# Patient Record
Sex: Male | Born: 1960 | Race: White | Hispanic: No | Marital: Married | Smoking: Former smoker
Health system: Southern US, Community
[De-identification: ages and names within clinical notes are randomized; demographics above are authoritative.]

## PROBLEM LIST (undated history)

## (undated) DIAGNOSIS — I639 Cerebral infarction, unspecified: Secondary | ICD-10-CM

## (undated) DIAGNOSIS — E119 Type 2 diabetes mellitus without complications: Secondary | ICD-10-CM

## (undated) DIAGNOSIS — I2510 Atherosclerotic heart disease of native coronary artery without angina pectoris: Secondary | ICD-10-CM

## (undated) DIAGNOSIS — I519 Heart disease, unspecified: Secondary | ICD-10-CM

## (undated) DIAGNOSIS — C8580 Other specified types of non-Hodgkin lymphoma, unspecified site: Secondary | ICD-10-CM

## (undated) DIAGNOSIS — B1920 Unspecified viral hepatitis C without hepatic coma: Secondary | ICD-10-CM

## (undated) DIAGNOSIS — I4891 Unspecified atrial fibrillation: Secondary | ICD-10-CM

## (undated) DIAGNOSIS — R9431 Abnormal electrocardiogram [ECG] [EKG]: Secondary | ICD-10-CM

## (undated) DIAGNOSIS — R9439 Abnormal result of other cardiovascular function study: Secondary | ICD-10-CM

## (undated) DIAGNOSIS — I10 Essential (primary) hypertension: Secondary | ICD-10-CM

## (undated) DIAGNOSIS — G629 Polyneuropathy, unspecified: Secondary | ICD-10-CM

## (undated) DIAGNOSIS — E785 Hyperlipidemia, unspecified: Secondary | ICD-10-CM

## (undated) HISTORY — PX: TONSILLECTOMY AND ADENOIDECTOMY: SUR1326

## (undated) HISTORY — DX: Essential (primary) hypertension: I10

## (undated) HISTORY — PX: APPENDECTOMY: SHX54

## (undated) HISTORY — DX: Abnormal result of other cardiovascular function study: R94.39

## (undated) HISTORY — DX: Unspecified atrial fibrillation: I48.91

## (undated) HISTORY — DX: Cerebral infarction, unspecified: I63.9

---
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
## 2011-05-31 ENCOUNTER — Emergency Department: Payer: Self-pay | Admitting: Emergency Medicine

## 2011-05-31 ENCOUNTER — Ambulatory Visit: Payer: Self-pay | Admitting: General Practice

## 2011-06-01 ENCOUNTER — Ambulatory Visit: Payer: Self-pay | Admitting: General Practice

## 2013-10-25 ENCOUNTER — Emergency Department: Payer: Self-pay | Admitting: Emergency Medicine

## 2013-10-25 LAB — COMPREHENSIVE METABOLIC PANEL
Alkaline Phosphatase: 82 U/L (ref 50–136)
Anion Gap: 5 — ABNORMAL LOW (ref 7–16)
BUN: 20 mg/dL — ABNORMAL HIGH (ref 7–18)
Co2: 27 mmol/L (ref 21–32)
EGFR (Non-African Amer.): 56 — ABNORMAL LOW
Glucose: 182 mg/dL — ABNORMAL HIGH (ref 65–99)
SGOT(AST): 32 U/L (ref 15–37)
SGPT (ALT): 40 U/L (ref 12–78)
Sodium: 138 mmol/L (ref 136–145)
Total Protein: 7.3 g/dL (ref 6.4–8.2)

## 2013-10-25 LAB — CBC
HGB: 15.3 g/dL (ref 13.0–18.0)
MCHC: 34.7 g/dL (ref 32.0–36.0)
RBC: 5.02 10*6/uL (ref 4.40–5.90)
RDW: 12.6 % (ref 11.5–14.5)
WBC: 8.7 10*3/uL (ref 3.8–10.6)

## 2013-10-25 LAB — URINALYSIS, COMPLETE
Bilirubin,UR: NEGATIVE
Blood: NEGATIVE
Glucose,UR: 50 mg/dL (ref 0–75)
Leukocyte Esterase: NEGATIVE
Nitrite: NEGATIVE
Ph: 5 (ref 4.5–8.0)
Protein: NEGATIVE
RBC,UR: NONE SEEN /HPF (ref 0–5)
Specific Gravity: 1.019 (ref 1.003–1.030)
WBC UR: 1 /HPF (ref 0–5)

## 2014-09-27 ENCOUNTER — Ambulatory Visit: Payer: Self-pay | Admitting: General Practice

## 2015-02-05 IMAGING — US ABDOMEN ULTRASOUND
1 series · 14 of 25 positions shown · non-contrast
Comparison: CT scan of October 25, 2013.

CLINICAL DATA: Acute right upper quadrant abdominal pain.
Hepatitis-C.

EXAM:
ULTRASOUND ABDOMEN COMPLETE

[Series 1: abdomen ultrasound · 0.33mm/px · 14 of 91 slices shown]
[im 1/91]
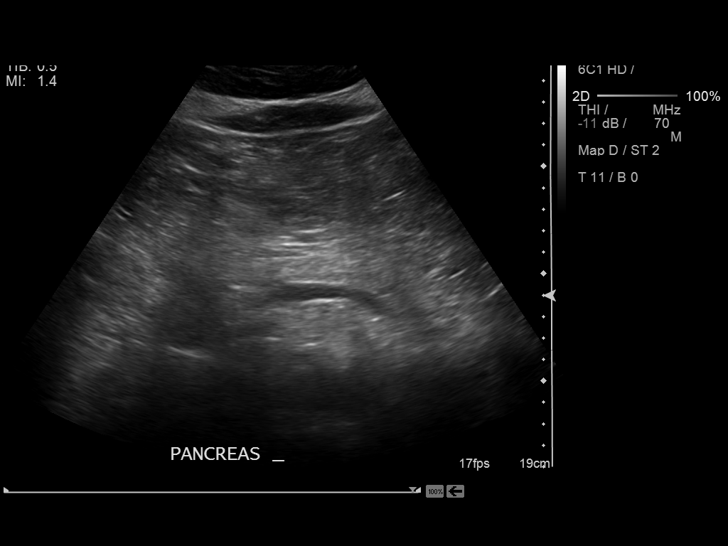
[im 8/91]
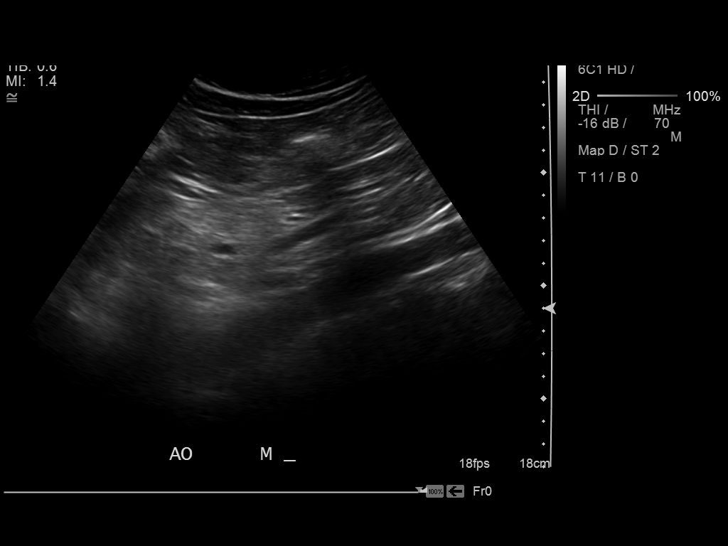
[im 16/91]
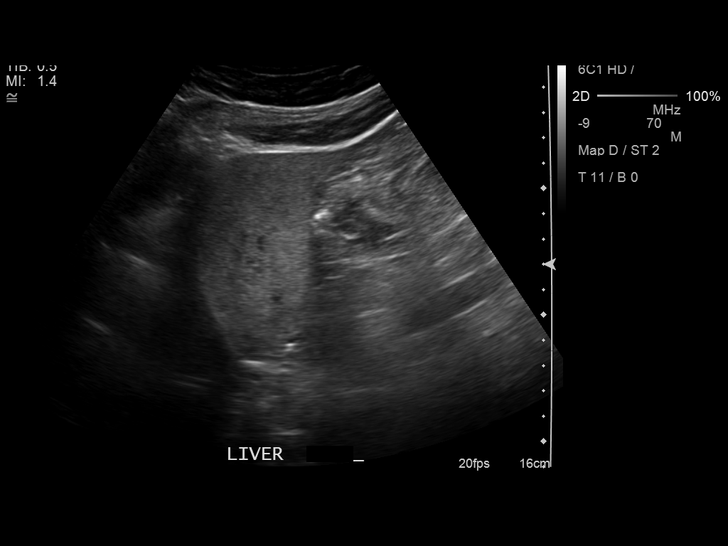
[im 23/91]
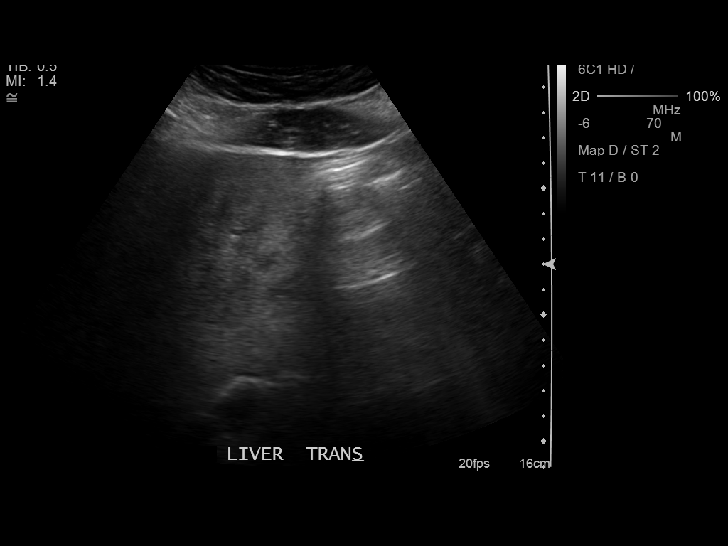
[im 31/91]
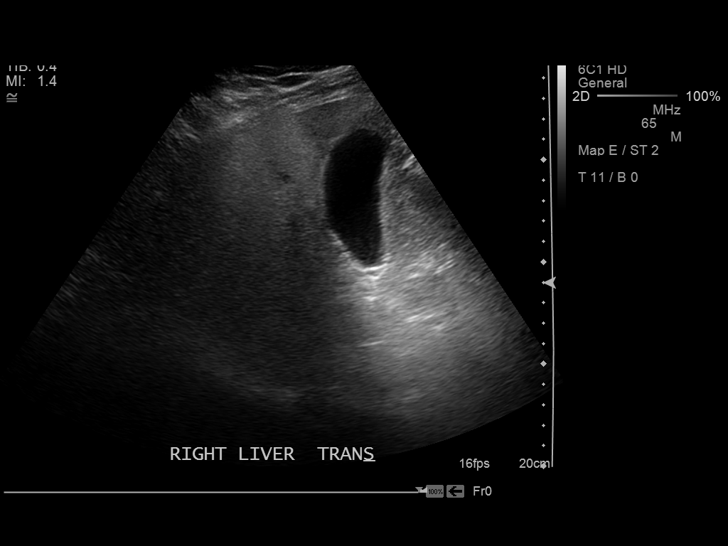
[im 34/91]
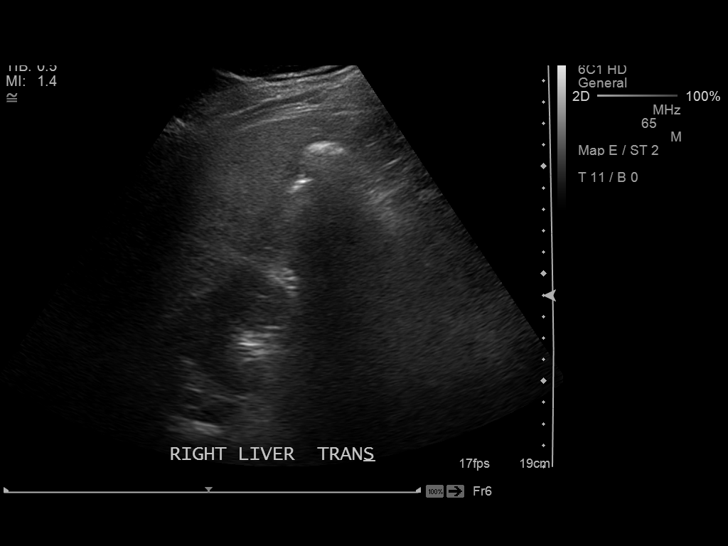
[im 42/91]
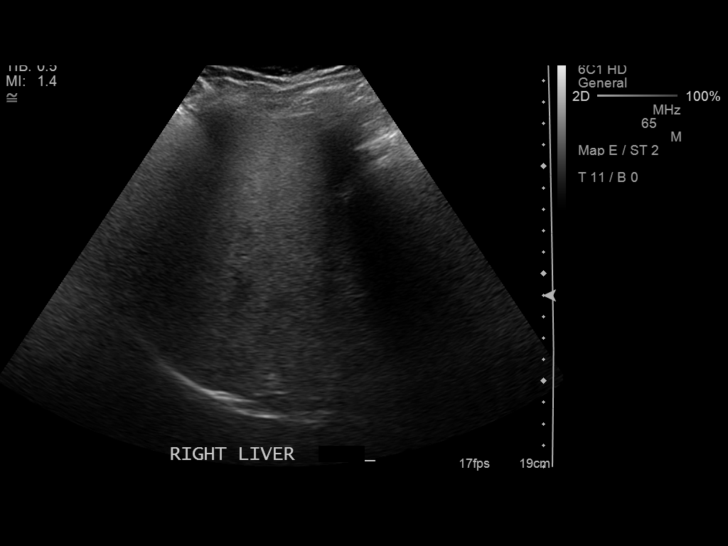
[im 49/91]
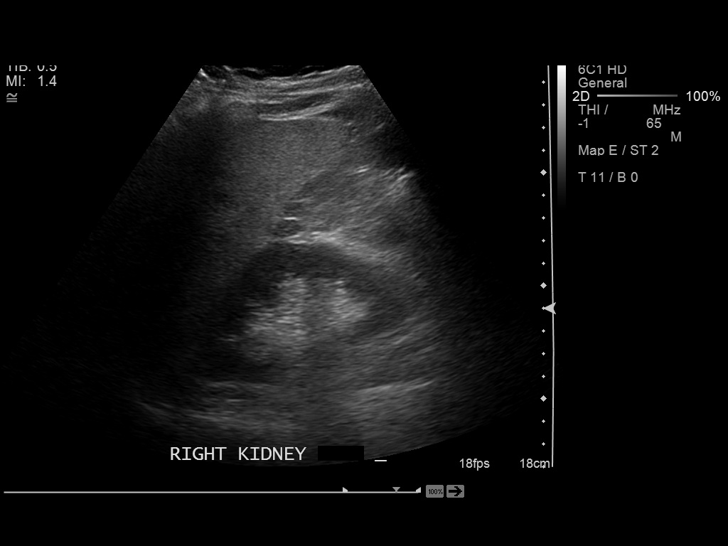
[im 57/91]
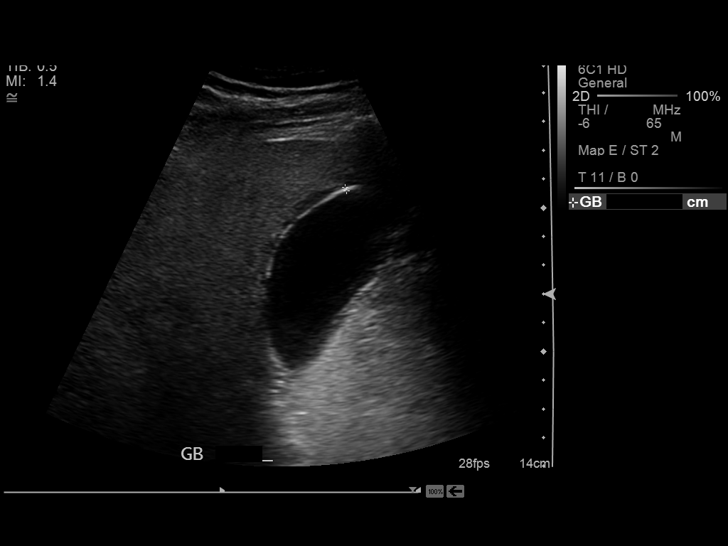
[im 61/91]
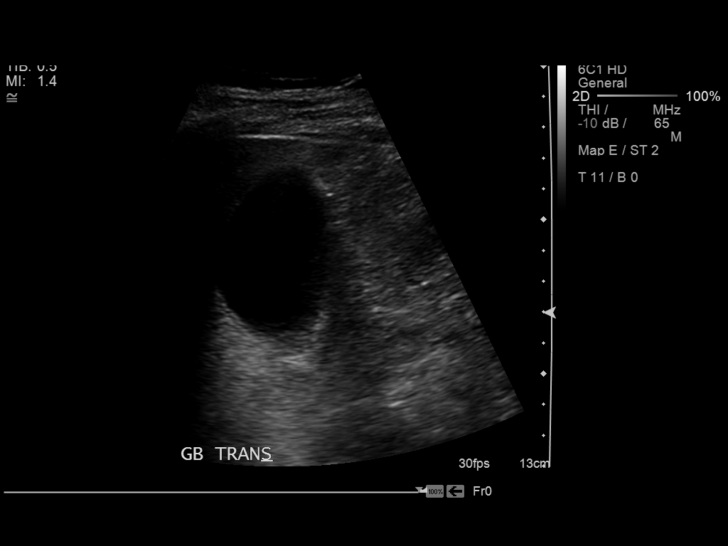
[im 68/91]
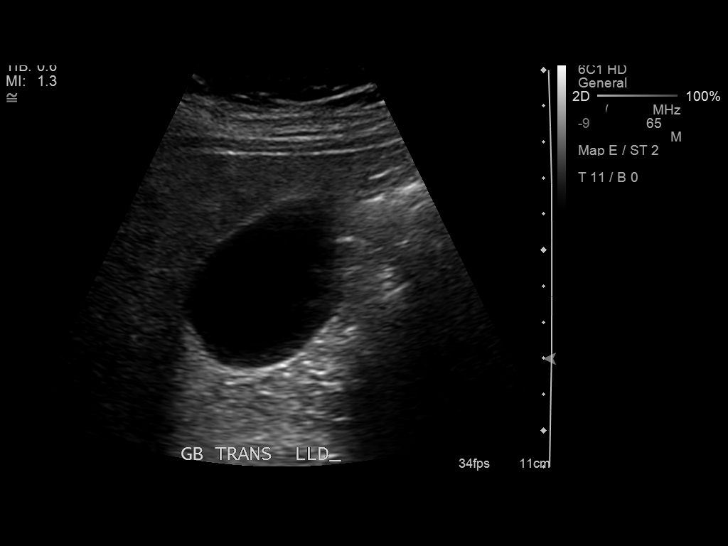
[im 76/91]
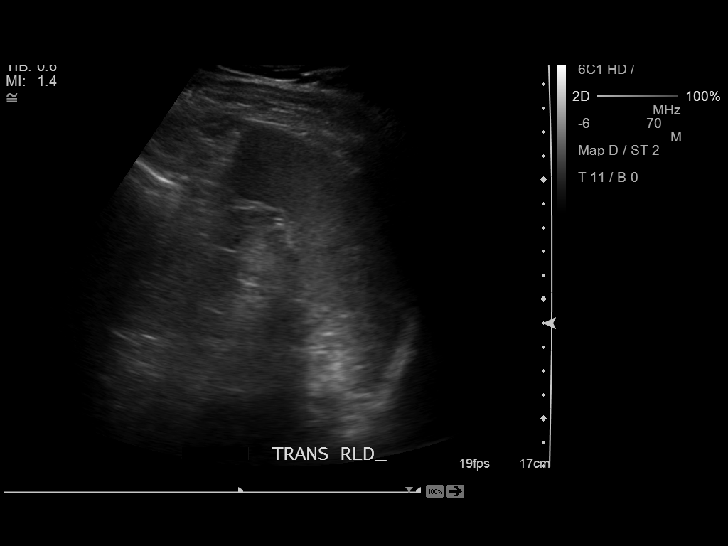
[im 83/91]
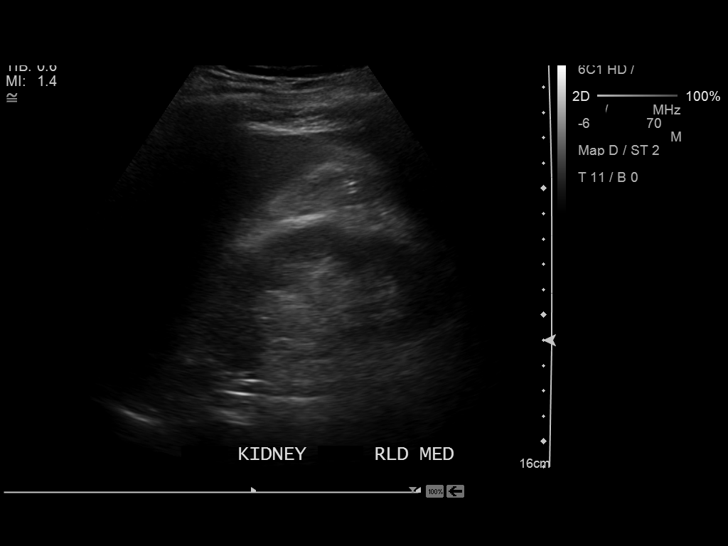
[im 91/91]
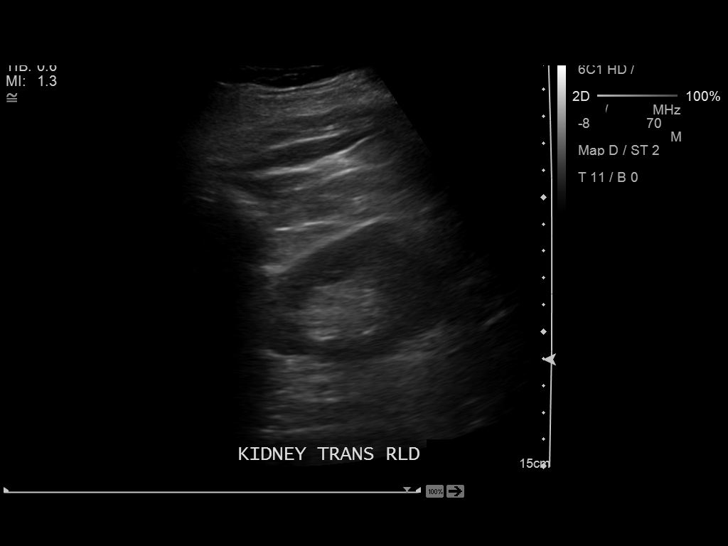

[14 of 25 positions shown; findings below may reference images not displayed]

FINDINGS: Gallbladder: No gallstones or wall thickening visualized. No
sonographic Murphy sign noted.

Common bile duct: Diameter: 2.8 mm which is within normal limits.

Liver: No focal lesion identified. Increased echogenicity is noted
consistent with fatty infiltration.

IVC: No abnormality visualized.

Pancreas: Visualized portion unremarkable.

Spleen: Size and appearance within normal limits.

Right Kidney: Length: 10.9 cm. Echogenicity within normal limits. No
mass or hydronephrosis visualized.

Left Kidney: Length: 10.8 cm. Echogenicity within normal limits. No
mass or hydronephrosis visualized.

Abdominal aorta: No aneurysm visualized.

Other findings: None.
IMPRESSION: Mildly increased echogenicity of hepatic parenchyma is noted
consistent with fatty infiltration or other diffuse hepatocellular
disease. No other abnormality is noted in the abdomen.

## 2015-06-16 DIAGNOSIS — C8330 Diffuse large B-cell lymphoma, unspecified site: Secondary | ICD-10-CM | POA: Diagnosis present

## 2016-01-09 ENCOUNTER — Ambulatory Visit: Payer: Self-pay | Admitting: Physician Assistant

## 2016-01-09 VITALS — BP 120/80 | HR 79 | Temp 97.9°F

## 2016-01-09 DIAGNOSIS — Z299 Encounter for prophylactic measures, unspecified: Secondary | ICD-10-CM

## 2016-01-09 NOTE — Progress Notes (Signed)
Patient ID: Charles Hickman, male   DOB: 11/12/1961, 55 y.o.   MRN: [id number] Patient came in to get his Flu shot and a TB Skin test only.

## 2016-01-12 LAB — TB SKIN TEST
Induration: 0 mm
TB Skin Test: NEGATIVE

## 2017-10-03 ENCOUNTER — Encounter: Payer: Self-pay | Admitting: Family Medicine

## 2017-10-03 ENCOUNTER — Ambulatory Visit (INDEPENDENT_AMBULATORY_CARE_PROVIDER_SITE_OTHER): Payer: Managed Care, Other (non HMO) | Admitting: Family Medicine

## 2017-10-03 VITALS — BP 124/84 | HR 88 | Temp 97.9°F | Ht 71.0 in | Wt 237.6 lb

## 2017-10-03 DIAGNOSIS — E119 Type 2 diabetes mellitus without complications: Secondary | ICD-10-CM | POA: Insufficient documentation

## 2017-10-03 DIAGNOSIS — F439 Reaction to severe stress, unspecified: Secondary | ICD-10-CM | POA: Insufficient documentation

## 2017-10-03 DIAGNOSIS — C8330 Diffuse large B-cell lymphoma, unspecified site: Secondary | ICD-10-CM | POA: Diagnosis not present

## 2017-10-03 DIAGNOSIS — R079 Chest pain, unspecified: Secondary | ICD-10-CM | POA: Diagnosis not present

## 2017-10-03 DIAGNOSIS — E1142 Type 2 diabetes mellitus with diabetic polyneuropathy: Secondary | ICD-10-CM

## 2017-10-03 DIAGNOSIS — Z8572 Personal history of non-Hodgkin lymphomas: Secondary | ICD-10-CM | POA: Insufficient documentation

## 2017-10-03 DIAGNOSIS — I10 Essential (primary) hypertension: Secondary | ICD-10-CM | POA: Insufficient documentation

## 2017-10-03 DIAGNOSIS — Z8579 Personal history of other malignant neoplasms of lymphoid, hematopoietic and related tissues: Secondary | ICD-10-CM | POA: Insufficient documentation

## 2017-10-03 MED ORDER — LISINOPRIL 10 MG PO TABS: 5.0000 mg | ORAL_TABLET | Freq: Two times a day (BID) | ORAL | 1 refills | Status: DC

## 2017-10-03 NOTE — Assessment & Plan Note (Signed)
History of elevated A1c in the past. Not on medications. We'll check an A1c in the determine need for medication. Also discussed need for statin therapy given diabetes history though he deferred this until labs have resulted.

## 2017-10-03 NOTE — Assessment & Plan Note (Signed)
Patient with a single episode of chest pain. No other symptoms with this. Has not recurred. EKG concerning given Q waves noted. We'll refer to cardiology for consideration of stress test. He will continue aspirin daily. Given return precautions.

## 2017-10-03 NOTE — Assessment & Plan Note (Signed)
Patient with stress regarding his father's illness. Some anxiety and depression with this. No SI. Discussed monitoring and discussed potential treatment options should this get worse.

## 2017-10-03 NOTE — Assessment & Plan Note (Signed)
Followed by oncology. He'll continue to follow with them.

## 2017-10-03 NOTE — Patient Instructions (Signed)
Nice to see you. We will check lab work. This should be done fasting. We'll get you to see cardiology. If you have recurrent chest pain or trouble breathing please be evaluated immediately.

## 2017-10-03 NOTE — Progress Notes (Signed)
Tommi Rumps, MD Phone: [phone]  Charles Hickman is a 56 y.o. male who presents today for new patient visit.  Hypertension: Typically in the 120s over 70s to 80s. Takes lisinopril 5 mg twice daily. No shortness of breath or edema.  Patient does note an episode of chest pain back in May. He went to his oncologist for follow-up of his lymphoma and they recommended he have a stress test given the type of chemotherapy he had. He has not had any recurrence of this. Notes it was a dull aching sensation. No shortness of breath with it. No radiation.  Patient is dealing with some stress with a little bit of anxiety and depression as well related to his father being hospitalized and very sick. He feels this is a normal reaction to what has been going on. He notes no SI.  History of elevated A1c. Was on metformin previously. This caused diarrhea. Has not been on any medication. Last A1c was about a year ago.  He's been followed by oncology at [facility] for [place]. He sees them every 6 months. He underwent chemotherapy. He notes no weight loss, night sweats, or itching.  Active Ambulatory Problems    Diagnosis Date Noted  . Hypertension 10/03/2017  . Diabetes ([place]) 10/03/2017  . Chest pain 10/03/2017  . 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 Stress 10/03/2017   Resolved Ambulatory Problems    Diagnosis Date Noted  . No Resolved Ambulatory Problems   Past Medical History:  Diagnosis Date  . Hx of lymphoma   . Hypertension   . Stroke [facility])     Family History  Problem Relation Age of Onset  . Hypertension Mother   . Hyperlipidemia Mother   . Stroke Mother   . Diabetes Mother   . Kidney disease Father   . Hypertension Father   . Diabetes Father   . Dementia Maternal Grandmother     Social History   Social History  . Marital status: Married    Spouse name: N/A  . Number of children: N/A  . Years of education: N/A   Occupational History  . Not on file.   Social  History Main Topics  . Smoking status: Current Every Day Smoker  . Smokeless tobacco: Never Used  . Alcohol use Not on file  . Drug use: Unknown  . Sexual activity: Not on file   Other Topics Concern  . Not on file   Social History Narrative  . No narrative on file    ROS  General:  Negative for nexplained weight loss, fever Skin: Negative for new or changing mole, sore that won't heal HEENT: Negative for trouble hearing, trouble seeing, ringing in ears, mouth sores, hoarseness, change in voice, dysphagia. CV:  Positive for chest pain, negative for dyspnea, edema, palpitations Resp: Negative for cough, dyspnea, hemoptysis GI: Negative for nausea, vomiting, diarrhea, constipation, abdominal pain, melena, hematochezia. GU: Negative for dysuria, incontinence, urinary hesitance, hematuria, vaginal or penile discharge, polyuria, sexual difficulty, lumps in testicle or breasts MSK: Negative for muscle cramps or aches, joint pain or swelling Neuro: Negative for headaches, weakness, numbness, dizziness, passing out/fainting Psych: Positive for depression, anxiety, negative for memory problems  Objective  Physical Exam Vitals:   10/03/17 0914  BP: 124/84  Pulse: 88  Temp: 97.9 F (36.6 C)  SpO2: 98%    BP Readings from Last 3 Encounters:  10/03/17 124/84  01/09/16 120/80   Wt Readings from Last 3 Encounters:  10/03/17 237 lb  9.6 oz (107.8 kg)    Physical Exam  Constitutional: No distress.  HENT:  Head: Normocephalic and atraumatic.  Mouth/Throat: Oropharynx is clear and moist. No oropharyngeal exudate.  Eyes: Pupils are equal, round, and reactive to light. Conjunctivae are normal.  Cardiovascular: Normal rate, regular rhythm and normal heart sounds.   Pulmonary/Chest: Effort normal and breath sounds normal.  Abdominal: Soft. Bowel sounds are normal. He exhibits no distension. There is no tenderness. There is no rebound and no guarding.  Musculoskeletal: He exhibits no  edema.  Neurological: He is alert. Gait normal.  Skin: Skin is warm and dry. He is not diaphoretic.  Psychiatric:  Mood stressed, affect flat   EKG: Normal sinus rhythm, rate 83, inferior and anterior lateral Q waves noted  Assessment/Plan:   Hypertension At goal. Continue current medication. Prescription given for lab work.  Diabetes ([place]) History of elevated A1c in the past. Not on medications. We'll check an A1c in the determine need for medication. Also discussed need for statin therapy given diabetes history though he deferred this until labs have resulted.  Chest pain Patient with a single episode of chest pain. No other symptoms with this. Has not recurred. EKG concerning given Q waves noted. We'll refer to cardiology for consideration of stress test. He will continue aspirin daily. Given return precautions.  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 He'll continue to follow with them.  Stress Patient with stress regarding his father's illness. Some anxiety and depression with this. No SI. Discussed monitoring and discussed potential treatment options should this get worse.   Orders Placed This Encounter  Procedures  . Ambulatory referral to Cardiology    Referral Priority:   Routine    Referral Type:   Consultation    Referral Reason:   Specialty Services Required    Requested Specialty:   Cardiology    Number of Visits Requested:   1  . EKG 12-Lead    Meds ordered this encounter  Medications  . DISCONTD: lisinopril (PRINIVIL,ZESTRIL) 10 MG tablet    Sig: Take 10 mg by mouth.  Marland Kitchen aspirin EC 81 MG tablet    Sig: Take 81 mg by mouth daily.  Marland Kitchen lisinopril (PRINIVIL,ZESTRIL) 10 MG tablet    Sig: Take 0.5 tablets (5 mg total) by mouth 2 (two) times daily.    Dispense:  90 tablet    Refill:  1    Tommi Rumps, MD [place]

## 2017-10-03 NOTE — Assessment & Plan Note (Signed)
At goal. Continue current medication. Prescription given for lab work.

## 2017-10-05 ENCOUNTER — Other Ambulatory Visit: Payer: Self-pay

## 2017-10-05 DIAGNOSIS — Z299 Encounter for prophylactic measures, unspecified: Secondary | ICD-10-CM

## 2017-10-05 NOTE — Progress Notes (Signed)
Patient came in to have blood drawn for testing per Dr. Ellen Henri orders.

## 2017-10-06 LAB — COMPREHENSIVE METABOLIC PANEL
A/G RATIO: 2.2 (ref 1.2–2.2)
ALT: 28 IU/L (ref 0–44)
AST: 20 IU/L (ref 0–40)
Albumin: 4.7 g/dL (ref 3.5–5.5)
Alkaline Phosphatase: 64 IU/L (ref 39–117)
BUN/Creatinine Ratio: 11 (ref 9–20)
BUN: 11 mg/dL (ref 6–24)
Bilirubin Total: 0.4 mg/dL (ref 0.0–1.2)
CALCIUM: 9.6 mg/dL (ref 8.7–10.2)
CO2: 23 mmol/L (ref 20–29)
CREATININE: 1.02 mg/dL (ref 0.76–1.27)
Chloride: 101 mmol/L (ref 96–106)
GFR, EST AFRICAN AMERICAN: 93 mL/min/{1.73_m2} (ref >59)
GFR, EST NON AFRICAN AMERICAN: 81 mL/min/{1.73_m2} (ref >59)
GLOBULIN, TOTAL: 2.1 g/dL (ref 1.5–4.5)
Glucose: 199 mg/dL — ABNORMAL HIGH (ref 65–99)
POTASSIUM: 5 mmol/L (ref 3.5–5.2)
SODIUM: 140 mmol/L (ref 134–144)
TOTAL PROTEIN: 6.8 g/dL (ref 6.0–8.5)

## 2017-10-06 LAB — LIPID PANEL
CHOL/HDL RATIO: 5.8 ratio — AB (ref 0.0–5.0)
Cholesterol, Total: 216 mg/dL — ABNORMAL HIGH (ref 100–199)
HDL: 37 mg/dL — AB (ref >39)
LDL CALC: 116 mg/dL — AB (ref 0–99)
TRIGLYCERIDES: 316 mg/dL — AB (ref 0–149)
VLDL Cholesterol Cal: 63 mg/dL — ABNORMAL HIGH (ref 5–40)

## 2017-10-06 LAB — HGB A1C W/O EAG: Hgb A1c MFr Bld: 8.6 % — ABNORMAL HIGH (ref 4.8–5.6)

## 2017-10-11 ENCOUNTER — Telehealth: Payer: Self-pay | Admitting: Family Medicine

## 2017-10-11 NOTE — Telephone Encounter (Signed)
Pt called looking for an update on medication that was to be called in for him. Informed pt that we we are waiting for Dr. Caryl Bis. Please call pt when completed. Thank you!  Call pt @ [phone]

## 2017-10-11 NOTE — Telephone Encounter (Signed)
Please advise 

## 2017-10-12 ENCOUNTER — Other Ambulatory Visit: Payer: Self-pay | Admitting: Family Medicine

## 2017-10-12 MED ORDER — ROSUVASTATIN CALCIUM 20 MG PO TABS
20.0000 mg | ORAL_TABLET | Freq: Every day | ORAL | 3 refills | Status: DC
Start: 2017-10-12 — End: 2018-08-06

## 2017-10-12 MED ORDER — EMPAGLIFLOZIN 10 MG PO TABS: 10.0000 mg | ORAL_TABLET | Freq: Every day | ORAL | 3 refills | Status: DC

## 2017-10-12 NOTE — Telephone Encounter (Signed)
See result note.  

## 2017-10-13 ENCOUNTER — Other Ambulatory Visit: Payer: Self-pay | Admitting: Family Medicine

## 2017-10-13 MED ORDER — CLONAZEPAM 0.5 MG PO TABS: 0.2500 mg | ORAL_TABLET | Freq: Two times a day (BID) | ORAL | 0 refills | Status: DC | PRN

## 2017-11-14 NOTE — H&P (View-Only) (Signed)
Cardiology Office Note  Date:  11/15/2017   ID:  Charles Hickman, DOB [birth year], MRN [id number]  PCP:  Leone Haven, MD   Chief Complaint  Patient presents with  . Other    New patient. Referred by PCP for abnormal EKG. Meds reviewed verbally with patient.     HPI:  Charles Hickman is a 56 y.o. 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 Caryl Bis for consultation of his abnormal EKG  He reports having episode of chest pain back in May 2017 Near syncope, diaphoresis Did not go to the hospital "chilled the rest of day" Symptoms resolved on their own without intervention  Review of previous history shows he had colonoscopy 2016 leading to diagnosis of lymphoma, followed by chemotherapy He had a PET CT x2 showing moderate aortic atherosclerosis No mention of coronary calcifications noted  He continues to smoke, poorly controlled diabetes, hyperlipidemia Recently started on Crestor, Jardiance 10 pound weight loss after recent loss of his father in the past 6 weeks  No further shortness of breath or chest pain on exertion  Previously was on metformin previously. This caused diarrhea.   Other results reviewed with him in detail today Echo during chemo  07/2015 Normal left ventricular systolic function, ejection fraction > 55%  Aortic sclerosis  Normal right ventricular systolic function  Lab work reviewed with him Total chol 216, LDL 116 On crestor 20  Other symptoms include Neuropathy in legs, made worse by chemotherapy for the patient  EKG personally reviewed by myself on todays visit Shows normal sinus rhythm rate 90 bpm old inferior MI Q waves T wave inversions 3 and aVF lead II also Q waves in V3 through V6 PVCs noted   PMH:   has a past medical history of  lymphoma, Hypertension, and Stroke ([place]).  PSH:    Past Surgical History:  Procedure Laterality Date  . APPENDECTOMY    . TONSILLECTOMY AND ADENOIDECTOMY      Current Outpatient Medications  Medication Sig Dispense Refill  . aspirin EC 81 MG tablet Take 81 mg by mouth daily.    . empagliflozin (JARDIANCE) 10 MG TABS tablet Take 10 mg by mouth daily. 30 tablet 3  . lisinopril (PRINIVIL,ZESTRIL) 10 MG tablet Take 0.5 tablets (5 mg total) by mouth 2 (two) times daily. 90 tablet 1  . rosuvastatin (CRESTOR) 20 MG tablet Take 1 tablet (20 mg total) by mouth daily. 90 tablet 3   No current facility-administered medications for this visit.      Allergies:   Patient has no known allergies.   Social History:  The patient  reports that he has been smoking.  He has been smoking about 0.50 packs per day. he has never used smokeless tobacco.   Family History:   family history includes Dementia in his maternal grandmother; Diabetes in his father and mother; Hyperlipidemia in his mother; Hypertension in his father and mother; Kidney disease in his father; Stroke in his mother.    Review of Systems: Review of Systems  Constitutional: Negative.   Respiratory: Negative.   Cardiovascular: Negative.        Remote episode of chest pain  Gastrointestinal: Negative.   Musculoskeletal: Negative.   Neurological: Negative.   Psychiatric/Behavioral: Negative.   All other systems reviewed  and are negative.    PHYSICAL EXAM: VS:  BP 130/72 (BP Location: Left Arm, Patient Position: Sitting, Cuff Size: Large)   Ht 5\' 10"  (1.778 m)   Wt 227 lb (103 kg)   BMI 32.57 kg/m  , BMI Body mass index is 32.57 kg/m. GEN: Well nourished, well developed, in no acute distress  HEENT: normal  Neck: no JVD, carotid bruits, or masses Cardiac: RRR; no murmurs, rubs, or gallops,no edema  Respiratory:  clear to auscultation bilaterally, normal work of breathing GI: soft, nontender, nondistended, + BS MS: no  deformity or atrophy  Skin: warm and dry, no rash Neuro:  Strength and sensation are intact Psych: euthymic mood, full affect    Recent Labs: 10/05/2017: ALT 28; BUN 11; Creatinine, Ser 1.02; Potassium 5.0; Sodium 140    Lipid Panel Lab Results  Component Value Date   CHOL 216 (H) 10/05/2017   HDL 37 (L) 10/05/2017   LDLCALC 116 (H) 10/05/2017   TRIG 316 (H) 10/05/2017      Wt Readings from Last 3 Encounters:  11/15/17 227 lb (103 kg)  10/03/17 237 lb 9.6 oz (107.8 kg)       ASSESSMENT AND PLAN:  Chest pain, unspecified type - Plan: EKG 12-Lead, NM Myocar Multi W/Spect W/Wall Motion / EF Episode of chest pain May 2017 concerning for MI Now with abnormal EKG Discussed anginal symptoms to watch for Stress test scheduled as below  Type 2 diabetes mellitus with diabetic polyneuropathy, without long-term current use of insulin ([place]) - Plan: EKG 12-Lead Hemoglobin A1c elevated greater than 8, stressed importance of taking his medication, diet modification, regular exercise  Diffuse large B-cell lymphoma, unspecified body region [facility]) - Plan: EKG 12-Lead Received 6 rounds of chemotherapy Starting 2016,  Has periodic follow-up with oncology, scheduled next week  Essential hypertension - Plan: EKG 12-Lead, NM Myocar Multi W/Spect W/Wall Motion / EF Blood pressure is well controlled on today's visit. No changes made to the medications.  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 Caryl Bis and will be referred back to his office for ongoing care of the issues detailed above   Orders Placed  This Encounter  Procedures  . NM Myocar Multi W/Spect W/Wall Motion / EF  . EKG 12-Lead     Signed, Esmond Plants, M.D., Ph.D. 11/15/2017  [place], [place]

## 2017-11-14 NOTE — Progress Notes (Signed)
Cardiology Office Note  Date:  11/15/2017   ID:  Charles Hickman, DOB 12-Jan-1961, MRN [id number]  PCP:  Leone Haven, MD   Chief Complaint  Patient presents with  . Other    New patient. Referred by PCP for abnormal EKG. Meds reviewed verbally with patient.     HPI:  Charles Hickman is a 56 y.o. 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 Caryl Bis for consultation of his abnormal EKG  He reports having episode of chest pain back in May 2017 Near syncope, diaphoresis Did not go to the hospital "chilled the rest of day" Symptoms resolved on their own without intervention  Review of previous history shows he had colonoscopy 2016 leading to diagnosis of lymphoma, followed by chemotherapy He had a PET CT x2 showing moderate aortic atherosclerosis No mention of coronary calcifications noted  He continues to smoke, poorly controlled diabetes, hyperlipidemia Recently started on Crestor, Jardiance 10 pound weight loss after recent loss of his father in the past 6 weeks  No further shortness of breath or chest pain on exertion  Previously was on metformin previously. This caused diarrhea.   Other results reviewed with him in detail today Echo during chemo  07/2015 Normal left ventricular systolic function, ejection fraction > 55%  Aortic sclerosis  Normal right ventricular systolic function  Lab work reviewed with him Total chol 216, LDL 116 On crestor 20  Other symptoms include Neuropathy in legs, made worse by chemotherapy for the patient  EKG personally reviewed by myself on todays visit Shows normal sinus rhythm rate 90 bpm old inferior MI Q waves T wave inversions 3 and aVF lead II also Q waves in V3 through V6 PVCs noted   PMH:   has a past medical history of  lymphoma, Hypertension, and Stroke ([place]).  PSH:    Past Surgical History:  Procedure Laterality Date  . APPENDECTOMY    . TONSILLECTOMY AND ADENOIDECTOMY      Current Outpatient Medications  Medication Sig Dispense Refill  . aspirin EC 81 MG tablet Take 81 mg by mouth daily.    . empagliflozin (JARDIANCE) 10 MG TABS tablet Take 10 mg by mouth daily. 30 tablet 3  . lisinopril (PRINIVIL,ZESTRIL) 10 MG tablet Take 0.5 tablets (5 mg total) by mouth 2 (two) times daily. 90 tablet 1  . rosuvastatin (CRESTOR) 20 MG tablet Take 1 tablet (20 mg total) by mouth daily. 90 tablet 3   No current facility-administered medications for this visit.      Allergies:   Patient has no known allergies.   Social History:  The patient  reports that he has been smoking.  He has been smoking about 0.50 packs per day. he has never used smokeless tobacco.   Family History:   family history includes Dementia in his maternal grandmother; Diabetes in his father and mother; Hyperlipidemia in his mother; Hypertension in his father and mother; Kidney disease in his father; Stroke in his mother.    Review of Systems: Review of Systems  Constitutional: Negative.   Respiratory: Negative.   Cardiovascular: Negative.        Remote episode of chest pain  Gastrointestinal: Negative.   Musculoskeletal: Negative.   Neurological: Negative.   Psychiatric/Behavioral: Negative.   All other systems reviewed  and are negative.    PHYSICAL EXAM: VS:  BP 130/72 (BP Location: Left Arm, Patient Position: Sitting, Cuff Size: Large)   Ht 5\' 10"  (1.778 m)   Wt 227 lb (103 kg)   BMI 32.57 kg/m  , BMI Body mass index is 32.57 kg/m. GEN: Well nourished, well developed, in no acute distress  HEENT: normal  Neck: no JVD, carotid bruits, or masses Cardiac: RRR; no murmurs, rubs, or gallops,no edema  Respiratory:  clear to auscultation bilaterally, normal work of breathing GI: soft, nontender, nondistended, + BS MS: no  deformity or atrophy  Skin: warm and dry, no rash Neuro:  Strength and sensation are intact Psych: euthymic mood, full affect    Recent Labs: 10/05/2017: ALT 28; BUN 11; Creatinine, Ser 1.02; Potassium 5.0; Sodium 140    Lipid Panel Lab Results  Component Value Date   CHOL 216 (H) 10/05/2017   HDL 37 (L) 10/05/2017   LDLCALC 116 (H) 10/05/2017   TRIG 316 (H) 10/05/2017      Wt Readings from Last 3 Encounters:  11/15/17 227 lb (103 kg)  10/03/17 237 lb 9.6 oz (107.8 kg)       ASSESSMENT AND PLAN:  Chest pain, unspecified type - Plan: EKG 12-Lead, NM Myocar Multi W/Spect W/Wall Motion / EF Episode of chest pain May 2017 concerning for MI Now with abnormal EKG Discussed anginal symptoms to watch for Stress test scheduled as below  Type 2 diabetes mellitus with diabetic polyneuropathy, without long-term current use of insulin ([place]) - Plan: EKG 12-Lead Hemoglobin A1c elevated greater than 8, stressed importance of taking his medication, diet modification, regular exercise  Diffuse large B-cell lymphoma, unspecified body region [facility]) - Plan: EKG 12-Lead Received 6 rounds of chemotherapy Starting 2016,  Has periodic follow-up with oncology, scheduled next week  Essential hypertension - Plan: EKG 12-Lead, NM Myocar Multi W/Spect W/Wall Motion / EF Blood pressure is well controlled on today's visit. No changes made to the medications.  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 Caryl Bis and will be referred back to his office for ongoing care of the issues detailed above   Orders Placed  This Encounter  Procedures  . NM Myocar Multi W/Spect W/Wall Motion / EF  . EKG 12-Lead     Signed, Esmond Plants, M.D., Ph.D. 11/15/2017  [place], [place]

## 2017-11-15 ENCOUNTER — Emergency Department: Payer: Managed Care, Other (non HMO)

## 2017-11-15 ENCOUNTER — Encounter: Payer: Self-pay | Admitting: Cardiovascular Disease

## 2017-11-15 ENCOUNTER — Encounter: Payer: Self-pay | Admitting: Emergency Medicine

## 2017-11-15 ENCOUNTER — Emergency Department
Admission: EM | Admit: 2017-11-15 | Discharge: 2017-11-16 | Disposition: A | Discharge status: Home / Self Care | Payer: Managed Care, Other (non HMO) | Attending: Emergency Medicine | Admitting: Emergency Medicine

## 2017-11-15 ENCOUNTER — Ambulatory Visit: Payer: Managed Care, Other (non HMO) | Admitting: Cardiovascular Disease

## 2017-11-15 VITALS — BP 130/72 | Ht 70.0 in | Wt 227.0 lb

## 2017-11-15 DIAGNOSIS — F17210 Nicotine dependence, cigarettes, uncomplicated: Secondary | ICD-10-CM | POA: Diagnosis not present

## 2017-11-15 DIAGNOSIS — Z7982 Long term (current) use of aspirin: Secondary | ICD-10-CM | POA: Diagnosis not present

## 2017-11-15 DIAGNOSIS — R9431 Abnormal electrocardiogram [ECG] [EKG]: Secondary | ICD-10-CM

## 2017-11-15 DIAGNOSIS — Z7984 Long term (current) use of oral hypoglycemic drugs: Secondary | ICD-10-CM | POA: Diagnosis not present

## 2017-11-15 DIAGNOSIS — E1142 Type 2 diabetes mellitus with diabetic polyneuropathy: Secondary | ICD-10-CM

## 2017-11-15 DIAGNOSIS — C8330 Diffuse large B-cell lymphoma, unspecified site: Secondary | ICD-10-CM

## 2017-11-15 DIAGNOSIS — I10 Essential (primary) hypertension: Secondary | ICD-10-CM | POA: Diagnosis not present

## 2017-11-15 DIAGNOSIS — Z79899 Other long term (current) drug therapy: Secondary | ICD-10-CM | POA: Diagnosis not present

## 2017-11-15 DIAGNOSIS — R079 Chest pain, unspecified: Secondary | ICD-10-CM

## 2017-11-15 DIAGNOSIS — R002 Palpitations: Secondary | ICD-10-CM | POA: Diagnosis not present

## 2017-11-15 DIAGNOSIS — E119 Type 2 diabetes mellitus without complications: Secondary | ICD-10-CM | POA: Diagnosis not present

## 2017-11-15 HISTORY — DX: Abnormal electrocardiogram (ECG) (EKG): R94.31

## 2017-11-15 LAB — BASIC METABOLIC PANEL
ANION GAP: 12 (ref 5–15)
BUN: 18 mg/dL (ref 6–20)
CALCIUM: 8.9 mg/dL (ref 8.9–10.3)
CO2: 19 mmol/L — AB (ref 22–32)
Chloride: 104 mmol/L (ref 101–111)
Creatinine, Ser: 1.05 mg/dL (ref 0.61–1.24)
Glucose, Bld: 221 mg/dL — ABNORMAL HIGH (ref 65–99)
POTASSIUM: 4.1 mmol/L (ref 3.5–5.1)
Sodium: 135 mmol/L (ref 135–145)

## 2017-11-15 LAB — CBC
HEMATOCRIT: 44.5 % (ref 40.0–52.0)
HEMOGLOBIN: 15.5 g/dL (ref 13.0–18.0)
MCH: 30.9 pg (ref 26.0–34.0)
MCHC: 35 g/dL (ref 32.0–36.0)
MCV: 88.3 fL (ref 80.0–100.0)
Platelets: 237 10*3/uL (ref 150–440)
RBC: 5.04 MIL/uL (ref 4.40–5.90)
RDW: 13 % (ref 11.5–14.5)
WBC: 9.6 10*3/uL (ref 3.8–10.6)

## 2017-11-15 LAB — TROPONIN I

## 2017-11-15 NOTE — Patient Instructions (Addendum)
Medication Instructions:   No medication changes made  Labwork:  No new labs needed  Testing/Procedures:  We will schedule a treadmill myoview at [facility] No food the morning of the test NO smoking for 24 hours before the test No caffeine 24 hours before the test [place]  Your caregiver has ordered a Stress Test with nuclear imaging. The purpose of this test is to evaluate the blood supply to your heart muscle. This procedure is referred to as a "Non-Invasive Stress Test." This is because other than having an IV started in your vein, nothing is inserted or "invades" your body. Cardiac stress tests are done to find areas of poor blood flow to the heart by determining the extent of coronary artery disease (CAD). Some patients exercise on a treadmill, which naturally increases the blood flow to your heart, while others who are  unable to walk on a treadmill due to physical limitations have a pharmacologic/chemical stress agent called Lexiscan . This medicine will mimic walking on a treadmill by temporarily increasing your coronary blood flow.   Please note: these test may take anywhere between 2-4 hours to complete  PLEASE REPORT TO [facility] AT THE FIRST DESK WILL DIRECT YOU WHERE TO GO  Date of Procedure:______12/7/18_________  Arrival Time for Procedure:______07:45 am _________  Instructions regarding medication:   You may take your usual morning medications with a small sip of water the morning of the procedure.   PLEASE NOTIFY THE OFFICE AT LEAST 11 HOURS IN ADVANCE IF YOU ARE UNABLE TO KEEP YOUR APPOINTMENT.  [phone] AND  PLEASE NOTIFY NUCLEAR MEDICINE AT [facility] AT LEAST 24 HOURS IN ADVANCE IF YOU ARE UNABLE TO KEEP YOUR APPOINTMENT. [phone]  How to prepare for your Myoview test:  1. Do not eat or drink after midnight 2. No caffeine for 24 hours prior to test 3. No smoking 24 hours prior to test. 4. Your medication may be taken  with water.  If your doctor stopped a medication because of this test, do not take that medication. 5. Ladies, please do not wear dresses.  Skirts or pants are appropriate. Please wear a short sleeve shirt. 6. No perfume, cologne or lotion. 7. Wear comfortable walking shoes. No heels!     Follow-Up: It was a pleasure seeing you in the office today. Please call us if you have new issues that need to be addressed before your next appt.  [phone]  Your physician wants you to follow-up in:  As needed    Cardiac Nuclear Scan A cardiac nuclear scan is a test that measures blood flow to the heart when a person is resting and when he or she is exercising. The test looks for problems such as:  Not enough blood reaching a portion of the heart.  The heart muscle not working normally.  You may need this test if:  You have heart disease.  You have had abnormal lab results.  You have had heart surgery or angioplasty.  You have chest pain.  You have shortness of breath.  In this test, a radioactive dye (tracer) is injected into your bloodstream. After the tracer has traveled to your heart, an imaging device is used to measure how much of the tracer is absorbed by or distributed to various areas of your heart. This procedure is usually done at a hospital and takes 2-4 hours. Tell a health care provider about:  Any allergies you have.  All medicines you are taking,  including vitamins, herbs, eye drops, creams, and over-the-counter medicines.  Any problems you or family members have had with the use of anesthetic medicines.  Any blood disorders you have.  Any surgeries you have had.  Any medical conditions you have.  Whether you are pregnant or may be pregnant. What are the risks? Generally, this is a safe procedure. However, problems may occur, including:  Serious chest pain and heart attack. This is only a risk if the stress portion of the test is done.  Rapid  heartbeat.  Sensation of warmth in your chest. This usually passes quickly.  What happens before the procedure?  Ask your health care provider about changing or stopping your regular medicines. This is especially important if you are taking diabetes medicines or blood thinners.  Remove your jewelry on the day of the procedure. What happens during the procedure?  An IV tube will be inserted into one of your veins.  Your health care provider will inject a small amount of radioactive tracer through the tube.  You will wait for 20-40 minutes while the tracer travels through your bloodstream.  Your heart activity will be monitored with an electrocardiogram (ECG).  You will lie down on an exam table.  Images of your heart will be taken for about 15-20 minutes.  You may be asked to exercise on a treadmill or stationary bike. While you exercise, your heart's activity will be monitored with an ECG, and your blood pressure will be checked. If you are unable to exercise, you may be given a medicine to increase blood flow to parts of your heart.  When blood flow to your heart has peaked, a tracer will again be injected through the IV tube.  After 20-40 minutes, you will get back on the exam table and have more images taken of your heart.  When the procedure is over, your IV tube will be removed. The procedure may vary among health care providers and hospitals. Depending on the type of tracer used, scans may need to be repeated 3-4 hours later. What happens after the procedure?  Unless your health care provider tells you otherwise, you may return to your normal schedule, including diet, activities, and medicines.  Unless your health care provider tells you otherwise, you may increase your fluid intake. This will help flush the contrast dye from your body. Drink enough fluid to keep your urine clear or pale yellow.  It is up to you to get your test results. Ask your health care provider, or  the department that is doing the test, when your results will be ready. Summary  A cardiac nuclear scan measures the blood flow to the heart when a person is resting and when he or she is exercising.  You may need this test if you are at risk for heart disease.  Tell your health care provider if you are pregnant.  Unless your health care provider tells you otherwise, increase your fluid intake. This will help flush the contrast dye from your body. Drink enough fluid to keep your urine clear or pale yellow. This information is not intended to replace advice given to you by your health care provider. Make sure you discuss any questions you have with your health care provider. Document Released: 12/31/2004 Document Revised: 12/08/2016 Document Reviewed: 11/14/2013 Elsevier Interactive Patient Education  2017 [employer].

## 2017-11-15 NOTE — ED Triage Notes (Signed)
Pt reports he had sudden onset of palpitations this evening at approximately 1800. Pt reports he checked his pulse and his heart rate was "not normal." Pt denies SOB, N/V with episode. Pt has stress test scheduled for next Friday from previous cardiologist appointment this AM. Pt denies diagnosis of arrhythmia.

## 2017-11-15 NOTE — ED Notes (Signed)
Pt reports erratic heartbeat earlier tonight. Pt states episode lasted for several hours.   No chest pain or sob. Pt saw dr Candis Musa today.  Pt has stress test scheduled for next week.  Pt denies n/v/.  No diaphoresis.  Pt alert.  nsr on monitor.  Skin warm and dry.  Family with pt.

## 2017-11-16 ENCOUNTER — Telehealth: Payer: Self-pay | Admitting: Cardiovascular Disease

## 2017-11-16 LAB — MAGNESIUM: MAGNESIUM: 2.3 mg/dL (ref 1.7–2.4)

## 2017-11-16 LAB — TROPONIN I: Troponin I: <0.03 ng/mL (ref <0.03)

## 2017-11-16 LAB — TSH: TSH: 1.401 u[IU]/mL (ref 0.350–4.500)

## 2017-11-16 NOTE — Telephone Encounter (Signed)
Spoke with patient and he states that yesterday after leaving our office he had some strange erratic beats. He went to ED and all testing was normal and EKG showed 1 PVC and otherwise normal. He denied having any chest pain and denied being short of breath. Patient is scheduled to have stress test coming up and reviewed date, time, instructions, and location of testing. Advised him to continue monitoring and reviewed signs and symptoms that would require immediate evaluation in the ED. He was appreciative for the call back and had no further questions at this time.

## 2017-11-16 NOTE — Telephone Encounter (Signed)
Pt states he had been running errands. States when he arrived at home he had a "strange feeling, and my pulse was very erratic." States his pulse was "all over the place". He states this lasted for several hours. States he went to the ED, but by then his pulse was back to normal. Denies any CP, states he was slightly dizzy. Please call.

## 2017-11-16 NOTE — Discharge Instructions (Signed)
Please follow-up with your cardiologist.

## 2017-11-16 NOTE — ED Provider Notes (Signed)
[facility] Emergency Department Provider Note   ____________________________________________   First MD Initiated Contact with Patient 11/15/17 2326     (approximate)  I have reviewed the triage vital signs and the nursing notes.   HISTORY  Chief Complaint Chest Pain    HPI Charles Hickman is a 56 y.o. male who comes into the hospital today with some palpitations.  The patient reports that around 3-330 this afternoon he noticed that his heart beat became irregular.  He reports that he did not have any pain but his chest felt weird.  The patient states that his wife returned home and they put on his blood pressure cuff and listened to his heart rate.  He reports that they confirmed that it was irregular so they decided to come into the hospital.  Patient states that by the time he arrived to the hospital his heart rate was regular again.  The patient states that this happened about a year ago but it only lasted about 15 minutes.  Today it was 4 hours or so.  The patient states that he saw Dr. Rockey Situ the cardiologist earlier today.  He is recently started some new diabetic medications as well as Crestor.  The patient states that he had a Coke this afternoon and he normally drinks 1 cup of coffee daily.  He denies any shortness of breath or nausea.  He had some mild dizziness.  He is here today for evaluation.   Past Medical History:  Diagnosis Date  . Hx of lymphoma   . Hypertension   . Stroke [facility])     Patient Active Problem List   Diagnosis Date Noted  . Abnormal EKG 11/15/2017  . Hypertension 10/03/2017  . Diabetes ([place]) 10/03/2017  . Chest pain 10/03/2017  . 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 Stress 10/03/2017    Past Surgical History:  Procedure Laterality Date  . APPENDECTOMY    . TONSILLECTOMY AND ADENOIDECTOMY      Prior to Admission medications   Medication Sig Start Date End Date Taking? Authorizing Provider  aspirin EC 81  MG tablet Take 81 mg by mouth daily.    [provider]  empagliflozin (JARDIANCE) 10 MG TABS tablet Take 10 mg by mouth daily. 10/12/17   Leone Haven, MD  lisinopril (PRINIVIL,ZESTRIL) 10 MG tablet Take 0.5 tablets (5 mg total) by mouth 2 (two) times daily. 10/03/17   Leone Haven, MD  rosuvastatin (CRESTOR) 20 MG tablet Take 1 tablet (20 mg total) by mouth daily. 10/12/17   Leone Haven, MD    Allergies Patient has no known allergies.  Family History  Problem Relation Age of Onset  . Hypertension Mother   . Hyperlipidemia Mother   . Stroke Mother   . Diabetes Mother   . Kidney disease Father   . Hypertension Father   . Diabetes Father   . Dementia Maternal Grandmother     Social History Social History   Tobacco Use  . Smoking status: Current Every Day Smoker    Packs/day: 0.50  . Smokeless tobacco: Never Used  Substance Use Topics  . Alcohol use: Not on file  . Drug use: Not on file    Review of Systems  Constitutional: No fever/chills Eyes: No visual changes. ENT: No sore throat. Cardiovascular: Palpitations Respiratory: Denies shortness of breath. Gastrointestinal: No abdominal pain.  No nausea, no vomiting.  No diarrhea.  No constipation. Genitourinary: Negative for dysuria. Musculoskeletal: Negative for  back pain. Skin: Negative for rash. Neurological: Negative for headaches, focal weakness or numbness.   ____________________________________________   PHYSICAL EXAM:  VITAL SIGNS: ED Triage Vitals  Enc Vitals Group     BP 11/15/17 2040 117/80     Pulse Rate 11/15/17 2040 100     Resp 11/15/17 2040 16     Temp 11/15/17 2040 97.7 F (36.5 C)     Temp Source 11/15/17 2040 Oral     SpO2 11/15/17 2040 100 %     Weight 11/15/17 2034 227 lb (103 kg)     Height --      Head Circumference --      Peak Flow --      Pain Score --      Pain Loc --      Pain Edu? --      Excl. in [place]? --     Constitutional: Alert and oriented.  Well appearing and in mild distress. Eyes: Conjunctivae are normal. PERRL. EOMI. Head: Atraumatic. Nose: No congestion/rhinnorhea. Mouth/Throat: Mucous membranes are moist.  Oropharynx non-erythematous. Cardiovascular: Normal rate, regular rhythm. Grossly normal heart sounds.  Good peripheral circulation. Respiratory: Normal respiratory effort.  No retractions. Lungs CTAB. Gastrointestinal: Soft and nontender. No distention.  Positive bowel sounds Musculoskeletal: No lower extremity tenderness nor edema.  Neurologic:  Normal speech and language.  Skin:  Skin is warm, dry and intact.  Psychiatric: Mood and affect are normal.   ____________________________________________   LABS (all labs ordered are listed, but only abnormal results are displayed)  Labs Reviewed  BASIC METABOLIC PANEL - Abnormal; Notable for the following components:      Result Value   CO2 19 (*)    Glucose, Bld 221 (*)    All other components within normal limits  CBC  TROPONIN I  TROPONIN I  MAGNESIUM  TSH   ____________________________________________  EKG  ED ECG REPORT I, Loney Hering, the attending physician, personally viewed and interpreted this ECG.   Date: 11/15/2017  EKG Time: 2033  Rate: 94  Rhythm: normal sinus rhythm with sinus arrythmia  Axis: 2033  Intervals:none  ST&T Change: none  ____________________________________________  RADIOLOGY  Dg Chest 2 View  Result Date: 11/15/2017 CLINICAL DATA:  Sudden onset of palpitations. EXAM: CHEST  2 VIEW COMPARISON:  None. FINDINGS: Both lungs are clear. Heart and mediastinum are within normal limits. No pleural effusions. Trachea is midline. No acute bone abnormality. IMPRESSION: No active cardiopulmonary disease. Electronically Signed   By: Markus Daft M.D.   On: 11/15/2017 21:03    ____________________________________________   PROCEDURES  Procedure(s) performed: None  Procedures  Critical Care performed:  No  ____________________________________________   INITIAL IMPRESSION / ASSESSMENT AND PLAN / ED COURSE  As part of my medical decision making, I reviewed the following data within the electronic MEDICAL RECORD NUMBER Notes from prior ED visits and [place] Controlled Substance Database   This is a 56 year old male who comes into the hospital today with some palpitations.  My differential diagnosis includes premature ventricular contractions, atrial fibrillation, tachycardia.  The patient's EKG did show some sinus arrhythmia with premature ventricular contraction present.  The patient had some blood work drawn including a CBC BMP in 2 troponins which were negative.  The patient's TSH and magnesium is also unremarkable.  The patient had a chest x-ray which was negative.  At this time I still feel that he may have had a premature ventricular contraction.  We discussed increasing his hydration and  decreasing his caffeine intake.  I also encouraged the patient to follow back up with his cardiologist for further evaluation of his symptoms.  He will be discharged home to follow-up.      ____________________________________________   FINAL CLINICAL IMPRESSION(S) / ED DIAGNOSES  Final diagnoses:  Palpitations     ED Discharge Orders    None       Note:  This document was prepared using Dragon voice recognition software and may include unintentional dictation errors.    Loney Hering, MD 11/16/17 [id number]

## 2017-11-25 ENCOUNTER — Encounter
Admission: RE | Admit: 2017-11-25 | Discharge: 2017-11-25 | Disposition: A | Discharge status: Home / Self Care | Payer: Managed Care, Other (non HMO) | Source: Ambulatory Visit | Attending: Cardiovascular Disease | Admitting: Cardiovascular Disease

## 2017-11-25 DIAGNOSIS — I10 Essential (primary) hypertension: Secondary | ICD-10-CM | POA: Diagnosis not present

## 2017-11-25 DIAGNOSIS — R079 Chest pain, unspecified: Secondary | ICD-10-CM | POA: Diagnosis not present

## 2017-11-25 LAB — NM MYOCAR MULTI W/SPECT W/WALL MOTION / EF
CHL CUP MPHR: 162 {beats}/min
CHL CUP NUCLEAR SDS: 5
CHL CUP NUCLEAR SRS: 8
CSEPED: 5 min
Estimated workload: 7 METS
Exercise duration (sec): 46 s
LVDIAVOL: 93 mL (ref 62–150)
LVSYSVOL: 48 mL
NUC STRESS TID: 0.91
Peak HR: 137 {beats}/min
Percent HR: 84 %
Rest HR: 77 {beats}/min
SSS: 13

## 2017-11-25 MED ORDER — TECHNETIUM TC 99M TETROFOSMIN IV KIT
32.4520 | PACK | Freq: Once | INTRAVENOUS | Status: AC | PRN
  Administered 2017-11-25: 32.452 via INTRAVENOUS

## 2017-11-25 MED ORDER — TECHNETIUM TC 99M TETROFOSMIN IV KIT
13.0420 | PACK | Freq: Once | INTRAVENOUS | Status: AC | PRN
  Administered 2017-11-25: 13.042 via INTRAVENOUS

## 2017-12-01 ENCOUNTER — Telehealth: Payer: Self-pay | Admitting: *Deleted

## 2017-12-01 DIAGNOSIS — R9431 Abnormal electrocardiogram [ECG] [EKG]: Secondary | ICD-10-CM

## 2017-12-01 DIAGNOSIS — Z01812 Encounter for preprocedural laboratory examination: Secondary | ICD-10-CM

## 2017-12-01 NOTE — Telephone Encounter (Signed)
-----   Message from Minna Merritts, MD sent at 11/27/2017 10:22 AM EST ----- Stress test shows blockage bottom part of the heart Suspected may have started last year when he had episode of chest pain Uncertain based on the pictures whether this blockage can be opened up Would probably recommend cardiac catheterization to evaluate We can book office appointment to discuss and I can show him pictures or we could go ahead and book cardiac catheterization.

## 2017-12-01 NOTE — Telephone Encounter (Signed)
Spoke with patient and he states that he would just like to move forward with scheduling his procedure. He would like to wait until after Christmas if at all possible. Advised that I would check on scheduling and give him a call back. He was appreciative for the call with no further questions.

## 2017-12-02 ENCOUNTER — Encounter: Payer: Self-pay | Admitting: *Deleted

## 2017-12-02 NOTE — Telephone Encounter (Signed)
Spoke with patient and reviewed procedure instructions along with date, time, and location of his heart cath procedure. He verbalized understanding of all instructions and requested to have his labs done over at the [facility] building. Advised that I would place those lab slips up front and he requested that I send the procedure instructions through mychart. He was appreciative for the call and had no further questions or concerns at this time.

## 2017-12-02 NOTE — Telephone Encounter (Signed)
Spoke with patient and he wanted to inquire about scheduling his heart cath at different location. Reviewed who would be doing his procedure and that I would call scheduling and then be in touch with him about time and location. He was appreciative for the call and will discuss with scheduling.

## 2017-12-02 NOTE — Telephone Encounter (Signed)
[facility] Cardiac Cath Instructions  You are scheduled for a Cardiac Cath on:__Thursday December 27th___ Please arrive at _06:30_am on the day of your procedure Please expect a call from our [place] to pre-register you  Do not eat/drink anything after midnight Someone will need to drive you home It is recommended someone be with you for the first 24 hours after your procedure Wear clothes that are easy to get on/off and wear slip on shoes if possible   Medications bring a current list of all medications with you  _X__ You may take all other medications the morning of your procedure with enough water to swallow safely  _X__ Do not take these medications before your procedure:___Lisinopril or Jardiance the morning of your procedure.   Day of your procedure: Arrive at the [facility] entrance.  Free valet service is available.  After entering the [place] please check-in at the registration desk (1st desk on your right) to receive your armband. After receiving your armband someone will escort you to the cardiac cath/special procedures waiting area.  The usual length of stay after your procedure is about 2 to 3 hours.  This can vary.  If you have any questions, please call our office at [phone], or you may call the cardiac cath lab at [facility] directly at [phone]

## 2017-12-03 ENCOUNTER — Other Ambulatory Visit: Payer: Self-pay | Admitting: Cardiovascular Disease

## 2017-12-03 DIAGNOSIS — I200 Unstable angina: Secondary | ICD-10-CM

## 2017-12-05 ENCOUNTER — Encounter
Admission: RE | Disposition: A | Discharge status: Home / Self Care | Payer: Self-pay | Source: Ambulatory Visit | Attending: Cardiovascular Disease

## 2017-12-05 SURGERY — LEFT HEART CATH
Anesthesia: Moderate Sedation

## 2017-12-07 ENCOUNTER — Other Ambulatory Visit: Payer: Self-pay

## 2017-12-07 DIAGNOSIS — Z299 Encounter for prophylactic measures, unspecified: Secondary | ICD-10-CM

## 2017-12-07 NOTE — Progress Notes (Signed)
Patient came in to have blood drawn for testing per Dr. Caryl Bis and Dr. Donivan Scull orders.

## 2017-12-08 LAB — CBC WITH DIFFERENTIAL/PLATELET
BASOS ABS: 0 10*3/uL (ref 0.0–0.2)
Basos: 0 %
EOS (ABSOLUTE): 0.1 10*3/uL (ref 0.0–0.4)
Eos: 2 %
Hematocrit: 46.1 % (ref 37.5–51.0)
Hemoglobin: 15.8 g/dL (ref 13.0–17.7)
Immature Grans (Abs): 0 10*3/uL (ref 0.0–0.1)
Immature Granulocytes: 0 %
LYMPHS ABS: 1.9 10*3/uL (ref 0.7–3.1)
Lymphs: 31 %
MCH: 31 pg (ref 26.6–33.0)
MCHC: 34.3 g/dL (ref 31.5–35.7)
MCV: 90 fL (ref 79–97)
MONOS ABS: 0.4 10*3/uL (ref 0.1–0.9)
Monocytes: 7 %
NEUTROS ABS: 3.6 10*3/uL (ref 1.4–7.0)
Neutrophils: 60 %
PLATELETS: 175 10*3/uL (ref 150–379)
RBC: 5.1 x10E6/uL (ref 4.14–5.80)
RDW: 13.4 % (ref 12.3–15.4)
WBC: 6 10*3/uL (ref 3.4–10.8)

## 2017-12-08 LAB — COMPREHENSIVE METABOLIC PANEL
A/G RATIO: 2.3 — AB (ref 1.2–2.2)
ALBUMIN: 4.5 g/dL (ref 3.5–5.5)
ALK PHOS: 54 IU/L (ref 39–117)
ALT: 29 IU/L (ref 0–44)
AST: 19 IU/L (ref 0–40)
BILIRUBIN TOTAL: 0.7 mg/dL (ref 0.0–1.2)
BUN / CREAT RATIO: 15 (ref 9–20)
BUN: 13 mg/dL (ref 6–24)
CHLORIDE: 104 mmol/L (ref 96–106)
CO2: 21 mmol/L (ref 20–29)
Calcium: 9.3 mg/dL (ref 8.7–10.2)
Creatinine, Ser: 0.87 mg/dL (ref 0.76–1.27)
GFR calc non Af Amer: 95 mL/min/{1.73_m2} (ref >59)
GFR, EST AFRICAN AMERICAN: 110 mL/min/{1.73_m2} (ref >59)
Globulin, Total: 2 g/dL (ref 1.5–4.5)
Glucose: 161 mg/dL — ABNORMAL HIGH (ref 65–99)
POTASSIUM: 4.5 mmol/L (ref 3.5–5.2)
Sodium: 142 mmol/L (ref 134–144)
TOTAL PROTEIN: 6.5 g/dL (ref 6.0–8.5)

## 2017-12-08 LAB — LDL CHOLESTEROL, DIRECT: LDL DIRECT: 63 mg/dL (ref 0–99)

## 2017-12-08 LAB — PROTIME-INR
INR: 1 (ref 0.8–1.2)
PROTHROMBIN TIME: 10.4 s (ref 9.1–12.0)

## 2017-12-14 ENCOUNTER — Telehealth: Payer: Self-pay | Admitting: Cardiovascular Disease

## 2017-12-14 NOTE — Telephone Encounter (Signed)
Patient is scheduled for CATH tomorrow morning Calling to make sure he is cleared and ready to go for tomorrow morning Please call

## 2017-12-14 NOTE — Telephone Encounter (Signed)
Per Dorian Pod, insurance approved 12/27 cardiac catheterization. Reviewed pre-procedure instructions w/pt who verbalized understanding.

## 2017-12-14 NOTE — Telephone Encounter (Signed)
Pt inquires if insurance pre-authorization approval has been received for tomorrow's cath. Routed to Dorian Pod for an update.

## 2017-12-15 ENCOUNTER — Encounter
Admission: RE | Disposition: A | Discharge status: Home / Self Care | Payer: Self-pay | Source: Ambulatory Visit | Attending: Cardiovascular Disease

## 2017-12-15 ENCOUNTER — Ambulatory Visit
Admission: RE | Admit: 2017-12-15 | Discharge: 2017-12-16 | Disposition: A | Discharge status: Home / Self Care | Payer: Managed Care, Other (non HMO) | Source: Ambulatory Visit | Attending: Cardiovascular Disease | Admitting: Cardiovascular Disease

## 2017-12-15 ENCOUNTER — Encounter: Payer: Self-pay | Admitting: *Deleted

## 2017-12-15 DIAGNOSIS — Z8579 Personal history of other malignant neoplasms of lymphoid, hematopoietic and related tissues: Secondary | ICD-10-CM | POA: Diagnosis present

## 2017-12-15 DIAGNOSIS — Z7982 Long term (current) use of aspirin: Secondary | ICD-10-CM | POA: Insufficient documentation

## 2017-12-15 DIAGNOSIS — Z8249 Family history of ischemic heart disease and other diseases of the circulatory system: Secondary | ICD-10-CM | POA: Insufficient documentation

## 2017-12-15 DIAGNOSIS — Z8673 Personal history of transient ischemic attack (TIA), and cerebral infarction without residual deficits: Secondary | ICD-10-CM | POA: Insufficient documentation

## 2017-12-15 DIAGNOSIS — F17219 Nicotine dependence, cigarettes, with unspecified nicotine-induced disorders: Secondary | ICD-10-CM

## 2017-12-15 DIAGNOSIS — G620 Drug-induced polyneuropathy: Secondary | ICD-10-CM | POA: Diagnosis not present

## 2017-12-15 DIAGNOSIS — I200 Unstable angina: Secondary | ICD-10-CM

## 2017-12-15 DIAGNOSIS — R9431 Abnormal electrocardiogram [ECG] [EKG]: Secondary | ICD-10-CM | POA: Insufficient documentation

## 2017-12-15 DIAGNOSIS — R079 Chest pain, unspecified: Secondary | ICD-10-CM | POA: Diagnosis present

## 2017-12-15 DIAGNOSIS — E785 Hyperlipidemia, unspecified: Secondary | ICD-10-CM | POA: Insufficient documentation

## 2017-12-15 DIAGNOSIS — E1165 Type 2 diabetes mellitus with hyperglycemia: Secondary | ICD-10-CM | POA: Insufficient documentation

## 2017-12-15 DIAGNOSIS — E1142 Type 2 diabetes mellitus with diabetic polyneuropathy: Secondary | ICD-10-CM | POA: Insufficient documentation

## 2017-12-15 DIAGNOSIS — E119 Type 2 diabetes mellitus without complications: Secondary | ICD-10-CM

## 2017-12-15 DIAGNOSIS — I2510 Atherosclerotic heart disease of native coronary artery without angina pectoris: Secondary | ICD-10-CM

## 2017-12-15 DIAGNOSIS — R002 Palpitations: Secondary | ICD-10-CM | POA: Insufficient documentation

## 2017-12-15 DIAGNOSIS — T451X5A Adverse effect of antineoplastic and immunosuppressive drugs, initial encounter: Secondary | ICD-10-CM | POA: Diagnosis not present

## 2017-12-15 DIAGNOSIS — I519 Heart disease, unspecified: Secondary | ICD-10-CM

## 2017-12-15 DIAGNOSIS — Z9221 Personal history of antineoplastic chemotherapy: Secondary | ICD-10-CM | POA: Insufficient documentation

## 2017-12-15 DIAGNOSIS — I119 Hypertensive heart disease without heart failure: Secondary | ICD-10-CM | POA: Diagnosis not present

## 2017-12-15 DIAGNOSIS — I2582 Chronic total occlusion of coronary artery: Secondary | ICD-10-CM | POA: Insufficient documentation

## 2017-12-15 DIAGNOSIS — F17210 Nicotine dependence, cigarettes, uncomplicated: Secondary | ICD-10-CM | POA: Diagnosis not present

## 2017-12-15 DIAGNOSIS — I208 Other forms of angina pectoris: Secondary | ICD-10-CM | POA: Diagnosis present

## 2017-12-15 DIAGNOSIS — Z79899 Other long term (current) drug therapy: Secondary | ICD-10-CM | POA: Diagnosis not present

## 2017-12-15 DIAGNOSIS — Z720 Tobacco use: Secondary | ICD-10-CM

## 2017-12-15 DIAGNOSIS — I25118 Atherosclerotic heart disease of native coronary artery with other forms of angina pectoris: Secondary | ICD-10-CM | POA: Diagnosis not present

## 2017-12-15 DIAGNOSIS — C8330 Diffuse large B-cell lymphoma, unspecified site: Secondary | ICD-10-CM | POA: Insufficient documentation

## 2017-12-15 DIAGNOSIS — R9439 Abnormal result of other cardiovascular function study: Secondary | ICD-10-CM

## 2017-12-15 DIAGNOSIS — I10 Essential (primary) hypertension: Secondary | ICD-10-CM

## 2017-12-15 HISTORY — DX: Heart disease, unspecified: I51.9

## 2017-12-15 HISTORY — DX: Type 2 diabetes mellitus without complications: E11.9

## 2017-12-15 HISTORY — DX: Atherosclerotic heart disease of native coronary artery without angina pectoris: I25.10

## 2017-12-15 HISTORY — DX: Polyneuropathy, unspecified: G62.9

## 2017-12-15 HISTORY — DX: Unspecified viral hepatitis C without hepatic coma: B19.20

## 2017-12-15 HISTORY — PX: LEFT HEART CATH AND CORONARY ANGIOGRAPHY: CATH118249

## 2017-12-15 HISTORY — DX: Other specified types of non-hodgkin lymphoma, unspecified site: C85.80

## 2017-12-15 HISTORY — PX: INTRAVASCULAR PRESSURE WIRE/FFR STUDY: CATH118243

## 2017-12-15 HISTORY — DX: Hyperlipidemia, unspecified: E78.5

## 2017-12-15 LAB — POCT ACTIVATED CLOTTING TIME
ACTIVATED CLOTTING TIME: 252 s
Activated Clotting Time: 257 seconds

## 2017-12-15 SURGERY — LEFT HEART CATH AND CORONARY ANGIOGRAPHY
Anesthesia: Moderate Sedation

## 2017-12-15 MED ORDER — ASPIRIN 81 MG PO CHEW: 81.0000 mg | CHEWABLE_TABLET | ORAL | Status: DC

## 2017-12-15 MED ORDER — SODIUM CHLORIDE 0.9% FLUSH
3.0000 mL | Freq: Two times a day (BID) | INTRAVENOUS | Status: DC
  Administered 2017-12-16: 3 mL via INTRAVENOUS

## 2017-12-15 MED ORDER — SODIUM CHLORIDE 0.9 % WEIGHT BASED INFUSION
3.0000 mL/kg/h | INTRAVENOUS | Status: AC
  Administered 2017-12-15: 3 mL/kg/h via INTRAVENOUS

## 2017-12-15 MED ORDER — LISINOPRIL 10 MG PO TABS
5.0000 mg | ORAL_TABLET | Freq: Two times a day (BID) | ORAL | Status: DC
  Administered 2017-12-16: 5 mg via ORAL
  Filled 2017-12-15 (×2): qty 1

## 2017-12-15 MED ORDER — ROSUVASTATIN CALCIUM 20 MG PO TABS
20.0000 mg | ORAL_TABLET | Freq: Every day | ORAL | Status: DC
  Administered 2017-12-16: 20 mg via ORAL
  Filled 2017-12-15 (×2): qty 1
  Filled 2017-12-15: qty 2

## 2017-12-15 MED ORDER — NICOTINE 14 MG/24HR TD PT24: 14.0000 mg | MEDICATED_PATCH | Freq: Every day | TRANSDERMAL | Status: DC

## 2017-12-15 MED ORDER — SODIUM CHLORIDE 0.9% FLUSH: 3.0000 mL | INTRAVENOUS | Status: DC | PRN

## 2017-12-15 MED ORDER — ACETAMINOPHEN 325 MG PO TABS: 650.0000 mg | ORAL_TABLET | ORAL | Status: DC | PRN

## 2017-12-15 MED ORDER — MIDAZOLAM HCL 2 MG/2ML IJ SOLN
INTRAMUSCULAR | Status: DC | PRN
  Administered 2017-12-15: 1 mg via INTRAVENOUS

## 2017-12-15 MED ORDER — IOPAMIDOL (ISOVUE-300) INJECTION 61%
INTRAVENOUS | Status: DC | PRN
  Administered 2017-12-15: 140 mL via INTRA_ARTERIAL

## 2017-12-15 MED ORDER — ONDANSETRON HCL 4 MG/2ML IJ SOLN: 4.0000 mg | Freq: Four times a day (QID) | INTRAMUSCULAR | Status: DC | PRN

## 2017-12-15 MED ORDER — SODIUM CHLORIDE 0.9 % WEIGHT BASED INFUSION
1.0000 mL/kg/h | INTRAVENOUS | Status: AC
  Administered 2017-12-15: 1 mL/kg/h via INTRAVENOUS

## 2017-12-15 MED ORDER — NICOTINE 14 MG/24HR TD PT24
14.0000 mg | MEDICATED_PATCH | Freq: Every day | TRANSDERMAL | Status: DC
  Administered 2017-12-15 – 2017-12-16 (×2): 14 mg via TRANSDERMAL
  Filled 2017-12-15 (×2): qty 1

## 2017-12-15 MED ORDER — CLOPIDOGREL BISULFATE 75 MG PO TABS
75.0000 mg | ORAL_TABLET | Freq: Every day | ORAL | Status: DC
  Administered 2017-12-16: 75 mg via ORAL
  Filled 2017-12-15: qty 1

## 2017-12-15 MED ORDER — ASPIRIN 81 MG PO CHEW
CHEWABLE_TABLET | ORAL | Status: DC | PRN
  Administered 2017-12-15: 243 mg via ORAL

## 2017-12-15 MED ORDER — FENTANYL CITRATE (PF) 100 MCG/2ML IJ SOLN
INTRAMUSCULAR | Status: DC | PRN
  Administered 2017-12-15: 25 ug via INTRAVENOUS
  Administered 2017-12-15: 50 ug via INTRAVENOUS

## 2017-12-15 MED ORDER — CLOPIDOGREL BISULFATE 75 MG PO TABS
ORAL_TABLET | ORAL | Status: DC | PRN
  Administered 2017-12-15: 600 mg via ORAL

## 2017-12-15 MED ORDER — HEPARIN SODIUM (PORCINE) 1000 UNIT/ML IJ SOLN
INTRAMUSCULAR | Status: DC | PRN
  Administered 2017-12-15: 5000 [IU] via INTRAVENOUS
  Administered 2017-12-15: 2000 [IU] via INTRAVENOUS
  Administered 2017-12-15: 4000 [IU] via INTRAVENOUS

## 2017-12-15 MED ORDER — NITROGLYCERIN 1 MG/10 ML FOR IR/CATH LAB
INTRA_ARTERIAL | Status: DC | PRN
  Administered 2017-12-15: 200 ug via INTRACORONARY

## 2017-12-15 MED ORDER — ASPIRIN EC 81 MG PO TBEC
81.0000 mg | DELAYED_RELEASE_TABLET | Freq: Every day | ORAL | Status: DC
  Administered 2017-12-16: 81 mg via ORAL
  Filled 2017-12-15 (×2): qty 1

## 2017-12-15 MED ORDER — SODIUM CHLORIDE 0.9 % WEIGHT BASED INFUSION: 1.0000 mL/kg/h | INTRAVENOUS | Status: DC

## 2017-12-15 MED ORDER — SODIUM CHLORIDE 0.9 % IV SOLN: 250.0000 mL | INTRAVENOUS | Status: DC | PRN

## 2017-12-15 MED ORDER — VERAPAMIL HCL 2.5 MG/ML IV SOLN
INTRAVENOUS | Status: DC | PRN
  Administered 2017-12-15: 2.5 mg via INTRAVENOUS

## 2017-12-15 SURGICAL SUPPLY — 13 items
BALLN ~~LOC~~ TREK RX 3.5X20 (BALLOONS) ×2
BALLOON ~~LOC~~ TREK RX 3.5X20 (BALLOONS) ×1 IMPLANT
CATH INFINITI 5FR ANG PIGTAIL (CATHETERS) ×2 IMPLANT
CATH OPTITORQUE JACKY 4.0 5F (CATHETERS) ×2 IMPLANT
CATH VISTA GUIDE 6FR XBLAD3.5 (CATHETERS) ×2 IMPLANT
DEVICE INFLAT 30 PLUS (MISCELLANEOUS) ×2 IMPLANT
DEVICE RAD TR BAND REGULAR (VASCULAR PRODUCTS) ×2 IMPLANT
GLIDESHEATH SLEND SS 6F .021 (SHEATH) ×2 IMPLANT
KIT MANI 3VAL PERCEP (MISCELLANEOUS) ×2 IMPLANT
PACK CARDIAC CATH (CUSTOM PROCEDURE TRAY) ×2 IMPLANT
STENT SIERRA 3.50 X 33 MM (Permanent Stent) ×2 IMPLANT
WIRE PRESSURE VERRATA (WIRE) ×2 IMPLANT
WIRE ROSEN-J .035X260CM (WIRE) ×2 IMPLANT

## 2017-12-15 NOTE — Progress Notes (Signed)
Progress Note  Patient Name: Charles Hickman Date of Encounter: 12/16/2017  Primary Cardiologist: Rockey Situ  Subjective   Outpatient diagnostic [place] on 12/15/17 with PCI/DES placed to LAD by Dr. Fletcher Anon, also with occluded distal RCA as detailed below. No further chest pain. No SOB. Has ambulated without issues. Tolerating medications without issues. Would like SL NTG and nicotine patches at discharge. Post-cath labs stable.   Inpatient Medications    Scheduled Meds: . aspirin EC  81 mg Oral Daily  . clopidogrel  75 mg Oral Q breakfast  . lisinopril  5 mg Oral BID  . nicotine  14 mg Transdermal Daily  . rosuvastatin  20 mg Oral Daily  . sodium chloride flush  3 mL Intravenous Q12H   Continuous Infusions: . sodium chloride     PRN Meds: sodium chloride, acetaminophen, ondansetron (ZOFRAN) IV, sodium chloride flush   Vital Signs    Vitals:   12/15/17 1920 12/15/17 1923 12/16/17 0323 12/16/17 0728  BP: 111/64  123/64 131/83  Pulse: 67 69 64 67  Resp: 17  17   Temp: 98.1 F (36.7 C)  97.7 F (36.5 C) 97.8 F (36.6 C)  TempSrc: Oral  Oral Oral  SpO2:  99% 100% 96%  Weight:      Height:        Intake/Output Summary (Last 24 hours) at 12/16/2017 0912 Last data filed at 12/16/2017 0300 Gross per 24 hour  Intake 1321.5 ml  Output 800 ml  Net 521.5 ml   Filed Weights   12/15/17 0652  Weight: 227 lb (103 kg)    Telemetry    NSR - Personally Reviewed  ECG    N/A  Physical Exam   GEN: No acute distress.   Neck: No JVD Cardiac: RRR, no murmurs, rubs, or gallops. Right radial cath site without bleeding, swelling, erythema, warmth, or TTP. Radial pulse 2+.  Respiratory: Clear to auscultation bilaterally. GI: Soft, nontender, non-distended  MS: No edema; No deformity. Neuro:  Nonfocal  Psych: Normal affect   Labs    Chemistry Recent Labs  Lab 12/16/17 0500  NA 137  K 4.1  CL 107  CO2 29  GLUCOSE 159*  BUN 10  CREATININE 0.79  CALCIUM 8.2*  GFRNONAA  >60  GFRAA >60  ANIONGAP 1*     Hematology Recent Labs  Lab 12/16/17 0500  WBC 6.2  RBC 5.06  HGB 15.3  HCT 45.1  MCV 89.0  MCH 30.2  MCHC 33.9  RDW 13.1  PLT 149*    Cardiac EnzymesNo results for input(s): TROPONINI in the last 168 hours. No results for input(s): TROPIPOC in the last 168 hours.   BNPNo results for input(s): BNP, PROBNP in the last 168 hours.   DDimer No results for input(s): DDIMER in the last 168 hours.   Radiology    No results found.  Cardiac Studies   Cath yesterday 11/15/2017  Mid RCA lesion is 90% stenosed.  Dist RCA lesion is 100% stenosed.  Prox LAD lesion is 75% stenosed.  There is mild left ventricular systolic dysfunction.  LV end diastolic pressure is normal.  The left ventricular ejection fraction is 45-50% by visual estimate.  A drug-eluting stent was successfully placed using a STENT SIERRA 3.50 X 33 MM.  Post intervention, there is a 0% residual stenosis.   1.  Significant two-vessel coronary artery disease with chronically occluded distal right coronary artery with left-to-right collaterals.  75% proximal LAD disease which was found to be significant  by fractional flow reserve at 0.74. 2.  Mildly reduced LV systolic function with an EF of 45-50% with inferior wall hypokinesis 3.  Successful drug-eluting stent placement to the proximal LAD.  Patient Profile     Charles Hickman a 56 y.o.malewith history of poorly controlled DM2, large cell lymphoma s/p chemotherapy with Cytoxan and R-CHOP residual peripheral neuropathy, HCV, HTN, HLD, CVA, and cartoid artery disease who presented to [facility] on 12/27 for outpatient diagnostic LHC as detailed above.   Assessment & Plan    1. Unstable angina/CAD in native coronary artery: -Currently chest pain free -Cath 12/15/2017 with 75% proximal LAD stenosis with FFR of 0.74 confirming significant lesion s/p PCI/DES. Also noted to have an occluded distal RCA with left-to-right  collaterals -Continue DAPT with aspirin and Plavix for at least the next months -Add Lopressor 12.5 mg bid -Crestor -Lisinopril -SL NTG prn -Cardiac rehab, declines this, reports he does not have time for cardiac rehab and will rehab on his own following his hospital follow up -Post-cardiac cath care discussed in detail   2. Systolic dysfunction: -He does not appear grossly volume overloaded -Add Lopressor as above -Lisinopril as above -No indication for standing PO Lasix at this time -CHF education -Consider outpatient limited echo in several months  3. Type 2 diabetes mellitus with diabetic polyneuropathy, without long-term current use of insulin: -HGB A1c elevated at 8.6 -We have encouraged continued exercise, careful diet management in an effort to lose weight -Continue Jardiance per PCP -Follow up with PCP  4. Diffuse large B-cell lymphoma: -Received Cytoxan and 6 rounds of R-CHOP  -Follows with oncology  5. Essential hypertension: -Blood pressure is well controlled -Medications as above  6. Hyperlipidemia: -Goal LDL less than 70 -Continue Crestor -Recommend rechecking lipid panel in ~ 6-8 weeks, if not at goal at that time, consider addition of Zetia vs PCSK9 inhibitor   7. Tobacco abuse: -Cessation advised -Prescribing nicotine patches at discharge    For questions or updates, please contact [place] Please consult [email or web address] for contact info under Cardiology/STEMI.      Signed, Christell Faith, PA-C  12/16/2017, 9:12 AM    Attending Note Patient seen and examined, agree with detailed note above,  Patient presentation and plan discussed on rounds.   No chest pain overnight  Reports having episodes of palpitations, tachycardia as outpatient, etiology unclear Has been smoking up until now wife present in the room with him,   On physical exam no JVD, lungs clear to auscultation bilaterally, heart sounds regular no murmurs appreciated, abdomen  soft nontender, no significant lower extremity edema  Lab work reviewed showing potassium 4.1, creatinine 0.79, hematocrit 45 TSH 1.4, LDL 63  A/P:  1. Unstable angina/CAD in native coronary artery: -Cath 12/15/2017 with 75% proximal LAD stenosis with FFR of 0.74 confirming significant lesion s/p PCI/DES. Also noted to have an occluded distal RCA with left-to-right collaterals -Continue DAPT with aspirin and Plavix for at least the next months  Lopressor 12.5 mg bid, Crestor, Lisinopril -SL NTG prn Per the patient, will rehab on his own following his hospital follow up -Post-cardiac cath care discussed in detail  , will take 2 weeks off  from heavy lifting, strongly recommended smoking cessation Cardiac catheterization results discussed with him in detail  2. Systolic dysfunction: -Lisinopril and metoprolol  3. Type 2 diabetes mellitus with diabetic polyneuropathy, without long-term current use of insulin: -HGB A1c elevated at 8.6 -We have encouraged continued exercise, careful diet management in an effort to  lose weight -Continue Jardiance per PCP  4. Diffuse large B-cell lymphoma: -Received Cytoxan and 6 rounds of R-CHOP  Followed by oncology  5. Essential hypertension: -Blood pressure is well controlled  6. Hyperlipidemia: -Goal LDL less than 70 recovery instructions provided -Continue Crestor  7. Tobacco abuse: -Cessation advised -Prescribing nicotine patches at discharge  8 palpitations.  Reports having several episodes of palpitations, etiology unclear Recommended if he has additional episodes on the metoprolol that he call our office for 2-week monitor  All questions answered Long discussion concerning smoking cessation techniques Role of each medication,  recovery instructions provided Greater than 50% was spent in counseling and coordination of care with patient Total encounter time 35 minutes or more   Signed: Esmond Plants  M.D., Ph.D. [facility]  [facility]

## 2017-12-15 NOTE — Interval H&P Note (Signed)
History and Physical Interval Note:  12/15/2017 11:49 AM  Charles Hickman  has presented today for surgery, with the diagnosis of LT Heart Cath  Abnormal Stress Test  The various methods of treatment have been discussed with the patient and family. After consideration of risks, benefits and other options for treatment, the patient has consented to  Procedure(s): LEFT HEART CATH AND CORONARY ANGIOGRAPHY (N/A) as a surgical intervention .  The patient's history has been reviewed, patient examined, no change in status, stable for surgery.  I have reviewed the patient's chart and labs.  Questions were answered to the patient's satisfaction.     Kathlyn [place]

## 2017-12-16 ENCOUNTER — Telehealth: Payer: Self-pay | Admitting: Cardiovascular Disease

## 2017-12-16 ENCOUNTER — Encounter: Payer: Self-pay | Admitting: Cardiovascular Disease

## 2017-12-16 ENCOUNTER — Other Ambulatory Visit: Payer: Self-pay

## 2017-12-16 DIAGNOSIS — Z720 Tobacco use: Secondary | ICD-10-CM | POA: Diagnosis not present

## 2017-12-16 DIAGNOSIS — E785 Hyperlipidemia, unspecified: Secondary | ICD-10-CM

## 2017-12-16 DIAGNOSIS — I25118 Atherosclerotic heart disease of native coronary artery with other forms of angina pectoris: Secondary | ICD-10-CM | POA: Diagnosis not present

## 2017-12-16 DIAGNOSIS — I200 Unstable angina: Secondary | ICD-10-CM | POA: Diagnosis not present

## 2017-12-16 DIAGNOSIS — E1142 Type 2 diabetes mellitus with diabetic polyneuropathy: Secondary | ICD-10-CM | POA: Diagnosis not present

## 2017-12-16 DIAGNOSIS — I2510 Atherosclerotic heart disease of native coronary artery without angina pectoris: Secondary | ICD-10-CM

## 2017-12-16 DIAGNOSIS — I208 Other forms of angina pectoris: Secondary | ICD-10-CM | POA: Diagnosis not present

## 2017-12-16 DIAGNOSIS — I10 Essential (primary) hypertension: Secondary | ICD-10-CM | POA: Diagnosis not present

## 2017-12-16 DIAGNOSIS — R9439 Abnormal result of other cardiovascular function study: Secondary | ICD-10-CM

## 2017-12-16 DIAGNOSIS — C8330 Diffuse large B-cell lymphoma, unspecified site: Secondary | ICD-10-CM

## 2017-12-16 DIAGNOSIS — F17219 Nicotine dependence, cigarettes, with unspecified nicotine-induced disorders: Secondary | ICD-10-CM

## 2017-12-16 DIAGNOSIS — I519 Heart disease, unspecified: Secondary | ICD-10-CM

## 2017-12-16 LAB — BASIC METABOLIC PANEL
ANION GAP: 1 — AB (ref 5–15)
BUN: 10 mg/dL (ref 6–20)
CALCIUM: 8.2 mg/dL — AB (ref 8.9–10.3)
CHLORIDE: 107 mmol/L (ref 101–111)
CO2: 29 mmol/L (ref 22–32)
Creatinine, Ser: 0.79 mg/dL (ref 0.61–1.24)
GFR calc non Af Amer: >60 mL/min (ref >60)
GLUCOSE: 159 mg/dL — AB (ref 65–99)
POTASSIUM: 4.1 mmol/L (ref 3.5–5.1)
Sodium: 137 mmol/L (ref 135–145)

## 2017-12-16 LAB — CBC
HEMATOCRIT: 45.1 % (ref 40.0–52.0)
HEMOGLOBIN: 15.3 g/dL (ref 13.0–18.0)
MCH: 30.2 pg (ref 26.0–34.0)
MCHC: 33.9 g/dL (ref 32.0–36.0)
MCV: 89 fL (ref 80.0–100.0)
Platelets: 149 10*3/uL — ABNORMAL LOW (ref 150–440)
RBC: 5.06 MIL/uL (ref 4.40–5.90)
RDW: 13.1 % (ref 11.5–14.5)
WBC: 6.2 10*3/uL (ref 3.8–10.6)

## 2017-12-16 MED ORDER — NICOTINE 14 MG/24HR TD PT24: 14.0000 mg | MEDICATED_PATCH | Freq: Every day | TRANSDERMAL | 11 refills | Status: DC

## 2017-12-16 MED ORDER — NITROGLYCERIN 0.4 MG SL SUBL: 0.4000 mg | SUBLINGUAL_TABLET | SUBLINGUAL | Status: DC | PRN

## 2017-12-16 MED ORDER — METOPROLOL TARTRATE 25 MG PO TABS: 12.5000 mg | ORAL_TABLET | Freq: Two times a day (BID) | ORAL | Status: DC

## 2017-12-16 MED ORDER — CLOPIDOGREL BISULFATE 75 MG PO TABS: 75.0000 mg | ORAL_TABLET | Freq: Every day | ORAL | 5 refills | Status: DC

## 2017-12-16 MED ORDER — NITROGLYCERIN 0.4 MG SL SUBL: 0.4000 mg | SUBLINGUAL_TABLET | SUBLINGUAL | 11 refills | Status: DC | PRN

## 2017-12-16 MED ORDER — METOPROLOL TARTRATE 25 MG PO TABS: 12.5000 mg | ORAL_TABLET | Freq: Two times a day (BID) | ORAL | 5 refills | Status: DC

## 2017-12-16 NOTE — Telephone Encounter (Signed)
TCM....  Patient is being discharged   They saw Christell Faith and Dr Fletcher Anon in hospital   They are scheduled to see Dr Rockey Situ on 12/30/17  They were seen for   They need to be seen within 2 weeks  Pt is not  wait list   Please call

## 2017-12-16 NOTE — Discharge Instructions (Signed)
Coronary Angiogram With Stent °Coronary angiogram with stent placement is a procedure to widen or open a narrow blood vessel of the heart (coronary artery). Arteries may become blocked by cholesterol buildup (plaques) in the lining of the wall. When a coronary artery becomes partially blocked, blood flow to that area decreases. This may lead to chest pain or a heart attack (myocardial infarction). °A stent is a small piece of metal that looks like mesh or a spring. Stent placement may be done as treatment for a heart attack or right after a coronary angiogram in which a blocked artery is found. °Let your health care provider know about: °· Any allergies you have. °· All medicines you are taking, including vitamins, herbs, eye drops, creams, and over-the-counter medicines. °· Any problems you or family members have had with anesthetic medicines. °· Any blood disorders you have. °· Any surgeries you have had. °· Any medical conditions you have. °· Whether you are pregnant or may be pregnant. °What are the risks? °Generally, this is a safe procedure. However, problems may occur, including: °· Damage to the heart or its blood vessels. °· A return of blockage. °· Bleeding, infection, or bruising at the insertion site. °· A collection of blood under the skin (hematoma) at the insertion site. °· A blood clot in another part of the body. °· Kidney injury. °· Allergic reaction to the dye or contrast that is used. °· Bleeding into the abdomen (retroperitoneal bleeding). ° °What happens before the procedure? °Staying hydrated °Follow instructions from your health care provider about hydration, which may include: °· Up to 2 hours before the procedure - you may continue to drink clear liquids, such as water, clear fruit juice, black coffee, and plain tea. ° °Eating and drinking restrictions °Follow instructions from your health care provider about eating and drinking, which may include: °· 8 hours before the procedure - stop  eating heavy meals or foods such as meat, fried foods, or fatty foods. °· 6 hours before the procedure - stop eating light meals or foods, such as toast or cereal. °· 2 hours before the procedure - stop drinking clear liquids. ° °Ask your health care provider about: °· Changing or stopping your regular medicines. This is especially important if you are taking diabetes medicines or blood thinners. °· Taking medicines such as ibuprofen. These medicines can thin your blood. Do not take these medicines before your procedure if your health care provider instructs you not to. Generally, aspirin is recommended before a procedure of passing a small, thin tube (catheter) through a blood vessel and into the heart (cardiac catheterization). ° °What happens during the procedure? °· An IV tube will be inserted into one of your veins. °· You will be given one or more of the following: °? A medicine to help you relax (sedative). °? A medicine to numb the area where the catheter will be inserted into an artery (local anesthetic). °· To reduce your risk of infection: °? Your health care team will wash or sanitize their hands. °? Your skin will be washed with soap. °? Hair may be removed from the area where the catheter will be inserted. °· Using a guide wire, the catheter will be inserted into an artery. The location may be in your groin, in your wrist, or in the fold of your arm (near your elbow). °· A type of X-ray (fluoroscopy) will be used to help guide the catheter to the opening of the arteries in the heart. °·   A dye will be injected into the catheter, and X-rays will be taken. The dye will help to show where any narrowing or blockages are located in the arteries.  A tiny wire will be guided to the blocked spot, and a balloon will be inflated to make the artery wider.  The stent will be expanded and will crush the plaques into the wall of the vessel. The stent will hold the area open and improve the blood flow. Most stents  have a drug coating to reduce the risk of the stent narrowing over time.  The artery may be made wider using a drill, laser, or other tools to remove plaques.  When the blood flow is better, the catheter will be removed. The lining of the artery will grow over the stent, which stays where it was placed. This procedure may vary among health care providers and hospitals. What happens after the procedure?  If the procedure is done through the leg, you will be kept in bed lying flat for about 6 hours. You will be instructed to not bend and not cross your legs.  The insertion site will be checked frequently.  The pulse in your foot or wrist will be checked frequently.  You may have additional blood tests, X-rays, and a test that records the electrical activity of your heart (electrocardiogram, or ECG). This information is not intended to replace advice given to you by your health care provider. Make sure you discuss any questions you have with your health care provider. Document Released: 06/12/2003 Document Revised: 08/05/2016 Document Reviewed: 07/11/2016 Elsevier Interactive Patient Education  2018 [employer] Refer to this sheet in the next few weeks. These instructions provide you with information about caring for yourself after your procedure. Your health care provider may also give you more specific instructions. Your treatment has been planned according to current medical practices, but problems sometimes occur. Call your health care provider if you have any problems or questions after your procedure. What can I expect after the procedure? After your procedure, it is typical to have the following:  Bruising at the radial site that usually fades within 1-2 weeks.  Blood collecting in the tissue (hematoma) that may be painful to the touch. It should usually decrease in size and tenderness within 1-2 weeks.  Follow these instructions at home:  Take medicines  only as directed by your health care provider.  You may shower 24-48 hours after the procedure or as directed by your health care provider. Remove the bandage (dressing) and gently wash the site with plain soap and water. Pat the area dry with a clean towel. Do not rub the site, because this may cause bleeding.  Do not take baths, swim, or use a hot tub until your health care provider approves.  Check your insertion site every day for redness, swelling, or drainage.  Do not apply powder or lotion to the site.  Do not flex or bend the affected arm for 24 hours or as directed by your health care provider.  Do not push or pull heavy objects with the affected arm for 24 hours or as directed by your health care provider.  Do not lift over 10 lb (4.5 kg) for 5 days after your procedure or as directed by your health care provider.  Ask your health care provider when it is okay to: ? Return to work or school. ? Resume usual physical activities or sports. ? Resume sexual activity.  Do not drive home if you are discharged the same day as the procedure. Have someone else drive you.  You may drive 24 hours after the procedure unless otherwise instructed by your health care provider.  Do not operate machinery or power tools for 24 hours after the procedure.  If your procedure was done as an outpatient procedure, which means that you went home the same day as your procedure, a responsible adult should be with you for the first 24 hours after you arrive home.  Keep all follow-up visits as directed by your health care provider. This is important. Contact a health care provider if:  You have a fever.  You have chills.  You have increased bleeding from the radial site. Hold pressure on the site. Get help right away if:  You have unusual pain at the radial site.  You have redness, warmth, or swelling at the radial site.  You have drainage (other than a small amount of blood on the dressing)  from the radial site.  The radial site is bleeding, and the bleeding does not stop after 30 minutes of holding steady pressure on the site.  Your arm or hand becomes pale, cool, tingly, or numb. This information is not intended to replace advice given to you by your health care provider. Make sure you discuss any questions you have with your health care provider. Document Released: 01/08/2011 Document Revised: 05/13/2016 Document Reviewed: 06/24/2014 Elsevier Interactive Patient Education  2018 [employer].

## 2017-12-16 NOTE — Progress Notes (Signed)
56 year old referred to Cardiac Rehab with dx of s/p coronary stents and stable angina.  Hx:  Patient with poorly controlled Diabetes, hx of large cell lymphoma - s/p chemotherapy, peripheral neuropathy, HLD, CVA, and CAD.  Underwent cardiac stress test prior to cath which was found to be abnormal.  Cath performed on 12/15/2017 and was found to have 2-vessel disease with 75% occlusion of LAD and chronically occluded distal RCA with left to right collaterals, mildly reduced EF of 45-50% with inferior wall hypokinesis.   Patient underwent successful PCI with DES to Prox LAD.  The RCA was left to be treated medically.   Rounded on patient: ?  Booklet, "Angioplasty and Stents," given and reviewed with patient.   ? Reviewed the meaning of CAD and what that means. Risk factors of heart disease reviewed with patient. Explained the importance of controlling BP, cholesterol, and blood sugar; maintaining a healthy weight; smoking cessation, and exercise.?Informed patient the nurse will provide him with the stent card and instructed him to keep with him at all times. ?  Smoking cessation?- patient states he has just quit smoking.  Currently wearing 14 mg Nicotine patch and chewing on straw.  Thinking about Quitting Tobacco - Yes You Can informational sheet given to patient.     ?? ?Discussed the importance of following a low sodium, low fat, low cholesterol heart healthy diet. Patient eats a lot of fast food while on the job.  Patient refused offer of dietitian consultation.  Patient stated, "I know what I need to do"   Reviewed cardiac medications, rationale for taking, and side effects.?  Exercise discussed. Patient informed this RN that he is very active in his job.  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  Informed patient he had been referred to [place] by his cardiologist.  Patient quickly spoke up and informed this RN that  it would be impossible for him to participate in any organized program.  Patient refusing Cardiac Rehab.  Patient did inform this RN that he has a treadmill at home and he could walk on it.  I encouraged him to gradually build up to 150 minutes per week of walking on the treadmill.   ??? Patient thanked this RN for stopping by.  Roanna Epley, RN, BSN, [facility] Cardiovascular and Pulmonary Nurse Navigator

## 2017-12-16 NOTE — Discharge Planning (Signed)
Discharge instructions reviewed with pt. Pt verbalizes understanding. Pt ready for discharge home. 

## 2017-12-16 NOTE — Telephone Encounter (Signed)
Patient contacted regarding discharge from [facility] on 12/16/17.  Patient understands to follow up with provider Dr. Rockey Situ on 12/30/17 at 08:40AM at [facility] Patient understands discharge instructions? Yes Patient understands medications and regiment? Yes Patient understands to bring all medications to this visit? Yes  Patient verbalized understanding and confirmed upcoming appointment with no further questions at this time.

## 2017-12-16 NOTE — Discharge Summary (Signed)
Discharge Summary    Patient ID: Charles Hickman  MRN: [id number], DOB/AGE: Aug 12, 1961 56 y.o.  Admit Date: 12/15/2017 Discharge Date: 12/16/2017  Primary Care Provider: Leone Haven, MD Primary Cardiologist: Dr. Rockey Situ, MD  Discharge Diagnoses    Principal Problem:   Effort angina [facility]) Active Problems:   Abnormal stress test   CAD in native artery   Essential hypertension   Diabetes ([place])   Systolic dysfunction   Diffuse large B-cell lymphoma (HCC)   Tobacco abuse   HLD (hyperlipidemia)   Allergies No Known Allergies   History of Present Illness     56 year old male with history of poorly controlled DM2, large cell lymphoma s/p chemotherapy with Cytoxan and R-CHOP residual peripheral neuropathy, HCV, HTN, HLD, CVA, and cartoid artery disease who presented to [facility] on 12/27 for outpatient diagnostic LHC as detailed below.   He was seen in consultation by Dr. Rockey Situ on 11/27 for abnormal EKG that showed NSR, 90 bpm, occasional PVCs, old inferior MI with Q waves and TWI in leads II, III, aVF and Q waves in V3-V6. Prior report of chest pain back in 04/2016 with associated near-syncope and diaphoresis. He did not seek medical attention at that time. Symptoms resolved on their own. Prior echo in 07/2015 EF > 55%, aortic sclerosis, normal RV systolic function. He underwent nuclear stress testing on 11/25/2017 that showed a moderate sized region of mild ischemia in the mid to apical inferior wall consistent with peri-infarct ischemia, large region of hypokinesis of the inferior wall, EF 34%, EKG showing NSR with old inferior MI. No EKG changes concerning for ischemia at peak stress or in recovery. Moderate to high risk scan.   Hospital Course     Consultants: cardiac rehab   Because of the abnormal stress test, he underwent diagnostic LHC on 12/15/17 that showed significant two-vessel CAD with 75% proximal LAD stenosis which was noted to be significant with FFR at 0.74, 90% mid RCA  stenosis, chronically occluded distal RCA with left-to-right collaterals, mildly reduced LVSF with an EF of 45-50% with inferior wall hypokinesis. He underwent successful PCI/DES with a Xience Sierra drug-eluting stent (3.5 x 33 mm). Dual antiplatelet therapy was advised for at least 6 months with aggressive risk factor modification. The RCA was left to be treated medically. He has ambulated without issues. Post cath labs remained stable. He will start nicotine patches at discharge. He declines cardiac rehab.   The patient's right radial cath site has been examined is healing well without issues at this time. The patient has been seen by Dr. Rockey Situ, MD and felt to be stable for discharge today. All follow up appointments have been made. Discharge medications are listed below. Prescriptions have been reviewed with the patient and sent in to their pharmacy.  _____________  Discharge Vitals Blood pressure 131/83, pulse 67, temperature 97.8 F (36.6 C), temperature source Oral, resp. rate 17, height _0  (1.778 m), weight 227 lb (103 kg), SpO2 96 %.  Filed Weights   12/15/17 0652  Weight: 227 lb (103 kg)    Labs & Radiologic Studies    CBC Recent Labs    12/16/17 0500  WBC 6.2  HGB 15.3  HCT 45.1  MCV 89.0  PLT 160*   Basic Metabolic Panel Recent Labs    12/16/17 0500  NA 137  K 4.1  CL 107  CO2 29  GLUCOSE 159*  BUN 10  CREATININE 0.79  CALCIUM 8.2*   Liver Function Tests  No results for input(s): AST, ALT, ALKPHOS, BILITOT, PROT, ALBUMIN in the last 72 hours. No results for input(s): LIPASE, AMYLASE in the last 72 hours. Cardiac Enzymes No results for input(s): CKTOTAL, CKMB, CKMBINDEX, TROPONINI in the last 72 hours. BNP Invalid input(s): POCBNP D-Dimer No results for input(s): DDIMER in the last 72 hours. Hemoglobin A1C No results for input(s): HGBA1C in the last 72 hours. Fasting Lipid Panel No results for input(s): CHOL, HDL, LDLCALC, TRIG, CHOLHDL, LDLDIRECT in  the last 72 hours. Thyroid Function Tests No results for input(s): TSH, T4TOTAL, T3FREE, THYROIDAB in the last 72 hours.  Invalid input(s): FREET3 _____________  Nm Myocar Multi W/spect W/wall Motion / Ef  Result Date: 11/25/2017 Exercise myocardial perfusion imaging study with large perfusion defect in the inferior wall consistent with pervious MI, Moderate sized region of mild ischemia in the mid to apical inferior wall consistent with peri-infarct ischemia Large region of hypokinesis of the inferior wall,  EF estimated at 34% No EKG changes concerning for ischemia at peak stress or in recovery. Resting EKG showing NSR with old inferior MI Moderate to high risk scan Signed, Esmond Plants, MD, Ph.D [facility]    Diagnostic Studies/Procedures   [place] 12/15/2017: Coronary Findings   Diagnostic  Dominance: Right  Left Main  Vessel is angiographically normal.  Left Anterior Descending  Prox LAD lesion 75% stenosed  Prox LAD lesion is 75% stenosed. The lesion is type C. The lesion was not previously treated. Pressure wire/FFR was performed on the lesion. FFR: 0.74.  First Diagonal Branch  There is mild disease in the vessel.  Second Diagonal Branch  There is mild disease in the vessel.  Third Diagonal Branch  Vessel is small in size. The vessel exhibits minimal luminal irregularities.  Left Circumflex  There is mild diffuse disease throughout the vessel.  First Obtuse Marginal Branch  The vessel exhibits minimal luminal irregularities.  Second Obtuse Marginal Branch  The vessel exhibits minimal luminal irregularities.  Third Obtuse Marginal Branch  The vessel exhibits minimal luminal irregularities.  Right Coronary Artery  Mid RCA lesion 90% stenosed  Mid RCA lesion is 90% stenosed.  Dist RCA lesion 100% stenosed  Dist RCA lesion is 100% stenosed.  Right Posterior Atrioventricular Branch  Collaterals  Post Atrio filled by collaterals from Dist Cx.    Collaterals  Post Atrio  filled by collaterals from 3rd Sept.    Intervention   Prox LAD lesion  Stent  Lesion length: 30 mm. Lesion crossed with guidewire using a [place]. Pre-stent angioplasty was not performed. A drug-eluting stent was successfully placed using a STENT SIERRA 3.50 X 33 MM. Maximum pressure: 20 atm. Inflation time: 12 sec. Post-stent angioplasty was performed using a BALLOON Gardner TREK RX 3.5X20. Maximum pressure: 16 atm. Inflation time: 15 sec.  Post-Intervention Lesion Assessment  The intervention was successful. Pre-interventional TIMI flow is 3. Post-intervention TIMI flow is 3. No complications occurred at this lesion.  There is no residual stenosis post intervention.  Wall Motion              Left Heart   Left Ventricle The left ventricle is mildly dilated. There is mild left ventricular systolic dysfunction. LV end diastolic pressure is normal. The left ventricular ejection fraction is 45-50% by visual estimate. There are LV function abnormalities due to segmental dysfunction.  Coronary Diagrams   Diagnostic Diagram       Post-Intervention Diagram        Conclusion  Mid RCA lesion is 90% stenosed.  Dist RCA lesion is 100% stenosed.  Prox LAD lesion is 75% stenosed.  There is mild left ventricular systolic dysfunction.  LV end diastolic pressure is normal.  The left ventricular ejection fraction is 45-50% by visual estimate.  A drug-eluting stent was successfully placed using a STENT SIERRA 3.50 X 33 MM.  Post intervention, there is a 0% residual stenosis.   1.  Significant two-vessel coronary artery disease with chronically occluded distal right coronary artery with left-to-right collaterals.  75% proximal LAD disease which was found to be significant by fractional flow reserve at 0.74. 2.  Mildly reduced LV systolic function with an EF of 45-50% with inferior wall hypokinesis 3.  Successful drug-eluting stent placement to the proximal  LAD.  Recommendations: Dual antiplatelet therapy for at least 6 months.  Aggressive treatment of risk factors.  The RCA can be treated medically.    _____________  Disposition   Pt is being discharged home today in good condition.  Follow-up Plans & Appointments    Follow-up Information    Minna Merritts, MD Follow up on 12/30/2017.   Specialty:  Cardiology Why:  Appointment time 8:40 AM Contact information: [place] [address] [phone]          Discharge Instructions    AMB Referral to Cardiac Rehabilitation - Phase II   Complete by:  As directed    Diagnosis:   Coronary Stents Stable Angina     Call MD for:  difficulty breathing, headache or visual disturbances   Complete by:  As directed    Call MD for:  extreme fatigue   Complete by:  As directed    Call MD for:  hives   Complete by:  As directed    Call MD for:  persistant dizziness or light-headedness   Complete by:  As directed    Call MD for:  persistant nausea and vomiting   Complete by:  As directed    Call MD for:  redness, tenderness, or signs of infection (pain, swelling, redness, odor or green/yellow discharge around incision site)   Complete by:  As directed    Call MD for:  severe uncontrolled pain   Complete by:  As directed    Call MD for:  temperature >100.4   Complete by:  As directed    Diet - low sodium heart healthy   Complete by:  As directed    Increase activity slowly   Complete by:  As directed       Discharge Medications     Medication List    TAKE these medications   aspirin EC 81 MG tablet Take 81 mg by mouth daily.   clopidogrel 75 MG tablet Commonly known as:  PLAVIX Take 1 tablet (75 mg total) by mouth daily with breakfast. Start taking on:  12/17/2017   empagliflozin 10 MG Tabs tablet Commonly known as:  JARDIANCE Take 10 mg by mouth daily.   lisinopril 10 MG tablet Commonly known as:  PRINIVIL,ZESTRIL Take 0.5 tablets  (5 mg total) by mouth 2 (two) times daily.   metoprolol tartrate 25 MG tablet Commonly known as:  LOPRESSOR Take 0.5 tablets (12.5 mg total) by mouth 2 (two) times daily.   nicotine 14 mg/24hr patch Commonly known as:  NICODERM CQ - dosed in mg/24 hours Place 1 patch (14 mg total) onto the skin daily. Start taking on:  12/17/2017   nitroGLYCERIN 0.4 MG SL  tablet Commonly known as:  NITROSTAT Place 1 tablet (0.4 mg total) under the tongue every 5 (five) minutes as needed for chest pain.   rosuvastatin 20 MG tablet Commonly known as:  CRESTOR Take 1 tablet (20 mg total) by mouth daily.       Allergies as of 12/16/2017   No Known Allergies       Aspirin prescribed at discharge?  Yes High Intensity Statin Prescribed? (Lipitor 40-36m or Crestor 20-427m: Yes Beta Blocker Prescribed? Yes For EF <40%, was ACEI/ARB Prescribed? No: EF > 40% ADP Receptor Inhibitor Prescribed? (i.e. Plavix etc.-Includes Medically Managed Patients): Yes For EF <40%, Aldosterone Inhibitor Prescribed? No: EF > 40% Was EF assessed during THIS hospitalization? Yes Was Cardiac Rehab II ordered? (Included Medically managed Patients): Yes   Outstanding Labs/Studies   None.   Duration of Discharge Encounter   Greater than 30 minutes including physician time.  Signed, RyRise MuPA-C [facility] Pager: [phone]/28/2018, 9:21 AM

## 2017-12-29 DIAGNOSIS — I2510 Atherosclerotic heart disease of native coronary artery without angina pectoris: Secondary | ICD-10-CM | POA: Insufficient documentation

## 2017-12-29 NOTE — Progress Notes (Signed)
Cardiology Office Note  Date:  12/30/2017   ID:  Charles Hickman, DOB [birth year], MRN [id number]  PCP:  Leone Haven, MD   Chief Complaint  Patient presents with  . Other    Hospital follow up. Patient denies chest pain and SOB at this time. Meds reviewed verbally with patient.     HPI:  Charles Hickman is a 57 y.o. male  Smoker DM II  HBA1C 8.6  Diffuse large cell lymphoma,  6 rounds R-CHOP 2 years ago  Hypertension CVA, left leg , resolved quickly, BP was elevted in the ER 05/2011 lacunar infarction, carotid atheroscerosis Who presents for follow up of his CAD, stent to LAD  Stress test ordered for chest pain symptoms Showing large perfusion defect inferior wall consistent with previous MI Ischemia mid to apical inferior wall ejection fraction 34% 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  cardiac catheterization December 15, 2017 showing chronically occluded distal RCA with left-to-right collaterals Also had 75% proximal LAD disease FFR 0.74 ejection fraction 45% Had successful drug-eluting stent placed in the proximal LAD  Lab work reviewed with him in detail Total chol LDL 63,   He is having rare stomach "pains", worried about lymphoma Reports that oncology had previously wanted him to have endoscopy but wanted cardiac issues managed first  He is not smoking, Using nicotine patches  EKG personally reviewed by myself on todays visit Shows normal sinus rhythm rate 66 bpm old inferior MI  He reports having episode of chest pain back in May 2017 Near syncope, diaphoresis Did not go to the hospital "chilled the rest of day" Symptoms resolved on their own without intervention  Past medical history reviewed Review of previous history shows he had colonoscopy 2016 leading to diagnosis of lymphoma, followed by chemotherapy He had a PET CT x2 showing moderate aortic atherosclerosis No mention of coronary calcifications noted  Echo during chemo  07/2015 Normal left ventricular  systolic function, ejection fraction > 55%  Aortic sclerosis  Normal right ventricular systolic function  Neuropathy in legs, made worse by chemotherapy for the patient   PMH:   has a past medical history of CAD in native artery, Diabetes mellitus ([place]), HCV (hepatitis C virus), HLD (hyperlipidemia), Hypertension, Large cell lymphoma (Charles Hickman), Peripheral neuropathy, Stroke (Charles Hickman), and Systolic dysfunction.  PSH:    Past Surgical History:  Procedure Laterality Date  . APPENDECTOMY    . INTRAVASCULAR PRESSURE WIRE/FFR STUDY N/A 12/15/2017   Procedure: INTRAVASCULAR PRESSURE WIRE/FFR STUDY;  Surgeon: Wellington Hampshire, MD;  Location: [place] [facility];  Service: Cardiovascular;  Laterality: N/A;  . LEFT HEART CATH AND CORONARY ANGIOGRAPHY N/A 12/15/2017   Procedure: LEFT HEART CATH AND CORONARY ANGIOGRAPHY;  Surgeon: Wellington Hampshire, MD;  Location: [place] [facility];  Service: Cardiovascular;  Laterality: N/A;  . TONSILLECTOMY AND ADENOIDECTOMY      Current Outpatient Medications  Medication Sig Dispense Refill  . aspirin EC 81 MG tablet Take 81 mg by mouth daily.    . clopidogrel (PLAVIX) 75 MG tablet Take 1 tablet (75 mg total) by mouth daily with breakfast. 30 tablet 5  . empagliflozin (JARDIANCE) 10 MG TABS tablet Take 10 mg by mouth daily. 30 tablet 3  . lisinopril (PRINIVIL,ZESTRIL) 10 MG tablet Take 0.5 tablets (5 mg total) by mouth 2 (two) times daily. 90 tablet 1  . metoprolol tartrate (LOPRESSOR) 25 MG tablet Take 0.5 tablets (12.5 mg total) by mouth 2 (two) times daily. 60 tablet 5  .  nicotine (NICODERM CQ - DOSED IN MG/24 HR) 7 mg/24hr patch Place 1 patch (7 mg total) onto the skin daily. 30 patch 6  . nitroGLYCERIN (NITROSTAT) 0.4 MG SL tablet Place 1 tablet (0.4 mg total) under the tongue every 5 (five) minutes as needed for chest pain. 25 tablet 11  . rosuvastatin (CRESTOR) 20 MG tablet Take 1 tablet (20 mg total) by mouth daily. 90 tablet 3   No current  facility-administered medications for this visit.      Allergies:   Patient has no known allergies.   Social History:  The patient  reports that he has been smoking.  He has been smoking about 0.25 packs per day. he has never used smokeless tobacco. He reports that he drinks alcohol. He reports that he does not use drugs.   Family History:   family history includes Dementia in his maternal grandmother; Diabetes in his father and mother; Hyperlipidemia in his mother; Hypertension in his father and mother; Kidney disease in his father; Stroke in his mother.    Review of Systems: Review of Systems  Constitutional: Negative.   Respiratory: Positive for shortness of breath.   Cardiovascular: Negative.        Remote episode of chest pain  Gastrointestinal: Positive for abdominal pain.  Musculoskeletal: Negative.   Neurological: Negative.   Psychiatric/Behavioral: Negative.   All other systems reviewed and are negative.    PHYSICAL EXAM: VS:  BP 106/70 (BP Location: Left Arm, Patient Position: Sitting, Cuff Size: Normal)   Pulse 66   Ht 5\' 11"  (1.803 m)   Wt 237 lb (107.5 kg)   BMI 33.05 kg/m  , BMI Body mass index is 33.05 kg/m. GEN: Well nourished, well developed, in no acute distress  HEENT: normal  Neck: no JVD, carotid bruits, or masses Cardiac: RRR; no murmurs, rubs, or gallops,no edema  Respiratory:  clear to auscultation bilaterally, normal work of breathing GI: soft, nontender, nondistended, + BS MS: no deformity or atrophy  Skin: warm and dry, no rash Neuro:  Strength and sensation are intact Psych: euthymic mood, full affect    Recent Labs: 11/15/2017: Magnesium 2.3; TSH 1.401 12/07/2017: ALT 29 12/16/2017: BUN 10; Creatinine, Ser 0.79; Hemoglobin 15.3; Platelets 149; Potassium 4.1; Sodium 137    Lipid Panel Lab Results  Component Value Date   CHOL 216 (H) 10/05/2017   HDL 37 (L) 10/05/2017   LDLCALC 116 (H) 10/05/2017   TRIG 316 (H) 10/05/2017   Most  recent LDL 63, December 2018  Wt Readings from Last 3 Encounters:  12/30/17 237 lb (107.5 kg)  12/15/17 227 lb (103 kg)  11/15/17 227 lb (103 kg)       ASSESSMENT AND PLAN:  Chest pain, unspecified type -  Stent to LAD, occluded distal RCA Discussed recent cardiac catheterization findings with him Stay on aspirin Plavix He is quit smoking  Type 2 diabetes mellitus with diabetic polyneuropathy, without long-term current use of insulin ([place]) - Plan: EKG 12-Lead Hemoglobin A1c elevated greater than 8,  We have encouraged continued exercise, careful diet management in an effort to lose weight.  Diffuse large B-cell lymphoma, unspecified body region [facility]) - Plan: EKG 12-Lead Received 6 rounds of chemotherapy Starting 2016,  May need endoscopy for abdominal pain symptoms.  This may be complicated by  Plavix Typically would like at least 6 months prior to procedure if possible  Essential hypertension -   l controlled on today's visit. No changes made to the medications. Stable  Hyperlipidemia On Crestor 20  Numbers at goal  Disposition:   F/U 12 months   Total encounter time more than 45 minutes  Greater than 50% was spent in counseling and coordination of care with the patient   Orders Placed This Encounter  Procedures  . EKG 12-Lead     Signed, Esmond Plants, M.D., Ph.D. 12/30/2017  [place], [place]

## 2017-12-30 ENCOUNTER — Ambulatory Visit: Payer: Managed Care, Other (non HMO) | Admitting: Cardiovascular Disease

## 2017-12-30 ENCOUNTER — Encounter: Payer: Self-pay | Admitting: Cardiovascular Disease

## 2017-12-30 VITALS — BP 106/70 | HR 66 | Ht 71.0 in | Wt 237.0 lb

## 2017-12-30 DIAGNOSIS — I10 Essential (primary) hypertension: Secondary | ICD-10-CM | POA: Diagnosis not present

## 2017-12-30 DIAGNOSIS — I208 Other forms of angina pectoris: Secondary | ICD-10-CM | POA: Diagnosis not present

## 2017-12-30 DIAGNOSIS — E1159 Type 2 diabetes mellitus with other circulatory complications: Secondary | ICD-10-CM

## 2017-12-30 DIAGNOSIS — I519 Heart disease, unspecified: Secondary | ICD-10-CM

## 2017-12-30 DIAGNOSIS — I2089 Other forms of angina pectoris: Secondary | ICD-10-CM

## 2017-12-30 DIAGNOSIS — I25118 Atherosclerotic heart disease of native coronary artery with other forms of angina pectoris: Secondary | ICD-10-CM

## 2017-12-30 DIAGNOSIS — Z720 Tobacco use: Secondary | ICD-10-CM

## 2017-12-30 DIAGNOSIS — E782 Mixed hyperlipidemia: Secondary | ICD-10-CM | POA: Diagnosis not present

## 2017-12-30 MED ORDER — NICOTINE 7 MG/24HR TD PT24: 7.0000 mg | MEDICATED_PATCH | Freq: Every day | TRANSDERMAL | 6 refills | Status: DC

## 2017-12-30 NOTE — Patient Instructions (Addendum)

## 2018-01-06 ENCOUNTER — Ambulatory Visit: Payer: Managed Care, Other (non HMO) | Admitting: Family Medicine

## 2018-01-06 ENCOUNTER — Encounter: Payer: Self-pay | Admitting: Family Medicine

## 2018-01-06 DIAGNOSIS — I25118 Atherosclerotic heart disease of native coronary artery with other forms of angina pectoris: Secondary | ICD-10-CM

## 2018-01-06 DIAGNOSIS — M545 Low back pain, unspecified: Secondary | ICD-10-CM | POA: Insufficient documentation

## 2018-01-06 DIAGNOSIS — E1159 Type 2 diabetes mellitus with other circulatory complications: Secondary | ICD-10-CM | POA: Diagnosis not present

## 2018-01-06 DIAGNOSIS — G8929 Other chronic pain: Secondary | ICD-10-CM

## 2018-01-06 DIAGNOSIS — C8330 Diffuse large B-cell lymphoma, unspecified site: Secondary | ICD-10-CM

## 2018-01-06 NOTE — Assessment & Plan Note (Signed)
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  Continue Jardiance.  Discussed precautions with Jardiance.

## 2018-01-06 NOTE — Assessment & Plan Note (Addendum)
He has been followed by oncology.  They have discussed doing an EGD.  The discomfort he has been having may be GI related versus musculoskeletal versus related to oncologic process.  It appears he will need to be on the Plavix for at least 6 months prior to coming off of this for an EGD.  He is going to contact oncology to see what the next step will be.

## 2018-01-06 NOTE — Progress Notes (Signed)
Tommi Rumps, MD Phone: [phone]  Charles Hickman is a 57 y.o. male who presents today for follow-up.  Since the patient's last visit he underwent a stress test and cardiac catheterization with stent placement.  He is currently on aspirin and Plavix.  He is also taking metoprolol, Crestor, and lisinopril.  Blood pressures typically running 125/80 at home.  He has followed up with cardiology already.  He notes just a couple of occasions of a slight discomfort that felt like indigestion.  He has not had to use nitroglycerin.  He reports he discussed this with his cardiologist.  There is no radiation, shortness of breath, or diaphoresis with this.  These were nonexertional.  Diabetes: Taking Jardiance.  Initially did have an increased volume of urination though this has evened out now.  Some occasional polydipsia.  He notes no genital symptoms or UTI symptoms.  Patient does have a history of diffuse large B-cell lymphoma.  He is following with oncology for this.  Prior to his cardiac evaluation they were discussing doing an EGD.  He notes very occasional slight discomfort in his left epigastric region.  Not the same pain he had when he was diagnosed with a lymphoma.  Rarely occurs.  No sweats or weight loss.  He is going to contact them to see what the next step is.  He does note chronic left low back discomfort that has been bothering him a little more recently.  No radiation.  No numbness or weakness.  No loss of bowel or bladder function.  No saddle anesthesia.  He does not do anything for this.  Social History   Tobacco Use  Smoking Status Current Every Day Smoker  . Packs/day: 0.25  Smokeless Tobacco Never Used  Tobacco Comment   Weating a patch.      ROS see history of present illness  Objective  Physical Exam Vitals:   01/06/18 0957  BP: 96/60  Pulse: (!) 59  Temp: 97.7 F (36.5 C)  SpO2: 97%    BP Readings from Last 3 Encounters:  01/06/18 96/60  12/30/17 106/70    12/16/17 131/83   Wt Readings from Last 3 Encounters:  01/06/18 234 lb 3.2 oz (106.2 kg)  12/30/17 237 lb (107.5 kg)  12/15/17 227 lb (103 kg)    Physical Exam  Constitutional: No distress.  Cardiovascular: Normal rate, regular rhythm and normal heart sounds.  Pulmonary/Chest: Effort normal and breath sounds normal.  Abdominal: Soft. Bowel sounds are normal. He exhibits no distension. There is no tenderness. There is no rebound and no guarding.    Musculoskeletal: He exhibits no edema.  No midline spine tenderness, no midline spine step-off, mild left lumbar muscular back tenderness, 5/5 strength bilateral quads, hamstrings, plantar flexion, and dorsiflexion, sensation to light touch intact in bilateral lower extremities  Neurological: He is alert. Gait normal.  Skin: Skin is warm and dry. He is not diaphoretic.     Assessment/Plan: Please see individual problem list.  Hypocalcemia Noted on recent lab work.  Needs repeat.  CAD (coronary artery disease), native coronary artery Status post drug-eluting stent placement to proximal LAD.  He has done fairly well since this is done.  He will continue his current regimen.  He will continue to follow with cardiology.  I discussed that if he were to develop chest pain or other symptoms he needs to be evaluated.  Slight indigestion type discomfort.  May be GI related.  He will monitor.  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  Continue Jardiance.  Discussed precautions with Jardiance.  Diffuse large B-cell lymphoma ([place]) He has been followed by oncology.  They have discussed doing an EGD.  The discomfort he has been having may be GI related versus musculoskeletal versus related to oncologic process.  It appears he will need to be on the Plavix for at least 6 months prior to coming off of this for an EGD.  He is going to contact oncology to see what the next step will be.  Chronic low back  pain Chronic issue.  Discussed physical therapy though he declined.  He will do exercises for this.   No orders of the defined types were placed in this encounter.   No orders of the defined types were placed in this encounter.    Tommi Rumps, MD [place]

## 2018-01-06 NOTE — Patient Instructions (Addendum)
Nice to see you. We will have you do lab work.  We will contact you with the results. If you develop chest pain that does not go away please be evaluated. Please contact your oncologist to see what they want to do regarding an EGD. If you develop an illness with nausea or vomiting you should hold the Jardiance.  If you develop any issues with your genitals with the Jardiance please discontinue it and be evaluated.   Back Exercises If you have pain in your back, do these exercises 2-3 times each day or as told by your doctor. When the pain goes away, do the exercises once each day, but repeat the steps more times for each exercise (do more repetitions). If you do not have pain in your back, do these exercises once each day or as told by your doctor. Exercises Single Knee to Chest  Do these steps 3-5 times in a row for each leg: 1. Lie on your back on a firm bed or the floor with your legs stretched out. 2. Bring one knee to your chest. 3. Hold your knee to your chest by grabbing your knee or thigh. 4. Pull on your knee until you feel a gentle stretch in your lower back. 5. Keep doing the stretch for 10-30 seconds. 6. Slowly let go of your leg and straighten it.  Pelvic Tilt  Do these steps 5-10 times in a row: 1. Lie on your back on a firm bed or the floor with your legs stretched out. 2. Bend your knees so they point up to the ceiling. Your feet should be flat on the floor. 3. Tighten your lower belly (abdomen) muscles to press your lower back against the floor. This will make your tailbone point up to the ceiling instead of pointing down to your feet or the floor. 4. Stay in this position for 5-10 seconds while you gently tighten your muscles and breathe evenly.  Cat-Cow  Do these steps until your lower back bends more easily: 1. Get on your hands and knees on a firm surface. Keep your hands under your shoulders, and keep your knees under your hips. You may put padding under your  knees. 2. Let your head hang down, and make your tailbone point down to the floor so your lower back is round like the back of a cat. 3. Stay in this position for 5 seconds. 4. Slowly lift your head and make your tailbone point up to the ceiling so your back hangs low (sags) like the back of a cow. 5. Stay in this position for 5 seconds.  Press-Ups  Do these steps 5-10 times in a row: 1. Lie on your belly (face-down) on the floor. 2. Place your hands near your head, about shoulder-width apart. 3. While you keep your back relaxed and keep your hips on the floor, slowly straighten your arms to raise the top half of your body and lift your shoulders. Do not use your back muscles. To make yourself more comfortable, you may change where you place your hands. 4. Stay in this position for 5 seconds. 5. Slowly return to lying flat on the floor.  Bridges  Do these steps 10 times in a row: 1. Lie on your back on a firm surface. 2. Bend your knees so they point up to the ceiling. Your feet should be flat on the floor. 3. Tighten your butt muscles and lift your butt off of the floor until your waist is almost  as high as your knees. If you do not feel the muscles working in your butt and the back of your thighs, slide your feet 1-2 inches farther away from your butt. 4. Stay in this position for 3-5 seconds. 5. Slowly lower your butt to the floor, and let your butt muscles relax.  If this exercise is too easy, try doing it with your arms crossed over your chest. Belly Crunches  Do these steps 5-10 times in a row: 1. Lie on your back on a firm bed or the floor with your legs stretched out. 2. Bend your knees so they point up to the ceiling. Your feet should be flat on the floor. 3. Cross your arms over your chest. 4. Tip your chin a little bit toward your chest but do not bend your neck. 5. Tighten your belly muscles and slowly raise your chest just enough to lift your shoulder blades a tiny bit  off of the floor. 6. Slowly lower your chest and your head to the floor.  Back Lifts Do these steps 5-10 times in a row: 1. Lie on your belly (face-down) with your arms at your sides, and rest your forehead on the floor. 2. Tighten the muscles in your legs and your butt. 3. Slowly lift your chest off of the floor while you keep your hips on the floor. Keep the back of your head in line with the curve in your back. Look at the floor while you do this. 4. Stay in this position for 3-5 seconds. 5. Slowly lower your chest and your face to the floor.  Contact a doctor if:  Your back pain gets a lot worse when you do an exercise.  Your back pain does not lessen 2 hours after you exercise. If you have any of these problems, stop doing the exercises. Do not do them again unless your doctor says it is okay. Get help right away if:  You have sudden, very bad back pain. If this happens, stop doing the exercises. Do not do them again unless your doctor says it is okay. This information is not intended to replace advice given to you by your health care provider. Make sure you discuss any questions you have with your health care provider. Document Released: 01/08/2011 Document Revised: 05/13/2016 Document Reviewed: 01/30/2015 Elsevier Interactive Patient Education  [employer].

## 2018-01-06 NOTE — Assessment & Plan Note (Addendum)
Status post drug-eluting stent placement to proximal LAD.  He has done fairly well since this is done.  He will continue his current regimen.  He will continue to follow with cardiology.  I discussed that if he were to develop chest pain or other symptoms he needs to be evaluated.  Slight indigestion type discomfort.  May be GI related.  He will monitor.  Message sent to cardiologist regarding reported symptoms.

## 2018-01-06 NOTE — Assessment & Plan Note (Signed)
Noted on recent lab work.  Needs repeat.

## 2018-01-06 NOTE — Assessment & Plan Note (Signed)
Chronic issue.  Discussed physical therapy though he declined.  He will do exercises for this.

## 2018-01-26 ENCOUNTER — Other Ambulatory Visit: Payer: Self-pay

## 2018-01-26 DIAGNOSIS — Z299 Encounter for prophylactic measures, unspecified: Secondary | ICD-10-CM

## 2018-01-26 NOTE — Progress Notes (Signed)
Patient came in to have blood drawn for testing per Dr. Ellen Henri orders.

## 2018-01-27 LAB — BASIC METABOLIC PANEL
BUN / CREAT RATIO: 16 (ref 9–20)
BUN: 14 mg/dL (ref 6–24)
CO2: 19 mmol/L — ABNORMAL LOW (ref 20–29)
Calcium: 9.5 mg/dL (ref 8.7–10.2)
Chloride: 104 mmol/L (ref 96–106)
Creatinine, Ser: 0.9 mg/dL (ref 0.76–1.27)
GFR calc Af Amer: 108 mL/min/{1.73_m2} (ref >59)
GFR, EST NON AFRICAN AMERICAN: 94 mL/min/{1.73_m2} (ref >59)
GLUCOSE: 143 mg/dL — AB (ref 65–99)
Potassium: 5.1 mmol/L (ref 3.5–5.2)
SODIUM: 145 mmol/L — AB (ref 134–144)

## 2018-01-27 LAB — HGB A1C W/O EAG: Hgb A1c MFr Bld: 7.5 % — ABNORMAL HIGH (ref 4.8–5.6)

## 2018-02-01 ENCOUNTER — Other Ambulatory Visit: Payer: Self-pay | Admitting: Family Medicine

## 2018-02-15 ENCOUNTER — Telehealth: Payer: Self-pay | Admitting: Cardiovascular Disease

## 2018-02-15 NOTE — Telephone Encounter (Signed)
Patient has nasal drip and cough is keeping him awake at night.  Please call to advise what patient can take otc to help .

## 2018-02-15 NOTE — Telephone Encounter (Signed)
Spoke with patient and reviewed various over the counter medications which he can try for allergies such as zyrtec. He verbalized understanding, was appreciative for the call, and had no further questions at this time.

## 2018-02-27 ENCOUNTER — Encounter: Payer: Self-pay | Admitting: Family Medicine

## 2018-02-27 ENCOUNTER — Other Ambulatory Visit: Payer: Self-pay

## 2018-02-27 ENCOUNTER — Other Ambulatory Visit: Payer: Self-pay | Admitting: Family Medicine

## 2018-02-27 ENCOUNTER — Ambulatory Visit: Payer: Self-pay | Admitting: *Deleted

## 2018-02-27 ENCOUNTER — Ambulatory Visit: Payer: Managed Care, Other (non HMO) | Admitting: Family Medicine

## 2018-02-27 VITALS — BP 120/82 | HR 73 | Temp 98.0°F | Wt 220.4 lb

## 2018-02-27 DIAGNOSIS — R1010 Upper abdominal pain, unspecified: Secondary | ICD-10-CM | POA: Insufficient documentation

## 2018-02-27 DIAGNOSIS — D72829 Elevated white blood cell count, unspecified: Secondary | ICD-10-CM

## 2018-02-27 DIAGNOSIS — I2510 Atherosclerotic heart disease of native coronary artery without angina pectoris: Secondary | ICD-10-CM | POA: Diagnosis not present

## 2018-02-27 DIAGNOSIS — R748 Abnormal levels of other serum enzymes: Secondary | ICD-10-CM

## 2018-02-27 LAB — COMPREHENSIVE METABOLIC PANEL
ALBUMIN: 4.5 g/dL (ref 3.5–5.2)
ALT: 19 U/L (ref 0–53)
AST: 11 U/L (ref 0–37)
Alkaline Phosphatase: 61 U/L (ref 39–117)
BUN: 13 mg/dL (ref 6–23)
CALCIUM: 9.8 mg/dL (ref 8.4–10.5)
CHLORIDE: 100 meq/L (ref 96–112)
CO2: 25 meq/L (ref 19–32)
Creatinine, Ser: 0.86 mg/dL (ref 0.40–1.50)
GFR: 96.73 mL/min (ref >60.00)
Glucose, Bld: 177 mg/dL — ABNORMAL HIGH (ref 70–99)
POTASSIUM: 4.6 meq/L (ref 3.5–5.1)
Sodium: 135 mEq/L (ref 135–145)
Total Bilirubin: 0.7 mg/dL (ref 0.2–1.2)
Total Protein: 7.4 g/dL (ref 6.0–8.3)

## 2018-02-27 LAB — CBC WITH DIFFERENTIAL/PLATELET
BASOS PCT: 0.6 % (ref 0.0–3.0)
Basophils Absolute: 0.1 10*3/uL (ref 0.0–0.1)
Eosinophils Absolute: 0.1 10*3/uL (ref 0.0–0.7)
Eosinophils Relative: 1 % (ref 0.0–5.0)
HEMATOCRIT: 49.5 % (ref 39.0–52.0)
HEMOGLOBIN: 17.1 g/dL — AB (ref 13.0–17.0)
Lymphocytes Relative: 18.8 % (ref 12.0–46.0)
Lymphs Abs: 2 10*3/uL (ref 0.7–4.0)
MCHC: 34.5 g/dL (ref 30.0–36.0)
MCV: 88 fl (ref 78.0–100.0)
MONOS PCT: 8.8 % (ref 3.0–12.0)
Monocytes Absolute: 0.9 10*3/uL (ref 0.1–1.0)
NEUTROS ABS: 7.5 10*3/uL (ref 1.4–7.7)
Neutrophils Relative %: 70.8 % (ref 43.0–77.0)
PLATELETS: 228 10*3/uL (ref 150.0–400.0)
RBC: 5.63 Mil/uL (ref 4.22–5.81)
RDW: 13.4 % (ref 11.5–15.5)
WBC: 10.6 10*3/uL — AB (ref 4.0–10.5)

## 2018-02-27 LAB — LIPASE: Lipase: 84 U/L — ABNORMAL HIGH (ref 11.0–59.0)

## 2018-02-27 MED ORDER — PANTOPRAZOLE SODIUM 40 MG PO TBEC: 40.0000 mg | DELAYED_RELEASE_TABLET | Freq: Every day | ORAL | 1 refills | Status: DC

## 2018-02-27 NOTE — Telephone Encounter (Signed)
Wife called stating that her husband (the patient), is having abd pain that started on last Tuesday.  This pain as has gotten worse. Woke him up last night. Pain is in his upper abd, nauseous, no vomiting. The pain comes after eating, more so after a big meal. He states the pain is a burning pain. Denies fever, diarrhea, constipation. Home care advice given to patient with verbal understanding.  Appointment made for today per protocol.  Reason for Disposition . [1] MODERATE pain (e.g., interferes with normal activities) AND [2] pain comes and goes (cramps) AND [3] present > 24 hours  (Exception: pain with Vomiting or Diarrhea - see that Guideline)  Answer Assessment - Initial Assessment Questions 1. LOCATION: "Where does it hurt?"      Top of abd 2. RADIATION: "Does the pain shoot anywhere else?" (e.g., chest, back)     no 3. ONSET: "When did the pain begin?" (Minutes, hours or days ago)      Started tues night 4. SUDDEN: "Gradual or sudden onset?"     sudden 5. PATTERN "Does the pain come and go, or is it constant?"    - If constant: "Is it getting better, staying the same, or worsening?"      (Note: Constant means the pain never goes away completely; most serious pain is constant and it progresses)     - If intermittent: "How long does it last?" "Do you have pain now?"     (Note: Intermittent means the pain goes away completely between bouts)     Comes and goes and can last anywhere from 20 mins to a couple of hours 6. SEVERITY: "How bad is the pain?"  (e.g., Scale 1-10; mild, moderate, or severe)    - MILD (1-3): doesn't interfere with normal activities, abdomen soft and not tender to touch     - MODERATE (4-7): interferes with normal activities or awakens from sleep, tender to touch     - SEVERE (8-10): excruciating pain, doubled over, unable to do any normal activities       5-8 pain # 7. RECURRENT SYMPTOM: "Have you ever had this type of abdominal pain before?" If so, ask: "When was  the last time?" and "What happened that time?"      Yes several years ago, did not find out the cause. Had u/s done. 8. CAUSE: "What do you think is causing the abdominal pain?"     Not sure 9. RELIEVING/AGGRAVATING FACTORS: "What makes it better or worse?" (e.g., movement, antacids, bowel movement)     Worse after eating, nothing makes it feel better 10. OTHER SYMPTOMS: "Has there been any vomiting, diarrhea, constipation, or urine problems?"       Nauseous  Protocols used: ABDOMINAL PAIN - MALE-A-AH

## 2018-02-27 NOTE — Progress Notes (Addendum)
Tommi Rumps, MD Phone: [phone]  Charles Hickman is a 57 y.o. male who presents today for same day visit.   Patient notes 6 days of symptoms.  Has upper abdominal discomfort in his right upper quadrant, epigastric region, and left upper quadrant.  Notes this comes and goes.  It is worse at night when he lays down.  Describes it as a burning and gnawing pain.  Does get worse after a meal though last night it occurred when he did not eat very much.  Typically goes away on its own though does occasionally ease off if he lays on his right side or sits up.  He has had no chest pain.  No shortness of breath.  No reflux symptoms.  Some mild nausea though no vomiting.  No diaphoresis.  No diarrhea.  No blood in his stool.  He did have an upper respiratory infection last week and this is improving though he does still have some postnasal drip.  He notes this is different than the discomfort he was having the last time I saw him.  He notes that discomfort that he was having previously resolved with his stent being placed.  He has not been taking any NSAIDs.  He is on Plavix.  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  Social History   Tobacco Use  Smoking Status Current Every Day Smoker  . Packs/day: 0.25  Smokeless Tobacco Never Used  Tobacco Comment   Weating a patch.      ROS see history of present illness  Objective  Physical Exam Vitals:   02/27/18 1101  BP: 120/82  Pulse: 73  Temp: 98 F (36.7 C)  SpO2: 98%    BP Readings from Last 3 Encounters:  02/27/18 120/82  01/06/18 96/60  12/30/17 106/70   Wt Readings from Last 3 Encounters:  02/27/18 220 lb 6.4 oz (100 kg)  01/06/18 234 lb 3.2 oz (106.2 kg)  12/30/17 237 lb (107.5 kg)    Physical Exam  Constitutional: No distress.  Cardiovascular: Normal rate, regular rhythm and normal heart sounds.  Pulmonary/Chest: Effort normal and breath sounds normal.  Abdominal: Soft. Bowel sounds are normal. He exhibits  no distension. There is no tenderness. There is no rebound and no guarding.  Musculoskeletal: He exhibits no edema.  Neurological: He is alert. Gait normal.  Skin: Skin is warm and dry. He is not diaphoretic.   EKG: Normal sinus rhythm, rate 71, no acute changes, similar to prior  Assessment/Plan: Please see individual problem list.  Pain of upper abdomen Patient with about 6 days of upper abdominal discomfort that seems consistent with gastritis or stomach ulcer given description.  He did have a stomach ulcer when he was diagnosed with his B-cell lymphoma.  EGD was previously recommended though has been deferred as he completes at least 6 months of Plavix following stent placement.  Given description I doubt this is related to gallbladder or liver issue or pancreatic issue though we will check a CMP and lipase to rule those out.  We will check a CBC with differential as well.  Will start on a PPI.  Given prior cardiac history EKG was checked to evaluate.  EKG is unchanged from prior.  History is not consistent with a cardiac cause.  We will refer to GI at [facility] to consider EGD when he is able to do this after coming off of Plavix.  Discussed foods to avoid.  Given return precautions.   Orders  Placed This Encounter  Procedures  . Comp Met (CMET)  . CBC w/Diff  . Lipase  . Ambulatory referral to Gastroenterology    Referral Priority:   Routine    Referral Type:   Consultation    Referral Reason:   Specialty Services Required    Number of Visits Requested:   1  . EKG 12-Lead    Meds ordered this encounter  Medications  . pantoprazole (PROTONIX) 40 MG tablet    Sig: Take 1 tablet (40 mg total) by mouth daily.    Dispense:  30 tablet    Refill:  [place], MD [place]

## 2018-02-27 NOTE — Patient Instructions (Signed)
Nice to see you. Please start on the Protonix. Check lab work and contact you with the results. If you develop worsening abdominal pain or you develop chest pain, shortness of breath, blood in your stools, fevers, or any new or changing symptoms please seek medical attention immediately.

## 2018-02-27 NOTE — Assessment & Plan Note (Signed)
Patient with about 6 days of upper abdominal discomfort that seems consistent with gastritis or stomach ulcer given description.  He did have a stomach ulcer when he was diagnosed with his B-cell lymphoma.  EGD was previously recommended though has been deferred as he completes at least 6 months of Plavix following stent placement.  Given description I doubt this is related to gallbladder or liver issue or pancreatic issue though we will check a CMP and lipase to rule those out.  We will check a CBC with differential as well.  Will start on a PPI.  Given prior cardiac history EKG was checked to evaluate.  EKG is unchanged from prior.  History is not consistent with a cardiac cause.  We will refer to GI at [facility] to consider EGD when he is able to do this after coming off of Plavix.  Discussed foods to avoid.  Given return precautions.

## 2018-02-28 ENCOUNTER — Emergency Department
Admission: EM | Admit: 2018-02-28 | Discharge: 2018-02-28 | Disposition: A | Discharge status: Home / Self Care | Payer: Managed Care, Other (non HMO) | Attending: Emergency Medicine | Admitting: Emergency Medicine

## 2018-02-28 ENCOUNTER — Encounter: Payer: Self-pay | Admitting: Emergency Medicine

## 2018-02-28 ENCOUNTER — Other Ambulatory Visit (INDEPENDENT_AMBULATORY_CARE_PROVIDER_SITE_OTHER): Payer: Managed Care, Other (non HMO)

## 2018-02-28 ENCOUNTER — Emergency Department: Payer: Managed Care, Other (non HMO)

## 2018-02-28 ENCOUNTER — Other Ambulatory Visit: Payer: Self-pay

## 2018-02-28 DIAGNOSIS — Z8572 Personal history of non-Hodgkin lymphomas: Secondary | ICD-10-CM | POA: Diagnosis not present

## 2018-02-28 DIAGNOSIS — R110 Nausea: Secondary | ICD-10-CM | POA: Insufficient documentation

## 2018-02-28 DIAGNOSIS — K859 Acute pancreatitis without necrosis or infection, unspecified: Secondary | ICD-10-CM | POA: Diagnosis not present

## 2018-02-28 DIAGNOSIS — Z8673 Personal history of transient ischemic attack (TIA), and cerebral infarction without residual deficits: Secondary | ICD-10-CM | POA: Diagnosis not present

## 2018-02-28 DIAGNOSIS — Z7984 Long term (current) use of oral hypoglycemic drugs: Secondary | ICD-10-CM | POA: Diagnosis not present

## 2018-02-28 DIAGNOSIS — R748 Abnormal levels of other serum enzymes: Secondary | ICD-10-CM | POA: Diagnosis not present

## 2018-02-28 DIAGNOSIS — I2510 Atherosclerotic heart disease of native coronary artery without angina pectoris: Secondary | ICD-10-CM | POA: Insufficient documentation

## 2018-02-28 DIAGNOSIS — E1140 Type 2 diabetes mellitus with diabetic neuropathy, unspecified: Secondary | ICD-10-CM | POA: Insufficient documentation

## 2018-02-28 DIAGNOSIS — Z79899 Other long term (current) drug therapy: Secondary | ICD-10-CM | POA: Diagnosis not present

## 2018-02-28 DIAGNOSIS — Z7982 Long term (current) use of aspirin: Secondary | ICD-10-CM | POA: Insufficient documentation

## 2018-02-28 DIAGNOSIS — I10 Essential (primary) hypertension: Secondary | ICD-10-CM | POA: Diagnosis not present

## 2018-02-28 DIAGNOSIS — F17210 Nicotine dependence, cigarettes, uncomplicated: Secondary | ICD-10-CM | POA: Insufficient documentation

## 2018-02-28 DIAGNOSIS — Z7902 Long term (current) use of antithrombotics/antiplatelets: Secondary | ICD-10-CM | POA: Insufficient documentation

## 2018-02-28 DIAGNOSIS — D72829 Elevated white blood cell count, unspecified: Secondary | ICD-10-CM

## 2018-02-28 DIAGNOSIS — R1010 Upper abdominal pain, unspecified: Secondary | ICD-10-CM | POA: Diagnosis present

## 2018-02-28 LAB — CBC WITH DIFFERENTIAL/PLATELET
BASOS ABS: 0.1 10*3/uL (ref 0.0–0.1)
BASOS ABS: 0.1 10*3/uL (ref 0–0.1)
BASOS PCT: 1 %
Basophils Relative: 0.7 % (ref 0.0–3.0)
EOS ABS: 0.2 10*3/uL (ref 0.0–0.7)
EOS ABS: 0.2 10*3/uL (ref 0–0.7)
Eosinophils Relative: 1.9 % (ref 0.0–5.0)
Eosinophils Relative: 2 %
HCT: 46.5 % (ref 40.0–52.0)
HEMATOCRIT: 48.8 % (ref 39.0–52.0)
Hemoglobin: 17.1 g/dL — ABNORMAL HIGH (ref 13.0–17.0)
Hemoglobin: 15.9 g/dL (ref 13.0–18.0)
LYMPHS PCT: 20.1 % (ref 12.0–46.0)
Lymphocytes Relative: 17 %
Lymphs Abs: 1.9 10*3/uL (ref 0.7–4.0)
Lymphs Abs: 1.5 10*3/uL (ref 1.0–3.6)
MCH: 29.6 pg (ref 26.0–34.0)
MCHC: 35 g/dL (ref 30.0–36.0)
MCHC: 34.2 g/dL (ref 32.0–36.0)
MCV: 87.4 fl (ref 78.0–100.0)
MCV: 86.5 fL (ref 80.0–100.0)
MONO ABS: 0.9 10*3/uL (ref 0.2–1.0)
MONOS PCT: 9.6 % (ref 3.0–12.0)
Monocytes Absolute: 0.9 10*3/uL (ref 0.1–1.0)
Monocytes Relative: 10 %
NEUTROS PCT: 67.7 % (ref 43.0–77.0)
Neutro Abs: 6.3 10*3/uL (ref 1.4–7.7)
Neutro Abs: 6.3 10*3/uL (ref 1.4–6.5)
Neutrophils Relative %: 70 %
PLATELETS: 202 10*3/uL (ref 150–440)
Platelets: 237 10*3/uL (ref 150.0–400.0)
RBC: 5.59 Mil/uL (ref 4.22–5.81)
RBC: 5.37 MIL/uL (ref 4.40–5.90)
RDW: 13.4 % (ref 11.5–15.5)
RDW: 13.4 % (ref 11.5–14.5)
WBC: 9.4 10*3/uL (ref 4.0–10.5)
WBC: 8.9 10*3/uL (ref 3.8–10.6)

## 2018-02-28 LAB — COMPREHENSIVE METABOLIC PANEL
ALT: 19 U/L (ref 17–63)
AST: 28 U/L (ref 15–41)
Albumin: 3.9 g/dL (ref 3.5–5.0)
Alkaline Phosphatase: 60 U/L (ref 38–126)
Anion gap: 10 (ref 5–15)
BILIRUBIN TOTAL: 0.8 mg/dL (ref 0.3–1.2)
BUN: 13 mg/dL (ref 6–20)
CALCIUM: 8.6 mg/dL — AB (ref 8.9–10.3)
CHLORIDE: 103 mmol/L (ref 101–111)
CO2: 21 mmol/L — ABNORMAL LOW (ref 22–32)
CREATININE: 1.04 mg/dL (ref 0.61–1.24)
Glucose, Bld: 301 mg/dL — ABNORMAL HIGH (ref 65–99)
Potassium: 4.2 mmol/L (ref 3.5–5.1)
Sodium: 134 mmol/L — ABNORMAL LOW (ref 135–145)
TOTAL PROTEIN: 6.7 g/dL (ref 6.5–8.1)

## 2018-02-28 LAB — URINALYSIS, COMPLETE (UACMP) WITH MICROSCOPIC
BILIRUBIN URINE: NEGATIVE
Bacteria, UA: NONE SEEN
HGB URINE DIPSTICK: NEGATIVE
Ketones, ur: NEGATIVE mg/dL
LEUKOCYTES UA: NEGATIVE
NITRITE: NEGATIVE
Protein, ur: NEGATIVE mg/dL
SPECIFIC GRAVITY, URINE: 1.029 (ref 1.005–1.030)
Squamous Epithelial / LPF: NONE SEEN
pH: 5 (ref 5.0–8.0)

## 2018-02-28 LAB — LIPASE, BLOOD: Lipase: 76 U/L — ABNORMAL HIGH (ref 11–51)

## 2018-02-28 LAB — TROPONIN I: Troponin I: <0.03 ng/mL (ref <0.03)

## 2018-02-28 LAB — LACTATE DEHYDROGENASE: LDH: 156 U/L (ref 98–192)

## 2018-02-28 LAB — LIPASE: Lipase: 90 U/L — ABNORMAL HIGH (ref 11.0–59.0)

## 2018-02-28 MED ORDER — HYDROCODONE-ACETAMINOPHEN 5-325 MG PO TABS: 1.0000 | ORAL_TABLET | ORAL | 0 refills | Status: DC | PRN

## 2018-02-28 MED ORDER — IOPAMIDOL (ISOVUE-300) INJECTION 61%
100.0000 mL | Freq: Once | INTRAVENOUS | Status: AC | PRN
  Administered 2018-02-28: 100 mL via INTRAVENOUS

## 2018-02-28 MED ORDER — HYDROCODONE-ACETAMINOPHEN 5-325 MG PO TABS
1.0000 | ORAL_TABLET | Freq: Once | ORAL | Status: AC
  Administered 2018-02-28: 1 via ORAL

## 2018-02-28 MED ORDER — GI COCKTAIL ~~LOC~~
30.0000 mL | Freq: Once | ORAL | Status: AC
  Administered 2018-02-28: 30 mL via ORAL
  Filled 2018-02-28: qty 30

## 2018-02-28 MED ORDER — IOPAMIDOL (ISOVUE-300) INJECTION 61%
30.0000 mL | Freq: Once | INTRAVENOUS | Status: AC
  Administered 2018-02-28: 30 mL via ORAL

## 2018-02-28 MED ORDER — HYDROCODONE-ACETAMINOPHEN 5-325 MG PO TABS
ORAL_TABLET | ORAL | Status: AC
  Filled 2018-02-28: qty 1

## 2018-02-28 NOTE — Discharge Instructions (Signed)
As we discussed please stick to a clear liquid diet for the next 3 days.  You may slowly add in non-fatty/oily foods after that.  Avoid any fatty or oily foods or alcohol for the next 2 weeks.  Please follow-up with your doctor within the next 1 week for recheck/reevaluation.

## 2018-02-28 NOTE — ED Notes (Signed)
Ct made aware that patient had finished contrast

## 2018-02-28 NOTE — ED Notes (Signed)
Reviewed pt's chart with Dr Quentin Cornwall, order obtained for IV and troponin at this time

## 2018-02-28 NOTE — ED Triage Notes (Addendum)
Patient ambulatory to triage with steady gait, without difficulty or distress noted; pt reports upper abd "burning" pain x week at times radiating at times thru to back accomp by nausea; st hx of similar pain many years ago and U/S was WNL; had blood work today and told lipase was elevated; labs in chart from this am

## 2018-02-28 NOTE — ED Provider Notes (Signed)
[facility] Emergency Department Provider Note  Time seen: 8:40 PM  I have reviewed the triage vital signs and the nursing notes.   HISTORY  Chief Complaint Abdominal Pain    HPI Charles Hickman is a 57 y.o. male with a past medical history of CAD, diabetes, hyperlipidemia, hypertension, prior CVA, presents to the emergency department for upper abdominal pain.  According to the patient for the past 1 week he has been experiencing a fairly constant upper abdominal burning sensation.  States it is somewhat worse when he eats.  States the severity has been waxing and waning but always present.  Describes as a burning sensation.  States occasional nausea but denies any vomiting.  Denies diarrhea.  Denies dysuria fever cough or congestion.  Patient was seen by his doctor yesterday had blood work performed showing mild elevation of his lipase.  Patient does state occasional alcohol use but limited to the weekends.  States he had similar pains 3 or 4 years ago but ultimately the pain went away on its own.  Has his gallbladder but denies any right upper quadrant pain.    Past Medical History:  Diagnosis Date  . CAD in native artery    a. 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 No EKG changes concerning for ischemia at peak stress or in recovery. Mod to high risk scan; b. LHC 12/15/17: pLAD 75% w/ FFR 0.74 s/p PCI/DES, mRCA 90%, CTO dRCA w/ L-R colatts, EF 45-50%, inf HK   . Diabetes mellitus ([place])   . HCV (hepatitis C virus)   . HLD (hyperlipidemia)   . Hypertension   . Large cell lymphoma ([place])    a. s/p 6 cycles of R-CHOP  . Peripheral neuropathy   . Stroke ([place])   . Systolic dysfunction    a. TTE 8/16: EF > 55%, aortic sclerosis, normal RV systolic function; b. LHC 12/15/17: EF 45-50%, inf HK    Patient Active Problem List    Diagnosis Date Noted  . Pain of upper abdomen 02/27/2018  . Hypocalcemia 01/06/2018  . Chronic low back pain 01/06/2018  . CAD (coronary artery disease), native coronary artery 12/29/2017  . Systolic dysfunction 20/25/4270  . Tobacco abuse 12/16/2017  . HLD (hyperlipidemia) 12/16/2017  . Abnormal stress test   . Abnormal EKG 11/15/2017  . Essential hypertension 10/03/2017  . Diabetes ([place]) 10/03/2017  . Chest pain 10/03/2017  . Diffuse large B-cell lymphoma ([place]) 10/03/2017  . Stress 10/03/2017    Past Surgical History:  Procedure Laterality Date  . APPENDECTOMY    . INTRAVASCULAR PRESSURE WIRE/FFR STUDY N/A 12/15/2017   Procedure: INTRAVASCULAR PRESSURE WIRE/FFR STUDY;  Surgeon: Wellington Hampshire, MD;  Location: [place] [facility];  Service: Cardiovascular;  Laterality: N/A;  . LEFT HEART CATH AND CORONARY ANGIOGRAPHY N/A 12/15/2017   Procedure: LEFT HEART CATH AND CORONARY ANGIOGRAPHY;  Surgeon: Wellington Hampshire, MD;  Location: [place] [facility];  Service: Cardiovascular;  Laterality: N/A;  . TONSILLECTOMY AND ADENOIDECTOMY      Prior to Admission medications   Medication Sig Start Date End Date Taking? Authorizing Provider  aspirin EC 81 MG tablet Take 81 mg by mouth daily.   Yes [provider]  clopidogrel (PLAVIX) 75 MG tablet Take 1 tablet (75 mg total) by mouth daily with breakfast. 12/17/17  Yes  Rise Mu, PA-C  JARDIANCE 10 MG TABS tablet TAKE 1 TABLET BY MOUTH DAILY1 02/01/18  Yes Leone Haven, MD  lisinopril (PRINIVIL,ZESTRIL) 10 MG tablet Take 0.5 tablets (5 mg total) by mouth 2 (two) times daily. 10/03/17  Yes Leone Haven, MD  metoprolol tartrate (LOPRESSOR) 25 MG tablet Take 0.5 tablets (12.5 mg total) by mouth 2 (two) times daily. 12/16/17  Yes Dunn, Areta Haber, PA-C  pantoprazole (PROTONIX) 40 MG tablet Take 1 tablet (40 mg total) by mouth daily. 02/27/18  Yes Leone Haven, MD  rosuvastatin (CRESTOR) 20 MG tablet Take 1 tablet (20 mg  total) by mouth daily. 10/12/17  Yes Leone Haven, MD  nitroGLYCERIN (NITROSTAT) 0.4 MG SL tablet Place 1 tablet (0.4 mg total) under the tongue every 5 (five) minutes as needed for chest pain. 12/16/17   Rise Mu, PA-C    No Known Allergies  Family History  Problem Relation Age of Onset  . Hypertension Mother   . Hyperlipidemia Mother   . Stroke Mother   . Diabetes Mother   . Kidney disease Father   . Hypertension Father   . Diabetes Father   . Dementia Maternal Grandmother     Social History Social History   Tobacco Use  . Smoking status: Current Every Day Smoker    Packs/day: 0.25  . Smokeless tobacco: Never Used  . Tobacco comment: Weating a patch.   Substance Use Topics  . Alcohol use: Yes    Comment: occasional  . Drug use: No    Review of Systems Constitutional: Negative for fever. Eyes: Negative for visual complaints ENT: Negative for recent illness/congestion Cardiovascular: Negative for chest pain. Respiratory: Negative for shortness of breath. Gastrointestinal: Upper abdominal discomfort, burning.  Positive for nausea.  Negative for vomiting or diarrhea Genitourinary: Negative for urinary compaints Musculoskeletal: Negative for musculoskeletal complaints Skin: Negative for skin complaints  Neurological: Negative for headache All other ROS negative  ____________________________________________   PHYSICAL EXAM:  VITAL SIGNS: ED Triage Vitals [02/28/18 1955]  Enc Vitals Group     BP 130/75     Pulse Rate 98     Resp 18     Temp 98.2 F (36.8 C)     Temp Source Oral     SpO2 100 %     Weight 220 lb (99.8 kg)     Height 5\' 10"  (1.778 m)     Head Circumference      Peak Flow      Pain Score      Pain Loc      Pain Edu?      Excl. in [place]?     Constitutional: Alert and oriented. Well appearing and in no distress. Eyes: Normal exam ENT   Head: Normocephalic and atraumatic.   Mouth/Throat: Mucous membranes are  moist. Cardiovascular: Normal rate, regular rhythm. No murmur Respiratory: Normal respiratory effort without tachypnea nor retractions. Breath sounds are clear Gastrointestinal: Soft, mild epigastric tenderness, otherwise benign abdominal exam.  No right upper quadrant tenderness.  No rebound or guarding.  No distention. Musculoskeletal: Nontender with normal range of motion in all extremities.  Neurologic:  Normal speech and language. No gross focal neurologic deficits  Skin:  Skin is warm, dry and intact.  Psychiatric: Mood and affect are normal. Speech and behavior are normal.   ____________________________________________    EKG  EKG reviewed and interpreted by myself shows normal sinus rhythm 84 bpm with a narrow QRS, normal axis, normal intervals,  nonspecific ST changes without ST elevation.  Occasional PVC.  ____________________________________________    RADIOLOGY  CT shows mild acute uncomplicated pancreatitis  ____________________________________________   INITIAL IMPRESSION / ASSESSMENT AND PLAN / ED COURSE  Pertinent labs & imaging results that were available during my care of the patient were reviewed by me and considered in my medical decision making (see chart for details).  The patient presents to the emergency department for upper abdominal pain described as burning ongoing for the past 1 week.  Differential would include gastritis, gastric ulcers, peptic ulcers, pancreatitis, gallbladder disease.  We will recheck labs in the emergency department, treat with a GI cocktail.  Given the pain has been ongoing for 1 week with only very mild lipase elevations we will proceed with a CT scan of the abdomen/pelvis to further evaluate and rule out other concerning causes of abdominal pain.  Patient agreeable to this plan of care.  Reassuringly patient's vitals are largely normal with slight tachycardia.  Overall the patient appears very well with only mild epigastric tenderness  on exam.  CT shows mild acute uncomplicated pancreatitis.  Labs consistent with the same.  I discussed these results with the patient.  I discussed doing a liquid diet for the next 3 days and then slowly progressing his diet we will treat with pain medication to be used as needed.  Patient agreeable to this plan of care.  ____________________________________________   FINAL CLINICAL IMPRESSION(S) / ED DIAGNOSES  Epigastric pain Pancreatitis   Harvest Dark, MD 02/28/18 2242

## 2018-03-01 ENCOUNTER — Telehealth: Payer: Self-pay | Admitting: Family Medicine

## 2018-03-01 NOTE — Telephone Encounter (Signed)
Please follow-up with the patient to see how he is doing following his ED evaluation for his abdominal pain.  We should try to get him in on Friday or early next week for reevaluation.  Thanks.

## 2018-03-02 NOTE — Telephone Encounter (Signed)
Patient notified and verbalized understanding. 

## 2018-03-02 NOTE — Telephone Encounter (Signed)
Noted.  If his pain worsens he needs to be evaluated in the emergency department again.  If it is not improving over the next day or so he needs to be evaluated in the ED again as well.  Thanks.

## 2018-03-02 NOTE — Telephone Encounter (Signed)
Patient states he is still experiencing pain, he states he received pain meds but he is trying not to take it. He is on a complete liquid diet. I have scheduled patient for Monday at 3:15

## 2018-03-06 ENCOUNTER — Encounter: Payer: Self-pay | Admitting: Family Medicine

## 2018-03-06 ENCOUNTER — Ambulatory Visit: Payer: Managed Care, Other (non HMO) | Admitting: Family Medicine

## 2018-03-06 VITALS — BP 106/76 | HR 75 | Temp 97.5°F | Wt 219.4 lb

## 2018-03-06 DIAGNOSIS — K859 Acute pancreatitis without necrosis or infection, unspecified: Secondary | ICD-10-CM

## 2018-03-06 DIAGNOSIS — E785 Hyperlipidemia, unspecified: Secondary | ICD-10-CM | POA: Diagnosis not present

## 2018-03-06 DIAGNOSIS — K760 Fatty (change of) liver, not elsewhere classified: Secondary | ICD-10-CM | POA: Diagnosis not present

## 2018-03-06 DIAGNOSIS — Z8619 Personal history of other infectious and parasitic diseases: Secondary | ICD-10-CM | POA: Diagnosis not present

## 2018-03-06 NOTE — Patient Instructions (Signed)
Nice to see you. I am glad you are doing better. We will plan on checking fasting cholesterol panel once you are over your acute illness.  If you have recurrence of your symptoms or they worsen please be reevaluated.

## 2018-03-06 NOTE — Progress Notes (Signed)
Tommi Rumps, MD Phone: [phone]  Charles Hickman is a 57 y.o. male who presents today for follow-up.  Patient seen in the office last week for epigastric pain noted to have mildly elevated lipase.  Symptoms did not improve and lipase remained elevated.  Subsequently evaluated in the ED with CT scan revealing mild pancreatitis.  Also fatty liver.  No history of gallstones.  He had been eating anything and everything prior to the event.  Also had alcohol the weekend prior to the pancreatitis.  Was a moderate to heavy drinker in the past.  He notes he did a clear liquid diet and his symptoms have been improving.  Last had pain 2 nights ago.  He has had looser stools with no blood.  No nausea or vomiting.  No fevers.  CT imaging did reveal fatty liver for which she states he has been evaluated previously.  He was previously treated for hepatitis C and reports he had successful treatment for this.  Does not appear to have followed up with GI since 2016 when he completed treatment for hepatitis C.  Social History   Tobacco Use  Smoking Status Current Every Day Smoker  . Packs/day: 0.25  Smokeless Tobacco Never Used  Tobacco Comment   Weating a patch.      ROS see history of present illness  Objective  Physical Exam Vitals:   03/06/18 1529  BP: 106/76  Pulse: 75  Temp: (!) 97.5 F (36.4 C)  SpO2: 97%    BP Readings from Last 3 Encounters:  03/06/18 106/76  02/28/18 122/80  02/27/18 120/82   Wt Readings from Last 3 Encounters:  03/06/18 219 lb 6.4 oz (99.5 kg)  02/28/18 220 lb (99.8 kg)  02/27/18 220 lb 6.4 oz (100 kg)    Physical Exam  Constitutional: No distress.  Cardiovascular: Normal rate, regular rhythm and normal heart sounds.  Pulmonary/Chest: Effort normal and breath sounds normal.  Abdominal: Soft. Bowel sounds are normal. He exhibits no distension.  Very minimal discomfort on palpation of epigastric region, no guarding or rebound  Musculoskeletal: He  exhibits no edema.  Neurological: He is alert. Gait normal.  Skin: Skin is warm and dry. He is not diaphoretic.     Assessment/Plan: Please see individual problem list.  Pancreatitis Patient with acute pancreatitis that has been improving.  Symptoms overall much better.  He is slowly advancing his diet without recurrent symptoms.  Discussed continuing this slowly.  Potentially could have been exacerbated by alcohol intake.  He has a history of high triglycerides though not into the range one would expect to cause pancreatitis.  He is currently on Crestor.  He will monitor his symptoms.  We will plan to check a fasting lipid panel at his next visit.  He is given return precautions.  Fatty liver Previously followed by GI.  It has been sometime since he has seen them.  He will likely need an EGD to follow-up on prior gastric cancer though he wants to defer this to his oncologist.  Once he gets through this current acute issue would consider referral back to GI.  HLD (hyperlipidemia) Continue Crestor.  He will return for fasting lipid panel at his next visit.  History of hepatitis C Appears to have been successfully treated.  We will need to get him back in to see GI once his acute issues have resolved.  Patient would like to defer this at this time.    Orders Placed This Encounter  Procedures  .  Lipid panel    Standing Status:   Future    Standing Expiration Date:   03/07/2019    No orders of the defined types were placed in this encounter.    Tommi Rumps, MD [facility]

## 2018-03-06 NOTE — Assessment & Plan Note (Signed)
Patient with acute pancreatitis that has been improving.  Symptoms overall much better.  He is slowly advancing his diet without recurrent symptoms.  Discussed continuing this slowly.  Potentially could have been exacerbated by alcohol intake.  He has a history of high triglycerides though not into the range one would expect to cause pancreatitis.  He is currently on Crestor.  He will monitor his symptoms.  We will plan to check a fasting lipid panel at his next visit.  He is given return precautions.

## 2018-03-06 NOTE — Assessment & Plan Note (Signed)
Appears to have been successfully treated.  We will need to get him back in to see GI once his acute issues have resolved.  Patient would like to defer this at this time.

## 2018-03-06 NOTE — Assessment & Plan Note (Signed)
Continue Crestor.  He will return for fasting lipid panel at his next visit.

## 2018-03-06 NOTE — Assessment & Plan Note (Signed)
Previously followed by GI.  It has been sometime since he has seen them.  He will likely need an EGD to follow-up on prior gastric cancer though he wants to defer this to his oncologist.  Once he gets through this current acute issue would consider referral back to GI.

## 2018-03-26 IMAGING — CR DG CHEST 2V
1 series · 2 of 2 positions shown · non-contrast
Comparison: None.

CLINICAL DATA: Sudden onset of palpitations.

EXAM:
CHEST  2 VIEW

[Series 1: dg chest 2 view · 0.14mm/px · 2 of 2 slices shown]
[im 1/2]
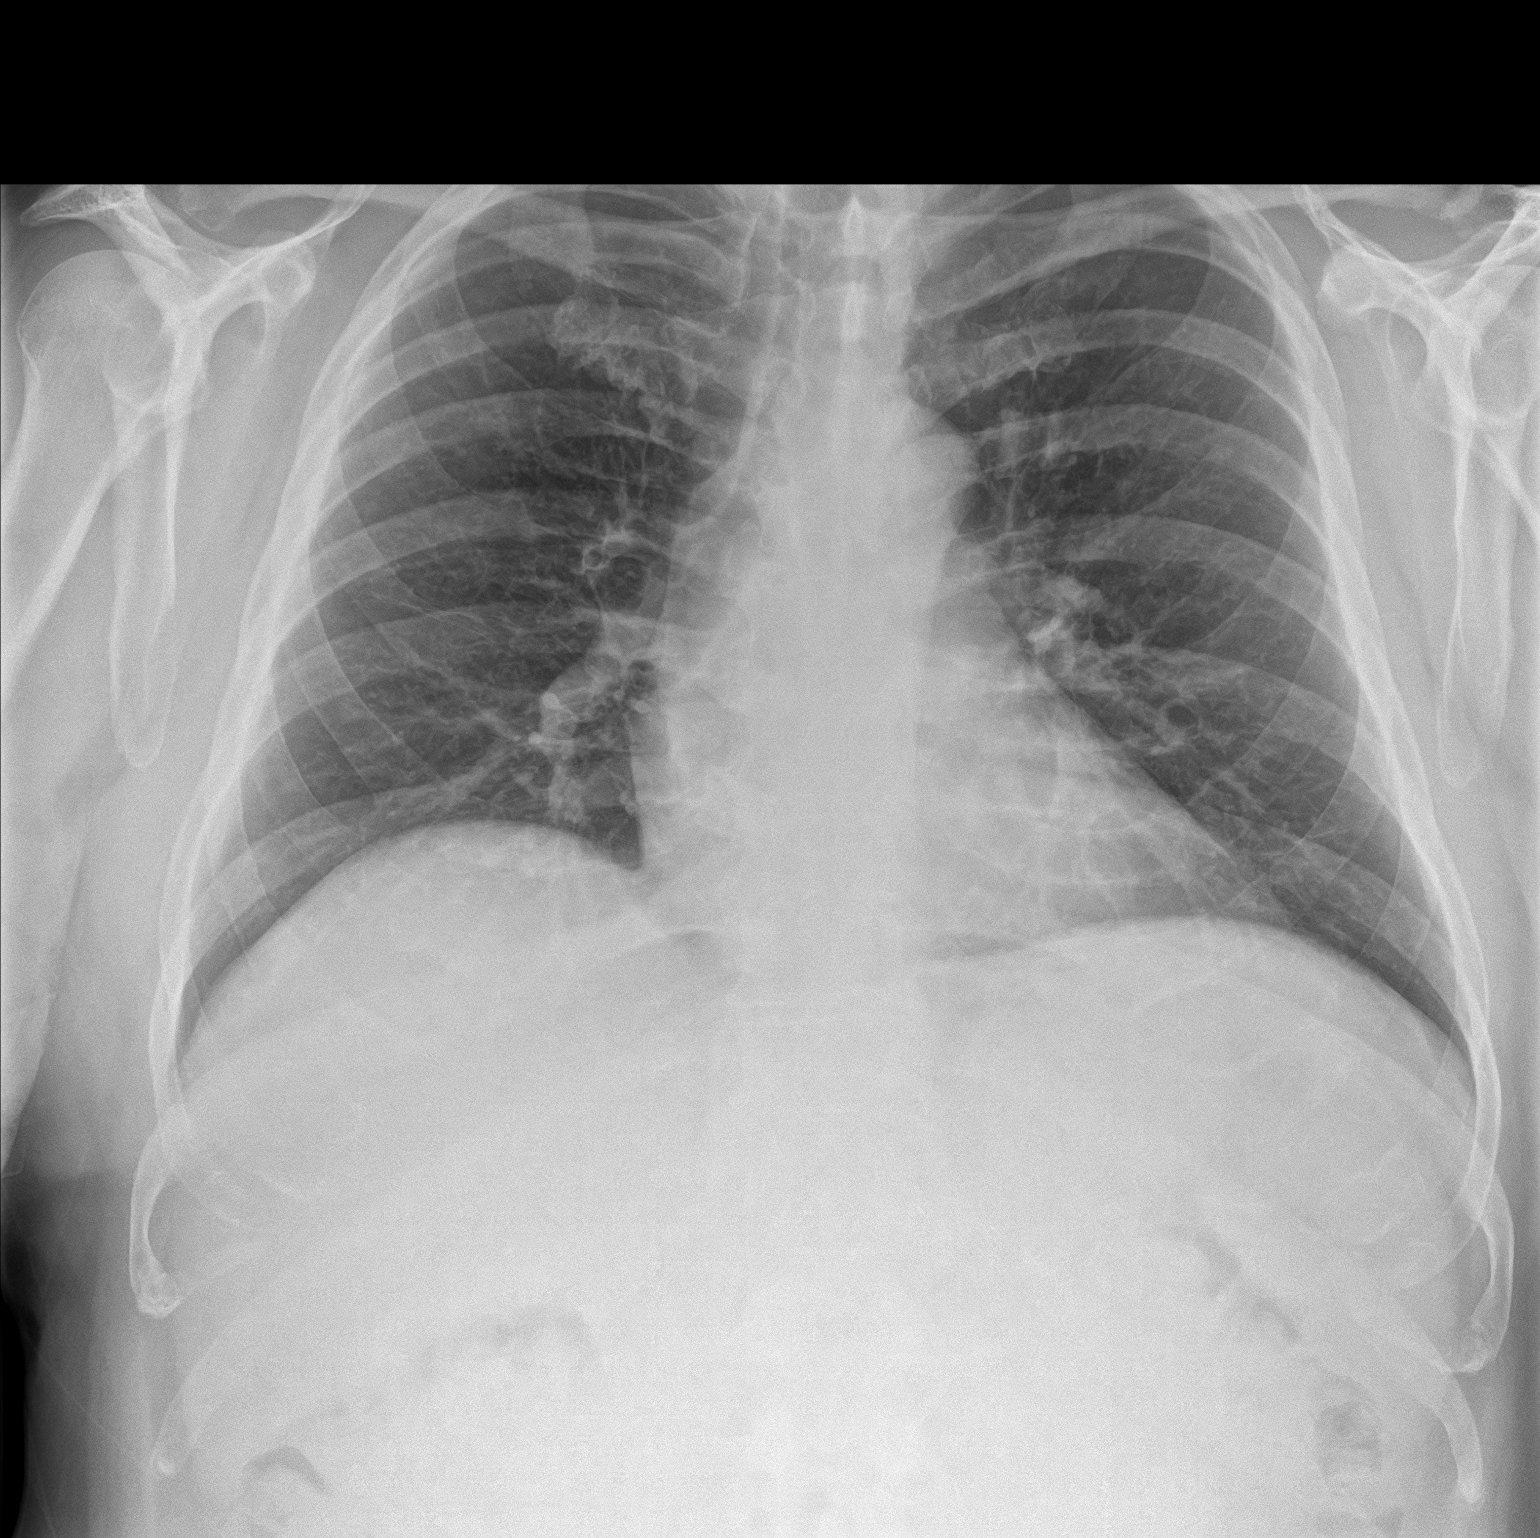
[im 2/2]
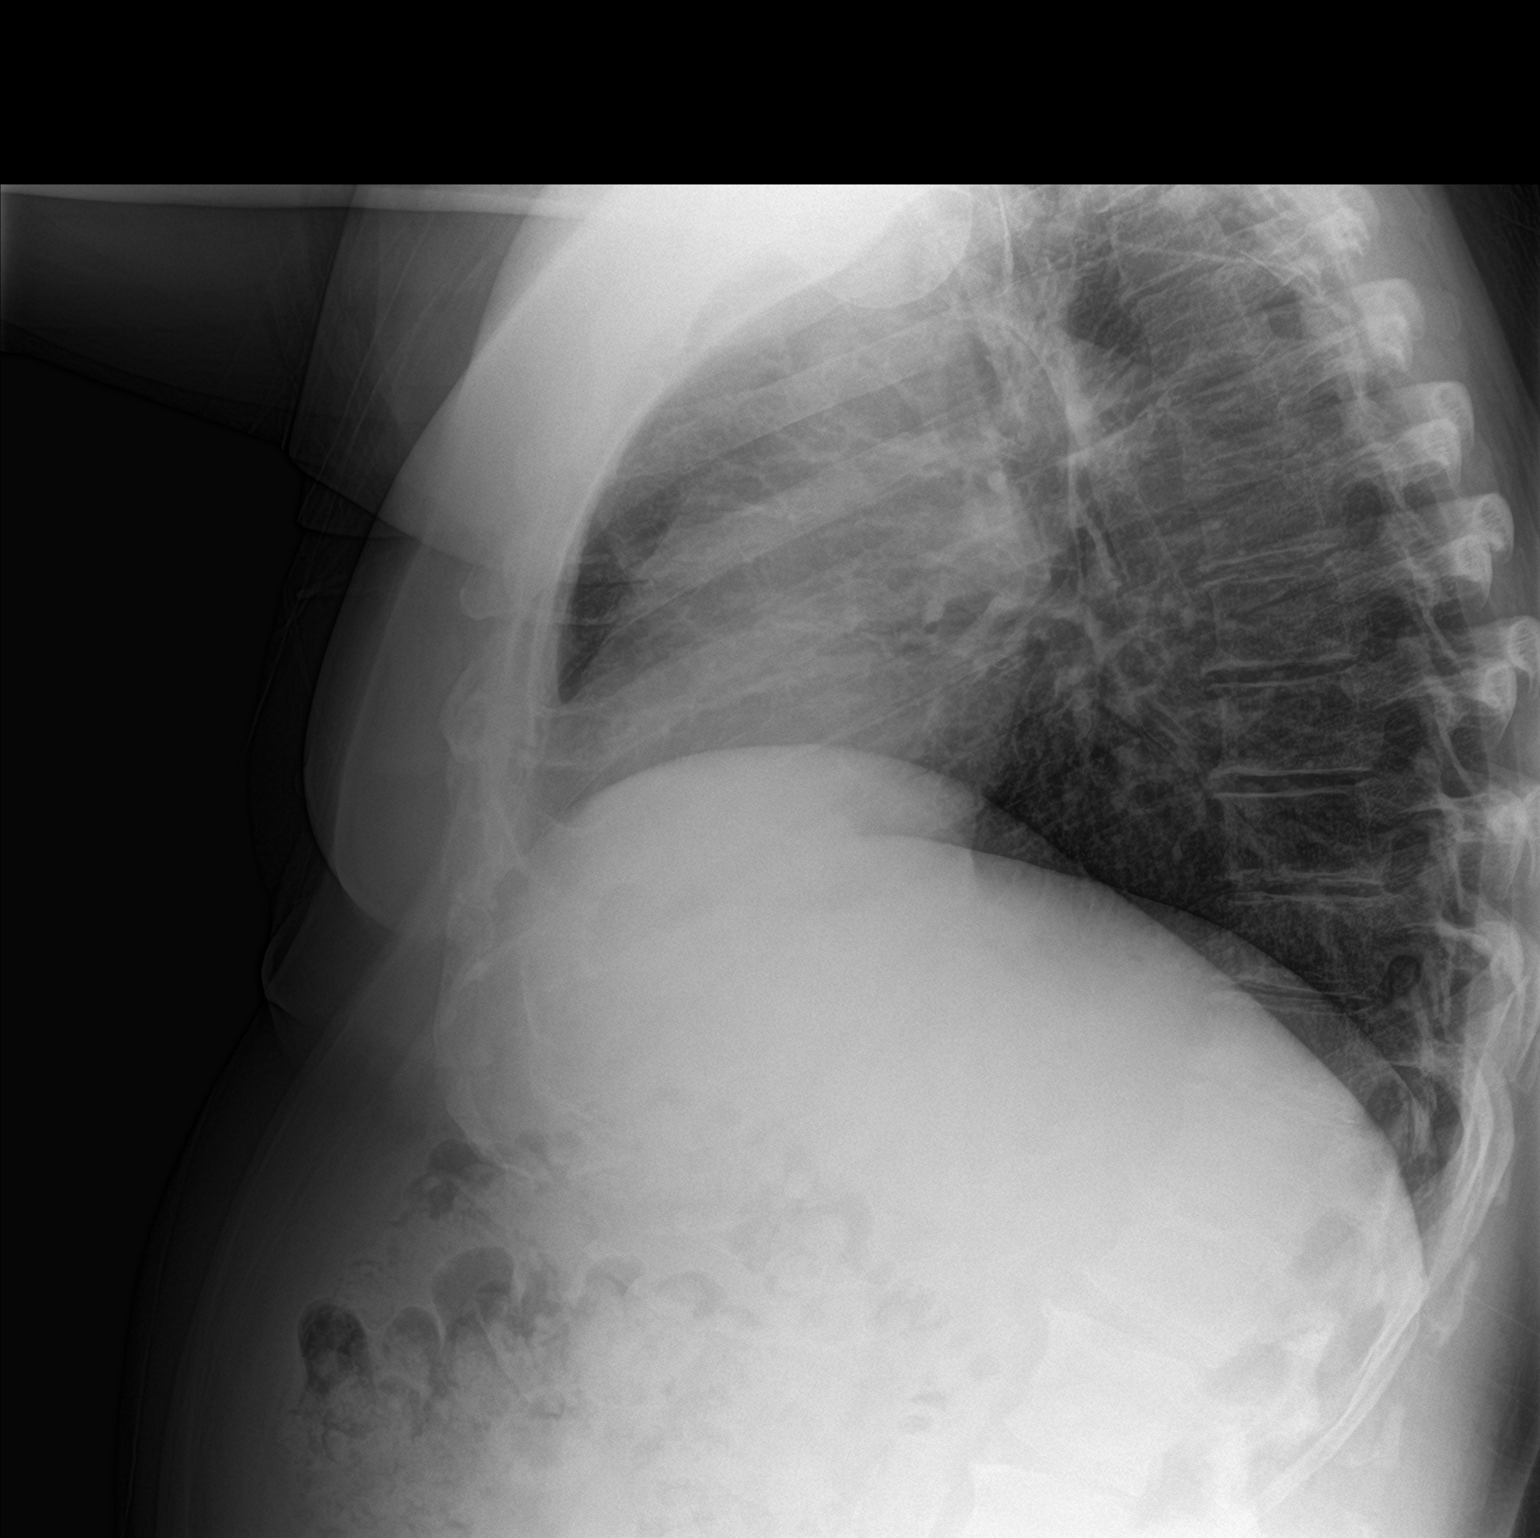

[2 of 2 positions shown; findings below may reference images not displayed]

FINDINGS: Both lungs are clear. Heart and mediastinum are within normal
limits. No pleural effusions. Trachea is midline. No acute bone
abnormality.
IMPRESSION: No active cardiopulmonary disease.

## 2018-03-30 ENCOUNTER — Ambulatory Visit: Payer: Self-pay | Admitting: Family Medicine

## 2018-04-01 ENCOUNTER — Other Ambulatory Visit: Payer: Self-pay | Admitting: Family Medicine

## 2018-04-06 ENCOUNTER — Ambulatory Visit: Payer: Self-pay | Admitting: Family Medicine

## 2018-04-09 ENCOUNTER — Other Ambulatory Visit: Payer: Self-pay | Admitting: Family Medicine

## 2018-04-09 DIAGNOSIS — R1010 Upper abdominal pain, unspecified: Secondary | ICD-10-CM

## 2018-05-10 ENCOUNTER — Ambulatory Visit: Payer: Managed Care, Other (non HMO) | Admitting: Family Medicine

## 2018-05-10 ENCOUNTER — Encounter: Payer: Self-pay | Admitting: Family Medicine

## 2018-05-10 DIAGNOSIS — E782 Mixed hyperlipidemia: Secondary | ICD-10-CM

## 2018-05-10 DIAGNOSIS — Z720 Tobacco use: Secondary | ICD-10-CM

## 2018-05-10 DIAGNOSIS — I10 Essential (primary) hypertension: Secondary | ICD-10-CM | POA: Diagnosis not present

## 2018-05-10 DIAGNOSIS — E1159 Type 2 diabetes mellitus with other circulatory complications: Secondary | ICD-10-CM

## 2018-05-10 DIAGNOSIS — K760 Fatty (change of) liver, not elsewhere classified: Secondary | ICD-10-CM | POA: Diagnosis not present

## 2018-05-10 MED ORDER — NICOTINE 14 MG/24HR TD PT24: 14.0000 mg | MEDICATED_PATCH | Freq: Every day | TRANSDERMAL | 0 refills | Status: DC

## 2018-05-10 NOTE — Progress Notes (Signed)
Charles Rumps, MD Phone: [phone]  MORLEY GAUMER is a 57 y.o. male who presents today for f/u.  CC: dm, htn, hld  HYPERTENSION Disease Monitoring: Blood pressure range-<120/80 Chest pain- no      Dyspnea- no Medications: Compliance- taking lisinopril, metoprolol   Edema- rare at end of day, goes down over night, no orthopnea or PND  DIABETES Disease Monitoring: Blood Sugar ranges-not checking Polyuria/phagia/dipsia- some with jardiance      Optho- reports exam 6 months ago Medications: Compliance- taking jardiance Hypoglycemic symptoms-no Report neuropathy symptoms with burning and tingling in his bilateral feet.  He is tried gabapentin and another medication for this previously with little benefit.  HYPERLIPIDEMIA Disease Monitoring: See symptoms for Hypertension Medications: Compliance-taking Crestor right upper quadrant pain-no muscle aches-no  Patient had a colonoscopy in 2016.  Noted to have fatty liver on recent CT scan abdomen and pelvis.  Recommend he follow-up with GI.  He is supposed to see oncology soon as well for follow-up of his lymphoma.  Patient smokes half a pack per day.  He is ready to quit.  He is interested in patches.  Social History   Tobacco Use  Smoking Status Current Every Day Smoker  . Packs/day: 0.25  Smokeless Tobacco Never Used  Tobacco Comment   Weating a patch.      ROS see history of present illness  Objective  Physical Exam Vitals:   05/10/18 1342  BP: 120/70  Pulse: 64  Temp: 98.2 F (36.8 C)  SpO2: 98%    BP Readings from Last 3 Encounters:  05/10/18 120/70  03/06/18 106/76  02/28/18 122/80   Wt Readings from Last 3 Encounters:  05/10/18 222 lb 3.2 oz (100.8 kg)  03/06/18 219 lb 6.4 oz (99.5 kg)  02/28/18 220 lb (99.8 kg)    Physical Exam  Constitutional: No distress.  Cardiovascular: Normal rate, regular rhythm and normal heart sounds.  Pulmonary/Chest: Effort normal and breath sounds normal.    Abdominal: Soft. Bowel sounds are normal. He exhibits no distension. There is no tenderness. There is no rebound and no guarding.  Musculoskeletal: He exhibits no edema.  Neurological: He is alert.  Skin: Skin is warm and dry. He is not diaphoretic.   Diabetic Foot Exam - Simple   Simple Foot Form Diabetic Foot exam was performed with the following findings:  Yes 05/10/2018  2:15 PM  Visual Inspection No deformities, no ulcerations, no other skin breakdown bilaterally:  Yes Sensation Testing Intact to touch and monofilament testing bilaterally:  Yes Pulse Check Posterior Tibialis and Dorsalis pulse intact bilaterally:  Yes Comments      Assessment/Plan: Please see individual problem list.  Essential hypertension Controlled.  Continue current regimen.  Written prescription given for lab work.  Fatty liver Encouraged patient to see GI.  He wants to hold off until he sees his oncologist and then determine if he would like to see GI.  Diabetes ([place]) Continue Jardiance.  Written prescription given for labs.  Foot exam completed.  He will monitor his feet closely.  HLD (hyperlipidemia) Continue Crestor.  Written prescription given for labs.  Tobacco abuse Patient is ready to quit smoking.  He has done the patches previously with fairly good benefit.  Patches sent to pharmacy.   No orders of the defined types were placed in this encounter.   Meds ordered this encounter  Medications  . nicotine (NICODERM CQ - DOSED IN MG/24 HOURS) 14 mg/24hr patch    Sig: Place 1 patch (  14 mg total) onto the skin daily.    Dispense:  42 patch    Refill:  0     Charles Rumps, MD [place]

## 2018-05-10 NOTE — Assessment & Plan Note (Signed)
Encouraged patient to see GI.  He wants to hold off until he sees his oncologist and then determine if he would like to see GI.

## 2018-05-10 NOTE — Assessment & Plan Note (Signed)
Continue Crestor.  Written prescription given for labs.

## 2018-05-10 NOTE — Assessment & Plan Note (Signed)
Controlled.  Continue current regimen.  Written prescription given for lab work.

## 2018-05-10 NOTE — Assessment & Plan Note (Signed)
Patient is ready to quit smoking.  He has done the patches previously with fairly good benefit.  Patches sent to pharmacy.

## 2018-05-10 NOTE — Assessment & Plan Note (Signed)
Continue Jardiance.  Written prescription given for labs.  Foot exam completed.  He will monitor his feet closely.

## 2018-05-10 NOTE — Patient Instructions (Signed)
Nice to see you. Please get the labs done at your convenience in the next couple of weeks. I would like for you to follow-up with GI given your fatty liver on your prior CT scan.  If you would like for [place] to arrange this for you please let us know.

## 2018-05-17 LAB — HM DIABETES EYE EXAM

## 2018-05-18 ENCOUNTER — Encounter: Payer: Self-pay | Admitting: Family Medicine

## 2018-05-24 ENCOUNTER — Telehealth: Payer: Self-pay | Admitting: Family Medicine

## 2018-05-24 NOTE — Telephone Encounter (Signed)
The patient will need a lipid panel, CMP, and A1c from my perspective.  Please thank them for obtaining these labs if that is what they are doing.  Thanks.

## 2018-05-24 NOTE — Telephone Encounter (Signed)
Please advise 

## 2018-05-24 NOTE — Telephone Encounter (Signed)
Copied from [place] [id number]. Topic: Quick Communication - See Telephone Encounter >> May 24, 2018  3:00 PM Bea Graff, NT wrote: CRM for notification. See Telephone encounter for: 05/24/18. Christy with Dr. Clearance Coots office at [facility] is needing to know what labs the pt is needing drawn per dr. Caryl Bis. Pts appt is 05/31/18. CB#: [id number]. Any oncology nurses can help.

## 2018-05-25 NOTE — Telephone Encounter (Signed)
Orders given.  

## 2018-06-02 ENCOUNTER — Telehealth: Payer: Self-pay | Admitting: Family Medicine

## 2018-06-02 DIAGNOSIS — E875 Hyperkalemia: Secondary | ICD-10-CM

## 2018-06-02 NOTE — Telephone Encounter (Signed)
Labs reviewed from [facility].  His A1c is slightly uncontrolled.  I would suggest we increase his Jardiance dose to 25 mg daily.  His potassium is minimally elevated.  I suggest we recheck that sometime next week.  His cholesterol is very well controlled.  Please see where he wants the potassium to be rechecked.  Thanks.

## 2018-06-05 ENCOUNTER — Other Ambulatory Visit: Payer: Self-pay | Admitting: Family Medicine

## 2018-06-05 ENCOUNTER — Other Ambulatory Visit: Payer: Self-pay | Admitting: Physician Assistant

## 2018-06-05 NOTE — Telephone Encounter (Signed)
fyi

## 2018-06-05 NOTE — Telephone Encounter (Signed)
FYI

## 2018-06-05 NOTE — Telephone Encounter (Signed)
Patient called, left VM to return call back to the office for information from Dr. Caryl Bis.

## 2018-06-05 NOTE — Telephone Encounter (Signed)
Spoke to patient-  At first he voiced concerns about taking away a dose of the BP medication- but he is going to stop the night time dose- he will keep an eye on his BP. He will pick up the increased dose of the Jardiance- go ahead and send it in.  Lab scheduled this week to recheck K+ level.

## 2018-06-05 NOTE — Telephone Encounter (Signed)
Please let the patient know that I reviewed his medications and his recent labs with our clinical pharmacist.  It appears the Jardiance could potentially cause the potassium to go up though his lisinopril could be playing a role as well.  After talking with the clinical pharmacist I would suggest we decrease his lisinopril dose to 5 mg and increase the Jardiance dose.  He really needs to have his potassium rechecked relatively soon preferably this week.  Once you speak with him regarding this I can send in the new doses of his medications.  Thanks.

## 2018-06-05 NOTE — Telephone Encounter (Signed)
Left message to return call, ok for pec to speak to patient about message below and schedule non fasting lab appointment

## 2018-06-05 NOTE — Addendum Note (Signed)
Addended by: Caryl Bis Dawson Albers G on: 06/05/2018 01:13 PM   Modules accepted: Orders

## 2018-06-05 NOTE — Telephone Encounter (Signed)
Patient notified and states he is ok with the increase of jardiance. Patient states he will call back to schedule potassium lab

## 2018-06-06 MED ORDER — EMPAGLIFLOZIN 25 MG PO TABS: 25.0000 mg | ORAL_TABLET | Freq: Every day | ORAL | 2 refills | Status: DC

## 2018-06-06 MED ORDER — LISINOPRIL 10 MG PO TABS: 5.0000 mg | ORAL_TABLET | Freq: Every day | ORAL | 1 refills | Status: DC

## 2018-06-06 NOTE — Telephone Encounter (Signed)
Sent to pharmacy.  Orders placed for labs.  The Jardiance will likely help with his blood pressure as well as his diabetes.  He should monitor his blood pressure and we could plan to recheck his blood pressure in a couple of weeks with a nurse visit or he can contact us with his readings in 2 weeks.  Thanks.

## 2018-06-06 NOTE — Telephone Encounter (Signed)
Patient notified

## 2018-06-06 NOTE — Addendum Note (Signed)
Addended by: Caryl Bis, Shaine Newmark G on: 06/06/2018 11:04 AM   Modules accepted: Orders

## 2018-06-08 ENCOUNTER — Other Ambulatory Visit (INDEPENDENT_AMBULATORY_CARE_PROVIDER_SITE_OTHER): Payer: Managed Care, Other (non HMO)

## 2018-06-08 DIAGNOSIS — E785 Hyperlipidemia, unspecified: Secondary | ICD-10-CM | POA: Diagnosis not present

## 2018-06-08 DIAGNOSIS — E875 Hyperkalemia: Secondary | ICD-10-CM | POA: Diagnosis not present

## 2018-06-08 LAB — LIPID PANEL
CHOLESTEROL: 150 mg/dL (ref 0–200)
HDL: 38.8 mg/dL — ABNORMAL LOW (ref >39.00)
NonHDL: 110.99
TRIGLYCERIDES: 309 mg/dL — AB (ref 0.0–149.0)
Total CHOL/HDL Ratio: 4
VLDL: 61.8 mg/dL — ABNORMAL HIGH (ref 0.0–40.0)

## 2018-06-08 LAB — BASIC METABOLIC PANEL
BUN: 15 mg/dL (ref 6–23)
CO2: 25 mEq/L (ref 19–32)
CREATININE: 0.96 mg/dL (ref 0.40–1.50)
Calcium: 9.2 mg/dL (ref 8.4–10.5)
Chloride: 105 mEq/L (ref 96–112)
GFR: 85.11 mL/min (ref >60.00)
Glucose, Bld: 144 mg/dL — ABNORMAL HIGH (ref 70–99)
Potassium: 4.7 mEq/L (ref 3.5–5.1)
Sodium: 138 mEq/L (ref 135–145)

## 2018-06-08 LAB — LDL CHOLESTEROL, DIRECT: Direct LDL: 66 mg/dL

## 2018-08-06 ENCOUNTER — Other Ambulatory Visit: Payer: Self-pay | Admitting: Family Medicine

## 2018-09-07 ENCOUNTER — Other Ambulatory Visit: Payer: Self-pay | Admitting: Family Medicine

## 2018-10-06 ENCOUNTER — Other Ambulatory Visit: Payer: Self-pay | Admitting: Physician Assistant

## 2018-11-06 ENCOUNTER — Other Ambulatory Visit: Payer: Self-pay | Admitting: Physician Assistant

## 2018-11-13 ENCOUNTER — Other Ambulatory Visit: Payer: Self-pay | Admitting: Family Medicine

## 2018-11-13 ENCOUNTER — Ambulatory Visit: Payer: Managed Care, Other (non HMO) | Admitting: Family Medicine

## 2018-11-13 ENCOUNTER — Encounter: Payer: Self-pay | Admitting: Family Medicine

## 2018-11-13 VITALS — BP 110/80 | HR 70 | Temp 97.7°F | Ht 71.0 in | Wt 220.6 lb

## 2018-11-13 DIAGNOSIS — Z720 Tobacco use: Secondary | ICD-10-CM

## 2018-11-13 DIAGNOSIS — Z8619 Personal history of other infectious and parasitic diseases: Secondary | ICD-10-CM

## 2018-11-13 DIAGNOSIS — E782 Mixed hyperlipidemia: Secondary | ICD-10-CM | POA: Diagnosis not present

## 2018-11-13 DIAGNOSIS — E1159 Type 2 diabetes mellitus with other circulatory complications: Secondary | ICD-10-CM | POA: Diagnosis not present

## 2018-11-13 DIAGNOSIS — I10 Essential (primary) hypertension: Secondary | ICD-10-CM | POA: Diagnosis not present

## 2018-11-13 LAB — BASIC METABOLIC PANEL
BUN: 13 mg/dL (ref 6–23)
CALCIUM: 9.2 mg/dL (ref 8.4–10.5)
CO2: 25 meq/L (ref 19–32)
CREATININE: 0.86 mg/dL (ref 0.40–1.50)
Chloride: 102 mEq/L (ref 96–112)
GFR: 96.49 mL/min (ref >60.00)
GLUCOSE: 158 mg/dL — AB (ref 70–99)
Potassium: 4.1 mEq/L (ref 3.5–5.1)
SODIUM: 138 meq/L (ref 135–145)

## 2018-11-13 LAB — HEMOGLOBIN A1C: Hgb A1c MFr Bld: 8.1 % — ABNORMAL HIGH (ref 4.6–6.5)

## 2018-11-13 MED ORDER — METFORMIN HCL ER 500 MG PO TB24: 1000.0000 mg | ORAL_TABLET | Freq: Every day | ORAL | 3 refills | Status: DC

## 2018-11-13 NOTE — Progress Notes (Signed)
  Tommi Rumps, MD Phone: [phone]  Charles Hickman is a 57 y.o. male who presents today for f/u.  CC: dm, htn, hld, history of hep c  HYPERTENSION Disease Monitoring: Blood pressure range-110-125/upper 60s'-upper 70s Chest pain- no      Dyspnea- no Medications: Compliance- taking lisinopril, metoprolol   Edema- no  DIABETES Disease Monitoring: Blood Sugar ranges-not checking consistently Polyuria/phagia/dipsia- no      Optho- UTD Medications: Compliance- taking jardiance Hypoglycemic symptoms- no  HYPERLIPIDEMIA Disease Monitoring: See symptoms for Hypertension Medications: Compliance- taking crestor Right upper quadrant pain- no  Muscle aches- no  History of hepatitis C: Patient completed treatment at [facility] through GI.  At the time of his last follow-up he had undetectable viral load.  No apparent cirrhosis based on review of their last note.  It appears they wanted him to follow-up with them at that time.     Social History   Tobacco Use  Smoking Status Current Every Day Smoker  . Packs/day: 0.25  Smokeless Tobacco Never Used  Tobacco Comment   Weating a patch.      ROS see history of present illness  Objective  Physical Exam Vitals:   11/13/18 0828  BP: 110/80  Pulse: 70  Temp: 97.7 F (36.5 C)  SpO2: 98%    BP Readings from Last 3 Encounters:  11/13/18 110/80  05/10/18 120/70  03/06/18 106/76   Wt Readings from Last 3 Encounters:  11/13/18 220 lb 9.6 oz (100.1 kg)  05/10/18 222 lb 3.2 oz (100.8 kg)  03/06/18 219 lb 6.4 oz (99.5 kg)    Physical Exam  Constitutional: No distress.  Cardiovascular: Normal rate, regular rhythm and normal heart sounds.  Pulmonary/Chest: Effort normal and breath sounds normal.  Musculoskeletal: He exhibits no edema.  Neurological: He is alert.  Skin: Skin is warm and dry. He is not diaphoretic.     Assessment/Plan: Please see individual problem list.  Essential hypertension Well-controlled.   Continue current regimen.  Check BMP.  History of hepatitis C Discussed reasoning behind following up with GI.  Discussed that they would monitor his liver particularly given his history of fatty liver and hepatitis C.  He declines referral.  He notes that they would just be monitoring him and advised him to monitor his diet and minimize alcohol use.  We will continue to monitor liver function periodically.  He notes he will have his liver function checked through his oncologist next month.  Diabetes (HCC) Check A1c.  Continue Jardiance.  HLD (hyperlipidemia) Continue Crestor.  Tobacco abuse Discussed smoking cessation.  Patient knows he needs to quit.  He quit for 3 weeks previously using patches though he thinks he decreased the dose too soon.  I encouraged him to try patches again.   Health Maintenance: Reports he has had a Tdap within the last several years.  Orders Placed This Encounter  Procedures  . Basic Metabolic Panel (BMET)  . HgB A1c    No orders of the defined types were placed in this encounter.    Tommi Rumps, MD [place]

## 2018-11-13 NOTE — Assessment & Plan Note (Signed)
Discussed smoking cessation.  Patient knows he needs to quit.  He quit for 3 weeks previously using patches though he thinks he decreased the dose too soon.  I encouraged him to try patches again.

## 2018-11-13 NOTE — Assessment & Plan Note (Signed)
Discussed reasoning behind following up with GI.  Discussed that they would monitor his liver particularly given his history of fatty liver and hepatitis C.  He declines referral.  He notes that they would just be monitoring him and advised him to monitor his diet and minimize alcohol use.  We will continue to monitor liver function periodically.  He notes he will have his liver function checked through his oncologist next month.

## 2018-11-13 NOTE — Patient Instructions (Signed)
Nice to see you. We will get lab work today and contact you with the results. 

## 2018-11-13 NOTE — Assessment & Plan Note (Signed)
Well-controlled.  Continue current regimen.  Check BMP. 

## 2018-11-13 NOTE — Assessment & Plan Note (Signed)
Check A1c.  Continue Jardiance. 

## 2018-11-13 NOTE — Assessment & Plan Note (Signed)
Continue Crestor 

## 2018-12-30 ENCOUNTER — Other Ambulatory Visit: Payer: Self-pay | Admitting: Family Medicine

## 2019-02-03 ENCOUNTER — Other Ambulatory Visit: Payer: Self-pay | Admitting: Physician Assistant

## 2019-02-05 ENCOUNTER — Telehealth: Payer: Self-pay

## 2019-02-05 NOTE — Telephone Encounter (Signed)
Attempted to schedule fu   No ans no vm

## 2019-02-05 NOTE — Telephone Encounter (Signed)
-----   Message from Janan Ridge, [place] sent at 02/05/2019  9:15 AM EST ----- Regarding: Appointment Can you please try to contact patient for an appointment.   Patient is due for a 12 month follow up.    Thanks Ladies!

## 2019-02-15 ENCOUNTER — Telehealth: Payer: Self-pay

## 2019-02-15 NOTE — Telephone Encounter (Signed)
Copied from [place] [id number]. Topic: Appointment Scheduling - Scheduling Inquiry for Clinic >> Feb 15, 2019  8:49 AM Scherrie Gerlach wrote: Reason for CRM: Collie Siad with [facility] states they would like pt to have labs done for them at pt's appt tomorrow with Dr Josephina Gip.  Pt has appt 9 am on 2/28. Collie Siad is going to fax the order. They need: 1.  Complete blood count w/ differential  2.  LDH  If this is not ok, she can be reached at [phone] But you should receive the fax shortly.

## 2019-02-16 ENCOUNTER — Ambulatory Visit: Payer: Managed Care, Other (non HMO) | Admitting: Family Medicine

## 2019-02-16 ENCOUNTER — Other Ambulatory Visit: Payer: Self-pay | Admitting: Family Medicine

## 2019-02-16 ENCOUNTER — Encounter: Payer: Self-pay | Admitting: Family Medicine

## 2019-02-16 VITALS — BP 118/60 | HR 68 | Temp 97.8°F | Wt 218.0 lb

## 2019-02-16 DIAGNOSIS — I10 Essential (primary) hypertension: Secondary | ICD-10-CM

## 2019-02-16 DIAGNOSIS — E782 Mixed hyperlipidemia: Secondary | ICD-10-CM

## 2019-02-16 DIAGNOSIS — Z8619 Personal history of other infectious and parasitic diseases: Secondary | ICD-10-CM | POA: Diagnosis not present

## 2019-02-16 DIAGNOSIS — C8330 Diffuse large B-cell lymphoma, unspecified site: Secondary | ICD-10-CM | POA: Diagnosis not present

## 2019-02-16 DIAGNOSIS — J309 Allergic rhinitis, unspecified: Secondary | ICD-10-CM

## 2019-02-16 DIAGNOSIS — I2510 Atherosclerotic heart disease of native coronary artery without angina pectoris: Secondary | ICD-10-CM

## 2019-02-16 DIAGNOSIS — E1159 Type 2 diabetes mellitus with other circulatory complications: Secondary | ICD-10-CM

## 2019-02-16 DIAGNOSIS — Z720 Tobacco use: Secondary | ICD-10-CM

## 2019-02-16 LAB — CBC WITH DIFFERENTIAL/PLATELET
BASOS PCT: 0.4 % (ref 0.0–3.0)
Basophils Absolute: 0 10*3/uL (ref 0.0–0.1)
EOS ABS: 0.1 10*3/uL (ref 0.0–0.7)
Eosinophils Relative: 1.7 % (ref 0.0–5.0)
HEMATOCRIT: 46 % (ref 39.0–52.0)
Hemoglobin: 15.8 g/dL (ref 13.0–17.0)
LYMPHS PCT: 30 % (ref 12.0–46.0)
Lymphs Abs: 2.1 10*3/uL (ref 0.7–4.0)
MCHC: 34.4 g/dL (ref 30.0–36.0)
MCV: 90 fl (ref 78.0–100.0)
Monocytes Absolute: 0.8 10*3/uL (ref 0.1–1.0)
Monocytes Relative: 11 % (ref 3.0–12.0)
NEUTROS ABS: 3.9 10*3/uL (ref 1.4–7.7)
Neutrophils Relative %: 56.9 % (ref 43.0–77.0)
PLATELETS: 170 10*3/uL (ref 150.0–400.0)
RBC: 5.11 Mil/uL (ref 4.22–5.81)
RDW: 13 % (ref 11.5–15.5)
WBC: 6.9 10*3/uL (ref 4.0–10.5)

## 2019-02-16 LAB — LIPID PANEL
CHOL/HDL RATIO: 3
Cholesterol: 111 mg/dL (ref 0–200)
HDL: 36.9 mg/dL — ABNORMAL LOW (ref >39.00)
LDL Cholesterol: 42 mg/dL (ref 0–99)
NONHDL: 73.82
TRIGLYCERIDES: 159 mg/dL — AB (ref 0.0–149.0)
VLDL: 31.8 mg/dL (ref 0.0–40.0)

## 2019-02-16 LAB — HEMOGLOBIN A1C: Hgb A1c MFr Bld: 7.6 % — ABNORMAL HIGH (ref 4.6–6.5)

## 2019-02-16 MED ORDER — EMPAGLIFLOZIN 25 MG PO TABS: 25.0000 mg | ORAL_TABLET | Freq: Every day | ORAL | 2 refills | Status: DC

## 2019-02-16 NOTE — Progress Notes (Signed)
Charles Rumps, MD Phone: [phone]  Charles Hickman is a 58 y.o. male who presents today for follow-up.  CC: Hypertension, lymphoma, postnasal drip, diabetes, history of hepatitis C, tobacco abuse  Hypertension: Typically mid 70s on the bottom.  Taking lisinopril at night.  Also metoprolol.  No chest pain or shortness of breath.  Has chronic mild lower extremity edema if he is on his legs for too long.  Goes down overnight.  No orthopnea.  Lymphoma: He notes no weight loss or night sweats.  He has an appointment in March with hematology.  They have requested lab work through our office.  Diabetes: Typically 130-140.  Taking Jardiance and metformin.  No polyuria or polydipsia.  No hypoglycemia.  Postnasal drip: He notes this has been going on for the last several weeks.  Notes mild congestion though it does not feel like a sinus infection.  No sneezing or rhinorrhea.  History of hepatitis C: Underwent treatment and had follow-up testing that was negative.  He is unsure if he got hepatitis A or B vaccines.  Tobacco abuse: Patient is smoking half a pack a day.  He smoked a little bit more than 1 pack/day at his most.  Has been smoking since age 23.  He is ready to quit and is going to try patches.  He has tried them previously though discontinued the 14 mg patch after 2 weeks and transition to the 7 mg patch too early.  He does not want Chantix.  Discussed lung cancer risk with continued smoking.  We also discussed lung cancer screening with low-dose CT scan.  Discussed the risk of lung cancer with his smoking history.  He deferred CT scan.  Social History   Tobacco Use  Smoking Status Current Every Day Smoker  . Packs/day: 0.25  Smokeless Tobacco Never Used  Tobacco Comment   Weating a patch.      ROS see history of present illness  Objective  Physical Exam Vitals:   02/16/19 0855  BP: 118/60  Pulse: 68  Temp: 97.8 F (36.6 C)  SpO2: 98%    BP Readings from Last 3  Encounters:  02/16/19 118/60  11/13/18 110/80  05/10/18 120/70   Wt Readings from Last 3 Encounters:  02/16/19 218 lb (98.9 kg)  11/13/18 220 lb 9.6 oz (100.1 kg)  05/10/18 222 lb 3.2 oz (100.8 kg)    Physical Exam Constitutional:      General: He is not in acute distress.    Appearance: He is not diaphoretic.  HENT:     Mouth/Throat:     Mouth: Mucous membranes are moist.     Pharynx: Oropharynx is clear.  Cardiovascular:     Rate and Rhythm: Normal rate and regular rhythm.     Heart sounds: Normal heart sounds.  Pulmonary:     Effort: Pulmonary effort is normal.     Breath sounds: Normal breath sounds.  Musculoskeletal:     Right lower leg: No edema.     Left lower leg: No edema.  Skin:    General: Skin is warm and dry.  Neurological:     Mental Status: He is alert.      Assessment/Plan: Please see individual problem list.  Essential hypertension Well-controlled.  Continue current regimen.  History of hepatitis C Successfully treated previously.  There was a recommendation for hepatitis A and B vaccines.  Patient is unsure whether or not he got these.  We will request records from [facility]  Diabetes ([place])  Continue current medication.  Check A1c.  Diffuse large B-cell lymphoma ([place]) Lab work requested by his oncologist has been ordered.  He will continue to follow with them.  Allergic rhinitis I suspect this is the cause of his postnasal drip.  Discussed Flonase or Zyrtec over-the-counter.  Tobacco abuse Discussed smoking cessation.  Patient counseled for 4 minutes on this.  He will trial nicotine patches at 14 mg daily for 6 weeks and then transition to the 7 mg patch.  Discussed lung cancer screening though he declined.  CAD (coronary artery disease), native coronary artery Discussed the need for him to follow-up with cardiology.  He will contact them to schedule follow-up.   Health Maintenance: Patient reports having had a tetanus vaccine sometime in the  last 10 years.  Orders Placed This Encounter  Procedures  . HgB A1c  . CBC w/Diff  . Lactate Dehydrogenase (LDH)  . Lipid panel    Meds ordered this encounter  Medications  . empagliflozin (JARDIANCE) 25 MG TABS tablet    Sig: Take 25 mg by mouth daily.    Dispense:  90 tablet    Refill:  2     Charles Rumps, MD [place]

## 2019-02-16 NOTE — Assessment & Plan Note (Signed)
Discussed the need for him to follow-up with cardiology.  He will contact them to schedule follow-up.

## 2019-02-16 NOTE — Assessment & Plan Note (Signed)
Successfully treated previously.  There was a recommendation for hepatitis A and B vaccines.  Patient is unsure whether or not he got these.  We will request records from [facility].

## 2019-02-16 NOTE — Assessment & Plan Note (Signed)
I suspect this is the cause of his postnasal drip.  Discussed Flonase or Zyrtec over-the-counter.

## 2019-02-16 NOTE — Assessment & Plan Note (Signed)
Well-controlled.  Continue current regimen. 

## 2019-02-16 NOTE — Assessment & Plan Note (Signed)
Lab work requested by his oncologist has been ordered.  He will continue to follow with them.

## 2019-02-16 NOTE — Patient Instructions (Addendum)
Nice to see you. We will get lab work today. Please schedule follow-up with cardiology. Please try the 14 mg nicotine patches for 6 weeks and then increase to the 7 mg patch.  If you need refills please let us know. You would benefit from the hepatitis A and B vaccine.  If you would like to do this please let us know.

## 2019-02-16 NOTE — Assessment & Plan Note (Signed)
Discussed smoking cessation.  Patient counseled for 4 minutes on this.  He will trial nicotine patches at 14 mg daily for 6 weeks and then transition to the 7 mg patch.  Discussed lung cancer screening though he declined.

## 2019-02-16 NOTE — Assessment & Plan Note (Signed)
Continue current medication.  Check A1c. 

## 2019-02-17 LAB — LACTATE DEHYDROGENASE: LDH: 125 U/L (ref 120–250)

## 2019-02-19 ENCOUNTER — Telehealth: Payer: Self-pay | Admitting: Family Medicine

## 2019-02-19 NOTE — Telephone Encounter (Signed)
02/19/2019 3:55pm Pt reviewed labs on mychart and will increase to metformin 1000mg  per day and f/u with Dr. Caryl Bis on how it is working for him.  Copied from [facility] [id number]. Topic: Quick Communication - Lab Results (Clinic Use ONLY) >> Feb 19, 2019  2:40 PM Gordy Councilman, CMA wrote: Called patient to inform them of 02/16/2019  lab results. When patient returns call, triage nurse may disclose results.  Gae Bon, CMA

## 2019-02-27 NOTE — Telephone Encounter (Signed)
lmov to schedule  °

## 2019-05-05 ENCOUNTER — Other Ambulatory Visit: Payer: Self-pay | Admitting: Physician Assistant

## 2019-05-07 NOTE — Telephone Encounter (Signed)
Please review for refill.  

## 2019-05-08 ENCOUNTER — Telehealth: Payer: Self-pay | Admitting: Cardiovascular Disease

## 2019-05-08 NOTE — Telephone Encounter (Signed)
lmov

## 2019-05-08 NOTE — Telephone Encounter (Signed)
-----   Message from Alba Destine, [place] sent at 05/07/2019  8:15 AM EDT ----- Past due for an appointment lov 12/2017.

## 2019-05-16 ENCOUNTER — Ambulatory Visit (INDEPENDENT_AMBULATORY_CARE_PROVIDER_SITE_OTHER): Payer: Managed Care, Other (non HMO) | Admitting: Family Medicine

## 2019-05-16 ENCOUNTER — Other Ambulatory Visit: Payer: Self-pay

## 2019-05-16 ENCOUNTER — Telehealth: Payer: Self-pay | Admitting: Family Medicine

## 2019-05-16 ENCOUNTER — Encounter: Payer: Self-pay | Admitting: Family Medicine

## 2019-05-16 DIAGNOSIS — E782 Mixed hyperlipidemia: Secondary | ICD-10-CM

## 2019-05-16 DIAGNOSIS — I10 Essential (primary) hypertension: Secondary | ICD-10-CM

## 2019-05-16 DIAGNOSIS — Z720 Tobacco use: Secondary | ICD-10-CM | POA: Diagnosis not present

## 2019-05-16 DIAGNOSIS — E1159 Type 2 diabetes mellitus with other circulatory complications: Secondary | ICD-10-CM

## 2019-05-16 NOTE — Progress Notes (Signed)
Virtual Visit via video Note  This visit type was conducted due to national recommendations for restrictions regarding the COVID-19 pandemic (e.g. social distancing).  This format is felt to be most appropriate for this patient at this time.  All issues noted in this document were discussed and addressed.  No physical exam was performed (except for noted visual exam findings with Video Visits).   I connected with Charles Hickman today at  9:00 AM EDT by a video enabled telemedicine application and verified that I am speaking with the correct person using two identifiers. 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 I also discussed with the patient that there may be a patient responsible charge related to this service. The patient expressed understanding and agreed to proceed.   Reason for visit: follow-up  HPI: Hypertension: Typically 120s over 70s-80s.  No chest pain or shortness of breath.  He does note some mild pedal edema only at night after being on his feet for long periods of time.  Goes down overnight.  No orthopnea or PND.  He continues on metoprolol and lisinopril.  Diabetes: He has not been checking his sugars.  He is taking Jardiance.  He is taking metformin though could only tolerate 500 mg daily due to GI upset.  No polyuria or polydipsia.  No hypoglycemia.  Hyperlipidemia: Taking Crestor.  No right upper quadrant pain or myalgias.  He is still smoking though he wants to try to quit.  He notes he has to get his mind right to accomplish this.  He is going to try the patch.  ROS: See pertinent positives and negatives per HPI.  Past Medical History:  Diagnosis Date  . Abnormal EKG 11/15/2017  . Abnormal stress test   .  CAD in native artery    a. 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 No EKG changes concerning for ischemia at peak stress or in recovery. Mod to high risk scan; b. LHC 12/15/17: pLAD 75% w/ FFR 0.74 s/p PCI/DES, mRCA 90%, CTO dRCA w/ L-R colatts, EF 45-50%, inf HK   . Diabetes mellitus ([place])   . HCV (hepatitis C virus)   . HLD (hyperlipidemia)   . Hypertension   . 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 s/p 6 cycles of R-CHOP  . Peripheral neuropathy   . Stroke ([place])   . Systolic dysfunction    a. TTE 8/16: EF > 55%, aortic sclerosis, normal RV systolic function; b. LHC 12/15/17: EF 45-50%, inf HK    Past Surgical History:  Procedure Laterality Date  . APPENDECTOMY    . INTRAVASCULAR PRESSURE WIRE/FFR STUDY N/A 12/15/2017   Procedure: INTRAVASCULAR PRESSURE WIRE/FFR STUDY;  Surgeon: Wellington Hampshire, MD;  Location: [facility];  Service: Cardiovascular;  Laterality: N/A;  . LEFT HEART CATH AND CORONARY ANGIOGRAPHY N/A 12/15/2017   Procedure: LEFT HEART CATH AND CORONARY ANGIOGRAPHY;  Surgeon: Wellington Hampshire, MD;  Location: [facility];  Service: Cardiovascular;  Laterality: N/A;  . TONSILLECTOMY AND ADENOIDECTOMY      Family History  Problem Relation Age of Onset  . Hypertension Mother   .  Hyperlipidemia Mother   . Stroke Mother   . Diabetes Mother   . Kidney disease Father   . Hypertension Father   . Diabetes Father   . Dementia Maternal Grandmother     SOCIAL HX: Smoker   Current Outpatient Medications:  .  aspirin EC 81 MG tablet, Take 81 mg by mouth daily., Disp: , Rfl:  .  clopidogrel (PLAVIX) 75 MG tablet, TAKE 1 TABLET (75 MG TOTAL) BY MOUTH DAILY WITH BREAKFAST., Disp: 30 tablet, Rfl: 0 .  empagliflozin (JARDIANCE) 25 MG TABS tablet, Take 25 mg by mouth daily., Disp: 90 tablet, Rfl: 2 .  lisinopril (PRINIVIL,ZESTRIL)  10 MG tablet, TAKE 1/2 TABLET (5 MG TOTAL) BY MOUTH 2 (TWO) TIMES DAILY., Disp: 90 tablet, Rfl: 1 .  metFORMIN (GLUCOPHAGE-XR) 500 MG 24 hr tablet, TAKE 2 TABLETS BY MOUTH EVERY DAY WITH BREAKFAST, Disp: 180 tablet, Rfl: 1 .  metoprolol tartrate (LOPRESSOR) 25 MG tablet, TAKE 1/2 TABLET (12.5 MG TOTAL) BY MOUTH 2 (TWO) TIMES DAILY., Disp: 90 tablet, Rfl: 3 .  rosuvastatin (CRESTOR) 20 MG tablet, TAKE 1 TABLET BY MOUTH EVERY DAY, Disp: 90 tablet, Rfl: 3 .  nicotine (NICODERM CQ - DOSED IN MG/24 HOURS) 14 mg/24hr patch, Place 1 patch (14 mg total) onto the skin daily. (Patient not taking: Reported on 05/16/2019), Disp: 42 patch, Rfl: 0  EXAM:  VITALS per patient if applicable: None.  GENERAL: alert, oriented, appears well and in no acute distress  HEENT: atraumatic, conjunctiva clear, no obvious abnormalities on inspection of external nose and ears  NECK: normal movements of the head and neck  LUNGS: on inspection no signs of respiratory distress, breathing rate appears normal, no obvious gross SOB, gasping or wheezing  CV: no obvious cyanosis  MS: moves all visible extremities without noticeable abnormality  PSYCH/NEURO: pleasant and cooperative, no obvious depression or anxiety, speech and thought processing grossly intact  ASSESSMENT AND PLAN:  Discussed the following assessment and plan:  Essential hypertension  Type 2 diabetes mellitus with other circulatory complication, without long-term current use of insulin (HCC) - Plan: Hemoglobin A1c, Urine Microalbumin w/creat. ratio, Comp Met (CMET)  Mixed hyperlipidemia  Tobacco abuse  Essential hypertension Adequately controlled.  Continue current regimen.  We will have him come in for lab work.  Diabetes ([place]) Undetermined control.  We will have him come in for an A1c.  He will continue metformin and Jardiance.  HLD (hyperlipidemia) Continue Crestor.  Tobacco abuse Encouraged tobacco cessation.  CMA will contact patient  to get him scheduled for labs in the next month and follow-up in 4 months.  Patient reports having had a tetanus vaccine sometime in the last 5 years.  Social distancing precautions and sick precautions given regarding COVID-19.   I discussed the assessment and treatment plan with the patient. The patient was provided an opportunity to ask questions and all were answered. The patient agreed with the plan and demonstrated an understanding of the instructions.   The patient was advised to call back or seek an in-person evaluation if the symptoms worsen or if the condition fails to improve as anticipated.   Tommi Rumps, MD

## 2019-05-16 NOTE — Assessment & Plan Note (Signed)
Adequately controlled.  Continue current regimen.  We will have him come in for lab work.

## 2019-05-16 NOTE — Assessment & Plan Note (Signed)
Continue Crestor 

## 2019-05-16 NOTE — Telephone Encounter (Signed)
Please contact the patient and get him set up for lab work sometime in the next month and follow-up with me in 4 months.  Thanks.

## 2019-05-16 NOTE — Telephone Encounter (Signed)
Pt has been scheduled for both appts

## 2019-05-16 NOTE — Assessment & Plan Note (Signed)
Encouraged tobacco cessation. 

## 2019-05-16 NOTE — Telephone Encounter (Signed)
Virtual Visit Pre-Appointment Phone Call  "(Name), I am calling you today to discuss your upcoming appointment. We are currently trying to limit exposure to the virus that causes COVID-19 by seeing patients at home rather than in the office."  1. "What is the BEST phone number to call the day of the visit?" - include this in appointment notes  2. Do you have or have access to (through a family member/friend) a smartphone with video capability that we can use for your visit?" a. If yes - list this number in appt notes as cell (if different from BEST phone #) and list the appointment type as a VIDEO visit in appointment notes b. If no - list the appointment type as a PHONE visit in appointment notes  3. Confirm consent - "In the setting of the current Covid19 crisis, you are scheduled for a (phone or video) visit with your provider on (date) at (time).  Just as we do with many in-office visits, in order for you to participate in this visit, we must obtain consent.  If you'd like, I can send this to your mychart (if signed up) or email for you to review.  Otherwise, I can obtain your verbal consent now.  All virtual visits are billed to your insurance company just like a normal visit would be.  By agreeing to a virtual visit, we'd like you to understand that the technology does not allow for your provider to perform an examination, and thus may limit your provider's ability to fully assess your condition. If your provider identifies any concerns that need to be evaluated in person, we will make arrangements to do so.  Finally, though the technology is pretty good, we cannot assure that it will always work on either your or our end, and in the setting of a video visit, we may have to convert it to a phone-only visit.  In either situation, we cannot ensure that we have a secure connection.  Are you willing to proceed?" STAFF: Did the patient verbally acknowledge consent to telehealth visit? Document  YES/NO here: YES  4. Advise patient to be prepared - "Two hours prior to your appointment, go ahead and check your blood pressure, pulse, oxygen saturation, and your weight (if you have the equipment to check those) and write them all down. When your visit starts, your provider will ask you for this information. If you have an Apple Watch or Kardia device, please plan to have heart rate information ready on the day of your appointment. Please have a pen and paper handy nearby the day of the visit as well."  5. Give patient instructions for MyChart download to smartphone OR [email or web address] as below if video visit (depending on what platform provider is using)  6. Inform patient they will receive a phone call 15 minutes prior to their appointment time (may be from unknown caller ID) so they should be prepared to answer    TELEPHONE CALL NOTE  AADIN GAUT has been deemed a candidate for a follow-up tele-health visit to limit community exposure during the Covid-19 pandemic. I spoke with the patient via phone to ensure availability of phone/video source, confirm preferred email & phone number, and discuss instructions and expectations.  I reminded OSMAN CALZADILLA to be prepared with any vital sign and/or heart rhythm information that could potentially be obtained via home monitoring, at the time of his visit. I reminded TOBY BREITHAUPT to expect a phone call prior to  his visit.  Clarisse Gouge 05/16/2019 12:27 PM   INSTRUCTIONS FOR DOWNLOADING THE MYCHART APP TO SMARTPHONE  - The patient must first make sure to have activated MyChart and know their login information - If Apple, go to [employer] and type in MyChart in the search bar and download the app. If Android, ask patient to go to [employer] and type in [place] in the search bar and download the app. The app is free but as with any other app downloads, their phone may require them to verify saved payment information or Apple/Android  password.  - The patient will need to then log into the app with their MyChart username and password, and select [facility] as their healthcare provider to link the account. When it is time for your visit, go to the MyChart app, find appointments, and click Begin Video Visit. Be sure to Select Allow for your device to access the Microphone and Camera for your visit. You will then be connected, and your provider will be with you shortly.  **If they have any issues connecting, or need assistance please contact MyChart service desk [phone])**  **If using a computer, in order to ensure the best quality for their visit they will need to use either of the following Internet Browsers: [employer], or Google Chrome**  IF USING DOXIMITY or DOXY.ME - The patient will receive a link just prior to their visit by text.     FULL LENGTH CONSENT FOR TELE-HEALTH VISIT   I hereby voluntarily request, consent and authorize [facility] and its employed or contracted physicians, physician assistants, nurse practitioners or other licensed health care professionals (the Practitioner), to provide me with telemedicine health care services (the Services") as deemed necessary by the treating Practitioner. I acknowledge and consent to receive the Services by the Practitioner via telemedicine. I understand that the telemedicine visit will involve communicating with the Practitioner through live audiovisual communication technology and the disclosure of certain medical information by electronic transmission. I acknowledge that I have been given the opportunity to request an in-person assessment or other available alternative prior to the telemedicine visit and am voluntarily participating in the telemedicine visit.  I understand that I have the right to withhold or withdraw my consent to the use of telemedicine in the course of my care at any time, without affecting my right to future care or treatment,  and that the Practitioner or I may terminate the telemedicine visit at any time. I understand that I have the right to inspect all information obtained and/or recorded in the course of the telemedicine visit and may receive copies of available information for a reasonable fee.  I understand that some of the potential risks of receiving the Services via telemedicine include:   Delay or interruption in medical evaluation due to technological equipment failure or disruption;  Information transmitted may not be sufficient (e.g. poor resolution of images) to allow for appropriate medical decision making by the Practitioner; and/or   In rare instances, security protocols could fail, causing a breach of personal health information.  Furthermore, I acknowledge that it is my responsibility to provide information about my medical history, conditions and care that is complete and accurate to the best of my ability. I acknowledge that Practitioner's advice, recommendations, and/or decision may be based on factors not within their control, such as incomplete or inaccurate data provided by me or distortions of diagnostic images or specimens that may result from electronic transmissions. I  understand that the practice of medicine is not an exact science and that Practitioner makes no warranties or guarantees regarding treatment outcomes. I acknowledge that I will receive a copy of this consent concurrently upon execution via email to the email address I last provided but may also request a printed copy by calling the office of [facility].    I understand that my insurance will be billed for this visit.   I have read or had this consent read to me.  I understand the contents of this consent, which adequately explains the benefits and risks of the Services being provided via telemedicine.   I have been provided ample opportunity to ask questions regarding this consent and the Services and have had my questions  answered to my satisfaction.  I give my informed consent for the services to be provided through the use of telemedicine in my medical care  By participating in this telemedicine visit I agree to the above.

## 2019-05-16 NOTE — Assessment & Plan Note (Signed)
Undetermined control.  We will have him come in for an A1c.  He will continue metformin and Jardiance.

## 2019-05-25 ENCOUNTER — Telehealth: Payer: Self-pay | Admitting: Cardiovascular Disease

## 2019-05-29 ENCOUNTER — Other Ambulatory Visit: Payer: Self-pay | Admitting: Physician Assistant

## 2019-06-07 ENCOUNTER — Other Ambulatory Visit: Payer: Self-pay

## 2019-06-07 ENCOUNTER — Telehealth (INDEPENDENT_AMBULATORY_CARE_PROVIDER_SITE_OTHER): Payer: Managed Care, Other (non HMO) | Admitting: Cardiovascular Disease

## 2019-06-07 DIAGNOSIS — Z720 Tobacco use: Secondary | ICD-10-CM

## 2019-06-07 DIAGNOSIS — I25118 Atherosclerotic heart disease of native coronary artery with other forms of angina pectoris: Secondary | ICD-10-CM | POA: Diagnosis not present

## 2019-06-07 DIAGNOSIS — I519 Heart disease, unspecified: Secondary | ICD-10-CM

## 2019-06-07 DIAGNOSIS — E1159 Type 2 diabetes mellitus with other circulatory complications: Secondary | ICD-10-CM | POA: Diagnosis not present

## 2019-06-07 DIAGNOSIS — I10 Essential (primary) hypertension: Secondary | ICD-10-CM | POA: Diagnosis not present

## 2019-06-07 DIAGNOSIS — E782 Mixed hyperlipidemia: Secondary | ICD-10-CM

## 2019-06-07 MED ORDER — ROSUVASTATIN CALCIUM 10 MG PO TABS: 10.0000 mg | ORAL_TABLET | Freq: Every day | ORAL | 3 refills | Status: DC

## 2019-06-07 MED ORDER — METOPROLOL SUCCINATE ER 25 MG PO TB24: 25.0000 mg | ORAL_TABLET | Freq: Every day | ORAL | 3 refills | Status: DC

## 2019-06-07 MED ORDER — LISINOPRIL 5 MG PO TABS: 5.0000 mg | ORAL_TABLET | Freq: Every day | ORAL | 3 refills | Status: DC

## 2019-06-07 NOTE — Patient Instructions (Addendum)
Medication Instructions:  Your physician has recommended you make the following change in your medication:  1. STOP Metoprolol Tartrate 2. START Metoprolol Succinate 25 mg once daily 3. Updated Lisinopril to 5 mg once daily in the evening  4. Updated Rosuvastatin to 10 mg one daily    If you need a refill on your cardiac medications before your next appointment, please call your pharmacy.    Lab work: No new labs needed   If you have labs (blood work) drawn today and your tests are completely normal, you will receive your results only by: Marland Kitchen MyChart Message (if you have MyChart) OR . A paper copy in the mail If you have any lab test that is abnormal or we need to change your treatment, we will call you to review the results.   Testing/Procedures: No new testing needed   Follow-Up: At [facility], you and your health needs are our priority.  As part of our continuing mission to provide you with exceptional heart care, we have created designated Provider Care Teams.  These Care Teams include your primary Cardiologist (physician) and Advanced Practice Providers (APPs -  Physician Assistants and Nurse Practitioners) who all work together to provide you with the care you need, when you need it.  . You will need a follow up appointment in 12 months .   Please call our office 2 months in advance to schedule this appointment.    . Providers on your designated Care Team:   . Murray Hodgkins, NP . Christell Faith, PA-C . Marrianne Mood, PA-C  Any Other Special Instructions Will Be Listed Below (If Applicable).  For educational health videos Log in to : [email or web address] Or : [email or web address], password : [email or web address]

## 2019-06-07 NOTE — Progress Notes (Signed)
Virtual Visit via Video Note   This visit type was conducted due to national recommendations for restrictions regarding the COVID-19 Pandemic (e.g. social distancing) in an effort to limit this patient's exposure and mitigate transmission in our community.  Due to his co-morbid illnesses, this patient is at least at moderate risk for complications without adequate follow up.  This format is felt to be most appropriate for this patient at this time.  All issues noted in this document were discussed and addressed.  A limited physical exam was performed with this format.  Please refer to the patient's chart for his consent to telehealth for [facility].   I connected with  Charles Hickman on 06/07/19 by a video enabled telemedicine application and verified that I am speaking with the correct person using two identifiers. I discussed the limitations of evaluation and management by telemedicine. The patient expressed understanding and agreed to proceed.   Evaluation Performed:  Follow-up visit  Date:  06/07/2019   ID:  Charles Hickman, DOB 1961/02/08, MRN [id number]  Patient Location:  [address] [place] [address]   Provider location:   [facility], [place] office  PCP:  Leone Haven, MD  Cardiologist:  Patsy Baltimore   Chief Complaint: Smoking, follow-up coronary disease    History of Present Illness:    Charles Hickman is a 58 y.o. male who presents via audio/video conferencing for a telehealth visit today.   The patient does not symptoms concerning for COVID-19 infection (fever, chills, cough, or new SHORTNESS OF BREATH).   Patient has a past medical history of Smoker, reports that he stopped DM II  HBA1C 8.6  Diffuse large cell lymphoma,  6 rounds R-CHOP 2 years ago  Hypertension CVA, left leg , resolved quickly, BP was elevted in the ER 05/2011 lacunar infarction, carotid atheroscerosis CAD December 2018 catheterization, stent placed LAD, chronically  occluded distal RCA Who presents for follow up of his CAD, stent to LAD  Takes crestor 10 metoprol 12.5 BID lisinopril 5 QHS  Smoking 1/2 PPD, started again  Does not want chantix or vapore   HBA1C 7.6  Past medical history reviewed Previous stress test ordered for chest pain symptoms Showing large perfusion defect inferior wall consistent with previous MI Ischemia mid to apical inferior wall ejection fraction 34% 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  cardiac catheterization December 15, 2017 showing chronically occluded distal RCA with left-to-right collaterals Also had 75% proximal LAD disease FFR 0.74 ejection fraction 45% Had successful drug-eluting stent placed in the proximal LAD  He reports having episode of chest pain back in May 2017 Near syncope, diaphoresis Did not go to the hospital "chilled the rest of day" Symptoms resolved on their own without intervention  Past medical history reviewed Review of previous history shows he had colonoscopy 2016 leading to diagnosis of lymphoma, followed by chemotherapy He had a PET CT x2 showing moderate aortic atherosclerosis No mention of coronary calcifications noted  Echo during chemo  07/2015 Normal left ventricular systolic function, ejection fraction > 55%  Aortic sclerosis  Normal right ventricular systolic function  Neuropathy in legs, made worse by chemotherapy for the patient   Prior CV studies:   The following studies were reviewed today:    Past Medical History:  Diagnosis Date  . Abnormal EKG 11/15/2017  . Abnormal stress test   . CAD in native artery    a. Myoview 12/18: mod sized region  of mild ischemia in the mid to apical inferior wall c/w peri-infarct ischemia, large region of HK of the inf wall, EF 34%, EKG NSR w/ old inf MI. No EKG changes concerning for ischemia at peak stress or in recovery. Mod to high risk scan; b. LHC 12/15/17: pLAD 75% w/ FFR 0.74 s/p PCI/DES, mRCA 90%, CTO dRCA w/ L-R colatts,  EF 45-50%, inf HK   . Diabetes mellitus ([place])   . HCV (hepatitis C virus)   . HLD (hyperlipidemia)   . Hypertension   . Large cell lymphoma ([place])    a. s/p 6 cycles of R-CHOP  . Peripheral neuropathy   . Stroke ([place])   . Systolic dysfunction    a. TTE 8/16: EF > 55%, aortic sclerosis, normal RV systolic function; b. LHC 12/15/17: EF 45-50%, inf HK   Past Surgical History:  Procedure Laterality Date  . APPENDECTOMY    . INTRAVASCULAR PRESSURE WIRE/FFR STUDY N/A 12/15/2017   Procedure: INTRAVASCULAR PRESSURE WIRE/FFR STUDY;  Surgeon: Wellington Hampshire, MD;  Location: [facility];  Service: Cardiovascular;  Laterality: N/A;  . LEFT HEART CATH AND CORONARY ANGIOGRAPHY N/A 12/15/2017   Procedure: LEFT HEART CATH AND CORONARY ANGIOGRAPHY;  Surgeon: Wellington Hampshire, MD;  Location: [facility];  Service: Cardiovascular;  Laterality: N/A;  . TONSILLECTOMY AND ADENOIDECTOMY       No outpatient medications have been marked as taking for the 06/07/19 encounter (Telemedicine) with Minna Merritts, MD.     Allergies:   Patient has no known allergies.   Social History   Tobacco Use  . Smoking status: Current Every Day Smoker    Packs/day: 0.50  . Smokeless tobacco: Never Used  . Tobacco comment: Weating a patch.   Substance Use Topics  . Alcohol use: Yes    Comment: occasional  . Drug use: No     Current Outpatient Medications on File Prior to Visit  Medication Sig Dispense Refill  . aspirin EC 81 MG tablet Take 81 mg by mouth daily.    . clopidogrel (PLAVIX) 75 MG tablet TAKE 1 TABLET (75 MG TOTAL) BY MOUTH DAILY WITH BREAKFAST. 30 tablet 0  . empagliflozin (JARDIANCE) 25 MG TABS tablet Take 25 mg by mouth daily. 90 tablet 2  . lisinopril (PRINIVIL,ZESTRIL) 10 MG tablet TAKE 1/2 TABLET (5 MG TOTAL) BY MOUTH 2 (TWO) TIMES DAILY. 90 tablet 1  . metFORMIN (GLUCOPHAGE-XR) 500 MG 24 hr tablet TAKE 2 TABLETS BY MOUTH EVERY DAY WITH BREAKFAST 180 tablet 1  . metoprolol  tartrate (LOPRESSOR) 25 MG tablet TAKE 1/2 TABLET (12.5 MG TOTAL) BY MOUTH 2 (TWO) TIMES DAILY. 90 tablet 3  . nicotine (NICODERM CQ - DOSED IN MG/24 HOURS) 14 mg/24hr patch Place 1 patch (14 mg total) onto the skin daily. (Patient not taking: Reported on 05/16/2019) 42 patch 0  . rosuvastatin (CRESTOR) 20 MG tablet TAKE 1 TABLET BY MOUTH EVERY DAY 90 tablet 3   No current facility-administered medications on file prior to visit.      Family Hx: The patient's family history includes Dementia in his maternal grandmother; Diabetes in his father and mother; Hyperlipidemia in his mother; Hypertension in his father and mother; Kidney disease in his father; Stroke in his mother.  ROS:   Please see the history of present illness.    Review of Systems  Constitutional: Negative.   Respiratory: Negative.   Cardiovascular: Negative.   Gastrointestinal: Negative.   Musculoskeletal: Negative.   Neurological: Negative.  Psychiatric/Behavioral: Negative.   All other systems reviewed and are negative.     Labs/Other Tests and Data Reviewed:    Recent Labs: 11/13/2018: BUN 13; Creatinine, Ser 0.86; Potassium 4.1; Sodium 138 02/16/2019: Hemoglobin 15.8; Platelets 170.0   Recent Lipid Panel Lab Results  Component Value Date/Time   CHOL 111 02/16/2019 09:39 AM   CHOL 216 (H) 10/05/2017 08:53 AM   TRIG 159.0 (H) 02/16/2019 09:39 AM   HDL 36.90 (L) 02/16/2019 09:39 AM   HDL 37 (L) 10/05/2017 08:53 AM   CHOLHDL 3 02/16/2019 09:39 AM   LDLCALC 42 02/16/2019 09:39 AM   LDLCALC 116 (H) 10/05/2017 08:53 AM   LDLDIRECT 66.0 06/08/2018 11:02 AM    Wt Readings from Last 3 Encounters:  02/16/19 218 lb (98.9 kg)  11/13/18 220 lb 9.6 oz (100.1 kg)  05/10/18 222 lb 3.2 oz (100.8 kg)     Exam:    Vital Signs: Vital signs may also be detailed in the HPI There were no vitals taken for this visit.  Wt Readings from Last 3 Encounters:  02/16/19 218 lb (98.9 kg)  11/13/18 220 lb 9.6 oz (100.1 kg)   05/10/18 222 lb 3.2 oz (100.8 kg)   Temp Readings from Last 3 Encounters:  02/16/19 97.8 F (36.6 C) (Oral)  11/13/18 97.7 F (36.5 C) (Oral)  05/10/18 98.2 F (36.8 C) (Oral)   BP Readings from Last 3 Encounters:  02/16/19 118/60  11/13/18 110/80  05/10/18 120/70   Pulse Readings from Last 3 Encounters:  02/16/19 68  11/13/18 70  05/10/18 64    120/70, pulse 70, respirations 16  Well nourished, well developed male in no acute distress. Constitutional:  oriented to person, place, and time. No distress.  Head: Normocephalic and atraumatic.  Eyes:  no discharge. No scleral icterus.  Neck: Normal range of motion. Neck supple.  Pulmonary/Chest: No audible wheezing, no distress, appears comfortable Musculoskeletal: Normal range of motion.  no  tenderness or deformity.  Neurological:   Coordination normal. Full exam not performed Skin:  No rash Psychiatric:  normal mood and affect. behavior is normal. Thought content normal.    ASSESSMENT & PLAN:    Coronary artery disease of native artery of native heart with stable angina pectoris ([place]) -  Recommend he stay on aspirin Plavix for now Cholesterol at goal Strong recommendation for smoking cessation  Mixed hyperlipidemia - Cholesterol is at goal on the current lipid regimen. No changes to the medications were made.  Type 2 diabetes mellitus with other circulatory complication, without long-term current use of insulin (HCC) -  Hemoglobin A1c improved now in the 7 range down from 8.6 but still little weight ago.  Suggested he watch his diet  Essential hypertension -  Blood pressure stable  Tobacco abuse -  Smoking 1/2 pack/day, recommended smoking cessation     COVID-19 Education: The signs and symptoms of COVID-19 were discussed with the patient and how to seek care for testing (follow up with PCP or arrange E-visit).  The importance of social distancing was discussed today.  Patient Risk:   After full review of  this patients clinical status, I feel that they are at least moderate risk at this time.  Time:   Today, I have spent 15 minutes with the patient with telehealth technology discussing the cardiac and medical problems/diagnoses detailed above   10 min spent reviewing the chart prior to patient visit today   Medication Adjustments/Labs and Tests Ordered: Current medicines are reviewed at  length with the patient today.  Concerns regarding medicines are outlined above.   Tests Ordered: No tests ordered   Medication Changes: No changes made   Disposition: Follow-up in 12 months   Signed, Ida Rogue, MD  06/07/2019 2:43 PM    [place] [id number], [place], [place] [address]

## 2019-06-19 ENCOUNTER — Other Ambulatory Visit: Payer: Self-pay

## 2019-06-19 ENCOUNTER — Other Ambulatory Visit (INDEPENDENT_AMBULATORY_CARE_PROVIDER_SITE_OTHER): Payer: Managed Care, Other (non HMO)

## 2019-06-19 DIAGNOSIS — E1159 Type 2 diabetes mellitus with other circulatory complications: Secondary | ICD-10-CM | POA: Diagnosis not present

## 2019-06-19 LAB — HEMOGLOBIN A1C: Hgb A1c MFr Bld: 7.6 % — ABNORMAL HIGH (ref 4.6–6.5)

## 2019-06-19 LAB — COMPREHENSIVE METABOLIC PANEL
ALT: 18 U/L (ref 0–53)
AST: 15 U/L (ref 0–37)
Albumin: 4.5 g/dL (ref 3.5–5.2)
Alkaline Phosphatase: 64 U/L (ref 39–117)
BUN: 12 mg/dL (ref 6–23)
CO2: 25 mEq/L (ref 19–32)
Calcium: 8.9 mg/dL (ref 8.4–10.5)
Chloride: 100 mEq/L (ref 96–112)
Creatinine, Ser: 0.83 mg/dL (ref 0.40–1.50)
GFR: 94.39 mL/min (ref >60.00)
Glucose, Bld: 127 mg/dL — ABNORMAL HIGH (ref 70–99)
Potassium: 3.9 mEq/L (ref 3.5–5.1)
Sodium: 136 mEq/L (ref 135–145)
Total Bilirubin: 0.8 mg/dL (ref 0.2–1.2)
Total Protein: 6.8 g/dL (ref 6.0–8.3)

## 2019-06-19 LAB — MICROALBUMIN / CREATININE URINE RATIO
Creatinine,U: 116.8 mg/dL
Microalb Creat Ratio: 0.7 mg/g (ref 0.0–30.0)
Microalb, Ur: 0.8 mg/dL (ref 0.0–1.9)

## 2019-06-29 ENCOUNTER — Other Ambulatory Visit: Payer: Self-pay | Admitting: Physician Assistant

## 2019-07-19 ENCOUNTER — Other Ambulatory Visit: Payer: Self-pay | Admitting: Family Medicine

## 2019-07-19 MED ORDER — METFORMIN HCL ER 500 MG PO TB24: 1500.0000 mg | ORAL_TABLET | Freq: Every day | ORAL | 1 refills | Status: DC

## 2019-07-28 ENCOUNTER — Other Ambulatory Visit: Payer: Self-pay | Admitting: Cardiovascular Disease

## 2019-08-01 ENCOUNTER — Other Ambulatory Visit: Payer: Self-pay | Admitting: Family Medicine

## 2019-08-06 NOTE — Telephone Encounter (Signed)
Dr. Rockey Situ change the dosing at the patient's last visit.  Please confirm that the patient is taking Crestor 10 mg and if so he does not need this refill.

## 2019-08-06 NOTE — Telephone Encounter (Signed)
Look like this is filled by dr. Rockey Situ?

## 2019-08-10 NOTE — Telephone Encounter (Signed)
Patient does not need. 

## 2019-08-29 ENCOUNTER — Telehealth: Payer: Self-pay | Admitting: *Deleted

## 2019-08-29 DIAGNOSIS — C8330 Diffuse large B-cell lymphoma, unspecified site: Secondary | ICD-10-CM

## 2019-08-29 NOTE — Telephone Encounter (Signed)
Copied from [place] [id number]. Topic: General - Other >> Aug 29, 2019 11:30 AM Rainey Pines A wrote: Balinda Quails from [facility] would like a callback in regards to if they can send over ordered labs to office for patient to have done here. Sues best contact number is [phone]. 2 labs that are needed are the cbc with differential and a lvh.

## 2019-08-29 NOTE — Telephone Encounter (Signed)
I am ok with [place] completing these orders. Please confirm the it is for an [facility] and not an [facility]. Thanks.

## 2019-08-30 NOTE — Telephone Encounter (Signed)
Patient scheduled for labs for tomorrow.  Nina,cma

## 2019-08-30 NOTE — Telephone Encounter (Signed)
I called to confirm it was a [facility].  And she wants the results faxed to them [facility] cancer  at [phone].  Jeanny Rymer,cma

## 2019-08-30 NOTE — Telephone Encounter (Signed)
Orders placed. Patient can be scheduled for labs.

## 2019-08-31 ENCOUNTER — Other Ambulatory Visit (INDEPENDENT_AMBULATORY_CARE_PROVIDER_SITE_OTHER): Payer: Self-pay

## 2019-08-31 ENCOUNTER — Other Ambulatory Visit: Payer: Self-pay

## 2019-08-31 DIAGNOSIS — Z23 Encounter for immunization: Secondary | ICD-10-CM

## 2019-08-31 DIAGNOSIS — C8330 Diffuse large B-cell lymphoma, unspecified site: Secondary | ICD-10-CM

## 2019-08-31 LAB — CBC WITH DIFFERENTIAL/PLATELET
Basophils Absolute: 0 10*3/uL (ref 0.0–0.1)
Basophils Relative: 0.6 % (ref 0.0–3.0)
Eosinophils Absolute: 0.2 10*3/uL (ref 0.0–0.7)
Eosinophils Relative: 2.3 % (ref 0.0–5.0)
HCT: 45.7 % (ref 39.0–52.0)
Hemoglobin: 15.7 g/dL (ref 13.0–17.0)
Lymphocytes Relative: 31.5 % (ref 12.0–46.0)
Lymphs Abs: 2 10*3/uL (ref 0.7–4.0)
MCHC: 34.5 g/dL (ref 30.0–36.0)
MCV: 88.4 fl (ref 78.0–100.0)
Monocytes Absolute: 0.7 10*3/uL (ref 0.1–1.0)
Monocytes Relative: 11 % (ref 3.0–12.0)
Neutro Abs: 3.5 10*3/uL (ref 1.4–7.7)
Neutrophils Relative %: 54.6 % (ref 43.0–77.0)
Platelets: 203 10*3/uL (ref 150.0–400.0)
RBC: 5.16 Mil/uL (ref 4.22–5.81)
RDW: 13.4 % (ref 11.5–15.5)
WBC: 6.4 10*3/uL (ref 4.0–10.5)

## 2019-09-01 LAB — LACTATE DEHYDROGENASE: LDH: 139 U/L (ref 120–250)

## 2019-09-03 ENCOUNTER — Other Ambulatory Visit: Payer: Self-pay

## 2019-09-03 DIAGNOSIS — Z20822 Contact with and (suspected) exposure to covid-19: Secondary | ICD-10-CM

## 2019-09-04 LAB — NOVEL CORONAVIRUS, NAA: SARS-CoV-2, NAA: NOT DETECTED

## 2019-09-17 ENCOUNTER — Ambulatory Visit: Payer: Self-pay | Admitting: Family Medicine

## 2019-10-10 ENCOUNTER — Encounter: Payer: Self-pay | Admitting: Family Medicine

## 2019-10-10 ENCOUNTER — Ambulatory Visit (INDEPENDENT_AMBULATORY_CARE_PROVIDER_SITE_OTHER): Payer: Managed Care, Other (non HMO) | Admitting: Family Medicine

## 2019-10-10 ENCOUNTER — Other Ambulatory Visit: Payer: Self-pay

## 2019-10-10 DIAGNOSIS — E1159 Type 2 diabetes mellitus with other circulatory complications: Secondary | ICD-10-CM | POA: Diagnosis not present

## 2019-10-10 DIAGNOSIS — I2510 Atherosclerotic heart disease of native coronary artery without angina pectoris: Secondary | ICD-10-CM

## 2019-10-10 DIAGNOSIS — I10 Essential (primary) hypertension: Secondary | ICD-10-CM | POA: Diagnosis not present

## 2019-10-10 MED ORDER — LINAGLIPTIN 5 MG PO TABS: 5.0000 mg | ORAL_TABLET | Freq: Every day | ORAL | 1 refills | Status: DC

## 2019-10-10 NOTE — Progress Notes (Signed)
Virtual Visit via video Note  This visit type was conducted due to national recommendations for restrictions regarding the COVID-19 pandemic (e.g. social distancing).  This format is felt to be most appropriate for this patient at this time.  All issues noted in this document were discussed and addressed.  No physical exam was performed (except for noted visual exam findings with Video Visits).   I connected with Charles Hickman today at 11:30 AM EDT by a video enabled telemedicine application and verified that I am speaking with the correct person using two identifiers. Location patient: home Location provider: work Persons participating in the virtual visit: patient, provider  I discussed the limitations, risks, security and privacy concerns of performing an evaluation and management service by telephone and the availability of in person appointments. I also discussed with the patient that there may be a patient responsible charge related to this service. The patient expressed understanding and agreed to proceed.   Reason for visit: follow-up  HPI: Hypertension: Typically 150-130s/70s-low 53s.  Taking metoprolol and lisinopril.  No chest pain or shortness of breath.  He notes occasional swelling in his feet at night.  This goes down overnight.  No orthopnea or PND.  Diabetes: Has not been checking sugars.  He has tried to increase the Metformin though he gets GI upset with higher doses.  He can tolerate metformin 500 mg once daily.  He is taking Jardiance.  No polyuria or polydipsia.  No hypoglycemia.  He is due to see ophthalmology.  CAD: Patient has had no issues with chest pain or shortness of breath.  He continues on aspirin and Plavix.  He saw cardiology over the summer and they recommended that he continue on aspirin and Plavix at this time.  He has had no bleeding issues.   ROS: See pertinent positives and negatives per HPI.  Past Medical History:  Diagnosis Date  . Abnormal EKG  11/15/2017  . Abnormal stress test   . CAD in native artery    a. 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 No EKG changes concerning for ischemia at peak stress or in recovery. Mod to high risk scan; b. LHC 12/15/17: pLAD 75% w/ FFR 0.74 s/p PCI/DES, mRCA 90%, CTO dRCA w/ L-R colatts, EF 45-50%, inf HK   . Diabetes mellitus ([place])   . HCV (hepatitis C virus)   . HLD (hyperlipidemia)   . Hypertension   . Large cell lymphoma ([place])    a. s/p 6 cycles of R-CHOP  . Peripheral neuropathy   . Stroke ([place])   . Systolic dysfunction    a. TTE 8/16: EF > 55%, aortic sclerosis, normal RV systolic function; b. LHC 12/15/17: EF 45-50%, inf HK    Past Surgical History:  Procedure Laterality Date  . APPENDECTOMY    . INTRAVASCULAR PRESSURE WIRE/FFR STUDY N/A 12/15/2017   Procedure: INTRAVASCULAR PRESSURE WIRE/FFR STUDY;  Surgeon: Wellington Hampshire, MD;  Location: [facility];  Service: Cardiovascular;  Laterality: N/A;  . LEFT HEART CATH AND CORONARY ANGIOGRAPHY N/A 12/15/2017   Procedure: LEFT HEART CATH AND CORONARY ANGIOGRAPHY;  Surgeon: Wellington Hampshire, MD;  Location: [facility];  Service: Cardiovascular;  Laterality: N/A;  . TONSILLECTOMY AND ADENOIDECTOMY      Family History  Problem Relation Age of Onset  . Hypertension Mother   .  Hyperlipidemia Mother   . Stroke Mother   . Diabetes Mother   . Kidney disease Father   . Hypertension Father   . Diabetes Father   . Dementia Maternal Grandmother     SOCIAL HX: Current smoker   Current Outpatient Medications:  .  aspirin EC 81 MG tablet, Take 81 mg by mouth daily., Disp: , Rfl:  .  clopidogrel (PLAVIX) 75 MG tablet, TAKE 1 TABLET (75 MG TOTAL) BY MOUTH DAILY WITH BREAKFAST., Disp: 30 tablet, Rfl: 10 .  empagliflozin (JARDIANCE) 25 MG TABS tablet, Take 25 mg by mouth daily., Disp: 90  tablet, Rfl: 2 .  metFORMIN (GLUCOPHAGE-XR) 500 MG 24 hr tablet, Take 3 tablets (1,500 mg total) by mouth daily with breakfast., Disp: 270 tablet, Rfl: 1 .  metoprolol succinate (TOPROL XL) 25 MG 24 hr tablet, Take 1 tablet (25 mg total) by mouth daily., Disp: 90 tablet, Rfl: 3 .  nicotine (NICODERM CQ - DOSED IN MG/24 HOURS) 14 mg/24hr patch, Place 1 patch (14 mg total) onto the skin daily., Disp: 42 patch, Rfl: 0 .  linagliptin (TRADJENTA) 5 MG TABS tablet, Take 1 tablet (5 mg total) by mouth daily., Disp: 90 tablet, Rfl: 1 .  lisinopril (ZESTRIL) 5 MG tablet, Take 1 tablet (5 mg total) by mouth daily at 6 PM., Disp: 90 tablet, Rfl: 3 .  rosuvastatin (CRESTOR) 10 MG tablet, Take 1 tablet (10 mg total) by mouth daily., Disp: 90 tablet, Rfl: 3  EXAM:  VITALS per patient if applicable: None.  GENERAL: alert, oriented, appears well and in no acute distress  HEENT: atraumatic, conjunttiva clear, no obvious abnormalities on inspection of external nose and ears  NECK: normal movements of the head and neck  LUNGS: on inspection no signs of respiratory distress, breathing rate appears normal, no obvious gross SOB, gasping or wheezing  CV: no obvious cyanosis  MS: moves all visible extremities without noticeable abnormality  PSYCH/NEURO: pleasant and cooperative, no obvious depression or anxiety, speech and thought processing grossly intact  ASSESSMENT AND PLAN:  Discussed the following assessment and plan:  CAD (coronary artery disease), native coronary artery Asymptomatic.  He will continue to follow with cardiology.  Essential hypertension Adequately controlled.  Continue current regimen.  Diabetes ([place]) Uncontrolled on last check.  He is not able to tolerate higher doses of Metformin.  We will add Tradjenta.  He will let us know if this is too expensive.  He will return in 3 months for follow-up and labs.  Continue metformin and Jardiance.    I discussed the assessment and  treatment plan with the patient. The patient was provided an opportunity to ask questions and all were answered. The patient agreed with the plan and demonstrated an understanding of the instructions.   The patient was advised to call back or seek an in-person evaluation if the symptoms worsen or if the condition fails to improve as anticipated.    Tommi Rumps, MD

## 2019-10-10 NOTE — Assessment & Plan Note (Signed)
Asymptomatic.  He will continue to follow with cardiology. 

## 2019-10-10 NOTE — Assessment & Plan Note (Signed)
Uncontrolled on last check.  He is not able to tolerate higher doses of Metformin.  We will add Tradjenta.  He will let us know if this is too expensive.  He will return in 3 months for follow-up and labs.  Continue metformin and Jardiance.

## 2019-10-10 NOTE — Assessment & Plan Note (Signed)
Adequately controlled.  Continue current regimen. 

## 2019-12-06 ENCOUNTER — Other Ambulatory Visit: Payer: Self-pay | Admitting: Family Medicine

## 2020-01-11 ENCOUNTER — Ambulatory Visit (INDEPENDENT_AMBULATORY_CARE_PROVIDER_SITE_OTHER): Payer: Managed Care, Other (non HMO) | Admitting: Family Medicine

## 2020-01-11 ENCOUNTER — Encounter: Payer: Self-pay | Admitting: Family Medicine

## 2020-01-11 ENCOUNTER — Other Ambulatory Visit: Payer: Self-pay

## 2020-01-11 VITALS — Ht 70.0 in | Wt 210.0 lb

## 2020-01-11 DIAGNOSIS — F17219 Nicotine dependence, cigarettes, with unspecified nicotine-induced disorders: Secondary | ICD-10-CM

## 2020-01-11 DIAGNOSIS — I10 Essential (primary) hypertension: Secondary | ICD-10-CM | POA: Diagnosis not present

## 2020-01-11 DIAGNOSIS — E1159 Type 2 diabetes mellitus with other circulatory complications: Secondary | ICD-10-CM | POA: Diagnosis not present

## 2020-01-11 DIAGNOSIS — C8330 Diffuse large B-cell lymphoma, unspecified site: Secondary | ICD-10-CM

## 2020-01-11 DIAGNOSIS — I2510 Atherosclerotic heart disease of native coronary artery without angina pectoris: Secondary | ICD-10-CM | POA: Diagnosis not present

## 2020-01-11 MED ORDER — NITROGLYCERIN 0.4 MG SL SUBL: 0.4000 mg | SUBLINGUAL_TABLET | SUBLINGUAL | 0 refills | Status: DC | PRN

## 2020-01-11 MED ORDER — METFORMIN HCL ER 500 MG PO TB24: 500.0000 mg | ORAL_TABLET | Freq: Every day | ORAL | 1 refills | Status: DC

## 2020-01-11 NOTE — Progress Notes (Addendum)
Virtual Visit via video Note  This visit type was conducted due to national recommendations for restrictions regarding the COVID-19 pandemic (e.g. social distancing).  This format is felt to be most appropriate for this patient at this time.  All issues noted in this document were discussed and addressed.  No physical exam was performed (except for noted visual exam findings with Video Visits).   I connected with Charles Hickman today at  8:00 AM EST by a video enabled telemedicine application and verified that I am speaking with the correct person using two identifiers. Location patient: home Location provider: work Persons participating in the virtual visit: patient, provider  I discussed the limitations, risks, security and privacy concerns of performing an evaluation and management service by telephone and the availability of in person appointments. I also discussed with the patient that there may be a patient responsible charge related to this service. The patient expressed understanding and agreed to proceed.  Reason for visit: Follow-up.  HPI: CAD/hypertension: No chest pain or shortness of breath.  Minimal edema at the end of the day after being on his feet.  No orthopnea or PND.  Taking Crestor, metoprolol, Plavix, and lisinopril.  BP 121/73, pulse 64.  He has never needed his nitroglycerin.  Diabetes: Not checking his sugars recently.  He is taking Jardiance and Metformin.  Has not seen an ophthalmologist in the last year.  No polyuria or polydipsia.  No hypoglycemia.  Nicotine dependence: Patient smokes half a pack of cigarettes per daily.  He is ready to quit though notes he has to get his mind wrapped around it.  He tried multiple times before with the patches though it keeps creeping back.  He tried gums and lozenges as well though does not like the taste.  He wonders what phase he will be in for the Covid vaccine.  He works as a Medical sales representative at [employer].   ROS: See  pertinent positives and negatives per HPI.  Past Medical History:  Diagnosis Date  . Abnormal EKG 11/15/2017  . Abnormal stress test   . CAD in native artery    a. 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 No EKG changes concerning for ischemia at peak stress or in recovery. Mod to high risk scan; b. LHC 12/15/17: pLAD 75% w/ FFR 0.74 s/p PCI/DES, mRCA 90%, CTO dRCA w/ L-R colatts, EF 45-50%, inf HK   . Diabetes mellitus ([place])   . HCV (hepatitis C virus)   . HLD (hyperlipidemia)   . Hypertension   . 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 s/p 6 cycles of R-CHOP  . Peripheral neuropathy   . Stroke ([place])   . Systolic dysfunction    a. TTE 8/16: EF > 55%, aortic sclerosis, normal RV systolic function; b. LHC 12/15/17: EF 45-50%, inf HK    Past Surgical History:  Procedure Laterality Date  . APPENDECTOMY    . INTRAVASCULAR PRESSURE WIRE/FFR STUDY N/A 12/15/2017   Procedure: INTRAVASCULAR PRESSURE WIRE/FFR STUDY;  Surgeon: Wellington Hampshire, MD;  Location: [facility];  Service: Cardiovascular;  Laterality: N/A;  . LEFT HEART CATH AND CORONARY ANGIOGRAPHY N/A 12/15/2017   Procedure: LEFT HEART CATH AND CORONARY ANGIOGRAPHY;  Surgeon: Wellington Hampshire, MD;  Location: [facility];  Service: Cardiovascular;  Laterality: N/A;  .  TONSILLECTOMY AND ADENOIDECTOMY      Family History  Problem Relation Age of Onset  . Hypertension Mother   . Hyperlipidemia Mother   . Stroke Mother   . Diabetes Mother   . Kidney disease Father   . Hypertension Father   . Diabetes Father   . Dementia Maternal Grandmother     SOCIAL HX: Smoker   Current Outpatient Medications:  .  aspirin EC 81 MG tablet, Take 81 mg by mouth daily., Disp: , Rfl:  .  clopidogrel (PLAVIX) 75 MG tablet, TAKE 1 TABLET (75 MG TOTAL) BY MOUTH DAILY WITH BREAKFAST., Disp: 30 tablet,  Rfl: 10 .  JARDIANCE 25 MG TABS tablet, TAKE 1 TABLET BY MOUTH DAILY, Disp: 90 tablet, Rfl: 2 .  metFORMIN (GLUCOPHAGE-XR) 500 MG 24 hr tablet, Take 3 tablets (1,500 mg total) by mouth daily with breakfast., Disp: 270 tablet, Rfl: 1 .  metoprolol succinate (TOPROL XL) 25 MG 24 hr tablet, Take 1 tablet (25 mg total) by mouth daily., Disp: 90 tablet, Rfl: 3 .  nicotine (NICODERM CQ - DOSED IN MG/24 HOURS) 14 mg/24hr patch, Place 1 patch (14 mg total) onto the skin daily., Disp: 42 patch, Rfl: 0 .  lisinopril (ZESTRIL) 5 MG tablet, Take 1 tablet (5 mg total) by mouth daily at 6 PM., Disp: 90 tablet, Rfl: 3 .  rosuvastatin (CRESTOR) 10 MG tablet, Take 1 tablet (10 mg total) by mouth daily., Disp: 90 tablet, Rfl: 3  EXAM:  VITALS per patient if applicable:  GENERAL: alert, oriented, appears well and in no acute distress  HEENT: atraumatic, conjunttiva clear, no obvious abnormalities on inspection of external nose and ears  NECK: normal movements of the head and neck  LUNGS: on inspection no signs of respiratory distress, breathing rate appears normal, no obvious gross SOB, gasping or wheezing  CV: no obvious cyanosis  MS: moves all visible extremities without noticeable abnormality  PSYCH/NEURO: pleasant and cooperative, no obvious depression or anxiety, speech and thought processing grossly intact  ASSESSMENT AND PLAN:  Discussed the following assessment and plan:  CAD (coronary artery disease), native coronary artery Asymptomatic.  Discussed appropriate use of nitroglycerin.  He will continue his current regimen.  Essential hypertension Well-controlled.  Continue current regimen.  Diabetes ([place]) Undetermined control.  We will have him come in for labs.  Continue current regimen.  Nicotine dependence, cigarettes, w unsp disorders Smoking cessation counseling was provided.  Approximately 3 minutes were spent discussing the rationale for tobacco cessation and strategies for doing  so.  Adjuncts, including nicotine patches, nicotine lozenges, varenicline and buproprion were recommended. F/u in 4 months.   Advised that the patient would likely be in phase 4 for the Covid vaccine.  No orders of the defined types were placed in this encounter.   No orders of the defined types were placed in this encounter.    I discussed the assessment and treatment plan with the patient. The patient was provided an opportunity to ask questions and all were answered. The patient agreed with the plan and demonstrated an understanding of the instructions.   The patient was advised to call back or seek an in-person evaluation if the symptoms worsen or if the condition fails to improve as anticipated.    Tommi Rumps, MD

## 2020-01-11 NOTE — Assessment & Plan Note (Signed)
Smoking cessation counseling was provided.  Approximately 3 minutes were spent discussing the rationale for tobacco cessation and strategies for doing so.  Adjuncts, including nicotine patches, nicotine lozenges, varenicline and buproprion were recommended. F/u in 4 months.

## 2020-01-11 NOTE — Assessment & Plan Note (Signed)
Well-controlled.  Continue current regimen. 

## 2020-01-11 NOTE — Assessment & Plan Note (Signed)
Undetermined control.  We will have him come in for labs.  Continue current regimen.

## 2020-01-11 NOTE — Assessment & Plan Note (Signed)
Asymptomatic.  Discussed appropriate use of nitroglycerin.  He will continue his current regimen.

## 2020-01-11 NOTE — Assessment & Plan Note (Signed)
History of lymphoma.  Currently followed by oncology.  Patient needs lab work sometime prior to March.  The patient will have the oncologist fax orders to [place] though I will go ahead and place an order for an LDH and CBC with differential which we have ordered previously.

## 2020-01-20 ENCOUNTER — Other Ambulatory Visit: Payer: Self-pay | Admitting: Family Medicine

## 2020-01-20 DIAGNOSIS — E1159 Type 2 diabetes mellitus with other circulatory complications: Secondary | ICD-10-CM

## 2020-02-11 ENCOUNTER — Telehealth: Payer: Self-pay | Admitting: Family Medicine

## 2020-02-11 NOTE — Telephone Encounter (Signed)
Sulin with [facility] called and states that they need labs performed. Pt has appt set up with [place] for labs on 2/25 and he wants to get all of the blood work done at one time here in our office. Please call Sulin back [phone]

## 2020-02-11 NOTE — Telephone Encounter (Signed)
I called to Sullin at [facility] and no answer no vm will try tomorrow.  Ostin Mathey,cma

## 2020-02-12 NOTE — Telephone Encounter (Signed)
I called and spoke with Sulin @ [facility] and she stated that the patient has a virtual on 02/14/2020 and she wants you to order labs on this patient so he would not have to ride back down for labs, they are wanting a LDH and a complete blood count with Diff. Serenidy Waltz,cma

## 2020-02-12 NOTE — Telephone Encounter (Signed)
These were already ordered at the time of his last visit. He just needs to come in for labs.

## 2020-02-14 ENCOUNTER — Other Ambulatory Visit: Payer: Self-pay

## 2020-02-14 ENCOUNTER — Other Ambulatory Visit (INDEPENDENT_AMBULATORY_CARE_PROVIDER_SITE_OTHER): Payer: Managed Care, Other (non HMO)

## 2020-02-14 DIAGNOSIS — C8330 Diffuse large B-cell lymphoma, unspecified site: Secondary | ICD-10-CM

## 2020-02-14 DIAGNOSIS — E1159 Type 2 diabetes mellitus with other circulatory complications: Secondary | ICD-10-CM

## 2020-02-14 LAB — BASIC METABOLIC PANEL
BUN: 13 mg/dL (ref 6–23)
CO2: 28 mEq/L (ref 19–32)
Calcium: 9.3 mg/dL (ref 8.4–10.5)
Chloride: 102 mEq/L (ref 96–112)
Creatinine, Ser: 0.88 mg/dL (ref 0.40–1.50)
GFR: 88.04 mL/min (ref >60.00)
Glucose, Bld: 160 mg/dL — ABNORMAL HIGH (ref 70–99)
Potassium: 4.2 mEq/L (ref 3.5–5.1)
Sodium: 138 mEq/L (ref 135–145)

## 2020-02-14 LAB — CBC WITH DIFFERENTIAL/PLATELET
Basophils Absolute: 0 10*3/uL (ref 0.0–0.1)
Basophils Relative: 0.6 % (ref 0.0–3.0)
Eosinophils Absolute: 0.1 10*3/uL (ref 0.0–0.7)
Eosinophils Relative: 1.9 % (ref 0.0–5.0)
HCT: 48.6 % (ref 39.0–52.0)
Hemoglobin: 16.4 g/dL (ref 13.0–17.0)
Lymphocytes Relative: 29 % (ref 12.0–46.0)
Lymphs Abs: 2.1 10*3/uL (ref 0.7–4.0)
MCHC: 33.8 g/dL (ref 30.0–36.0)
MCV: 90.7 fl (ref 78.0–100.0)
Monocytes Absolute: 0.8 10*3/uL (ref 0.1–1.0)
Monocytes Relative: 10.3 % (ref 3.0–12.0)
Neutro Abs: 4.3 10*3/uL (ref 1.4–7.7)
Neutrophils Relative %: 58.2 % (ref 43.0–77.0)
Platelets: 179 10*3/uL (ref 150.0–400.0)
RBC: 5.35 Mil/uL (ref 4.22–5.81)
RDW: 12.9 % (ref 11.5–15.5)
WBC: 7.3 10*3/uL (ref 4.0–10.5)

## 2020-02-14 LAB — LACTATE DEHYDROGENASE: LDH: 118 U/L — ABNORMAL LOW (ref 120–250)

## 2020-02-15 LAB — HEMOGLOBIN A1C: Hgb A1c MFr Bld: 7.7 % — ABNORMAL HIGH (ref 4.6–6.5)

## 2020-04-25 ENCOUNTER — Other Ambulatory Visit: Payer: Self-pay | Admitting: Cardiovascular Disease

## 2020-05-13 ENCOUNTER — Ambulatory Visit: Payer: Managed Care, Other (non HMO) | Admitting: Family Medicine

## 2020-06-03 ENCOUNTER — Ambulatory Visit: Payer: Managed Care, Other (non HMO) | Admitting: Family Medicine

## 2020-06-03 ENCOUNTER — Encounter: Payer: Self-pay | Admitting: Family Medicine

## 2020-06-03 ENCOUNTER — Other Ambulatory Visit: Payer: Self-pay

## 2020-06-03 DIAGNOSIS — I2510 Atherosclerotic heart disease of native coronary artery without angina pectoris: Secondary | ICD-10-CM

## 2020-06-03 DIAGNOSIS — F17219 Nicotine dependence, cigarettes, with unspecified nicotine-induced disorders: Secondary | ICD-10-CM | POA: Diagnosis not present

## 2020-06-03 DIAGNOSIS — I10 Essential (primary) hypertension: Secondary | ICD-10-CM

## 2020-06-03 DIAGNOSIS — E1159 Type 2 diabetes mellitus with other circulatory complications: Secondary | ICD-10-CM

## 2020-06-03 LAB — POCT GLYCOSYLATED HEMOGLOBIN (HGB A1C): Hemoglobin A1C: 7.6 % — AB (ref 4.0–5.6)

## 2020-06-03 MED ORDER — LINAGLIPTIN 5 MG PO TABS: 5.0000 mg | ORAL_TABLET | Freq: Every day | ORAL | 1 refills | Status: DC

## 2020-06-03 NOTE — Assessment & Plan Note (Signed)
Patient will continue with nicotine patches.

## 2020-06-03 NOTE — Progress Notes (Signed)
°  Tommi Rumps, MD Phone: [phone]  Charles Hickman is a 59 y.o. male who presents today for f/u.  DIABETES Disease Monitoring: Blood Sugar ranges-rarely checks, has been around 140 Polyuria/phagia/dipsia- occasional increased thirst      Optho- due Medications: Compliance- taking metformin, jardiance Hypoglycemic symptoms- no  HYPERTENSION/CAD Disease Monitoring  Home BP Monitoring 130-140s/70s-low 80s Chest pain- no    Dyspnea- no Medications  Compliance-  Taking crestor, metoprolol. Notes he stopped the lisinopril to see what his BP would do and see if he could come off of a medication.   Edema- minimal in his feet.   Tobacco abuse: using a patch now. In the process of quitting.    Social History   Tobacco Use  Smoking Status Current Every Day Smoker   Packs/day: 0.50  Smokeless Tobacco Never Used  Tobacco Comment   Weating a patch.      ROS see history of present illness  Objective  Physical Exam Vitals:   06/03/20 1032 06/03/20 1053  BP: 140/80 130/70  Pulse: 67   Temp: (!) 97.3 F (36.3 C)   SpO2: 98%     BP Readings from Last 3 Encounters:  06/03/20 130/70  02/16/19 118/60  11/13/18 110/80   Wt Readings from Last 3 Encounters:  06/03/20 208 lb 9.6 oz (94.6 kg)  01/11/20 210 lb (95.3 kg)  10/10/19 210 lb (95.3 kg)    Physical Exam Constitutional:      General: He is not in acute distress.    Appearance: He is not diaphoretic.  Cardiovascular:     Rate and Rhythm: Normal rate and regular rhythm.     Heart sounds: Normal heart sounds.  Pulmonary:     Effort: Pulmonary effort is normal.     Breath sounds: Normal breath sounds.  Skin:    General: Skin is warm and dry.  Neurological:     Mental Status: He is alert.    Diabetic Foot Exam - Simple   Simple Foot Form Diabetic Foot exam was performed with the following findings: Yes 06/03/2020 10:53 AM  Visual Inspection No deformities, no ulcerations, no other skin breakdown bilaterally:  Yes Sensation Testing Intact to touch and monofilament testing bilaterally: Yes Pulse Check Posterior Tibialis and Dorsalis pulse intact bilaterally: Yes Comments       Assessment/Plan: Please see individual problem list.  Essential hypertension Above goal.  Discussed goal of less than 130/80.  He will start back on his lisinopril.  He will continue metoprolol.  CAD (coronary artery disease), native coronary artery Discussed blood pressure goal of less than 130/80 given his CAD history.  Nicotine dependence, cigarettes, w unsp disorders Patient will continue with nicotine patches.  Diabetes ([place]) Still uncontrolled.  We will start [place].  He will continue Metformin and Jardiance.   No orders of the defined types were placed in this encounter.   Meds ordered this encounter  Medications   linagliptin (TRADJENTA) 5 MG TABS tablet    Sig: Take 1 tablet (5 mg total) by mouth daily.    Dispense:  90 tablet    Refill:  1    This visit occurred during the SARS-CoV-2 public health emergency.  Safety protocols were in place, including screening questions prior to the visit, additional usage of staff PPE, and extensive cleaning of exam room while observing appropriate contact time as indicated for disinfecting solutions.    Tommi Rumps, MD [place]

## 2020-06-03 NOTE — Assessment & Plan Note (Signed)
Above goal.  Discussed goal of less than 130/80.  He will start back on his lisinopril.  He will continue metoprolol.

## 2020-06-03 NOTE — Patient Instructions (Signed)
Nice to see you. We will start you on Tradjenta for your diabetes. Please restart your lisinopril for your blood pressure.

## 2020-06-03 NOTE — Assessment & Plan Note (Signed)
Discussed blood pressure goal of less than 130/80 given his CAD history.

## 2020-06-03 NOTE — Addendum Note (Signed)
Addended by: Fulton Mole D on: 06/03/2020 11:05 AM   Modules accepted: Orders

## 2020-06-03 NOTE — Assessment & Plan Note (Signed)
Still uncontrolled.  We will start [place].  He will continue Metformin and Jardiance.

## 2020-06-15 ENCOUNTER — Other Ambulatory Visit: Payer: Self-pay | Admitting: Cardiovascular Disease

## 2020-06-16 NOTE — Telephone Encounter (Signed)
Please schedule follow-up appointment with Dr. Rockey Situ. Thank you!

## 2020-06-16 NOTE — Telephone Encounter (Signed)
Patient declined at this time has a lot going on and will call back when ready closing this encounter as patient states he has declined x 2.

## 2020-06-19 ENCOUNTER — Other Ambulatory Visit: Payer: Self-pay | Admitting: Cardiovascular Disease

## 2020-06-19 NOTE — Telephone Encounter (Signed)
Medication Refill  Raelene Bott, Brandy L routed conversation to You 3 days ago  You routed conversation to [employer], [place] L 3 days ago  You 3 days ago  [facility]   Patient declined at this time has a lot going on and will call back when ready closing this encounter as patient states he has declined x 2.       Documentation   Newcomer McClain, Brandy L  Cv Div [place] Scheduling 3 days ago     Please schedule follow-up appointment with Dr. Rockey Situ. Thank you!      Documentation

## 2020-06-19 NOTE — Telephone Encounter (Signed)
Please schedule overdue 12 month F/U appointment with Dr. Rockey Situ or APP. Thank you!

## 2020-07-03 ENCOUNTER — Other Ambulatory Visit: Payer: Self-pay | Admitting: Cardiovascular Disease

## 2020-07-07 ENCOUNTER — Other Ambulatory Visit: Payer: Self-pay | Admitting: Cardiovascular Disease

## 2020-07-07 MED ORDER — METOPROLOL SUCCINATE ER 25 MG PO TB24: 25.0000 mg | ORAL_TABLET | Freq: Every day | ORAL | 0 refills | Status: DC

## 2020-07-07 NOTE — Telephone Encounter (Signed)
Requested Prescriptions   Signed Prescriptions Disp Refills   metoprolol succinate (TOPROL-XL) 25 MG 24 hr tablet 90 tablet 0    Sig: Take 1 tablet (25 mg total) by mouth daily.    Authorizing Provider: GOLLAN, TIMOTHY J    Ordering User: NEWCOMER MCCLAIN, Orianna Biskup L    

## 2020-07-07 NOTE — Telephone Encounter (Signed)
*  STAT* If patient is at the pharmacy, call can be transferred to refill team.   1. Which medications need to be refilled? (please list name of each medication and dose if known)   Metoprolol 25 mg po q d   2. Which pharmacy/location (including street and city if local pharmacy) is medication to be sent to?  [facility] [address]  3. Do they need a 30 day or 90 day supply? [place]

## 2020-07-10 ENCOUNTER — Other Ambulatory Visit: Payer: Self-pay | Admitting: Cardiovascular Disease

## 2020-07-17 ENCOUNTER — Other Ambulatory Visit: Payer: Self-pay

## 2020-07-17 ENCOUNTER — Encounter: Payer: Self-pay | Admitting: Family

## 2020-07-17 ENCOUNTER — Ambulatory Visit: Payer: Managed Care, Other (non HMO) | Admitting: Family

## 2020-07-17 VITALS — BP 122/80 | HR 60 | Ht 70.0 in | Wt 207.6 lb

## 2020-07-17 DIAGNOSIS — Z720 Tobacco use: Secondary | ICD-10-CM | POA: Diagnosis not present

## 2020-07-17 DIAGNOSIS — I25118 Atherosclerotic heart disease of native coronary artery with other forms of angina pectoris: Secondary | ICD-10-CM

## 2020-07-17 DIAGNOSIS — E785 Hyperlipidemia, unspecified: Secondary | ICD-10-CM

## 2020-07-17 DIAGNOSIS — I10 Essential (primary) hypertension: Secondary | ICD-10-CM | POA: Diagnosis not present

## 2020-07-17 MED ORDER — METOPROLOL SUCCINATE ER 25 MG PO TB24: 25.0000 mg | ORAL_TABLET | Freq: Every day | ORAL | 3 refills | Status: DC

## 2020-07-17 MED ORDER — LISINOPRIL 5 MG PO TABS: 5.0000 mg | ORAL_TABLET | Freq: Every day | ORAL | 3 refills | Status: DC

## 2020-07-17 MED ORDER — CLOPIDOGREL BISULFATE 75 MG PO TABS
75.0000 mg | ORAL_TABLET | Freq: Every day | ORAL | 3 refills | Status: DC
Start: 2020-07-17 — End: 2021-01-02

## 2020-07-17 MED ORDER — ROSUVASTATIN CALCIUM 10 MG PO TABS: 10.0000 mg | ORAL_TABLET | Freq: Every day | ORAL | 3 refills | Status: DC

## 2020-07-17 NOTE — Progress Notes (Signed)
Office Visit    Patient Name: Charles Hickman Date of Encounter: 07/17/2020  Primary Care Provider:  Leone Haven, MD Primary Cardiologist:  Ida Rogue, MD Electrophysiologist:  None   Chief Complaint    Charles Hickman is a 59 y.o. 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 Abnormal EKG 11/15/2017  . Abnormal stress test   . CAD in native artery    a. 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 No EKG changes concerning for ischemia at peak stress or in recovery. Mod to high risk scan; b. LHC 12/15/17: pLAD 75% w/ FFR 0.74 s/p PCI/DES, mRCA 90%, CTO dRCA w/ L-R colatts, EF 45-50%, inf HK   . Diabetes mellitus ([place])   . HCV (hepatitis C virus)   . HLD (hyperlipidemia)   . Hypertension   . 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 s/p 6 cycles of R-CHOP  . Peripheral neuropathy   . Stroke ([place])   . Systolic dysfunction    a. TTE 8/16: EF > 55%, aortic sclerosis, normal RV systolic function; b. LHC 12/15/17: EF 45-50%, inf HK   Past Surgical History:  Procedure Laterality Date  . APPENDECTOMY    . INTRAVASCULAR PRESSURE WIRE/FFR STUDY N/A 12/15/2017   Procedure: INTRAVASCULAR PRESSURE WIRE/FFR STUDY;  Surgeon: Wellington Hampshire, MD;  Location: [facility];  Service: Cardiovascular;  Laterality: N/A;  . LEFT HEART CATH AND CORONARY ANGIOGRAPHY N/A 12/15/2017   Procedure: LEFT HEART CATH AND CORONARY ANGIOGRAPHY;  Surgeon: Wellington Hampshire, MD;  Location: [facility];  Service: Cardiovascular;  Laterality: N/A;  . TONSILLECTOMY AND ADENOIDECTOMY      Allergies  No Known Allergies  History of Present Illness    Charles Hickman is a 59 y.o. male with a hx of diffuse large cell lymphoma (s/p 6 rounds of R-CHOP), CVA, HTN, CAD (s/p 2018 DES to LAD, CTO distal RCA), DM2, HLD, tobacco use.  He was last seen by Dr. Rockey Situ via telemedicine 06/07/2019.  Previous TTE 07/2015 during chemotherapy with EF greater than 50%, aortic sclerosis, normal RV systolic function.  He had a cardiac catheterization December 2018 showing chronically occluded distal RCA with left-to-right collaterals.  Also noted 75% proximal LAD disease with FFR of 0.74 and EF 45-50%.  He underwent successful DES to proximal LAD.  He was seen by his primary care provider successfully 10/21 noted to have stopped his lisinopril to see what his blood pressure a day.  He was asked to resume his blood pressure was elevated.  He was working on quitting smoking using nicotine patches.  His diabetes was still uncontrolled and he was started on Tradjenta in addition to his Metformin and Jardiance.  Tells me he has not yet tried Tradjenta.  He was encouraged to do so.  Reports no chest pain, pressure, tightness.  Reports no shortness of breath at rest nor dyspnea on exertion.  Tells me he works as a very active job.  He does not have a formal exercise routine.  Endorses some dietary indiscretion and often eats  out for lunch.  EKGs/Labs/Other Studies Reviewed:   The following studies were reviewed today:  EKG:  EKG is ordered today.  The ekg ordered today demonstrates SR 61 bpm with old inferior infarct - no acute ST/T wave changes.   Recent Labs: 02/14/2020: BUN 13; Creatinine, Ser 0.88; Hemoglobin 16.4; Platelets 179.0; Potassium 4.2; Sodium 138  Recent Lipid Panel    Component Value Date/Time   CHOL 111 02/16/2019 0939   CHOL 216 (H) 10/05/2017 0853   TRIG 159.0 (H) 02/16/2019 0939   HDL 36.90 (L) 02/16/2019 0939   HDL 37 (L) 10/05/2017 0853   CHOLHDL 3 02/16/2019 0939   VLDL 31.8 02/16/2019 0939   LDLCALC 42 02/16/2019 0939   LDLCALC 116 (H) 10/05/2017 0853     LDLDIRECT 66.0 06/08/2018 1102    Home Medications   Current Meds  Medication Sig  . aspirin EC 81 MG tablet Take 81 mg by mouth daily.  . clopidogrel (PLAVIX) 75 MG tablet Take 1 tablet (75 mg total) by mouth daily with breakfast.  . JARDIANCE 25 MG TABS tablet TAKE 1 TABLET BY MOUTH DAILY  . linagliptin (TRADJENTA) 5 MG TABS tablet Take 1 tablet (5 mg total) by mouth daily.  Marland Kitchen lisinopril (ZESTRIL) 5 MG tablet Take 1 tablet (5 mg total) by mouth daily at 6 PM.  . metFORMIN (GLUCOPHAGE-XR) 500 MG 24 hr tablet TAKE 3 TABLETS (1,500 MG TOTAL) BY MOUTH DAILY WITH BREAKFAST. (Patient taking differently: Take 500 mg by mouth. )  . metoprolol succinate (TOPROL-XL) 25 MG 24 hr tablet Take 1 tablet (25 mg total) by mouth daily.  . nicotine (NICODERM CQ - DOSED IN MG/24 HOURS) 14 mg/24hr patch Place 1 patch (14 mg total) onto the skin daily.  . nitroGLYCERIN (NITROSTAT) 0.4 MG SL tablet Place 1 tablet (0.4 mg total) under the tongue every 5 (five) minutes as needed for chest pain.  . rosuvastatin (CRESTOR) 10 MG tablet Take 1 tablet (10 mg total) by mouth daily.  . [DISCONTINUED] clopidogrel (PLAVIX) 75 MG tablet Take 1 tablet (75 mg total) by mouth daily with breakfast. Please schedule office visit for further refills. Thank you!  . [DISCONTINUED] lisinopril (ZESTRIL) 5 MG tablet TAKE 1 TABLET (5 MG TOTAL) BY MOUTH DAILY AT 6 PM. NEEDS OFFICE VISIT FOR FURTHER REFILLS.  . [DISCONTINUED] metoprolol succinate (TOPROL-XL) 25 MG 24 hr tablet Take 1 tablet (25 mg total) by mouth daily.  . [DISCONTINUED] rosuvastatin (CRESTOR) 10 MG tablet Take 1 tablet (10 mg total) by mouth daily.      Review of Systems      Review of Systems  Constitutional: Negative for chills, fever and malaise/fatigue.  Cardiovascular: Negative for chest pain, dyspnea on exertion, leg swelling, near-syncope, orthopnea, palpitations and syncope.  Respiratory: Negative for cough, shortness of breath and wheezing.    Gastrointestinal: Negative for nausea and vomiting.  Neurological: Negative for dizziness, light-headedness and weakness.   All other systems reviewed and are otherwise negative except as noted above.  Physical Exam    VS:  BP 122/80 (BP Location: Left Arm, Patient Position: Sitting, Cuff Size: Normal)   Pulse 60   Ht 5\' 10"  (1.778 m)   Wt (!) 207 lb 9.6 oz (94.2 kg)   SpO2 98%   BMI 29.79 kg/m  , BMI Body mass index is 29.79 kg/m. GEN: Well nourished, well developed, in no acute distress. HEENT: normal. Neck: Supple, no JVD, carotid bruits, or masses. Cardiac: RRR, no murmurs, rubs, or gallops. No clubbing,  cyanosis, edema.  Radials/DP/PT 2+ and equal bilaterally.  Respiratory:  Respirations regular and unlabored, clear to auscultation bilaterally. GI: Soft, nontender, nondistended, BS + x 4. MS: No deformity or atrophy. Skin: Warm and dry, no rash. Neuro:  Strength and sensation are intact. Psych: Normal affect.  Assessment & Plan    1. CAD-EKG today with old inferior infarct.  Stable with no anginal symptoms.  No indication for ischemic evaluation.  GDMT includes aspirin, Plavix, Toprol, Crestor. 2. HTN- BP well controlled. Continue current antihypertensive regimen.  3. HLD, LDL goal less than 70 - 01/2019 LDL 42.  Continue Crestor 10 mg daily.  He is due for repeat lipid panel but politely declines today and assures me he will have it done with his primary care provider when he is seen in a couple months. 4. DM2 - 06/03/2020 A1c 7.6.  He has been started on Tradjenta by his primary care provider.  He has not yet started and was encouraged to do so.  We discussed the importance of diabetes control in the setting of coronary disease.  Appreciate inclusion after SGLT2 I for cardioprotective benefit.  Continue to follow with primary care provider. 5. Tobacco use - Plans to use nicotine patches. Smoking cessation encouraged. Recommend utilization of [id number].  Disposition: Follow  up in 1 year(s) with Dr. Arta Bruce or APP  Loel Dubonnet, NP 07/17/2020, 5:00 PM

## 2020-07-17 NOTE — Patient Instructions (Addendum)
Medication Instructions:  No medication changes today. Continue your current medications.   Refills of your cardiac medications were sent to your pharmacy.   Please check with your pharmacy about picking up Tradjenta that was prescribed by Dr. Caryl Bis.  *If you need a refill on your cardiac medications before your next appointment, please call your pharmacy*   Lab Work: Your provider recommends lab work with Dr. Caryl Bis: lipid panel, direct LDL, CMP  If you have labs (blood work) drawn today and your tests are completely normal, you will receive your results only by: Marland Kitchen MyChart Message (if you have MyChart) OR . A paper copy in the mail If you have any lab test that is abnormal or we need to change your treatment, we will call you to review the results.   Testing/Procedures: Your EKG today shows normal sinus rhythm and is similar compared to previous.   Follow-Up: At [facility], you and your health needs are our priority.  As part of our continuing mission to provide you with exceptional heart care, we have created designated Provider Care Teams.  These Care Teams include your primary Cardiologist (physician) and Advanced Practice Providers (APPs -  Physician Assistants and Nurse Practitioners) who all work together to provide you with the care you need, when you need it.  We recommend signing up for the patient portal called "MyChart".  Sign up information is provided on this After Visit Summary.  MyChart is used to connect with patients for Virtual Visits (Telemedicine).  Patients are able to view lab/test results, encounter notes, upcoming appointments, etc.  Non-urgent messages can be sent to your provider as well.   To learn more about what you can do with MyChart, go to [email or web address].    Your next appointment:   1 year(s)  The format for your next appointment:   In Person  Provider:    You may see Ida Rogue, MD or one of the following Advanced  Practice Providers on your designated Care Team:    Murray Hodgkins, NP  Christell Faith, PA-C  Marrianne Mood, PA-C  Laurann Montana, NP  Other Instructions  Coping with Quitting Smoking  Quitting smoking is a physical and mental challenge. You will face cravings, withdrawal symptoms, and temptation. Before quitting, work with your health care provider to make a plan that can help you cope. Preparation can help you quit and keep you from giving in. How can I cope with cravings? Cravings usually last for 5-10 minutes. If you get through it, the craving will pass. Consider taking the following actions to help you cope with cravings:  Keep your mouth busy: ? Chew sugar-free gum. ? Suck on hard candies or a straw. ? Brush your teeth.  Keep your hands and body busy: ? Immediately change to a different activity when you feel a craving. ? Squeeze or play with a ball. ? Do an activity or a hobby, like making bead jewelry, practicing needlepoint, or working with wood. ? Mix up your normal routine. ? Take a short exercise break. Go for a quick walk or run up and down stairs. ? Spend time in public places where smoking is not allowed.  Focus on doing something kind or helpful for someone else.  Call a friend or family member to talk during a craving.  Join a support group.  Call a quit line, such as 1-800-QUIT-NOW.  Talk with your health care provider about medicines that might help you cope with cravings and make quitting easier  for you. How can I deal with withdrawal symptoms? Your body may experience negative effects as it tries to get used to not having nicotine in the system. These effects are called withdrawal symptoms. They may include:  Feeling hungrier than normal.  Trouble concentrating.  Irritability.  Trouble sleeping.  Feeling depressed.  Restlessness and agitation.  Craving a cigarette. To manage withdrawal symptoms:  Avoid places, people, and activities  that trigger your cravings.  Remember why you want to quit.  Get plenty of sleep.  Avoid coffee and other caffeinated drinks. These may worsen some of your symptoms. How can I handle social situations? Social situations can be difficult when you are quitting smoking, especially in the first few weeks. To manage this, you can:  Avoid parties, bars, and other social situations where people might be smoking.  Avoid alcohol.  Leave right away if you have the urge to smoke.  Explain to your family and friends that you are quitting smoking. Ask for understanding and support.  Plan activities with friends or family where smoking is not an option. What are some ways I can cope with stress? Wanting to smoke may cause stress, and stress can make you want to smoke. Find ways to manage your stress. Relaxation techniques can help. For example:  Breathe slowly and deeply, in through your nose and out through your mouth.  Listen to soothing, relaxing music.  Talk with a family member or friend about your stress.  Light a candle.  Soak in a bath or take a shower.  Think about a peaceful place. What are some ways I can prevent weight gain? Be aware that many people gain weight after they quit smoking. However, not everyone does. To keep from gaining weight, have a plan in place before you quit and stick to the plan after you quit. Your plan should include:  Having healthy snacks. When you have a craving, it may help to: ? Eat plain popcorn, crunchy carrots, celery, or other cut vegetables. ? Chew sugar-free gum.  Changing how you eat: ? Eat small portion sizes at meals. ? Eat 4-6 small meals throughout the day instead of 1-2 large meals a day. ? Be mindful when you eat. Do not watch television or do other things that might distract you as you eat.  Exercising regularly: ? Make time to exercise each day. If you do not have time for a long workout, do short bouts of exercise for 5-10  minutes several times a day. ? Do some form of strengthening exercise, like weight lifting, and some form of aerobic exercise, like running or swimming.  Drinking plenty of water or other low-calorie or no-calorie drinks. Drink 6-8 glasses of water daily, or as much as instructed by your health care provider. Summary  Quitting smoking is a physical and mental challenge. You will face cravings, withdrawal symptoms, and temptation to smoke again. Preparation can help you as you go through these challenges.  You can cope with cravings by keeping your mouth busy (such as by chewing gum), keeping your body and hands busy, and making calls to family, friends, or a helpline for people who want to quit smoking.  You can cope with withdrawal symptoms by avoiding places where people smoke, avoiding drinks with caffeine, and getting plenty of rest.  Ask your health care provider about the different ways to prevent weight gain, avoid stress, and handle social situations. This information is not intended to replace advice given to you by your  health care provider. Make sure you discuss any questions you have with your health care provider. Document Revised: 11/18/2017 Document Reviewed: 12/03/2016 Elsevier Patient Education  2020 [employer].

## 2020-07-21 ENCOUNTER — Other Ambulatory Visit: Payer: Self-pay | Admitting: Family Medicine

## 2020-07-21 DIAGNOSIS — E1159 Type 2 diabetes mellitus with other circulatory complications: Secondary | ICD-10-CM

## 2020-09-05 ENCOUNTER — Other Ambulatory Visit: Payer: Self-pay

## 2020-09-05 ENCOUNTER — Encounter: Payer: Self-pay | Admitting: Family Medicine

## 2020-09-05 ENCOUNTER — Telehealth: Payer: Self-pay | Admitting: Family Medicine

## 2020-09-05 ENCOUNTER — Telehealth (INDEPENDENT_AMBULATORY_CARE_PROVIDER_SITE_OTHER): Payer: Managed Care, Other (non HMO) | Admitting: Family Medicine

## 2020-09-05 VITALS — Ht 70.0 in | Wt 207.0 lb

## 2020-09-05 DIAGNOSIS — E1159 Type 2 diabetes mellitus with other circulatory complications: Secondary | ICD-10-CM

## 2020-09-05 DIAGNOSIS — E782 Mixed hyperlipidemia: Secondary | ICD-10-CM

## 2020-09-05 DIAGNOSIS — I10 Essential (primary) hypertension: Secondary | ICD-10-CM

## 2020-09-05 DIAGNOSIS — Z8579 Personal history of other malignant neoplasms of lymphoid, hematopoietic and related tissues: Secondary | ICD-10-CM

## 2020-09-05 MED ORDER — LINAGLIPTIN 5 MG PO TABS: 5.0000 mg | ORAL_TABLET | Freq: Every day | ORAL | 1 refills | Status: DC

## 2020-09-05 NOTE — Assessment & Plan Note (Signed)
Check LDH and CBC with differential.  We will forward these results to his oncologist once they return.

## 2020-09-05 NOTE — Assessment & Plan Note (Signed)
Check lipid panel  

## 2020-09-05 NOTE — Assessment & Plan Note (Signed)
Previously poorly controlled.  He will continue Jardiance 25 mg once daily and Metformin 500 mg once daily.  Use of increased doses of Metformin is limited by GI issues.  We will add Tradjenta and this was sent to his pharmacy.  He will have labs in 3 weeks at his request.

## 2020-09-05 NOTE — Telephone Encounter (Signed)
Patient called and said that his pharmacy needs a prior authorization, also patient is concerned of the interactions with his other medications,and patient has had pancreatitis in the past, linagliptin (TRADJENTA) 5 MG TABS tablet

## 2020-09-05 NOTE — Telephone Encounter (Signed)
patient is concerned of the interactions with his other medications,and patient has had pancreatitis in the past, linagliptin (TRADJENTA) 5 MG TABS tablet` can he take this medication.  Mavin Dyke,cma

## 2020-09-05 NOTE — Assessment & Plan Note (Signed)
At goal per patient report.  He will continue lisinopril 5 mg once daily and metoprolol 25 mg once daily.

## 2020-09-05 NOTE — Progress Notes (Signed)
Virtual Visit via video Note  This visit type was conducted due to national recommendations for restrictions regarding the COVID-19 pandemic (e.g. social distancing).  This format is felt to be most appropriate for this patient at this time.  All issues noted in this document were discussed and addressed.  No physical exam was performed (except for noted visual exam findings with Video Visits).   I connected with Charles Hickman today at  8:00 AM EDT by a video enabled telemedicine application and verified that I am speaking with the correct person using two identifiers. Location patient: home Location provider: work  Persons participating in the virtual visit: patient, provider  I discussed the limitations, risks, security and privacy concerns of performing an evaluation and management service by telephone and the availability of in person appointments. I also discussed with the patient that there may be a patient responsible charge related to this service. The patient expressed understanding and agreed to proceed.  Reason for visit: f/u.  HPI: DIABETES Disease Monitoring: Blood Sugar ranges-not checking consistently  Polyuria/phagia/dipsia- no      Optho- due Medications: Compliance- taking metformin, jardiance, did not get the tradjenta from the pharmacy as they did not call him Hypoglycemic symptoms- no  HYPERTENSION  Disease Monitoring  Home BP Monitoring 125/74 Chest pain- no    Dyspnea- no Medications  Compliance-  Taking lisinopril, metoprolol  Edema-minimal at the end of a long day when he is been on his feet.  Goes down overnight.  History of lymphoma: Patient follows with oncology at [facility]  He has an upcoming appointment and would like his labs drawn here.    ROS: See pertinent positives and negatives per HPI.  Past Medical History:  Diagnosis Date  . Abnormal EKG 11/15/2017  . Abnormal stress test   . CAD in native artery    a. Myoview 12/18: mod sized region of mild  ischemia in the mid to apical inferior wall c/w peri-infarct ischemia, large region of HK of the inf wall, EF 34%, EKG NSR w/ old inf MI. No EKG changes concerning for ischemia at peak stress or in recovery. Mod to high risk scan; b. LHC 12/15/17: pLAD 75% w/ FFR 0.74 s/p PCI/DES, mRCA 90%, CTO dRCA w/ L-R colatts, EF 45-50%, inf HK   . Diabetes mellitus ([place])   . HCV (hepatitis C virus)   . HLD (hyperlipidemia)   . Hypertension   . 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 s/p 6 cycles of R-CHOP  . Peripheral neuropathy   . Stroke ([place])   . Systolic dysfunction    a. TTE 8/16: EF > 55%, aortic sclerosis, normal RV systolic function; b. LHC 12/15/17: EF 45-50%, inf HK    Past Surgical History:  Procedure Laterality Date  . APPENDECTOMY    . INTRAVASCULAR PRESSURE WIRE/FFR STUDY N/A 12/15/2017   Procedure: INTRAVASCULAR PRESSURE WIRE/FFR STUDY;  Surgeon: Wellington Hampshire, MD;  Location: [facility];  Service: Cardiovascular;  Laterality: N/A;  . LEFT HEART CATH AND CORONARY ANGIOGRAPHY N/A 12/15/2017   Procedure: LEFT HEART CATH AND CORONARY ANGIOGRAPHY;  Surgeon: Wellington Hampshire, MD;  Location: [facility];  Service: Cardiovascular;  Laterality: N/A;  . TONSILLECTOMY AND ADENOIDECTOMY      Family History  Problem Relation Age of Onset  . Hypertension Mother   . Hyperlipidemia Mother   . Stroke Mother   . Diabetes Mother   . Kidney disease Father   . Hypertension Father   .  Diabetes Father   . Dementia Maternal Grandmother     SOCIAL HX: Smoker   Current Outpatient Medications:  .  aspirin EC 81 MG tablet, Take 81 mg by mouth daily., Disp: , Rfl:  .  clopidogrel (PLAVIX) 75 MG tablet, Take 1 tablet (75 mg total) by mouth daily with breakfast., Disp: 90 tablet, Rfl: 3 .  JARDIANCE 25 MG TABS tablet, TAKE 1 TABLET BY MOUTH DAILY, Disp: 90 tablet, Rfl: 2 .  linagliptin (TRADJENTA) 5 MG TABS tablet, Take 1 tablet (5 mg total) by mouth daily., Disp: 90 tablet, Rfl: 1 .   lisinopril (ZESTRIL) 5 MG tablet, Take 1 tablet (5 mg total) by mouth daily at 6 PM., Disp: 90 tablet, Rfl: 3 .  metFORMIN (GLUCOPHAGE-XR) 500 MG 24 hr tablet, TAKE 1 TABLET BY MOUTH EVERY DAY WITH BREAKFAST, Disp: 90 tablet, Rfl: 1 .  metoprolol succinate (TOPROL-XL) 25 MG 24 hr tablet, Take 1 tablet (25 mg total) by mouth daily., Disp: 90 tablet, Rfl: 3 .  nicotine (NICODERM CQ - DOSED IN MG/24 HOURS) 14 mg/24hr patch, Place 1 patch (14 mg total) onto the skin daily., Disp: 42 patch, Rfl: 0 .  nitroGLYCERIN (NITROSTAT) 0.4 MG SL tablet, Place 1 tablet (0.4 mg total) under the tongue every 5 (five) minutes as needed for chest pain., Disp: 50 tablet, Rfl: 0 .  rosuvastatin (CRESTOR) 10 MG tablet, Take 1 tablet (10 mg total) by mouth daily., Disp: 90 tablet, Rfl: 3  EXAM:  VITALS per patient if applicable:  GENERAL: alert, oriented, appears well and in no acute distress  HEENT: atraumatic, conjunttiva clear, no obvious abnormalities on inspection of external nose and ears  NECK: normal movements of the head and neck  LUNGS: on inspection no signs of respiratory distress, breathing rate appears normal, no obvious gross SOB, gasping or wheezing  CV: no obvious cyanosis  MS: moves all visible extremities without noticeable abnormality  PSYCH/NEURO: pleasant and cooperative, no obvious depression or anxiety, speech and thought processing grossly intact  ASSESSMENT AND PLAN:  Discussed the following assessment and plan:  Essential hypertension At goal per patient report.  He will continue lisinopril 5 mg once daily and metoprolol 25 mg once daily.  Diabetes ([place]) Previously poorly controlled.  He will continue Jardiance 25 mg once daily and Metformin 500 mg once daily.  Use of increased doses of Metformin is limited by GI issues.  We will add Tradjenta and this was sent to his pharmacy.  He will have labs in 3 weeks at his request.  History of lymphoma Check LDH and CBC with  differential.  We will forward these results to his oncologist once they return.  HLD (hyperlipidemia) Check lipid panel.   Health maintenance: I will have the [facility] contact him to see if he is okay with a referral for colonoscopy.  Orders Placed This Encounter  Procedures  . CBC w/Diff    Standing Status:   Future    Standing Expiration Date:   09/05/2021  . Lactate Dehydrogenase (LDH)    Standing Status:   Future    Standing Expiration Date:   09/05/2021  . HgB A1c    Standing Status:   Future    Standing Expiration Date:   09/05/2021  . Basic Metabolic Panel (BMET)    Standing Status:   Future    Standing Expiration Date:   09/05/2021  . Lipid panel    Standing Status:   Future    Standing Expiration Date:  09/05/2021    Meds ordered this encounter  Medications  . linagliptin (TRADJENTA) 5 MG TABS tablet    Sig: Take 1 tablet (5 mg total) by mouth daily.    Dispense:  90 tablet    Refill:  1     I discussed the assessment and treatment plan with the patient. The patient was provided an opportunity to ask questions and all were answered. The patient agreed with the plan and demonstrated an understanding of the instructions.   The patient was advised to call back or seek an in-person evaluation if the symptoms worsen or if the condition fails to improve as anticipated.  Tommi Rumps, MD

## 2020-09-08 ENCOUNTER — Telehealth: Payer: Self-pay

## 2020-09-08 NOTE — Telephone Encounter (Signed)
Can you find out when he had pancreatitis and what the possible cause was?  Generally Lady Gary is okay to use in those who had a reversible cause of pancreatitis.

## 2020-09-08 NOTE — Telephone Encounter (Signed)
The patient is already on extended release Metformin at the maximum tolerated dose.  The other medicines would not be my first choice in diabetes management at this time.  Is there a way to do a prior authorization?

## 2020-09-09 NOTE — Telephone Encounter (Signed)
LVM for the patient to call me back.  Marisah Laker,cma  

## 2020-09-12 ENCOUNTER — Other Ambulatory Visit: Payer: Self-pay | Admitting: Family Medicine

## 2020-09-15 NOTE — Telephone Encounter (Signed)
I did a PA to see if it will go through.  Jelena Malicoat,cma

## 2020-09-15 NOTE — Telephone Encounter (Signed)
I reached the patient and asked him when he had pancreatitis and he stated he has had it a couple times one time it sent him to the ER and the other time was a year ago.  Patient he also saw that lisinopril is not compatible when taking the Tradjenta as well and he just wants you to look into theses things before he decides on taking it.  Cain Fitzhenry,cma

## 2020-09-25 NOTE — Addendum Note (Signed)
Addended by: Leone Haven on: 09/25/2020 10:36 AM   Modules accepted: Orders

## 2020-09-25 NOTE — Telephone Encounter (Signed)
Sorry for the delay in responding.  He should not take the [place].  I have discontinued this from his medication list.  Please check to see if he is checking his sugars fasting and 2 hours after a meal each day.  If he is not doing that I would like him to do that and write down his sugars so that we can figure out if he is running high all the time or if it is just after eating a specific meal.  That will help dictate what the next step would be.  Thanks.

## 2020-09-25 NOTE — Telephone Encounter (Signed)
I called the patient and informed him to write down his BS fasting and 2 hours after each meal and he stated he would do his best to do that.  The patient needs a BS machine and strips sent to the pharmacy, his machine is old and his strips have expired, so he would like a kit sent to the pharmacy.  Joeanthony Seeling,cma

## 2020-09-26 ENCOUNTER — Other Ambulatory Visit: Payer: Self-pay

## 2020-09-26 ENCOUNTER — Other Ambulatory Visit (INDEPENDENT_AMBULATORY_CARE_PROVIDER_SITE_OTHER): Payer: Managed Care, Other (non HMO)

## 2020-09-26 DIAGNOSIS — Z8579 Personal history of other malignant neoplasms of lymphoid, hematopoietic and related tissues: Secondary | ICD-10-CM | POA: Diagnosis not present

## 2020-09-26 DIAGNOSIS — E782 Mixed hyperlipidemia: Secondary | ICD-10-CM

## 2020-09-26 DIAGNOSIS — E1159 Type 2 diabetes mellitus with other circulatory complications: Secondary | ICD-10-CM

## 2020-09-26 DIAGNOSIS — I10 Essential (primary) hypertension: Secondary | ICD-10-CM | POA: Diagnosis not present

## 2020-09-26 DIAGNOSIS — Z23 Encounter for immunization: Secondary | ICD-10-CM | POA: Diagnosis not present

## 2020-09-26 LAB — CBC WITH DIFFERENTIAL/PLATELET
Basophils Absolute: 0 10*3/uL (ref 0.0–0.1)
Basophils Relative: 0.6 % (ref 0.0–3.0)
Eosinophils Absolute: 0.1 10*3/uL (ref 0.0–0.7)
Eosinophils Relative: 1.6 % (ref 0.0–5.0)
HCT: 48.1 % (ref 39.0–52.0)
Hemoglobin: 16.2 g/dL (ref 13.0–17.0)
Lymphocytes Relative: 32.4 % (ref 12.0–46.0)
Lymphs Abs: 2.3 10*3/uL (ref 0.7–4.0)
MCHC: 33.6 g/dL (ref 30.0–36.0)
MCV: 91.7 fl (ref 78.0–100.0)
Monocytes Absolute: 0.7 10*3/uL (ref 0.1–1.0)
Monocytes Relative: 9.6 % (ref 3.0–12.0)
Neutro Abs: 4 10*3/uL (ref 1.4–7.7)
Neutrophils Relative %: 55.8 % (ref 43.0–77.0)
Platelets: 178 10*3/uL (ref 150.0–400.0)
RBC: 5.24 Mil/uL (ref 4.22–5.81)
RDW: 13.3 % (ref 11.5–15.5)
WBC: 7.2 10*3/uL (ref 4.0–10.5)

## 2020-09-26 LAB — BASIC METABOLIC PANEL
BUN: 12 mg/dL (ref 6–23)
CO2: 28 mEq/L (ref 19–32)
Calcium: 9.2 mg/dL (ref 8.4–10.5)
Chloride: 102 mEq/L (ref 96–112)
Creatinine, Ser: 0.95 mg/dL (ref 0.40–1.50)
GFR: 85.67 mL/min (ref >60.00)
Glucose, Bld: 163 mg/dL — ABNORMAL HIGH (ref 70–99)
Potassium: 4.3 mEq/L (ref 3.5–5.1)
Sodium: 139 mEq/L (ref 135–145)

## 2020-09-26 LAB — LIPID PANEL
Cholesterol: 153 mg/dL (ref 0–200)
HDL: 43.2 mg/dL (ref >39.00)
NonHDL: 109.92
Total CHOL/HDL Ratio: 4
Triglycerides: 297 mg/dL — ABNORMAL HIGH (ref 0.0–149.0)
VLDL: 59.4 mg/dL — ABNORMAL HIGH (ref 0.0–40.0)

## 2020-09-26 LAB — HEMOGLOBIN A1C: Hgb A1c MFr Bld: 8.2 % — ABNORMAL HIGH (ref 4.6–6.5)

## 2020-09-26 LAB — LDL CHOLESTEROL, DIRECT: Direct LDL: 60 mg/dL

## 2020-09-27 LAB — LACTATE DEHYDROGENASE: LDH: 133 U/L (ref 120–250)

## 2020-09-29 MED ORDER — BLOOD GLUCOSE MONITOR KIT: PACK | 0 refills | Status: DC

## 2020-09-29 NOTE — Telephone Encounter (Signed)
Printed.  Please fax.

## 2020-09-29 NOTE — Addendum Note (Signed)
Addended by: Leone Haven on: 09/29/2020 12:38 PM   Modules accepted: Orders

## 2020-09-29 NOTE — Telephone Encounter (Signed)
Faxed to pharmacy confirmation given.  Gal Smolinski,cma

## 2020-10-16 ENCOUNTER — Telehealth: Payer: Self-pay

## 2020-10-16 NOTE — Telephone Encounter (Signed)
-----   Message from Leone Haven, MD sent at 10/07/2020  2:41 PM EDT ----- Please let the patient know his A1c is worse than it was previously.  It is now 8.2.  Please see if he has been checking his sugars fasting and 2 hours after eating.  If he has please see what numbers he has been getting.  His LDL is well controlled.  His triglycerides are minimally elevated.  He needs to watch his diet and exercise for that.  His other lab work is acceptable.  Please fax the CBC and LDH to his hematologist at [facility].

## 2020-11-19 NOTE — Progress Notes (Signed)
Patient does not want a referral for a colonoscopy.  Charles Hickman,cma

## 2020-11-19 NOTE — Progress Notes (Signed)
I called and LVM for the patient to call back to discuss a referral.  Abriel Hattery,cma

## 2020-11-20 ENCOUNTER — Other Ambulatory Visit: Payer: Self-pay | Admitting: Family Medicine

## 2020-12-11 ENCOUNTER — Ambulatory Visit (INDEPENDENT_AMBULATORY_CARE_PROVIDER_SITE_OTHER): Payer: Managed Care, Other (non HMO)

## 2020-12-11 ENCOUNTER — Telehealth: Payer: Self-pay | Admitting: Cardiovascular Disease

## 2020-12-11 ENCOUNTER — Other Ambulatory Visit: Payer: Self-pay

## 2020-12-11 ENCOUNTER — Other Ambulatory Visit: Payer: Managed Care, Other (non HMO)

## 2020-12-11 DIAGNOSIS — R002 Palpitations: Secondary | ICD-10-CM

## 2020-12-11 DIAGNOSIS — I499 Cardiac arrhythmia, unspecified: Secondary | ICD-10-CM

## 2020-12-11 NOTE — Telephone Encounter (Signed)
Patient c/o Palpitations:  High priority if patient c/o lightheadedness, shortness of breath, or chest pain  1) How long have you had palpitations/irregular HR/ Afib? Are you having the symptoms now? 1st episode Saturday night  2) Are you currently experiencing lightheadedness, SOB or CP? Not really maybe a little bit but nothing severe   3) Do you have a history of afib (atrial fibrillation) or irregular heart rhythm? yes  4) Have you checked your BP or HR? (document readings if available): normal except for Saturday night it was a little irregular   5) Are you experiencing any other symptoms? No  Scheduled next available jan 6 berge

## 2020-12-11 NOTE — Telephone Encounter (Signed)
Thank you!!  Charles Hickman S Eoghan Belcher, NP  

## 2020-12-11 NOTE — Telephone Encounter (Signed)
Reached back out to pt with his concerns for being in "A-fib sat night lasting for 4 hrs" per pt. Pt reports he was "having triple beats and ironic beats", did mention 6 shots of bourbon and wife stated they stayed up to 4 am d/t concern for irregular beats. Pt reports felt fine Sunday morning all the way to Tuesday night. Wed morning pt reports he had "skip beats and yawning a lot". Pt reports he could feel the skip beats when checking pulse and stated he pulse ox was showing the same. No CP or shob, but is having some anxiety. Pt did mention that he order a Kardiac 6 lead monitor that should arrive soon. Pt is on Plavix 75 mg daily and ASA 81 mg daily. Advised pt will speak with Laurann Montana, NP about a Zio monitor if this is something he would like to consider, pt is agreeable, wants to come today to have palced if need be and asked for something for his anxiety. Will call pt back with recommendations, he did make an appt for 12/25/2020 with Laurann Montana, NP.   After speaking with Laurann Montana, NP she advised for Zio monitor to be worn for 7 days, okay for pt to come in to have this place today in officer as pt requested, since pt is coming in, verbal order to have CBC, BMET, and TSH drawn in clinic. These orders have been placed. Charles Shark, NP advised pt to avoid ETOH, caffeine, smoking, and to stay well hydrated.   Called pt back, spoke with wife Charles Hickman, pt was in shower, had permission to speak with wife, gave reccomendations to [place] advised by Laurann Montana, NP, both wife and pt are in agreeance, pt reports will come in today around 1pm to have Zio monitor place and labs drawn. Pt placed on schedule. Wife reports there was talk about pt having A-fib or an irregular heart beat 2 years ago around time pt has surgery, no current documentation in pt's hx for A-fib, wife reports pt's father had hx of A-fib. No medication at this time for pt's anxirty was explained wife, verbalized  understanding. Otherwise all questions or concerns were address and no additional concerns at this time. Agreeable to plan, will call back for anything further, will see pt at 1pm today.

## 2020-12-11 NOTE — Telephone Encounter (Signed)
Agree with plan as detailed in previous documentation.   His follow up is scheduled with Ignacia Bayley, NP on 12/26/19. To ensure we have ZIO report during clinic visit may be beneficial to move appointment to approximately 01/02/20 if patient is agreeable.  Loel Dubonnet, NP

## 2020-12-12 LAB — TSH: TSH: 0.674 u[IU]/mL (ref 0.450–4.500)

## 2020-12-12 LAB — BASIC METABOLIC PANEL
BUN/Creatinine Ratio: 14 (ref 10–24)
BUN: 15 mg/dL (ref 8–27)
CO2: 21 mmol/L (ref 20–29)
Calcium: 9.6 mg/dL (ref 8.6–10.2)
Chloride: 104 mmol/L (ref 96–106)
Creatinine, Ser: 1.06 mg/dL (ref 0.76–1.27)
GFR calc Af Amer: 87 mL/min/{1.73_m2} (ref >59)
GFR calc non Af Amer: 75 mL/min/{1.73_m2} (ref >59)
Glucose: 147 mg/dL — ABNORMAL HIGH (ref 65–99)
Potassium: 4.6 mmol/L (ref 3.5–5.2)
Sodium: 140 mmol/L (ref 134–144)

## 2020-12-12 LAB — CBC
Hematocrit: 49.7 % (ref 37.5–51.0)
Hemoglobin: 17.4 g/dL (ref 13.0–17.7)
MCH: 30.8 pg (ref 26.6–33.0)
MCHC: 35 g/dL (ref 31.5–35.7)
MCV: 88 fL (ref 79–97)
Platelets: 200 10*3/uL (ref 150–450)
RBC: 5.65 x10E6/uL (ref 4.14–5.80)
RDW: 12.1 % (ref 11.6–15.4)
WBC: 7.4 10*3/uL (ref 3.4–10.8)

## 2020-12-25 ENCOUNTER — Ambulatory Visit: Payer: Managed Care, Other (non HMO) | Admitting: Nurse Practitioner

## 2020-12-29 ENCOUNTER — Encounter: Payer: Self-pay | Admitting: Family Medicine

## 2020-12-29 ENCOUNTER — Telehealth (INDEPENDENT_AMBULATORY_CARE_PROVIDER_SITE_OTHER): Payer: Managed Care, Other (non HMO) | Admitting: Family Medicine

## 2020-12-29 ENCOUNTER — Other Ambulatory Visit: Payer: Self-pay

## 2020-12-29 DIAGNOSIS — E1159 Type 2 diabetes mellitus with other circulatory complications: Secondary | ICD-10-CM

## 2020-12-29 DIAGNOSIS — I10 Essential (primary) hypertension: Secondary | ICD-10-CM

## 2020-12-29 DIAGNOSIS — R002 Palpitations: Secondary | ICD-10-CM | POA: Diagnosis not present

## 2020-12-29 NOTE — Assessment & Plan Note (Signed)
Uncontrolled.  We will get him in for an A1c.  We will figure out what else we can use for his diabetes after we get that result.  He will continue Jardiance 25 mg daily.

## 2020-12-29 NOTE — Progress Notes (Signed)
Virtual Visit via video Note  This visit type was conducted due to national recommendations for restrictions regarding the COVID-19 pandemic (e.g. social distancing).  This format is felt to be most appropriate for this patient at this time.  All issues noted in this document were discussed and addressed.  No physical exam was performed (except for noted visual exam findings with Video Visits).   I connected with Charles Hickman today at  8:30 AM EST by a video enabled telemedicine application and verified that I am speaking with the correct person using two identifiers. Location patient: work Location provider: work Persons participating in the virtual visit: patient, provider  I discussed the limitations, risks, security and privacy concerns of performing an evaluation and management service by telephone and the availability of in person appointments. I also discussed with the patient that there may be a patient responsible charge related to this service. The patient expressed understanding and agreed to proceed.  Reason for visit: Follow-up.  HPI: Hypertension: Typically 105-130/65-75.  Taking metoprolol and lisinopril.  No chest pain, shortness of breath.  Did report one episode of lightheadedness after he had been working outside in the cold and rain.  He came inside and checked his blood pressure and it was 78/55.  That has not recurred.  Diabetes: Typically 115-150 fasting.  He does note on occasion he will check a 1 hour postprandial and it will be greater than 200.  He takes Jardiance 25 mg daily.  He could not tolerate the Metformin.  No polyuria or polydipsia.  No hypoglycemia.  He has not yet seen an ophthalmologist due to the COVID-19 pandemic.  Palpitations: Patient notes this occurred for about 4 hours on 12/18.  He was feeling quick triple beats.  Notes it felt as though his heart was skipping a beat when he was feeling his pulse.  He had 6 shots of bourbon and had a cup of hot  chocolate right before this started.  He contacted cardiology and they got him set up with a ZIO monitor.  They also check lab work which did not reveal a cause for his symptoms.  He notes the week after this episode he felt his heart skip a beat a couple of times and occasionally felt a tightness up into his throat though since then he has had no recurrence.   ROS: See pertinent positives and negatives per HPI.  Past Medical History:  Diagnosis Date  . Abnormal EKG 11/15/2017  . Abnormal stress test   . CAD in native artery    a. 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 No EKG changes concerning for ischemia at peak stress or in recovery. Mod to high risk scan; b. LHC 12/15/17: pLAD 75% w/ FFR 0.74 s/p PCI/DES, mRCA 90%, CTO dRCA w/ L-R colatts, EF 45-50%, inf HK   . Diabetes mellitus ([place])   . HCV (hepatitis C virus)   . HLD (hyperlipidemia)   . Hypertension   . 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 s/p 6 cycles of R-CHOP  . Peripheral neuropathy   . Stroke ([place])   . Systolic dysfunction    a. TTE 8/16: EF > 55%, aortic sclerosis, normal RV systolic function; b. LHC 12/15/17: EF 45-50%, inf HK    Past Surgical History:  Procedure Laterality Date  . APPENDECTOMY    .  INTRAVASCULAR PRESSURE WIRE/FFR STUDY N/A 12/15/2017   Procedure: INTRAVASCULAR PRESSURE WIRE/FFR STUDY;  Surgeon: Wellington Hampshire, MD;  Location: [facility];  Service: Cardiovascular;  Laterality: N/A;  . LEFT HEART CATH AND CORONARY ANGIOGRAPHY N/A 12/15/2017   Procedure: LEFT HEART CATH AND CORONARY ANGIOGRAPHY;  Surgeon: Wellington Hampshire, MD;  Location: [facility];  Service: Cardiovascular;  Laterality: N/A;  . TONSILLECTOMY AND ADENOIDECTOMY      Family History  Problem Relation Age of Onset  . Hypertension Mother   . Hyperlipidemia Mother   . Stroke Mother    . Diabetes Mother   . Kidney disease Father   . Hypertension Father   . Diabetes Father   . Dementia Maternal Grandmother     SOCIAL HX: Smoker   Current Outpatient Medications:  .  aspirin EC 81 MG tablet, Take 81 mg by mouth daily., Disp: , Rfl:  .  blood glucose meter kit and supplies KIT, Dispense based on patient and insurance preference. Use 2 times daily as directed. (FOR ICD-10 E11.9)., Disp: 1 each, Rfl: 0 .  clopidogrel (PLAVIX) 75 MG tablet, Take 1 tablet (75 mg total) by mouth daily with breakfast., Disp: 90 tablet, Rfl: 3 .  JARDIANCE 25 MG TABS tablet, TAKE 1 TABLET BY MOUTH DAILY, Disp: 90 tablet, Rfl: 2 .  lisinopril (ZESTRIL) 5 MG tablet, Take 1 tablet (5 mg total) by mouth daily at 6 PM., Disp: 90 tablet, Rfl: 3 .  metoprolol succinate (TOPROL-XL) 25 MG 24 hr tablet, Take 1 tablet (25 mg total) by mouth daily., Disp: 90 tablet, Rfl: 3 .  nicotine (NICODERM CQ - DOSED IN MG/24 HOURS) 14 mg/24hr patch, Place 1 patch (14 mg total) onto the skin daily., Disp: 42 patch, Rfl: 0 .  nitroGLYCERIN (NITROSTAT) 0.4 MG SL tablet, Place 1 tablet (0.4 mg total) under the tongue every 5 (five) minutes as needed for chest pain., Disp: 50 tablet, Rfl: 0 .  ONETOUCH VERIO test strip, USE TWICE A DAY AS DIRECTED, Disp: 100 strip, Rfl: 0 .  rosuvastatin (CRESTOR) 10 MG tablet, Take 1 tablet (10 mg total) by mouth daily., Disp: 90 tablet, Rfl: 3  EXAM:  VITALS per patient if applicable:  GENERAL: alert, oriented, appears well and in no acute distress  HEENT: atraumatic, conjunttiva clear, no obvious abnormalities on inspection of external nose and ears  NECK: normal movements of the head and neck  LUNGS: on inspection no signs of respiratory distress, breathing rate appears normal, no obvious gross SOB, gasping or wheezing  CV: no obvious cyanosis  MS: moves all visible extremities without noticeable abnormality  PSYCH/NEURO: pleasant and cooperative, no obvious depression or  anxiety, speech and thought processing grossly intact  ASSESSMENT AND PLAN:  Discussed the following assessment and plan:  Problem List Items Addressed This Visit    Diabetes ([place])    Uncontrolled.  We will get him in for an A1c.  We will figure out what else we can use for his diabetes after we get that result.  He will continue Jardiance 25 mg daily.      Relevant Orders   POCT HgB A1C   Essential hypertension    Adequately controlled.  He did have one episode of low blood pressure.  Discussed monitoring and if he has recurrence of that he should let us know as we may need to alter his regimen.  He will continue lisinopril 5 mg once daily and metoprolol 25 mg daily.  Palpitations    The patient will follow up with cardiology.  His ZIO monitor has not been reviewed by cardiology yet.  Discussed avoiding alcohol and caffeine intake.          I discussed the assessment and treatment plan with the patient. The patient was provided an opportunity to ask questions and all were answered. The patient agreed with the plan and demonstrated an understanding of the instructions.   The patient was advised to call back or seek an in-person evaluation if the symptoms worsen or if the condition fails to improve as anticipated.   Tommi Rumps, MD

## 2020-12-29 NOTE — Assessment & Plan Note (Signed)
Adequately controlled.  He did have one episode of low blood pressure.  Discussed monitoring and if he has recurrence of that he should let us know as we may need to alter his regimen.  He will continue lisinopril 5 mg once daily and metoprolol 25 mg daily.

## 2020-12-29 NOTE — Assessment & Plan Note (Signed)
The patient will follow up with cardiology.  His ZIO monitor has not been reviewed by cardiology yet.  Discussed avoiding alcohol and caffeine intake.

## 2021-01-02 ENCOUNTER — Telehealth: Payer: Self-pay | Admitting: *Deleted

## 2021-01-02 ENCOUNTER — Encounter: Payer: Self-pay | Admitting: Family

## 2021-01-02 ENCOUNTER — Encounter: Payer: Self-pay | Admitting: Nurse Practitioner

## 2021-01-02 DIAGNOSIS — I480 Paroxysmal atrial fibrillation: Secondary | ICD-10-CM

## 2021-01-02 MED ORDER — METOPROLOL SUCCINATE ER 50 MG PO TB24: 50.0000 mg | ORAL_TABLET | Freq: Every day | ORAL | 1 refills | Status: DC

## 2021-01-02 MED ORDER — APIXABAN 5 MG PO TABS: 5.0000 mg | ORAL_TABLET | Freq: Two times a day (BID) | ORAL | 11 refills | Status: DC

## 2021-01-02 NOTE — Telephone Encounter (Signed)
Attempted to call pt with heart monitor results. No answer. Lmtcb.

## 2021-01-02 NOTE — Telephone Encounter (Signed)
Kardia strip reviewed shows atrial fibrillation.   Recommendations as below:  STOP Aspirin/Plavix CHANGE Toprol to 50mg  daily START Eliquis 5mg  BID  Left VM requesting call back. Will additionally send MyChart message with recommendations.   Follow up as scheduled 01/08/2021.  Loel Dubonnet, NP

## 2021-01-02 NOTE — Telephone Encounter (Signed)
-----   Message from Loel Dubonnet, NP sent at 01/01/2021  8:32 AM EST ----- Monitor showed predominantly normal sinus rhythm with average heart rate of 76bpm. Good result! He had rare early beats occurring less than 1 % of the time. These are common and not dangerous. He had 1 episode of a fast heart rate in the top chamber of the heart which was asymptomatic. No significant arrhythmia. Keep follow up as scheduled.

## 2021-01-02 NOTE — Telephone Encounter (Signed)
The patient has been notified of the result and verbalized understanding.  All questions (if any) were answered. Darlyne Russian, RN 01/02/2021 3:41 PM

## 2021-01-05 ENCOUNTER — Telehealth: Payer: Self-pay | Admitting: Cardiovascular Disease

## 2021-01-05 NOTE — Telephone Encounter (Signed)
I called and spoke with the patient. I advised him that our NP, Urban Gibson had tried to reach out to him on Friday regarding his my chart message and made the below recommendations:  STOP Aspirin/Plavix CHANGE Toprol to 50mg  daily START Eliquis 5mg  BID  Left VM requesting call back. Will additionally send MyChart message with recommendations.   Follow up as scheduled 01/08/2021.  The patient states he received these and has just not picked up the eliquis yet as the pharmacy told him this needed a PA.  I have advised him to continue ASA & Plavix until he starts the eliquis.  He states someone else from our office had called him this morning, but it was a really bad connection and he did not know who or what the call was about, but it was not in regards to Caitlin's message.  I advised him the only thing I can maybe think is that it may have been a reminder call for his appointment on 01/08/21.   The patient voices understanding of all of the above and advised he will be in office on Thursday.  I have advised him if he does not pick up his eliquis prior to his office visit, then we can probably provide him with some samples on Thursday.  He is agreeable.

## 2021-01-05 NOTE — Telephone Encounter (Signed)
States he is returning a call made to him. Unsure who contacted him

## 2021-01-08 ENCOUNTER — Ambulatory Visit (INDEPENDENT_AMBULATORY_CARE_PROVIDER_SITE_OTHER): Payer: Managed Care, Other (non HMO) | Admitting: Physician Assistant

## 2021-01-08 ENCOUNTER — Encounter: Payer: Self-pay | Admitting: Physician Assistant

## 2021-01-08 ENCOUNTER — Other Ambulatory Visit: Payer: Self-pay

## 2021-01-08 VITALS — BP 112/70 | HR 68 | Ht 70.0 in | Wt 203.0 lb

## 2021-01-08 DIAGNOSIS — R0683 Snoring: Secondary | ICD-10-CM | POA: Diagnosis not present

## 2021-01-08 DIAGNOSIS — I480 Paroxysmal atrial fibrillation: Secondary | ICD-10-CM | POA: Diagnosis not present

## 2021-01-08 DIAGNOSIS — I25118 Atherosclerotic heart disease of native coronary artery with other forms of angina pectoris: Secondary | ICD-10-CM

## 2021-01-08 DIAGNOSIS — Z720 Tobacco use: Secondary | ICD-10-CM

## 2021-01-08 DIAGNOSIS — R002 Palpitations: Secondary | ICD-10-CM | POA: Diagnosis not present

## 2021-01-08 DIAGNOSIS — I10 Essential (primary) hypertension: Secondary | ICD-10-CM

## 2021-01-08 DIAGNOSIS — I5020 Unspecified systolic (congestive) heart failure: Secondary | ICD-10-CM

## 2021-01-08 DIAGNOSIS — E785 Hyperlipidemia, unspecified: Secondary | ICD-10-CM

## 2021-01-08 DIAGNOSIS — I2510 Atherosclerotic heart disease of native coronary artery without angina pectoris: Secondary | ICD-10-CM

## 2021-01-08 DIAGNOSIS — Z8579 Personal history of other malignant neoplasms of lymphoid, hematopoietic and related tissues: Secondary | ICD-10-CM

## 2021-01-08 NOTE — Patient Instructions (Signed)
Medication Instructions:  Your physician recommends that you continue on your current medications as directed. Please refer to the Current Medication list given to you today.  *If you need a refill on your cardiac medications before your next appointment, please call your pharmacy*   Lab Work: Your physician recommends that you return for lab work (bmp, cbc) in: 1 week  Please have your labs drawn at the [facility]. You do not need an appt. Lab hours are Mon-Fri 7am-6pm.  If you have labs (blood work) drawn today and your tests are completely normal, you will receive your results only by: Marland Kitchen MyChart Message (if you have MyChart) OR . A paper copy in the mail If you have any lab test that is abnormal or we need to change your treatment, we will call you to review the results.   Testing/Procedures: None ordered   Follow-Up: At [facility], you and your health needs are our priority.  As part of our continuing mission to provide you with exceptional heart care, we have created designated Provider Care Teams.  These Care Teams include your primary Cardiologist (physician) and Advanced Practice Providers (APPs -  Physician Assistants and Nurse Practitioners) who all work together to provide you with the care you need, when you need it.  We recommend signing up for the patient portal called "MyChart".  Sign up information is provided on this After Visit Summary.  MyChart is used to connect with patients for Virtual Visits (Telemedicine).  Patients are able to view lab/test results, encounter notes, upcoming appointments, etc.  Non-urgent messages can be sent to your provider as well.   To learn more about what you can do with MyChart, go to [email or web address].    Your next appointment:   3 month(s)  The format for your next appointment:   In Person  Provider:   You may see Ida Rogue, MD or one of the following Advanced Practice Providers on your designated Care Team:     Murray Hodgkins, NP  Christell Faith, PA-C  Marrianne Mood, PA-C  Cadence Steamboat, [place]  Laurann Montana, NP    Other Instructions You have been referred to [facility] for a sleep study consult. Their office will contact you to schedule the appt.  Please consider a referral to see one of our EP Cardiologist

## 2021-01-08 NOTE — Progress Notes (Signed)
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  Past Medical History    Past Medical History:  Diagnosis Date   Abnormal EKG 11/15/2017   Abnormal stress test    Atrial fibrillation (HCC)    CAD in native artery    a. 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 No EKG changes concerning for ischemia at peak stress or in recovery. Mod to high risk scan; b. LHC 12/15/17: pLAD 75% w/ FFR 0.74 s/p PCI/DES, mRCA 90%, CTO dRCA w/ L-R colatts, EF 45-50%, inf HK    Diabetes mellitus (HCC)    HCV (hepatitis C virus)    HLD (hyperlipidemia)    Hypertension    Large cell lymphoma (HCC)    a. s/p 6 cycles of R-CHOP   Peripheral neuropathy    Stroke [facility])    Systolic dysfunction    a. TTE 8/16: EF > 55%, aortic sclerosis, normal RV systolic function; b. LHC 12/15/17: EF 45-50%, inf HK   Past Surgical History:  Procedure Laterality Date   APPENDECTOMY     INTRAVASCULAR PRESSURE WIRE/FFR STUDY N/A 12/15/2017   Procedure: INTRAVASCULAR PRESSURE WIRE/FFR STUDY;  Surgeon: Wellington Hampshire, MD;  Location: [place] [facility];  Service: Cardiovascular;  Laterality: N/A;   LEFT HEART CATH AND CORONARY ANGIOGRAPHY N/A 12/15/2017   Procedure: LEFT HEART CATH AND CORONARY ANGIOGRAPHY;  Surgeon: Wellington Hampshire, MD;  Location: [place] [facility];  Service: Cardiovascular;  Laterality: N/A;   TONSILLECTOMY AND ADENOIDECTOMY      Allergies  No Known Allergies  History of Present Illness    Charles Hickman is a 60 y.o. male with PMH as above.  He is followed in our office by Dr. Rockey Situ, MD.  He was last seen by Charles Montana, NP 07/17/2020.    He reports a family history of atrial fibrillation in his father.  Previous TTE 07/2015 during chemotherapy with EF greater than 50%, aortic sclerosis, normal RVSF.    After an abnormal stress test, he underwent diagnostic LHC 12/15/2017 with significant 2V CAD as detailed below under CV studies.  EF 45 to 50% with inferior wall hypokinesis.  Catheterization showed chronically occluded distal RCA with left-to-right collaterals. 75% proximal LAD disease with FFR 0.74 and EF 45 to 50%.  He underwent successful DES/PCI to proximal LAD.  DAPT was recommended for at least 6 months with aggressive risk factor modification.  The RCA was left to be treated medically.  He was started on nicotine patches.  When seen by his primary care provider 10/09/20, he had self discontinued his lisinopril.  Recommendation was to resume lisinopril given elevated BP.  He was working on smoking cessation with nicotine patches.  DM2 was uncontrolled.  He was started on Tradjenta in addition to his metformin  and Jardiance.    When seen at follow-up 07/17/20 by Charles Montana, NP, he had not yet tried Tradjenta.  Recommendation was to start this medication.  He denied any chest pain, shortness of breath, dyspnea, or signs/symptoms of volume overload.  He did not have a regular workout routine.  He did note dietary indiscretion, often eating out for lunch.  He was continued on GDMT.  BP was noted to be well controlled.  Repeat lipid panel was recommended and deferred for collection with PCP.  Smoking cessation encouraged.  After his visit, he called the office with report of tachypalpitations /irregular rate since  that previous Saturday night 12/18.  On review of telephone note, he had 5-6 shots of bourbon and stayed up until 4 AM that night due to concern for irregular beats.  He usually only drinks a couple of beers each time he drinks, which he noted was infrequent.  He felt fine Sunday 12/19 morning through Tuesday night (12/21).  On 12/10/20, Wednesday morning, he had more skipped beats and felt as if he was yawning more frequently.  He was able to appreciate an irregular rate by feeling his pulse.  He also noted irregular rate by pulse ox.  He reported associated anxiety with this irregular heart rhythm.  He had ordered a Kardia 6-lead monitor, arriving soon.  He denied any severe associated symptoms or CP / SOB.   Recommendations were for 1 week of Zio XT ambulatory cardiac monitoring to assess for atrial fibrillation and determine its burden.  Also recommended was that he avoid alcohol, caffeine, smoking.  He was instructed to stay well-hydrated.   Subsequent ZIO XT ambulatory cardiac monitoring showed 1 episode of SVT and ectopy.  No evidence of atrial fibrillation was noted.  On Wednesday night (12/31/2020), Charles Hickman reported approximately 3 hours of atrial fibrillation.  He recorded his rhythm via Jodelle Red and sent this rhythm strip to Charles Montana, NP via [employer].    Rhythm strip was found consistent with atrial fibrillation.  Recommendation was to stop ASA and Plavix.  Metoprolol succinate was increased to 50 mg daily.  He was started on Eliquis 5 mg twice daily.    Today, 01/08/2021, he returns to clinic and notes he has been doing well since waking the medication changes as outlined above.  He is in NSR on today's visit with HR 68 bpm and BP well controlled.  He does report that he just received Eliquis the day before today's visit (01/07/2021).  Plan was initially for repeat labs today; however, we discussed that since he just received his Eliquis, we will defer collection of these labs.  He  reports he is still smoking but trying to cut back and down to 2 cigarettes/day.  He is trying not to use the patch.  We discussed triggers for smoking and possible ways to combat these known triggers.  Since his last episode of atrial fibrillation on 12/31/2020, he has been trying to avoid alcohol.  He does note some tachypalpitations; however, on further discussion, it is clarified that these tachypalpitations are more consistent with a fluttering and feeling of tightness in his throat that is uncomfortable during his atrial fibrillation episodes.  No chest pain, shortness of breath, presyncope or syncope.  The main symptom of his atrial fibrillation that gives him the most trouble is increased anxiety.  In addition, he has noted that he has been yawning more frequently since his initial episode.  On further discussion, he does report he  snores at night and that his wife has mentioned some concern for sleep apnea.  Sleep study discussed with recommendations as below.  He reports medication compliance.  No signs or symptoms of bleeding on Eliquis, though just started the day before.  Home Medications    Current Outpatient Medications on File Prior to Visit  Medication Sig Dispense Refill   apixaban (ELIQUIS) 5 MG TABS tablet Take 1 tablet (5 mg total) by mouth 2 (two) times daily. 60 tablet 11   blood glucose meter kit and supplies KIT Dispense based on patient and insurance preference. Use 2 times daily as directed. (FOR ICD-10 E11.9). 1 each 0   JARDIANCE 25 MG TABS tablet TAKE 1 TABLET BY MOUTH DAILY 90 tablet 2   lisinopril (ZESTRIL) 5 MG tablet Take 1 tablet (5 mg total) by mouth daily at 6 PM. 90 tablet 3   metoprolol succinate (TOPROL-XL) 50 MG 24 hr tablet Take 1 tablet (50 mg total) by mouth daily. Take with or immediately following a meal. 90 tablet 1   nicotine (NICODERM CQ - DOSED IN MG/24 HOURS) 14 mg/24hr patch Place 1 patch (14 mg total) onto the skin daily. 42 patch 0    nitroGLYCERIN (NITROSTAT) 0.4 MG SL tablet Place 1 tablet (0.4 mg total) under the tongue every 5 (five) minutes as needed for chest pain. 50 tablet 0   ONETOUCH VERIO test strip USE TWICE A DAY AS DIRECTED 100 strip 0   rosuvastatin (CRESTOR) 10 MG tablet Take 1 tablet (10 mg total) by mouth daily. 90 tablet 3   No current facility-administered medications on file prior to visit.    Review of Systems    With his episodes of atrial fibrillation, he reports associated anxiety and a fluttering/tightness/discomfort in his throat /tachypalpitations.  He reports yawning that persist for long after his episode of atrial fibrillation.  He does note snoring and that his wife has mentioned this before to him. He denies chest pain, dyspnea, pnd, orthopnea, n, v, dizziness, syncope, edema, weight gain, or early satiety.  All other systems reviewed and are otherwise negative except as noted above.  Physical Exam    VS:  BP 112/70    Pulse 68    Ht '5\' 10"'  (1.778 m)    Wt 203 lb (92.1 kg)    BMI 29.13 kg/m  , BMI Body mass index is 29.13 kg/m. GEN: Well nourished, well developed, in no acute distress. HEENT: normal. Neck: Supple, no JVD, carotid bruits, or masses. Cardiac: RRR, no murmurs, rubs, or gallops. No clubbing, cyanosis, edema.  Radials/DP/PT 2+ and equal bilaterally.  Respiratory:  Respirations regular and unlabored, clear to auscultation bilaterally. GI: Soft, nontender, nondistended, BS + x 4. MS: no deformity or atrophy. Skin: warm and dry, no rash. Neuro:  Strength and sensation are intact. Psych: Normal affect.  Accessory Clinical Findings    ECG personally reviewed by me today - NSR, 68bpm, LVH, Q waves in inferior leads as noted on prior EKGs  - no acute changes.  VITALS Reviewed today   Temp Readings from Last 3 Encounters:  06/03/20 (!) 97.3 F (36.3 C) (Temporal)  02/16/19 97.8 F (36.6 C) (Oral)  11/13/18 97.7 F (36.5 C) (Oral)   BP Readings from Last 3 Encounters:   01/08/21 112/70  07/17/20 122/80  06/03/20 130/70   Pulse Readings from Last 3 Encounters:  01/08/21 68  07/17/20 60  06/03/20 67    Wt Readings from Last 3 Encounters:  01/08/21 203  lb (92.1 kg)  12/29/20 207 lb (93.9 kg)  09/05/20 207 lb (93.9 kg)     LABS  reviewed today   Lab Results  Component Value Date   WBC 7.4 12/11/2020   HGB 17.4 12/11/2020   HCT 49.7 12/11/2020   MCV 88 12/11/2020   PLT 200 12/11/2020   Lab Results  Component Value Date   CREATININE 1.06 12/11/2020   BUN 15 12/11/2020   NA 140 12/11/2020   K 4.6 12/11/2020   CL 104 12/11/2020   CO2 21 12/11/2020   Lab Results  Component Value Date   ALT 18 06/19/2019   AST 15 06/19/2019   ALKPHOS 64 06/19/2019   BILITOT 0.8 06/19/2019   Lab Results  Component Value Date   CHOL 153 09/26/2020   HDL 43.20 09/26/2020   LDLCALC 42 02/16/2019   LDLDIRECT 60.0 09/26/2020   TRIG 297.0 (H) 09/26/2020   CHOLHDL 4 09/26/2020    Lab Results  Component Value Date   HGBA1C 8.2 (H) 09/26/2020   Lab Results  Component Value Date   TSH 0.674 12/11/2020     STUDIES/PROCEDURES reviewed today   Cardiac monitoring 12/26/20 Patch Wear Time:  6 days and 18 hours  Normal sinus rhythm Patient had a min HR of 48 bpm, max HR of 203 bpm, and avg HR of 76 bpm.  1 run of Supraventricular Tachycardia occurred lasting 5 beats with a max rate of 203 bpm (avg 176 bpm). Isolated SVEs were rare  (<1.0%), SVE Couplets were rare (<1.0%), and no SVE Triplets were present. Isolated VEs were rare (<1.0%), VE Couplets were rare (<1.0%), and no VE Triplets were present. Ventricular Bigeminy and Trigeminy were present.   LHC 11/2017  Mid RCA lesion is 90% stenosed.  Dist RCA lesion is 100% stenosed.  Prox LAD lesion is 75% stenosed.  There is mild left ventricular systolic dysfunction.  LV end diastolic pressure is normal.  The left ventricular ejection fraction is 45-50% by visual estimate.  A drug-eluting stent  was successfully placed using a STENT SIERRA 3.50 X 33 MM.  Post intervention, there is a 0% residual stenosis. 1.  Significant two-vessel coronary artery disease with chronically occluded distal right coronary artery with left-to-right collaterals.  75% proximal LAD disease which was found to be significant by fractional flow reserve at 0.74. 2.  Mildly reduced LV systolic function with an EF of 45-50% with inferior wall hypokinesis 3.  Successful drug-eluting stent placement to the proximal LAD.  US Carotid Atherosclerotic disease. No evidence of hemodynamically significant  stenosis.    Assessment & Plan    Newly diagnosed paroxysmal atrial fibrillation with RVR -- NSR today.  Symptomatic when in atrial fibrillation.  Initial episode noted after 5-6 shots of bourbon.  1 week Zio XT without evidence of Afib.  Afib later identified by Kardia device rhythm strip, sent via MyChart to Charles Montana, NP.    Continue medication management with Eliquis 5 mg twice daily.  ASA and Plavix discontinued to reduce the risk of bleeding. Continue increased Toprol 50 mg daily for optimized rate control.      BMET today. Obtain repeat CBC and BMET s/p 1 to 2 weeks on Geraldine.  He started Eliquis 01/07/2021.  12/11/2020 TSH 0.674.  Long discussion regarding the possible Afib triggers/exacerbating factors, etiology, and associated comorbid conditions.  Discussed that avoiding EtOH / stimulants, increasing activity, and proper sleep / diet was associated with less recurrence of Afib.   Pulmonology referral for  sleep study, given his snoring and yawning as above.  Update echo as below.  Previous EF 45 to 50% by cath.  Discussed possible referral to EP today and to continue our conversation regarding antiarrhythmic therapy versus ablation for very symptomatic pts when in Afib, despite optimized ventricular rates.  Discussed that some patients are very symptomatic in A. Fib, and if the symptoms interfere with  daily living, these patients greatly benefit from ablation or antiarrhythmic therapy.  He prefers to think about EP referral and will revisit at RTC.    2V CAD s/p PCI to pLAD -- No anginal symptoms.  Euvolemic on exam.  Known CAD s/p LHC as above with PCI to pLAD and medical management of chronically occluded dRCA with L-R collaterals.  By 2018 cath, EF 45 to 50% by visual estimate with inferior wall hypokinesis.  Recommend repeat echo to reassess EF, wall motion, valvular function, heart size, and heart pressures given newly diagnosed atrial fibrillation.  EKG today without acute ST/T changes from previous.  If repeat echo with reduced EF, further ischemic work-up recommended at that time.  For now, continue GDMT with Eliquis in lieu of ASA, Toprol, Crestor.  Aggressive risk factor modification.  Recommend activity/dietary/lifestyle changes.  Smoking cessation advised.  HTN -- BP well controlled.  Continue current antihypertensive regimen.  Goal BP 130/80 or lower.  Hyperlipidemia, goal LDL less than 70 -- 09/2020 LDL 60 and at goal.  Continue Crestor 10 mg daily.  Continue LDL/LFT monitoring per PCP.  DM2 -- 6/21 A1c 7.6.  Continue Tradjenta and glycemic control/recommendations per PCP.  Current tobacco use -- Complete smoking cessation recommended to improve cardiovascular health.  Medication changes:  --None. Continue current medications. Labs ordered:  --BMET today --Repeat BMET / CBC in 1 to 2 weeks s/p Eliquis start. Studies / Imaging/referrals:  --Pulmonology referral to r/o sleep apnea.   --Update echo. --Consider EP referral - he will thinks about it, based on his sx. Future considerations:  --EP referral - reassess at RTC.   Disposition: RTC after echo or ~ 3 months.   Arvil Chaco, PA-C 01/08/2021

## 2021-01-13 ENCOUNTER — Telehealth: Payer: Self-pay | Admitting: *Deleted

## 2021-01-13 DIAGNOSIS — I480 Paroxysmal atrial fibrillation: Secondary | ICD-10-CM

## 2021-01-13 NOTE — Telephone Encounter (Signed)
-----   Message from Arvil Chaco, PA-C sent at 01/12/2021 10:01 AM EST ----- Regarding: Echo Do you mind reaching out to this guy and making sure he gets an echo? It does not appear to be ordered and he is due for one, especially given his new onset Afib. No rush on the echo - just whenever he has a chance if agreeable.   Thank you!

## 2021-01-13 NOTE — Telephone Encounter (Signed)
Spoke to pt, he is agreeable to have echo scheduled. Orders placed for a fib. Notified pt he would receive a call back from our office to schedule appt.

## 2021-01-15 ENCOUNTER — Other Ambulatory Visit: Payer: Self-pay

## 2021-01-15 ENCOUNTER — Other Ambulatory Visit (INDEPENDENT_AMBULATORY_CARE_PROVIDER_SITE_OTHER): Payer: Managed Care, Other (non HMO)

## 2021-01-15 DIAGNOSIS — E1159 Type 2 diabetes mellitus with other circulatory complications: Secondary | ICD-10-CM | POA: Diagnosis not present

## 2021-01-15 DIAGNOSIS — I480 Paroxysmal atrial fibrillation: Secondary | ICD-10-CM | POA: Diagnosis not present

## 2021-01-15 LAB — BASIC METABOLIC PANEL
BUN: 14 mg/dL (ref 6–23)
CO2: 29 mEq/L (ref 19–32)
Calcium: 9.3 mg/dL (ref 8.4–10.5)
Chloride: 101 mEq/L (ref 96–112)
Creatinine, Ser: 1.07 mg/dL (ref 0.40–1.50)
GFR: 74.68 mL/min (ref >60.00)
Glucose, Bld: 218 mg/dL — ABNORMAL HIGH (ref 70–99)
Potassium: 4.4 mEq/L (ref 3.5–5.1)
Sodium: 137 mEq/L (ref 135–145)

## 2021-01-15 LAB — CBC WITH DIFFERENTIAL/PLATELET
Basophils Absolute: 0 10*3/uL (ref 0.0–0.1)
Basophils Relative: 0.5 % (ref 0.0–3.0)
Eosinophils Absolute: 0.2 10*3/uL (ref 0.0–0.7)
Eosinophils Relative: 2.3 % (ref 0.0–5.0)
HCT: 48.7 % (ref 39.0–52.0)
Hemoglobin: 16.8 g/dL (ref 13.0–17.0)
Lymphocytes Relative: 24.5 % (ref 12.0–46.0)
Lymphs Abs: 2.1 10*3/uL (ref 0.7–4.0)
MCHC: 34.5 g/dL (ref 30.0–36.0)
MCV: 89.9 fl (ref 78.0–100.0)
Monocytes Absolute: 0.6 10*3/uL (ref 0.1–1.0)
Monocytes Relative: 7.5 % (ref 3.0–12.0)
Neutro Abs: 5.5 10*3/uL (ref 1.4–7.7)
Neutrophils Relative %: 65.2 % (ref 43.0–77.0)
Platelets: 208 10*3/uL (ref 150.0–400.0)
RBC: 5.42 Mil/uL (ref 4.22–5.81)
RDW: 12.6 % (ref 11.5–15.5)
WBC: 8.5 10*3/uL (ref 4.0–10.5)

## 2021-01-15 LAB — POCT GLYCOSYLATED HEMOGLOBIN (HGB A1C): Hemoglobin A1C: 7.8 % — AB (ref 4.0–5.6)

## 2021-01-22 NOTE — Telephone Encounter (Signed)
Echo scheduled 01/29/21.

## 2021-01-26 ENCOUNTER — Other Ambulatory Visit: Payer: Self-pay | Admitting: Family Medicine

## 2021-01-26 DIAGNOSIS — E1159 Type 2 diabetes mellitus with other circulatory complications: Secondary | ICD-10-CM

## 2021-01-29 ENCOUNTER — Other Ambulatory Visit: Payer: Self-pay

## 2021-01-29 ENCOUNTER — Ambulatory Visit (INDEPENDENT_AMBULATORY_CARE_PROVIDER_SITE_OTHER): Payer: Managed Care, Other (non HMO)

## 2021-01-29 DIAGNOSIS — I480 Paroxysmal atrial fibrillation: Secondary | ICD-10-CM | POA: Diagnosis not present

## 2021-01-29 LAB — ECHOCARDIOGRAM COMPLETE
Area-P 1/2: 2.58 cm2
S' Lateral: 2.6 cm

## 2021-04-12 NOTE — Progress Notes (Signed)
Date:  04/13/2021   ID:  Charles Hickman, DOB 1961/04/28, MRN [id number]  Patient Location:  [place] [address]   Provider location:   [facility], [place] office  PCP:  Leone Haven, MD  Cardiologist:  Arvid Right [facility]   Chief Complaint  Patient presents with  . 3 month follow up     Patient c/o a decrease in his blood pressure with having to stop the Lisinopril. Medications reviewed by the patient verbally.      History of Present Illness:    Charles Hickman is a 60 y.o. male with  past medical history of Smoker, reports that he stopped DM II  HBA1C 7.8 Diffuse large cell lymphoma,  6 rounds R-CHOP 2 years ago  Hypertension CVA, left leg , resolved quickly, BP was elevted in the ER 05/2011 lacunar infarction, carotid atheroscerosis CAD December 2018 catheterization, stent placed LAD, chronically occluded distal RCA Who presents for follow up of his CAD, stent to LAD  LOV with JV 12/2020 afib 12/21, 1/22 couple hours Got anxious PAF, changed to eliquis Could feel the atrial fib  Echo 01/2021 reviewed on today's visit EF 55%  Reviewed medications Takes crestor 10 metoprol 12.5 BID eliquis 5 BID Off lisinopril  Smoking 1/2 PPD, has not been able to quit Does not want chantix or vapor   Labs reviewed on today's visit HBA1C 7.6  EKG personally reviewed by myself on todays visit NSR rate 59 bpm, old inferior MI  Past medical history reviewed Previous stress test ordered for chest pain symptoms Showing large perfusion defect inferior wall consistent with previous MI Ischemia mid to apical inferior wall ejection fraction 34% 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  cardiac catheterization December 15, 2017 showing chronically occluded distal RCA with left-to-right collaterals Also had 75% proximal LAD disease FFR 0.74 ejection fraction 45% Had successful drug-eluting stent placed in the proximal LAD  He reports having episode of  chest pain back in May 2017 Near syncope, diaphoresis Did not go to the hospital "chilled the rest of day" Symptoms resolved on their own without intervention  Past medical history reviewed Review of previous history shows he had colonoscopy 2016 leading to diagnosis of lymphoma, followed by chemotherapy He had a PET CT x2 showing moderate aortic atherosclerosis No mention of coronary calcifications noted  Echo during chemo  07/2015 Normal left ventricular systolic function, ejection fraction > 55%  Aortic sclerosis  Normal right ventricular systolic function  Neuropathy in legs, made worse by chemotherapy for the patient   Prior CV studies:   The following studies were reviewed today:    Past Medical History:  Diagnosis Date  . Abnormal EKG 11/15/2017  . Abnormal stress test   . Atrial fibrillation ([place])   . CAD in native artery    a. 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 No EKG changes concerning for ischemia at peak stress or in recovery. Mod to high risk scan; b. LHC 12/15/17: pLAD 75% w/ FFR 0.74 s/p PCI/DES, mRCA 90%, CTO dRCA w/ L-R colatts, EF 45-50%, inf HK   . Diabetes mellitus ([place])   . HCV (hepatitis C virus)   . HLD (hyperlipidemia)   . Hypertension   . Large cell lymphoma ([place])    a. s/p 6 cycles  of R-CHOP  . Peripheral neuropathy   . Stroke ([place])   . Systolic dysfunction    a. TTE 8/16: EF > 55%, aortic sclerosis, normal RV systolic function; b. LHC 12/15/17: EF 45-50%, inf HK   Past Surgical History:  Procedure Laterality Date  . APPENDECTOMY    . INTRAVASCULAR PRESSURE WIRE/FFR STUDY N/A 12/15/2017   Procedure: INTRAVASCULAR PRESSURE WIRE/FFR STUDY;  Surgeon: Wellington Hampshire, MD;  Location: [place] [facility];  Service: Cardiovascular;  Laterality: N/A;  . LEFT HEART CATH AND CORONARY ANGIOGRAPHY N/A 12/15/2017    Procedure: LEFT HEART CATH AND CORONARY ANGIOGRAPHY;  Surgeon: Wellington Hampshire, MD;  Location: [place] [facility];  Service: Cardiovascular;  Laterality: N/A;  . TONSILLECTOMY AND ADENOIDECTOMY       Current Meds  Medication Sig  . apixaban (ELIQUIS) 5 MG TABS tablet Take 1 tablet (5 mg total) by mouth 2 (two) times daily.  . blood glucose meter kit and supplies KIT Dispense based on patient and insurance preference. Use 2 times daily as directed. (FOR ICD-10 E11.9).  Marland Kitchen diltiazem (CARDIZEM) 30 MG tablet Take 1 tablet (30 mg total) by mouth 3 (three) times daily as needed.  Marland Kitchen JARDIANCE 25 MG TABS tablet TAKE 1 TABLET BY MOUTH DAILY  . metoprolol succinate (TOPROL-XL) 50 MG 24 hr tablet Take 1 tablet (50 mg total) by mouth daily. Take with or immediately following a meal.  . nitroGLYCERIN (NITROSTAT) 0.4 MG SL tablet Place 1 tablet (0.4 mg total) under the tongue every 5 (five) minutes as needed for chest pain.  Glory Rosebush VERIO test strip USE TWICE A DAY AS DIRECTED  . rosuvastatin (CRESTOR) 10 MG tablet Take 1 tablet (10 mg total) by mouth daily.     Allergies:   Patient has no known allergies.   Social History   Tobacco Use  . Smoking status: Current Every Day Smoker    Packs/day: 0.50  . Smokeless tobacco: Never Used  . Tobacco comment: Weating a patch.   Substance Use Topics  . Alcohol use: Yes    Comment: occasional  . Drug use: No     Current Outpatient Medications on File Prior to Visit  Medication Sig Dispense Refill  . apixaban (ELIQUIS) 5 MG TABS tablet Take 1 tablet (5 mg total) by mouth 2 (two) times daily. 60 tablet 11  . blood glucose meter kit and supplies KIT Dispense based on patient and insurance preference. Use 2 times daily as directed. (FOR ICD-10 E11.9). 1 each 0  . JARDIANCE 25 MG TABS tablet TAKE 1 TABLET BY MOUTH DAILY 90 tablet 2  . metoprolol succinate (TOPROL-XL) 50 MG 24 hr tablet Take 1 tablet (50 mg total) by mouth daily. Take with or  immediately following a meal. 90 tablet 1  . nitroGLYCERIN (NITROSTAT) 0.4 MG SL tablet Place 1 tablet (0.4 mg total) under the tongue every 5 (five) minutes as needed for chest pain. 50 tablet 0  . ONETOUCH VERIO test strip USE TWICE A DAY AS DIRECTED 100 strip 0  . rosuvastatin (CRESTOR) 10 MG tablet Take 1 tablet (10 mg total) by mouth daily. 90 tablet 3   No current facility-administered medications on file prior to visit.     Family Hx: The patient's family history includes Dementia in his maternal grandmother; Diabetes in his father and mother; Hyperlipidemia in his mother; Hypertension in his father and mother; Kidney disease in his father; Stroke in his mother.  ROS:   Please see the  history of present illness.    Review of Systems  Constitutional: Negative.   Respiratory: Negative.   Cardiovascular: Negative.   Gastrointestinal: Negative.   Musculoskeletal: Negative.   Neurological: Negative.   Psychiatric/Behavioral: Negative.   All other systems reviewed and are negative.    Labs/Other Tests and Data Reviewed:    Recent Labs: 12/11/2020: TSH 0.674 01/15/2021: BUN 14; Creatinine, Ser 1.07; Hemoglobin 16.8; Platelets 208.0; Potassium 4.4; Sodium 137   Recent Lipid Panel Lab Results  Component Value Date/Time   CHOL 153 09/26/2020 08:47 AM   CHOL 216 (H) 10/05/2017 08:53 AM   TRIG 297.0 (H) 09/26/2020 08:47 AM   HDL 43.20 09/26/2020 08:47 AM   HDL 37 (L) 10/05/2017 08:53 AM   CHOLHDL 4 09/26/2020 08:47 AM   LDLCALC 42 02/16/2019 09:39 AM   LDLCALC 116 (H) 10/05/2017 08:53 AM   LDLDIRECT 60.0 09/26/2020 08:47 AM    Wt Readings from Last 3 Encounters:  04/13/21 206 lb (93.4 kg)  01/08/21 203 lb (92.1 kg)  12/29/20 207 lb (93.9 kg)     Exam:    BP 120/80 (BP Location: Left Arm, Patient Position: Sitting, Cuff Size: Normal)   Pulse (!) 59   Ht '5\' 10"'  (1.778 m)   Wt 206 lb (93.4 kg)   SpO2 98%   BMI 29.56 kg/m  Constitutional:  oriented to person, place,  and time. No distress.  HENT:  Head: Grossly normal Eyes:  no discharge. No scleral icterus.  Neck: No JVD, no carotid bruits  Cardiovascular: Regular rate and rhythm, no murmurs appreciated Pulmonary/Chest: Clear to auscultation bilaterally, no wheezes or rails Abdominal: Soft.  no distension.  no tenderness.  Musculoskeletal: Normal range of motion Neurological:  normal muscle tone. Coordination normal. No atrophy Skin: Skin warm and dry Psychiatric: normal affect, pleasant   ASSESSMENT & PLAN:    Coronary artery disease of native artery of native heart with stable angina pectoris ([place]) -  Recommend he stay on aspirin Plavix for now Cholesterol at goal Strong recommendation for smoking cessation  Paroxysmal atrial fibrillation Recommended he stay on metoprolol at current dose, also on Eliquis Suggested he take diltiazem 30 up to 60 mg as needed for breakthrough atrial fibrillation  Mixed hyperlipidemia - Cholesterol is at goal on the current lipid regimen. No changes to the medications were made.  Type 2 diabetes mellitus with other circulatory complication, without long-term current use of insulin (HCC) -  Hemoglobin A1c improved now in the 7 range  We have encouraged continued exercise, careful diet management in an effort to lose weight.  Essential hypertension -  Blood pressure stable  Tobacco abuse -  Smoking 1/2  We have encouraged him to continue to work on weaning his cigarettes and smoking cessation. He will continue to work on this and does not want any assistance with chantix.    Total encounter time more than 25 minutes  Greater than 50% was spent in counseling and coordination of care with the patient   Signed, Ida Rogue, MD  04/13/2021 12:04 PM    [place] Office [address] [place] [address], [place], [place] [address]

## 2021-04-13 ENCOUNTER — Ambulatory Visit: Payer: Managed Care, Other (non HMO) | Admitting: Cardiovascular Disease

## 2021-04-13 ENCOUNTER — Encounter: Payer: Self-pay | Admitting: Cardiovascular Disease

## 2021-04-13 ENCOUNTER — Other Ambulatory Visit: Payer: Self-pay

## 2021-04-13 VITALS — BP 120/80 | HR 59 | Ht 70.0 in | Wt 206.0 lb

## 2021-04-13 DIAGNOSIS — I5020 Unspecified systolic (congestive) heart failure: Secondary | ICD-10-CM

## 2021-04-13 DIAGNOSIS — Z8579 Personal history of other malignant neoplasms of lymphoid, hematopoietic and related tissues: Secondary | ICD-10-CM

## 2021-04-13 DIAGNOSIS — I2510 Atherosclerotic heart disease of native coronary artery without angina pectoris: Secondary | ICD-10-CM

## 2021-04-13 DIAGNOSIS — E785 Hyperlipidemia, unspecified: Secondary | ICD-10-CM

## 2021-04-13 DIAGNOSIS — I10 Essential (primary) hypertension: Secondary | ICD-10-CM

## 2021-04-13 DIAGNOSIS — Z720 Tobacco use: Secondary | ICD-10-CM

## 2021-04-13 DIAGNOSIS — I480 Paroxysmal atrial fibrillation: Secondary | ICD-10-CM | POA: Diagnosis not present

## 2021-04-13 DIAGNOSIS — R002 Palpitations: Secondary | ICD-10-CM | POA: Diagnosis not present

## 2021-04-13 MED ORDER — DILTIAZEM HCL 30 MG PO TABS: 30.0000 mg | ORAL_TABLET | Freq: Three times a day (TID) | ORAL | 1 refills | Status: DC | PRN

## 2021-04-13 NOTE — Patient Instructions (Addendum)
Medication Instructions:  Diltiazem 30 to 60 mg pills as needed for atrial fib breakthrough  If you need a refill on your cardiac medications before your next appointment, please call your pharmacy.    Lab work: No new labs needed   If you have labs (blood work) drawn today and your tests are completely normal, you will receive your results only by: Marland Kitchen MyChart Message (if you have MyChart) OR . A paper copy in the mail If you have any lab test that is abnormal or we need to change your treatment, we will call you to review the results.   Testing/Procedures: No new testing needed   Follow-Up: At [facility], you and your health needs are our priority.  As part of our continuing mission to provide you with exceptional heart care, we have created designated Provider Care Teams.  These Care Teams include your primary Cardiologist (physician) and Advanced Practice Providers (APPs -  Physician Assistants and Nurse Practitioners) who all work together to provide you with the care you need, when you need it.  . You will need a follow up appointment in 12 months  . Providers on your designated Care Team:   . Murray Hodgkins, NP . Christell Faith, PA-C . Marrianne Mood, PA-C  Any Other Special Instructions Will Be Listed Below (If Applicable).  COVID-19 Vaccine Information can be found at: [email or web address].uk For questions related to vaccine distribution or appointments, please email [email or web address] or call [phone].

## 2021-05-07 ENCOUNTER — Other Ambulatory Visit: Payer: Self-pay | Admitting: Cardiovascular Disease

## 2021-06-22 ENCOUNTER — Other Ambulatory Visit: Payer: Self-pay | Admitting: Family Medicine

## 2021-06-28 ENCOUNTER — Other Ambulatory Visit: Payer: Self-pay | Admitting: Family

## 2021-07-21 ENCOUNTER — Other Ambulatory Visit: Payer: Self-pay | Admitting: Family Medicine

## 2021-07-21 DIAGNOSIS — I2510 Atherosclerotic heart disease of native coronary artery without angina pectoris: Secondary | ICD-10-CM

## 2021-08-30 ENCOUNTER — Other Ambulatory Visit: Payer: Self-pay | Admitting: Family

## 2021-08-30 DIAGNOSIS — E785 Hyperlipidemia, unspecified: Secondary | ICD-10-CM

## 2021-12-05 ENCOUNTER — Other Ambulatory Visit: Payer: Self-pay | Admitting: Family

## 2021-12-07 NOTE — Telephone Encounter (Signed)
Prescription refill request for Eliquis received. Indication: Afib Last office visit:4/22 Scr:1.0 Age: 60 Weight:93.4 kg  Prescription refilled

## 2021-12-15 ENCOUNTER — Other Ambulatory Visit: Payer: Self-pay | Admitting: Family

## 2022-02-21 ENCOUNTER — Other Ambulatory Visit: Payer: Self-pay | Admitting: Cardiovascular Disease

## 2022-02-21 ENCOUNTER — Other Ambulatory Visit: Payer: Self-pay | Admitting: Family Medicine

## 2022-02-21 DIAGNOSIS — E785 Hyperlipidemia, unspecified: Secondary | ICD-10-CM

## 2022-05-24 ENCOUNTER — Other Ambulatory Visit: Payer: Self-pay | Admitting: Cardiovascular Disease

## 2022-05-24 DIAGNOSIS — E785 Hyperlipidemia, unspecified: Secondary | ICD-10-CM

## 2022-05-24 NOTE — Telephone Encounter (Signed)
Please schedule overdue office visit for refills. Thank you! 

## 2022-05-24 NOTE — Telephone Encounter (Signed)
Attempted to schedule no ans no vm  

## 2022-05-26 NOTE — Telephone Encounter (Signed)
Attempted to schedule.  LMOV to call office.  ° °

## 2022-05-26 NOTE — Progress Notes (Signed)
Date:  05/28/2022   ID:  Charles Hickman, DOB June 26, 1961, MRN [id number]  Patient Location:  [place] [address]   Provider location:   [facility], [place] office  PCP:  Leone Haven, MD  Cardiologist:  Arvid Right [facility]   Chief Complaint  Patient presents with   Follow-up    OD 12 month f/u no complaints today. Meds reviewed verbally with pt.     History of Present Illness:    Charles Hickman is a 61 y.o. male with  past medical history of Smoker, reports that he stopped DM II   Diffuse large cell lymphoma,  6 rounds R-CHOP 2 years ago  Hypertension CVA, left leg , resolved quickly, BP was elevted in the ER 05/2011  lacunar infarction, carotid atheroscerosis CAD December 2018 catheterization, stent placed LAD, chronically occluded distal RCA Who presents for follow up of his CAD, stent to LAD, PAF  Last seen in clinic by myself April 2022 In follow-up today reports doing well overall No atrial fib that he is aware of,  monitors with cardiomobile Recent URI for one month, getting better  BP stable Works in refrigeration at [employer] episode of afib 12/21, 1/22 couple hours Reports he was symptomatic and could appreciate the arrhythmia  Echo 01/2021 reviewed EF 55%  Reviewed medications Takes crestor 10 metoprol succinate 50 daily eliquis 5 BID Off lisinopril  Smoking 1/2 PPD, has not been able to quit Does not want chantix or vapor Discussed again with him today   Labs reviewed on today's visit HBA1C 7.8 in January down from 8.2 in 2022 LDL 60 in October 2021 Normal renal function  EKG personally reviewed by myself on todays visit NSR rate 59 bpm, old inferior MI  Past medical history reviewed Previous stress test ordered for chest pain symptoms Showing large perfusion defect inferior wall consistent with previous MI Ischemia mid to apical inferior wall ejection fraction 34% Resting EKG with old inferior  MI  cardiac catheterization December 15, 2017 showing chronically occluded distal RCA with left-to-right collaterals Also had 75% proximal LAD disease FFR 0.74 ejection fraction 45% Had successful drug-eluting stent placed in the proximal LAD  Past medical history reviewed Review of previous history shows he had colonoscopy 2016 leading to diagnosis of lymphoma, followed by chemotherapy He had a PET CT x2 showing moderate aortic atherosclerosis No mention of coronary calcifications noted  Echo during chemo  07/2015 Normal left ventricular systolic function, ejection fraction > 55%  Aortic sclerosis  Normal right ventricular systolic function  Neuropathy in legs, made worse by chemotherapy for the patient  Past Medical History:  Diagnosis Date   Abnormal EKG 11/15/2017   Abnormal stress test    Atrial fibrillation ([place])    CAD in native artery    a. 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 No EKG changes concerning for ischemia at peak stress or in recovery. Mod to high risk scan; b. LHC 12/15/17: pLAD 75% w/ FFR 0.74 s/p PCI/DES, mRCA 90%, CTO dRCA w/ L-R colatts, EF 45-50%, inf HK    Diabetes mellitus (HCC)    HCV (hepatitis C virus)    HLD (hyperlipidemia)    Hypertension    Large cell lymphoma ([place])    a. s/p 6 cycles of R-CHOP  Peripheral neuropathy    Stroke [facility])    Systolic dysfunction    a. TTE 8/16: EF > 55%, aortic sclerosis, normal RV systolic function; b. LHC 12/15/17: EF 45-50%, inf HK   Past Surgical History:  Procedure Laterality Date   APPENDECTOMY     INTRAVASCULAR PRESSURE WIRE/FFR STUDY N/A 12/15/2017   Procedure: INTRAVASCULAR PRESSURE WIRE/FFR STUDY;  Surgeon: Wellington Hampshire, MD;  Location: [place] [facility];  Service: Cardiovascular;  Laterality: N/A;   LEFT HEART CATH AND CORONARY ANGIOGRAPHY N/A 12/15/2017    Procedure: LEFT HEART CATH AND CORONARY ANGIOGRAPHY;  Surgeon: Wellington Hampshire, MD;  Location: [place] [facility];  Service: Cardiovascular;  Laterality: N/A;   TONSILLECTOMY AND ADENOIDECTOMY       Current Meds  Medication Sig   apixaban (ELIQUIS) 5 MG TABS tablet TAKE 1 TABLET BY MOUTH TWICE A DAY--DISCONTINUING PLAVIX/ASPIRIN   blood glucose meter kit and supplies KIT Dispense based on patient and insurance preference. Use 2 times daily as directed. (FOR ICD-10 E11.9).   diltiazem (CARDIZEM) 30 MG tablet Take 1 tablet (30 mg total) by mouth 3 (three) times daily as needed.   JARDIANCE 25 MG TABS tablet TAKE 1 TABLET BY MOUTH EVERY DAY   metoprolol succinate (TOPROL-XL) 50 MG 24 hr tablet TAKE 1 TABLET BY MOUTH DAILY. TAKE WITH OR IMMEDIATELY FOLLOWING A MEAL.   nitroGLYCERIN (NITROSTAT) 0.4 MG SL tablet PLACE 1 TABLET (0.4 MG TOTAL) UNDER THE TONGUE EVERY 5 (FIVE) MINUTES AS NEEDED FOR CHEST PAIN.   ONETOUCH VERIO test strip USE TWICE A DAY AS DIRECTED   rosuvastatin (CRESTOR) 10 MG tablet Take 1 tablet (10 mg total) by mouth daily. PLEASE SCHEDULE OFFICE VISIT FOR FURTHER REFILLS. THANK YOU!     Allergies:   Patient has no known allergies.   Social History   Tobacco Use   Smoking status: Every Day    Packs/day: 0.50    Types: Cigarettes   Smokeless tobacco: Never   Tobacco comments:    Weating a patch.   Substance Use Topics   Alcohol use: Yes    Comment: occasional   Drug use: No     Current Outpatient Medications on File Prior to Visit  Medication Sig Dispense Refill   apixaban (ELIQUIS) 5 MG TABS tablet TAKE 1 TABLET BY MOUTH TWICE A DAY--DISCONTINUING PLAVIX/ASPIRIN 60 tablet 5   blood glucose meter kit and supplies KIT Dispense based on patient and insurance preference. Use 2 times daily as directed. (FOR ICD-10 E11.9). 1 each 0   diltiazem (CARDIZEM) 30 MG tablet Take 1 tablet (30 mg total) by mouth 3 (three) times daily as needed. 90 tablet 1   JARDIANCE 25 MG  TABS tablet TAKE 1 TABLET BY MOUTH EVERY DAY 90 tablet 2   metoprolol succinate (TOPROL-XL) 50 MG 24 hr tablet TAKE 1 TABLET BY MOUTH DAILY. TAKE WITH OR IMMEDIATELY FOLLOWING A MEAL. 90 tablet 3   nitroGLYCERIN (NITROSTAT) 0.4 MG SL tablet PLACE 1 TABLET (0.4 MG TOTAL) UNDER THE TONGUE EVERY 5 (FIVE) MINUTES AS NEEDED FOR CHEST PAIN. 25 tablet 1   ONETOUCH VERIO test strip USE TWICE A DAY AS DIRECTED 100 strip 0   rosuvastatin (CRESTOR) 10 MG tablet Take 1 tablet (10 mg total) by mouth daily. PLEASE SCHEDULE OFFICE VISIT FOR FURTHER REFILLS. THANK YOU! 90 tablet 0   No current facility-administered medications on file prior to visit.     Family Hx: The patient's family history includes Dementia in his maternal  grandmother; Diabetes in his father and mother; Hyperlipidemia in his mother; Hypertension in his father and mother; Kidney disease in his father; Stroke in his mother.  ROS:   Please see the history of present illness.    Review of Systems  Constitutional: Negative.   Respiratory: Negative.    Cardiovascular: Negative.   Gastrointestinal: Negative.   Musculoskeletal: Negative.   Neurological: Negative.   Psychiatric/Behavioral: Negative.    All other systems reviewed and are negative.    Labs/Other Tests and Data Reviewed:    Recent Labs: No results found for requested labs within last 365 days.   Recent Lipid Panel Lab Results  Component Value Date/Time   CHOL 153 09/26/2020 08:47 AM   CHOL 216 (H) 10/05/2017 08:53 AM   TRIG 297.0 (H) 09/26/2020 08:47 AM   HDL 43.20 09/26/2020 08:47 AM   HDL 37 (L) 10/05/2017 08:53 AM   CHOLHDL 4 09/26/2020 08:47 AM   LDLCALC 42 02/16/2019 09:39 AM   LDLCALC 116 (H) 10/05/2017 08:53 AM   LDLDIRECT 60.0 09/26/2020 08:47 AM    Wt Readings from Last 3 Encounters:  05/28/22 201 lb 6 oz (91.3 kg)  04/13/21 206 lb (93.4 kg)  01/08/21 203 lb (92.1 kg)     Exam:    BP 120/70 (BP Location: Left Arm, Patient Position: Sitting, Cuff  Size: Normal)   Pulse 66   Ht _0  (1.778 m)   Wt 201 lb 6 oz (91.3 kg)   SpO2 96%   BMI 28.89 kg/m  Constitutional:  oriented to person, place, and time. No distress.  HENT:  Head: Grossly normal Eyes:  no discharge. No scleral icterus.  Neck: No JVD, no carotid bruits  Cardiovascular: Regular rate and rhythm, no murmurs appreciated Pulmonary/Chest: Clear to auscultation bilaterally, no wheezes or rails Abdominal: Soft.  no distension.  no tenderness.  Musculoskeletal: Normal range of motion Neurological:  normal muscle tone. Coordination normal. No atrophy Skin: Skin warm and dry Psychiatric: normal affect, pleasant  ASSESSMENT & PLAN:    Coronary artery disease of native artery of native heart with stable angina pectoris (HCC) -  Off aspirin Plavix, Eliquis in place Cholesterol at goal, we have requested he do some lab work He is indicated he prefers to have this done with primary care Strong recommendation for smoking cessation Does not want Chantix  Paroxysmal atrial fibrillation Recommended he stay on metoprolol at current dose, also on Eliquis Suggested he take diltiazem 30 up to 60 mg as needed for breakthrough atrial fibrillation Denies paroxysmal episodes, uses cardiomobile to monitor  Mixed hyperlipidemia - Continue Crestor, lab work offered, he prefers to have this done through primary care  Type 2 diabetes mellitus with other circulatory complication, without long-term current use of insulin ([place]) -  We have encouraged continued exercise, careful diet management  Last A1c 7.8 over 1-year ago  Essential hypertension -  Blood pressure is well controlled on today's visit. No changes made to the medications.  Tobacco abuse -  Smoking 1/2  We have encouraged him to continue to work on weaning his cigarettes and smoking cessation. He will continue to work on this and does not want any assistance with chantix.     Total encounter time more than 30 minutes   Greater than 50% was spent in counseling and coordination of care with the patient   Signed, Ida Rogue, MD  05/28/2022 8:22 AM    [place] Office [address],  [place], [place] [address]

## 2022-05-28 ENCOUNTER — Ambulatory Visit: Payer: Managed Care, Other (non HMO) | Admitting: Cardiovascular Disease

## 2022-05-28 ENCOUNTER — Encounter: Payer: Self-pay | Admitting: Cardiovascular Disease

## 2022-05-28 ENCOUNTER — Other Ambulatory Visit
Admission: RE | Admit: 2022-05-28 | Discharge: 2022-05-28 | Disposition: A | Discharge status: Home / Self Care | Payer: Managed Care, Other (non HMO) | Attending: Cardiovascular Disease | Admitting: Cardiovascular Disease

## 2022-05-28 VITALS — BP 120/70 | HR 66 | Ht 70.0 in | Wt 201.4 lb

## 2022-05-28 DIAGNOSIS — I480 Paroxysmal atrial fibrillation: Secondary | ICD-10-CM | POA: Insufficient documentation

## 2022-05-28 DIAGNOSIS — E785 Hyperlipidemia, unspecified: Secondary | ICD-10-CM | POA: Diagnosis not present

## 2022-05-28 DIAGNOSIS — I2510 Atherosclerotic heart disease of native coronary artery without angina pectoris: Secondary | ICD-10-CM | POA: Diagnosis not present

## 2022-05-28 DIAGNOSIS — Z720 Tobacco use: Secondary | ICD-10-CM

## 2022-05-28 DIAGNOSIS — R002 Palpitations: Secondary | ICD-10-CM

## 2022-05-28 DIAGNOSIS — Z8572 Personal history of non-Hodgkin lymphomas: Secondary | ICD-10-CM

## 2022-05-28 DIAGNOSIS — I10 Essential (primary) hypertension: Secondary | ICD-10-CM

## 2022-05-28 DIAGNOSIS — I5020 Unspecified systolic (congestive) heart failure: Secondary | ICD-10-CM

## 2022-05-28 DIAGNOSIS — Z79899 Other long term (current) drug therapy: Secondary | ICD-10-CM

## 2022-05-28 LAB — CBC
HCT: 46.5 % (ref 39.0–52.0)
Hemoglobin: 15.5 g/dL (ref 13.0–17.0)
MCH: 30.1 pg (ref 26.0–34.0)
MCHC: 33.3 g/dL (ref 30.0–36.0)
MCV: 90.3 fL (ref 80.0–100.0)
Platelets: 202 10*3/uL (ref 150–400)
RBC: 5.15 MIL/uL (ref 4.22–5.81)
RDW: 12.4 % (ref 11.5–15.5)
WBC: 7.8 10*3/uL (ref 4.0–10.5)
nRBC: 0 % (ref 0.0–0.2)

## 2022-05-28 LAB — BASIC METABOLIC PANEL
Anion gap: 6 (ref 5–15)
BUN: 14 mg/dL (ref 8–23)
CO2: 29 mmol/L (ref 22–32)
Calcium: 9.3 mg/dL (ref 8.9–10.3)
Chloride: 105 mmol/L (ref 98–111)
Creatinine, Ser: 0.79 mg/dL (ref 0.61–1.24)
GFR, Estimated: >60 mL/min (ref >60)
Glucose, Bld: 219 mg/dL — ABNORMAL HIGH (ref 70–99)
Potassium: 4.2 mmol/L (ref 3.5–5.1)
Sodium: 140 mmol/L (ref 135–145)

## 2022-05-28 MED ORDER — ROSUVASTATIN CALCIUM 10 MG PO TABS: 10.0000 mg | ORAL_TABLET | Freq: Every day | ORAL | 3 refills | Status: DC

## 2022-05-28 MED ORDER — METOPROLOL SUCCINATE ER 50 MG PO TB24: 50.0000 mg | ORAL_TABLET | Freq: Every day | ORAL | 3 refills | Status: DC

## 2022-05-28 MED ORDER — DILTIAZEM HCL 30 MG PO TABS: 30.0000 mg | ORAL_TABLET | Freq: Three times a day (TID) | ORAL | 2 refills | Status: DC | PRN

## 2022-05-28 MED ORDER — NITROGLYCERIN 0.4 MG SL SUBL: 0.4000 mg | SUBLINGUAL_TABLET | SUBLINGUAL | 3 refills | Status: DC | PRN

## 2022-05-28 NOTE — Patient Instructions (Addendum)
Medication Instructions:  No changes  If you need a refill on your cardiac medications before your next appointment, please call your pharmacy.   Lab work: - Your physician recommends that you lab work today if possible (in order for [place] to be able to refill your blood thinner): BMP/ [place] Entrance at [facility] 1st desk on the right to check in (REGISTRATION)  Lab hours: Monday- Friday (7:30 am- 5:30 pm)    Testing/Procedures: No new testing needed  Follow-Up: At [facility], you and your health needs are our priority.  As part of our continuing mission to provide you with exceptional heart care, we have created designated Provider Care Teams.  These Care Teams include your primary Cardiologist (physician) and Advanced Practice Providers (APPs -  Physician Assistants and Nurse Practitioners) who all work together to provide you with the care you need, when you need it.  You will need a follow up appointment in 12 months  Providers on your designated Care Team:   Murray Hodgkins, NP Christell Faith, PA-C Cadence Kathlen Mody, [place]  COVID-19 Vaccine Information can be found at: [email or web address].uk For questions related to vaccine distribution or appointments, please email [email or web address] or call [phone].

## 2022-07-05 NOTE — Telephone Encounter (Signed)
Attempted to schedule.  

## 2022-07-13 NOTE — Telephone Encounter (Signed)
Attempted to schedule no ans no vm  

## 2022-07-24 ENCOUNTER — Other Ambulatory Visit: Payer: Self-pay | Admitting: Cardiovascular Disease

## 2022-08-05 ENCOUNTER — Other Ambulatory Visit: Payer: Self-pay | Admitting: Family

## 2022-08-05 ENCOUNTER — Telehealth: Payer: Self-pay | Admitting: Cardiovascular Disease

## 2022-08-05 MED ORDER — APIXABAN 5 MG PO TABS: ORAL_TABLET | ORAL | 5 refills | Status: DC

## 2022-08-05 NOTE — Telephone Encounter (Signed)
Prescription refill request for Eliquis received. Indication:Afib Last office visit:6/23 Scr:0.7 Age: 61 Weight:91.3 kg  Prescription refilled

## 2022-08-05 NOTE — Telephone Encounter (Signed)
*  STAT* If patient is at the pharmacy, call can be transferred to refill team.   1. Which medications need to be refilled? (please list name of each medication and dose if known) apixaban (ELIQUIS) 5 MG TABS tablet  2. Which pharmacy/location (including street and city if local pharmacy) is medication to be sent to? [id number] [place] [place]. Do they need a 30 day or 90 day supply? [address]

## 2022-08-05 NOTE — Telephone Encounter (Signed)
Please review for refill. Thank you! 

## 2022-08-05 NOTE — Telephone Encounter (Signed)
Refill Request.  

## 2023-02-07 ENCOUNTER — Other Ambulatory Visit: Payer: Self-pay | Admitting: Cardiovascular Disease

## 2023-02-07 DIAGNOSIS — I480 Paroxysmal atrial fibrillation: Secondary | ICD-10-CM

## 2023-02-07 NOTE — Telephone Encounter (Signed)
Prescription refill request for Eliquis received. Indication: a fib Last office visit: 05/28/22 Scr: 0.79 (05/28/22) Age: 62 Weight: 91 kg

## 2023-02-07 NOTE — Telephone Encounter (Signed)
Please review

## 2023-02-16 DIAGNOSIS — I480 Paroxysmal atrial fibrillation: Secondary | ICD-10-CM | POA: Insufficient documentation

## 2023-02-25 ENCOUNTER — Other Ambulatory Visit: Payer: Self-pay | Admitting: Family

## 2023-03-25 ENCOUNTER — Other Ambulatory Visit: Payer: Self-pay | Admitting: Family Medicine

## 2023-03-25 DIAGNOSIS — R9389 Abnormal findings on diagnostic imaging of other specified body structures: Secondary | ICD-10-CM

## 2023-03-31 ENCOUNTER — Ambulatory Visit
Admission: RE | Admit: 2023-03-31 | Discharge: 2023-03-31 | Disposition: A | Discharge status: Home / Self Care | Payer: Managed Care, Other (non HMO) | Source: Ambulatory Visit | Attending: Family Medicine | Admitting: Family Medicine

## 2023-03-31 DIAGNOSIS — R9389 Abnormal findings on diagnostic imaging of other specified body structures: Secondary | ICD-10-CM

## 2023-03-31 MED ORDER — IOPAMIDOL (ISOVUE-300) INJECTION 61%
75.0000 mL | Freq: Once | INTRAVENOUS | Status: AC | PRN
  Administered 2023-03-31: 75 mL via INTRAVENOUS

## 2023-04-06 ENCOUNTER — Encounter: Payer: Self-pay | Admitting: Oncology

## 2023-04-06 ENCOUNTER — Inpatient Hospital Stay: Payer: Managed Care, Other (non HMO)

## 2023-04-06 ENCOUNTER — Ambulatory Visit
Admission: RE | Admit: 2023-04-06 | Discharge: 2023-04-06 | Disposition: A | Discharge status: Home / Self Care | Payer: Managed Care, Other (non HMO) | Source: Ambulatory Visit | Attending: Student

## 2023-04-06 ENCOUNTER — Inpatient Hospital Stay: Payer: Managed Care, Other (non HMO) | Attending: Oncology | Admitting: Oncology

## 2023-04-06 ENCOUNTER — Telehealth: Payer: Self-pay

## 2023-04-06 ENCOUNTER — Ambulatory Visit
Admission: RE | Admit: 2023-04-06 | Discharge: 2023-04-06 | Disposition: A | Discharge status: Home / Self Care | Payer: Managed Care, Other (non HMO) | Source: Ambulatory Visit | Attending: Oncology | Admitting: Oncology

## 2023-04-06 ENCOUNTER — Other Ambulatory Visit: Payer: Self-pay | Admitting: Student

## 2023-04-06 ENCOUNTER — Encounter: Payer: Self-pay | Admitting: *Deleted

## 2023-04-06 VITALS — BP 111/81 | HR 95 | Temp 98.1°F | Resp 16 | Ht 70.0 in | Wt 177.0 lb

## 2023-04-06 DIAGNOSIS — J90 Pleural effusion, not elsewhere classified: Secondary | ICD-10-CM

## 2023-04-06 DIAGNOSIS — R911 Solitary pulmonary nodule: Secondary | ICD-10-CM | POA: Diagnosis not present

## 2023-04-06 DIAGNOSIS — Z9889 Other specified postprocedural states: Secondary | ICD-10-CM

## 2023-04-06 DIAGNOSIS — F17210 Nicotine dependence, cigarettes, uncomplicated: Secondary | ICD-10-CM | POA: Insufficient documentation

## 2023-04-06 DIAGNOSIS — Z8572 Personal history of non-Hodgkin lymphomas: Secondary | ICD-10-CM | POA: Diagnosis not present

## 2023-04-06 DIAGNOSIS — Z79899 Other long term (current) drug therapy: Secondary | ICD-10-CM | POA: Insufficient documentation

## 2023-04-06 LAB — CBC WITH DIFFERENTIAL/PLATELET
Abs Immature Granulocytes: 0.12 10*3/uL — ABNORMAL HIGH (ref 0.00–0.07)
Basophils Absolute: 0.1 10*3/uL (ref 0.0–0.1)
Basophils Relative: 1 %
Eosinophils Absolute: 0.1 10*3/uL (ref 0.0–0.5)
Eosinophils Relative: 1 %
HCT: 46.1 % (ref 39.0–52.0)
Hemoglobin: 14.5 g/dL (ref 13.0–17.0)
Immature Granulocytes: 1 %
Lymphocytes Relative: 16 %
Lymphs Abs: 1.5 10*3/uL (ref 0.7–4.0)
MCH: 27 pg (ref 26.0–34.0)
MCHC: 31.5 g/dL (ref 30.0–36.0)
MCV: 85.7 fL (ref 80.0–100.0)
Monocytes Absolute: 1.1 10*3/uL — ABNORMAL HIGH (ref 0.1–1.0)
Monocytes Relative: 11 %
Neutro Abs: 6.8 10*3/uL (ref 1.7–7.7)
Neutrophils Relative %: 70 %
Platelets: 329 10*3/uL (ref 150–400)
RBC: 5.38 MIL/uL (ref 4.22–5.81)
RDW: 14.4 % (ref 11.5–15.5)
WBC: 9.7 10*3/uL (ref 4.0–10.5)
nRBC: 0 % (ref 0.0–0.2)

## 2023-04-06 LAB — CMP (CANCER CENTER ONLY)
ALT: 15 U/L (ref 0–44)
AST: 17 U/L (ref 15–41)
Albumin: 3.8 g/dL (ref 3.5–5.0)
Alkaline Phosphatase: 90 U/L (ref 38–126)
Anion gap: 10 (ref 5–15)
BUN: 15 mg/dL (ref 8–23)
CO2: 25 mmol/L (ref 22–32)
Calcium: 9.4 mg/dL (ref 8.9–10.3)
Chloride: 101 mmol/L (ref 98–111)
Creatinine: 0.84 mg/dL (ref 0.61–1.24)
GFR, Estimated: >60 mL/min (ref >60)
Glucose, Bld: 197 mg/dL — ABNORMAL HIGH (ref 70–99)
Potassium: 4.4 mmol/L (ref 3.5–5.1)
Sodium: 136 mmol/L (ref 135–145)
Total Bilirubin: 0.5 mg/dL (ref 0.3–1.2)
Total Protein: 8 g/dL (ref 6.5–8.1)

## 2023-04-06 MED ORDER — LIDOCAINE HCL (PF) 1 % IJ SOLN
10.0000 mL | Freq: Once | INTRAMUSCULAR | Status: AC
  Administered 2023-04-06: 10 mL via INTRADERMAL

## 2023-04-06 MED ORDER — OXYCODONE HCL 5 MG PO TABS: 5.0000 mg | ORAL_TABLET | Freq: Four times a day (QID) | ORAL | 0 refills | Status: DC | PRN

## 2023-04-06 NOTE — Progress Notes (Signed)
[facility]  [phone] [phone]  ID: [name] OB: 12/11/1961  MR#: [id number]  AOZ#:[id number]  Patient Care Team: Glori Luis, MD as PCP - General (Family Medicine) Mariah Milling Tollie Pizza, MD as PCP - Cardiology (Cardiology)  CHIEF COMPLAINT: Right pleural effusion and diffuse left lung nodules consistent with underlying malignancy.  INTERVAL HISTORY: Patient is a 62 year old male with a past medical history significant for a stage IIb diffuse large B-cell lymphoma status post 6 cycles of R-CHOP completing treatment in November 2016.  Over the past 6 months he developed increasing cough and shortness of breath.  He also admits to some unintentional weight loss.  He also complains of left-sided headache/neck pain.  He has no other neurologic complaints.  He denies any recent fevers.  He has a good appetite.  He has no chest pain or hemoptysis.  He denies any nausea, vomiting, constipation, or diarrhea.  He has no urinary complaints.  Patient offers no further specific complaints today.  REVIEW OF SYSTEMS:   Review of Systems  Constitutional:  Positive for malaise/fatigue and weight loss. Negative for fever.  Respiratory:  Positive for cough and shortness of breath. Negative for hemoptysis.   Cardiovascular: Negative.  Negative for chest pain and leg swelling.  Gastrointestinal: Negative.  Negative for abdominal pain.  Genitourinary: Negative.  Negative for dysuria and hematuria.  Musculoskeletal: Negative.  Negative for back pain.  Skin: Negative.  Negative for rash.  Neurological:  Positive for weakness and headaches. Negative for dizziness and seizures.  Psychiatric/Behavioral: Negative.  The patient is not nervous/anxious.     As per HPI. Otherwise, a complete review of systems is negative.  PAST MEDICAL HISTORY: Past Medical History:  Diagnosis Date   Abnormal EKG 11/15/2017   Abnormal stress test    Atrial fibrillation    CAD  in native artery    a. 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 No EKG changes concerning for ischemia at peak stress or in recovery. Mod to high risk scan; b. LHC 12/15/17: pLAD 75% w/ FFR 0.74 s/p PCI/DES, mRCA 90%, CTO dRCA w/ L-R colatts, EF 45-50%, inf HK    Diabetes mellitus    HCV (hepatitis C virus)    HLD (hyperlipidemia)    Hypertension    Large cell lymphoma    a. s/p 6 cycles of R-CHOP   Peripheral neuropathy    Stroke    Systolic dysfunction    a. TTE 8/16: EF > 55%, aortic sclerosis, normal RV systolic function; b. LHC 12/15/17: EF 45-50%, inf HK    PAST SURGICAL HISTORY: Past Surgical History:  Procedure Laterality Date   APPENDECTOMY     CORONARY PRESSURE/FFR STUDY N/A 12/15/2017   Procedure: INTRAVASCULAR PRESSURE WIRE/FFR STUDY;  Surgeon: Iran Ouch, MD;  Location: [facility] INVASIVE CV LAB;  Service: Cardiovascular;  Laterality: N/A;   LEFT HEART CATH AND CORONARY ANGIOGRAPHY N/A 12/15/2017   Procedure: LEFT HEART CATH AND CORONARY ANGIOGRAPHY;  Surgeon: Iran Ouch, MD;  Location: [facility] INVASIVE CV LAB;  Service: Cardiovascular;  Laterality: N/A;   TONSILLECTOMY AND ADENOIDECTOMY      FAMILY HISTORY: Family History  Problem Relation Age of Onset   Hypertension Mother    Hyperlipidemia Mother    Stroke Mother    Diabetes Mother    Kidney disease Father  Hypertension Father    Diabetes Father    Dementia Maternal Grandmother     ADVANCED DIRECTIVES (Y/N):  N  HEALTH MAINTENANCE: Social History   Tobacco Use   Smoking status: Every Day    Packs/day: .5    Types: Cigarettes   Smokeless tobacco: Never   Tobacco comments:    Down to a couple of cigarettes a day  Substance Use Topics   Alcohol use: Yes    Comment: occasional   Drug use: No     Colonoscopy:  PAP:  Bone density:  Lipid panel:  No Known  Allergies  Current Outpatient Medications  Medication Sig Dispense Refill   apixaban (ELIQUIS) 5 MG TABS tablet Take 1 tablet (5 mg total) by mouth 2 (two) times daily. 180 tablet 0   blood glucose meter kit and supplies KIT Dispense based on patient and insurance preference. Use 2 times daily as directed. (FOR ICD-10 E11.9). 1 each 0   Dulaglutide 0.75 MG/0.5ML SOPN Inject 0.75 mg into the skin once a week.     JARDIANCE 25 MG TABS tablet TAKE 1 TABLET BY MOUTH EVERY DAY 90 tablet 2   metoprolol succinate (TOPROL-XL) 50 MG 24 hr tablet Take 1 tablet (50 mg total) by mouth daily. TAKE WITH OR IMMEDIATELY FOLLOWING A MEAL. 90 tablet 3   nitroGLYCERIN (NITROSTAT) 0.4 MG SL tablet Place 1 tablet (0.4 mg total) under the tongue every 5 (five) minutes as needed for chest pain. 25 tablet 3   ONETOUCH VERIO test strip USE TWICE A DAY AS DIRECTED 100 strip 0   rosuvastatin (CRESTOR) 10 MG tablet Take 1 tablet (10 mg total) by mouth daily. 90 tablet 3   diltiazem (CARDIZEM) 30 MG tablet TAKE 1 TABLET (30 MG TOTAL) BY MOUTH 3 (THREE) TIMES DAILY AS NEEDED. (Patient not taking: Reported on 04/06/2023) 270 tablet 0   No current facility-administered medications for this visit.    OBJECTIVE: Vitals:   04/06/23 1017  BP: 111/81  Pulse: 95  Resp: 16  Temp: 98.1 F (36.7 C)  SpO2: 96%     Body mass index is 25.4 kg/m.    ECOG FS:1 - Symptomatic but completely ambulatory  General: Well-developed, well-nourished, no acute distress. Eyes: Pink conjunctiva, anicteric sclera. HEENT: Normocephalic, moist mucous membranes. Lungs: Minimal right-sided breath sounds.   Heart: Regular rate and rhythm. Abdomen: Soft, nontender, no obvious distention. Musculoskeletal: No edema, cyanosis, or clubbing. Neuro: Alert, answering all questions appropriately. Cranial nerves grossly intact. Skin: No rashes or petechiae noted. Psych: Normal affect. Lymphatics: No cervical, calvicular, axillary or inguinal  LAD.   LAB RESULTS:  Lab Results  Component Value Date   NA 136 04/06/2023   K 4.4 04/06/2023   CL 101 04/06/2023   CO2 25 04/06/2023   GLUCOSE 197 (H) 04/06/2023   BUN 15 04/06/2023   CREATININE 0.84 04/06/2023   CALCIUM 9.4 04/06/2023   PROT 8.0 04/06/2023   ALBUMIN 3.8 04/06/2023   AST 17 04/06/2023   ALT 15 04/06/2023   ALKPHOS 90 04/06/2023   BILITOT 0.5 04/06/2023   GFRNONAA >60 04/06/2023   GFRAA 87 12/11/2020    Lab Results  Component Value Date   WBC 9.7 04/06/2023   NEUTROABS 6.8 04/06/2023   HGB 14.5 04/06/2023   HCT 46.1 04/06/2023   MCV 85.7 04/06/2023   PLT 329 04/06/2023     STUDIES: CT CHEST W CONTRAST  Result Date: 04/02/2023 CLINICAL DATA:  Pneumonia, weight loss, lymphoma. EXAM: CT CHEST WITH  CONTRAST TECHNIQUE: Multidetector CT imaging of the chest was performed during intravenous contrast administration. RADIATION DOSE REDUCTION: This exam was performed according to the departmental dose-optimization program which includes automated exposure control, adjustment of the mA and/or kV according to patient size and/or use of iterative reconstruction technique. CONTRAST:  75mL ISOVUE-300 IOPAMIDOL (ISOVUE-300) INJECTION 61% COMPARISON:  None Available. FINDINGS: Cardiovascular: No pericardial effusion or cardiomegaly. Atheromatous calcifications of the coronary arteries. Mediastinum/Nodes: Right paratracheal node measures 1.3 cm. Mediastinal structures are otherwise unremarkable. Lungs/Pleura: Large right-sided pleural effusion with right lung atelectasis. Small pleural effusion on the left. Diffuse nodular interstitial process on the left consistent with either pulmonary metastatic disease or lymphoma. Upper Abdomen: Left adrenal nodule identified with intermediate attenuation measuring 2.5 cm. A neoplastic etiology cannot be excluded. This could be further evaluated with a noncontrast CT or MRI. Musculoskeletal: Thoracic degenerative changes. No osteolytic or  osteoblastic lesions identified. IMPRESSION: 1. Large right-sided pleural effusion with right lung atelectasis. 2. Small left-sided pleural effusion. 3. Extensive diffuse nodules and interstitial changes in the left lung. This is consistent with a neoplastic etiology, likely lymphoma given the patient's history. 4. Indeterminate left adrenal nodule. Electronically Signed   By: Layla Maw M.D.   On: 04/02/2023 21:02    ASSESSMENT: Right pleural effusion and diffuse left lung nodules consistent with underlying malignancy.  PLAN:    Right pleural effusion and diffuse left lung nodules: Highly suspicious for underlying malignancy.  Given patient's extensive smoking history suspect a second primary and not a recurrence of his lymphoma.  Will get thoracentesis for symptomatic relief as well as possible diagnosis.  If pathology is inconclusive, will send a referral to pulmonary.  Will also get PET scan and MRI brain for staging purposes.  Return to clinic in approximately 2 weeks to discuss his imaging and pathology results as well as treatment planning if necessary. Pleural effusion: Thoracentesis as above. Headache/neck pain: MRI of the brain as above.  Patient was also given a prescription for low-dose oxycodone. History of diffuse large B-cell lymphoma: Patient completed cycle 6 of R-CHOP in November 2016 at [facility].  By report, PET scan in December 2016 revealed no evidence of disease.  I spent a total of 60 minutes reviewing chart data, face-to-face evaluation with the patient, counseling and coordination of care as detailed above.   Patient expressed understanding and was in agreement with this plan. He also understands that He can call clinic at any time with any questions, concerns, or complaints.    Cancer Staging  No matching staging information was found for the patient.  Jeralyn Ruths, MD   04/06/2023 12:00 PM

## 2023-04-06 NOTE — Progress Notes (Signed)
Met with patient after initial consult with Dr. Orlie Dakin. All questions answered during visit. Reviewed upcoming appts. Contact info given and instructed to call with any questions or needs. Pt verbalized understanding. Nothing further needed at this time.

## 2023-04-06 NOTE — Progress Notes (Signed)
Noted the last few weeks has had a consistent headache. States if feels like a crick in the left side of neck. Ears feel stopped up as well. Has had a cough that has been on going for months. PCP did xray and CT scan. That's when lung nodule was found as well as pleural effusion. Does have SOB very easily. 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

## 2023-04-06 NOTE — Telephone Encounter (Signed)
Form faxed over to [facility] to get thoracentesis scheduled for patient for pleural effusion.

## 2023-04-06 NOTE — Procedures (Signed)
PROCEDURE SUMMARY:  Successful US guided right thoracentesis. Yielded 1.5 L of amber/blood-tinged fluid. Pt tolerated procedure well. No immediate complications.  Specimen sent for labs. CXR ordered; no post-procedure pneumothorax identified.   EBL < 2 mL  Mickie Kay, NP 04/06/2023 3:48 PM

## 2023-04-08 ENCOUNTER — Encounter: Payer: Self-pay | Admitting: *Deleted

## 2023-04-08 DIAGNOSIS — J90 Pleural effusion, not elsewhere classified: Secondary | ICD-10-CM

## 2023-04-08 LAB — CYTOLOGY - NON PAP

## 2023-04-08 NOTE — Progress Notes (Signed)
Pt left message asking if can be scheduled for another thoracentesis. Pt states that he does not feel enough fluid was drained off from previous thoracentesis. Per Dr. Orlie Dakin, okay to schedule pt for another thoracentesis next week. Orders placed. Pt will be called with appt once scheduled.

## 2023-04-11 ENCOUNTER — Ambulatory Visit
Admission: RE | Admit: 2023-04-11 | Discharge: 2023-04-11 | Disposition: A | Discharge status: Home / Self Care | Payer: Managed Care, Other (non HMO) | Source: Ambulatory Visit | Attending: Oncology | Admitting: Oncology

## 2023-04-11 ENCOUNTER — Other Ambulatory Visit: Payer: Self-pay | Admitting: Radiology

## 2023-04-11 ENCOUNTER — Ambulatory Visit
Admission: RE | Admit: 2023-04-11 | Discharge: 2023-04-11 | Disposition: A | Discharge status: Home / Self Care | Payer: Managed Care, Other (non HMO) | Source: Ambulatory Visit | Attending: Radiology | Admitting: Radiology

## 2023-04-11 DIAGNOSIS — J90 Pleural effusion, not elsewhere classified: Secondary | ICD-10-CM | POA: Diagnosis present

## 2023-04-11 DIAGNOSIS — C8330 Diffuse large B-cell lymphoma, unspecified site: Secondary | ICD-10-CM | POA: Insufficient documentation

## 2023-04-11 DIAGNOSIS — Z9889 Other specified postprocedural states: Secondary | ICD-10-CM

## 2023-04-11 MED ORDER — LIDOCAINE HCL (PF) 1 % IJ SOLN
10.0000 mL | Freq: Once | INTRAMUSCULAR | Status: AC
  Administered 2023-04-11: 10 mL via INTRADERMAL
  Filled 2023-04-11: qty 10

## 2023-04-11 NOTE — Procedures (Signed)
Ultrasound-guided diagnostic and therapeutic right sided thoracentesis performed yielding 1 liters of amber colored fluid. No immediate complications.   Diagnostic fluid was sent to the lab for further analysis. Follow-up chest x-ray pending. EBL is < 2 ml.

## 2023-04-12 ENCOUNTER — Other Ambulatory Visit: Payer: Managed Care, Other (non HMO)

## 2023-04-12 ENCOUNTER — Encounter: Payer: Self-pay | Admitting: *Deleted

## 2023-04-12 ENCOUNTER — Other Ambulatory Visit: Payer: Self-pay | Admitting: Cardiovascular Disease

## 2023-04-12 ENCOUNTER — Other Ambulatory Visit: Payer: Self-pay | Admitting: *Deleted

## 2023-04-12 DIAGNOSIS — I480 Paroxysmal atrial fibrillation: Secondary | ICD-10-CM

## 2023-04-12 DIAGNOSIS — J90 Pleural effusion, not elsewhere classified: Secondary | ICD-10-CM

## 2023-04-12 MED ORDER — OXYCODONE HCL 5 MG PO TABS: 5.0000 mg | ORAL_TABLET | Freq: Four times a day (QID) | ORAL | 0 refills | Status: DC | PRN

## 2023-04-12 NOTE — Progress Notes (Signed)
Pt requesting to be tentatively scheduled for another thoracentesis next week in case there is more pleural remaining. States that he has felt slight improvement in his symptoms but is concerned about any remaining fluid. Per Dr. Orlie Dakin, okay to proceed with scheduling and to order pleural fluid for cytology. Orders placed and pt will be called with appt once scheduled.

## 2023-04-12 NOTE — Telephone Encounter (Signed)
Pt is unable to get oxycodone filled at [facility] on [address] due to it being out of stock. [facility] [address] to [facility].   [facility] [address] and new prescription to be [facility] [address].

## 2023-04-12 NOTE — Telephone Encounter (Signed)
Pt last saw Dr Mariah Milling 05/28/22, last labs 04/06/23 Creat 0.84, age 62, weight 80.3kg, based on specified criteria pt is on appropriate dosage of Eliquis  BID for afib.  Will refill rx.

## 2023-04-12 NOTE — Telephone Encounter (Signed)
Please review refill request 

## 2023-04-13 ENCOUNTER — Encounter: Payer: Self-pay | Admitting: Gastroenterology

## 2023-04-13 ENCOUNTER — Encounter: Payer: Self-pay | Admitting: Radiology

## 2023-04-13 LAB — CYTOLOGY - NON PAP

## 2023-04-18 ENCOUNTER — Ambulatory Visit
Admission: RE | Admit: 2023-04-18 | Discharge: 2023-04-18 | Disposition: A | Discharge status: Home / Self Care | Payer: Managed Care, Other (non HMO) | Source: Ambulatory Visit | Attending: Oncology | Admitting: Oncology

## 2023-04-18 DIAGNOSIS — R911 Solitary pulmonary nodule: Secondary | ICD-10-CM

## 2023-04-18 MED ORDER — GADOPICLENOL 0.5 MMOL/ML IV SOLN
8.5000 mL | Freq: Once | INTRAVENOUS | Status: AC | PRN
  Administered 2023-04-18: 8.5 mL via INTRAVENOUS

## 2023-04-19 ENCOUNTER — Ambulatory Visit
Admission: RE | Admit: 2023-04-19 | Discharge: 2023-04-19 | Disposition: A | Discharge status: Home / Self Care | Payer: Managed Care, Other (non HMO) | Source: Ambulatory Visit | Attending: Oncology | Admitting: Oncology

## 2023-04-19 VITALS — Wt 172.2 lb

## 2023-04-19 DIAGNOSIS — J90 Pleural effusion, not elsewhere classified: Secondary | ICD-10-CM | POA: Insufficient documentation

## 2023-04-19 DIAGNOSIS — I700 Atherosclerosis of aorta: Secondary | ICD-10-CM | POA: Diagnosis not present

## 2023-04-19 DIAGNOSIS — R590 Localized enlarged lymph nodes: Secondary | ICD-10-CM | POA: Diagnosis not present

## 2023-04-19 DIAGNOSIS — R918 Other nonspecific abnormal finding of lung field: Secondary | ICD-10-CM | POA: Diagnosis not present

## 2023-04-19 DIAGNOSIS — I2510 Atherosclerotic heart disease of native coronary artery without angina pectoris: Secondary | ICD-10-CM | POA: Diagnosis not present

## 2023-04-19 DIAGNOSIS — Z8572 Personal history of non-Hodgkin lymphomas: Secondary | ICD-10-CM | POA: Insufficient documentation

## 2023-04-19 DIAGNOSIS — R911 Solitary pulmonary nodule: Secondary | ICD-10-CM | POA: Diagnosis present

## 2023-04-19 DIAGNOSIS — C7951 Secondary malignant neoplasm of bone: Secondary | ICD-10-CM | POA: Diagnosis not present

## 2023-04-19 LAB — GLUCOSE, CAPILLARY: Glucose-Capillary: 113 mg/dL — ABNORMAL HIGH (ref 70–99)

## 2023-04-19 MED ORDER — FLUDEOXYGLUCOSE F - 18 (FDG) INJECTION
9.2000 | Freq: Once | INTRAVENOUS | Status: AC | PRN
  Administered 2023-04-19: 9.22 via INTRAVENOUS

## 2023-04-20 ENCOUNTER — Other Ambulatory Visit: Payer: Self-pay | Admitting: Internal Medicine

## 2023-04-20 ENCOUNTER — Ambulatory Visit
Admission: RE | Admit: 2023-04-20 | Discharge: 2023-04-20 | Disposition: A | Discharge status: Home / Self Care | Payer: Managed Care, Other (non HMO) | Source: Ambulatory Visit | Attending: Internal Medicine

## 2023-04-20 ENCOUNTER — Ambulatory Visit
Admission: RE | Admit: 2023-04-20 | Discharge: 2023-04-20 | Disposition: A | Discharge status: Home / Self Care | Payer: Managed Care, Other (non HMO) | Source: Ambulatory Visit | Attending: Oncology | Admitting: Oncology

## 2023-04-20 DIAGNOSIS — Z9889 Other specified postprocedural states: Secondary | ICD-10-CM

## 2023-04-20 DIAGNOSIS — Z8572 Personal history of non-Hodgkin lymphomas: Secondary | ICD-10-CM | POA: Diagnosis not present

## 2023-04-20 DIAGNOSIS — J90 Pleural effusion, not elsewhere classified: Secondary | ICD-10-CM | POA: Diagnosis present

## 2023-04-20 DIAGNOSIS — C7800 Secondary malignant neoplasm of unspecified lung: Secondary | ICD-10-CM | POA: Insufficient documentation

## 2023-04-20 MED ORDER — LIDOCAINE HCL (PF) 1 % IJ SOLN
10.0000 mL | Freq: Once | INTRAMUSCULAR | Status: AC
  Administered 2023-04-20: 10 mL via INTRADERMAL
  Filled 2023-04-20: qty 10

## 2023-04-20 NOTE — Procedures (Signed)
PROCEDURE SUMMARY:  Successful US guided therapeutic right thoracentesis. Yielded 1.2 L of hazy, amber fluid. Pt tolerated procedure well. No immediate complications.  Specimen was sent for labs. CXR ordered.  EBL < 1 mL  Shon Hough, AGNP 04/20/2023 4:17 PM

## 2023-04-21 ENCOUNTER — Inpatient Hospital Stay: Payer: Managed Care, Other (non HMO) | Admitting: Oncology

## 2023-04-21 ENCOUNTER — Other Ambulatory Visit: Payer: Self-pay | Admitting: *Deleted

## 2023-04-21 ENCOUNTER — Telehealth: Payer: Self-pay | Admitting: Cardiovascular Disease

## 2023-04-21 ENCOUNTER — Encounter: Payer: Self-pay | Admitting: Oncology

## 2023-04-21 ENCOUNTER — Encounter: Payer: Self-pay | Admitting: *Deleted

## 2023-04-21 VITALS — BP 105/83 | HR 101 | Temp 98.2°F | Resp 14 | Ht 70.0 in | Wt 169.0 lb

## 2023-04-21 DIAGNOSIS — E785 Hyperlipidemia, unspecified: Secondary | ICD-10-CM | POA: Insufficient documentation

## 2023-04-21 DIAGNOSIS — E1142 Type 2 diabetes mellitus with diabetic polyneuropathy: Secondary | ICD-10-CM | POA: Insufficient documentation

## 2023-04-21 DIAGNOSIS — Z5111 Encounter for antineoplastic chemotherapy: Secondary | ICD-10-CM | POA: Insufficient documentation

## 2023-04-21 DIAGNOSIS — C3491 Malignant neoplasm of unspecified part of right bronchus or lung: Secondary | ICD-10-CM | POA: Insufficient documentation

## 2023-04-21 DIAGNOSIS — Z7901 Long term (current) use of anticoagulants: Secondary | ICD-10-CM | POA: Insufficient documentation

## 2023-04-21 DIAGNOSIS — R591 Generalized enlarged lymph nodes: Secondary | ICD-10-CM

## 2023-04-21 DIAGNOSIS — I4891 Unspecified atrial fibrillation: Secondary | ICD-10-CM | POA: Insufficient documentation

## 2023-04-21 DIAGNOSIS — E871 Hypo-osmolality and hyponatremia: Secondary | ICD-10-CM | POA: Insufficient documentation

## 2023-04-21 DIAGNOSIS — I10 Essential (primary) hypertension: Secondary | ICD-10-CM | POA: Insufficient documentation

## 2023-04-21 DIAGNOSIS — M25552 Pain in left hip: Secondary | ICD-10-CM

## 2023-04-21 DIAGNOSIS — C7951 Secondary malignant neoplasm of bone: Secondary | ICD-10-CM | POA: Insufficient documentation

## 2023-04-21 DIAGNOSIS — J90 Pleural effusion, not elsewhere classified: Secondary | ICD-10-CM | POA: Diagnosis not present

## 2023-04-21 DIAGNOSIS — D72819 Decreased white blood cell count, unspecified: Secondary | ICD-10-CM | POA: Insufficient documentation

## 2023-04-21 DIAGNOSIS — Z79899 Other long term (current) drug therapy: Secondary | ICD-10-CM | POA: Insufficient documentation

## 2023-04-21 DIAGNOSIS — J910 Malignant pleural effusion: Secondary | ICD-10-CM | POA: Insufficient documentation

## 2023-04-21 DIAGNOSIS — R911 Solitary pulmonary nodule: Secondary | ICD-10-CM

## 2023-04-21 MED ORDER — FENTANYL 25 MCG/HR TD PT72: 1.0000 | MEDICATED_PATCH | TRANSDERMAL | 0 refills | Status: DC

## 2023-04-21 NOTE — Progress Notes (Signed)
Left hip pain. Has gotten worse over the last 2 weeks. Can not put weight on it and now has to walk with a walker at home. Having headaches that are still pretty consistent. Oxycodone does help for a few hours but the pain does come back. Would like something longer lasting for pain.

## 2023-04-21 NOTE — Telephone Encounter (Signed)
   Pre-operative Risk Assessment    Patient Name: RONDAL VANDEVELDE  DOB: 1961/08/03 MRN: [id number]      Request for Surgical Clearance    Procedure:  Lymph node biopsy  Date of Surgery:  Clearance TBD                                 Surgeon:  not listed  Surgeon's Group or Practice Name:  [facility] Phone number:  [phone] Fax number:  [phone]   Type of Clearance Requested: Pharmacy, hold Eliquis 2days    Type of Anesthesia:  Not Indicated   Additional requests/questions:    Signed, Narda Amber   04/21/2023, 3:52 PM

## 2023-04-21 NOTE — Progress Notes (Signed)
[facility]  [phone] [phone]  ID: Charles Hickman OB: 02-Nov-1961  MR#: [id number]  AOZ#:[id number]  Patient Care Team: Dorothey Baseman, MD as PCP - General (Family Medicine) Mariah Milling Tollie Pizza, MD as PCP - Cardiology (Cardiology) Glory Buff, RN as Oncology Nurse Navigator  CHIEF COMPLAINT: Right pleural effusion and diffuse left lung nodules consistent with underlying malignancy.  INTERVAL HISTORY: Patient returns to clinic today for further evaluation and discussion of his imaging results.  He is having increased pain at his left skull base as well as his left hip.  He continues to have shortness of breath and has had thoracentesis x 3 over the past several weeks.  He has a poor appetite.  He has no other neurologic complaints.  He denies any recent fevers.  He denies any chest pain, cough, or hemoptysis.  He denies any nausea, vomiting, constipation, or diarrhea.  He has no urinary complaints.  Patient offers no further specific complaints today.  REVIEW OF SYSTEMS:   Review of Systems  Constitutional:  Positive for malaise/fatigue and weight loss. Negative for fever.  Respiratory:  Positive for shortness of breath. Negative for cough and hemoptysis.   Cardiovascular: Negative.  Negative for chest pain and leg swelling.  Gastrointestinal: Negative.  Negative for abdominal pain.  Genitourinary: Negative.  Negative for dysuria and hematuria.  Musculoskeletal:  Positive for joint pain. Negative for back pain.  Skin: Negative.  Negative for rash.  Neurological:  Positive for weakness and headaches. Negative for dizziness and seizures.  Psychiatric/Behavioral: Negative.  The patient is not nervous/anxious.     As per HPI. Otherwise, a complete review of systems is negative.  PAST MEDICAL HISTORY: Past Medical History:  Diagnosis Date   Abnormal EKG 11/15/2017   Abnormal stress test    Atrial fibrillation (HCC)    CAD in native artery     a. 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 No EKG changes concerning for ischemia at peak stress or in recovery. Mod to high risk scan; b. LHC 12/15/17: pLAD 75% w/ FFR 0.74 s/p PCI/DES, mRCA 90%, CTO dRCA w/ L-R colatts, EF 45-50%, inf HK    Diabetes mellitus (HCC)    HCV (hepatitis C virus)    HLD (hyperlipidemia)    Hypertension    Large cell lymphoma (HCC)    a. s/p 6 cycles of R-CHOP   Peripheral neuropathy    Stroke [facility])    Systolic dysfunction    a. TTE 8/16: EF > 55%, aortic sclerosis, normal RV systolic function; b. LHC 12/15/17: EF 45-50%, inf HK    PAST SURGICAL HISTORY: Past Surgical History:  Procedure Laterality Date   APPENDECTOMY     CORONARY PRESSURE/FFR STUDY N/A 12/15/2017   Procedure: INTRAVASCULAR PRESSURE WIRE/FFR STUDY;  Surgeon: Iran Ouch, MD;  Location: [facility] INVASIVE CV LAB;  Service: Cardiovascular;  Laterality: N/A;   LEFT HEART CATH AND CORONARY ANGIOGRAPHY N/A 12/15/2017   Procedure: LEFT HEART CATH AND CORONARY ANGIOGRAPHY;  Surgeon: Iran Ouch, MD;  Location: [facility] INVASIVE CV LAB;  Service: Cardiovascular;  Laterality: N/A;   TONSILLECTOMY AND ADENOIDECTOMY      FAMILY HISTORY: Family History  Problem Relation Age of Onset   Hypertension Mother    Hyperlipidemia Mother    Stroke Mother    Diabetes Mother  Kidney disease Father    Hypertension Father    Diabetes Father    Dementia Maternal Grandmother     ADVANCED DIRECTIVES (Y/N):  N  HEALTH MAINTENANCE: Social History   Tobacco Use   Smoking status: Every Day    Packs/day: .5    Types: Cigarettes   Smokeless tobacco: Never   Tobacco comments:    Down to a couple of cigarettes a day  Substance Use Topics   Alcohol use: Yes    Comment: occasional   Drug use: No     Colonoscopy:  PAP:  Bone density:  Lipid panel:  No Known  Allergies  Current Outpatient Medications  Medication Sig Dispense Refill   apixaban (ELIQUIS) 5 MG TABS tablet TAKE 1 TABLET BY MOUTH TWICE A DAY 180 tablet 1   blood glucose meter kit and supplies KIT Dispense based on patient and insurance preference. Use 2 times daily as directed. (FOR ICD-10 E11.9). 1 each 0   Dulaglutide 0.75 MG/0.5ML SOPN Inject 0.75 mg into the skin once a week.     JARDIANCE 25 MG TABS tablet TAKE 1 TABLET BY MOUTH EVERY DAY 90 tablet 2   metoprolol succinate (TOPROL-XL) 50 MG 24 hr tablet Take 1 tablet (50 mg total) by mouth daily. TAKE WITH OR IMMEDIATELY FOLLOWING A MEAL. 90 tablet 3   nitroGLYCERIN (NITROSTAT) 0.4 MG SL tablet Place 1 tablet (0.4 mg total) under the tongue every 5 (five) minutes as needed for chest pain. 25 tablet 3   ONETOUCH VERIO test strip USE TWICE A DAY AS DIRECTED 100 strip 0   oxyCODONE (OXY IR/ROXICODONE) 5 MG immediate release tablet Take 1 tablet (5 mg total) by mouth every 6 (six) hours as needed for severe pain. 60 tablet 0   rosuvastatin (CRESTOR) 10 MG tablet Take 1 tablet (10 mg total) by mouth daily. 90 tablet 3   diltiazem (CARDIZEM) 30 MG tablet TAKE 1 TABLET (30 MG TOTAL) BY MOUTH 3 (THREE) TIMES DAILY AS NEEDED. (Patient not taking: Reported on 04/06/2023) 270 tablet 0   fentaNYL (DURAGESIC) 25 MCG/HR Place 1 patch onto the skin every 3 (three) days. 10 patch 0   No current facility-administered medications for this visit.    OBJECTIVE: Vitals:   04/21/23 1346  BP: 105/83  Pulse: (!) 101  Resp: 14  Temp: 98.2 F (36.8 C)  SpO2: 96%     Body mass index is 24.25 kg/m.    ECOG FS:1 - Symptomatic but completely ambulatory  General: Well-developed, well-nourished, no acute distress.  Sitting in a wheelchair. Eyes: Pink conjunctiva, anicteric sclera. HEENT: Normocephalic, moist mucous membranes. Lungs: No audible wheezing or coughing. Heart: Regular rate and rhythm. Abdomen: Soft, nontender, no obvious  distention. Musculoskeletal: No edema, cyanosis, or clubbing. Neuro: Alert, answering all questions appropriately. Cranial nerves grossly intact. Skin: No rashes or petechiae noted. Psych: Normal affect.  LAB RESULTS:  Lab Results  Component Value Date   NA 136 04/06/2023   K 4.4 04/06/2023   CL 101 04/06/2023   CO2 25 04/06/2023   GLUCOSE 197 (H) 04/06/2023   BUN 15 04/06/2023   CREATININE 0.84 04/06/2023   CALCIUM 9.4 04/06/2023   PROT 8.0 04/06/2023   ALBUMIN 3.8 04/06/2023   AST 17 04/06/2023   ALT 15 04/06/2023   ALKPHOS 90 04/06/2023   BILITOT 0.5 04/06/2023   GFRNONAA >60 04/06/2023   GFRAA 87 12/11/2020    Lab Results  Component Value Date   WBC 9.7 04/06/2023  NEUTROABS 6.8 04/06/2023   HGB 14.5 04/06/2023   HCT 46.1 04/06/2023   MCV 85.7 04/06/2023   PLT 329 04/06/2023     STUDIES: NM PET Image Initial (PI) Skull Base To Thigh  Result Date: 04/20/2023 CLINICAL DATA:  Initial treatment strategy for pulmonary nodules. Prior history of lymphoma. EXAM: NUCLEAR MEDICINE PET SKULL BASE TO THIGH TECHNIQUE: 9.22 mCi F-18 FDG was injected intravenously. Full-ring PET imaging was performed from the skull base to thigh after the radiotracer. CT data was obtained and used for attenuation correction and anatomic localization. Fasting blood glucose: 113 mg/dl COMPARISON:  Chest CT 54/08/8118 FINDINGS: Mediastinal blood pool activity: SUV max 2.43 Liver activity: SUV max 2.70 NECK: No neck mass or cervical lymphadenopathy. Incidental CT findings: None. CHEST: Small lymph node noted in the left supraclavicular fossa with SUV max of 2.3. There are also small supraclavicular and subclavicular lymph nodes on the left side which are hypermetabolic. SUV max is 2.86 The right lung is completely obstructed/drowned with a abrupt cut off of the right mainstem bronchus. There are numerous areas of hypermetabolism throughout the obstructed right lung. Right upper lobe hypermetabolism near  the bronchial cut has an SUV max of 6.59. Findings consistent with a probable primary lung neoplasm and diffuse pulmonary metastatic disease. There is also a large right pleural effusion and multiple hypermetabolic pleural lesions. The recent thoracentesis may yield a pathologic diagnosis. There is also mediastinal lymphadenopathy which is hypermetabolic. The largest upper right mediastinal disease measures a maximum of 23 mm and the SUV max is 5.70. Diffuse pulmonary nodules throughout the aerated left lung. The larger nodules demonstrate hypermetabolism in her consistent with metastatic disease. Incidental CT findings: The heart is normal in size. No pericardial effusion. There are age advanced aortic and three-vessel coronary artery calcifications. ABDOMEN/PELVIS: No findings to suggest hepatic metastatic disease. There is a 2.2 cm left adrenal gland lesion demonstrating mild hypermetabolism with SUV max of 3.76 which is likely a metastatic focus. No enlarged or hypermetabolic mesenteric or retroperitoneal nodes to suggest recurrent lymphoma or metastatic adenopathy. No pelvic adenopathy. Incidental CT findings: Significant age advanced atherosclerotic calcification involving the aorta and branch vessels but no aneurysm. SKELETON: Diffuse osseous metastatic disease. There is a large destructive lesion involving the left occipital bone at the skull base with SUV max of 6.53. Left-sided sternal lesion has an SUV max of 5.87. Left fifth anterior rib has an SUV max of 6.32. Multiple vertebral body lesions without spinal canal compromise. T9 lesion has an SUV max of 4.89. L1 lesion has an SUV max of 6.56. Large destructive central sacral lesion has an SUV max of 5.50. Large destructive left hip lesion has an SUV max of 4.75. Findings highly worrisome for impending fracture. Right hip lesion at the level of the lesser trochanter has an SUV max of 5.22. Incidental CT findings: None. IMPRESSION: 1. Obstructed/drowned  right lung with numerous areas of hypermetabolism likely due to a primary lung neoplasm. 2. Large right pleural effusion with multiple hypermetabolic pleural lesions. The recent thoracentesis may yield a pathologic diagnosis. If not, biopsy of a left supraclavicular node may be the easiest and safest tissue diagnosis. 3. Diffuse pulmonary metastatic disease. 4. Hypermetabolic supraclavicular and mediastinal lymphadenopathy. 5. Left adrenal gland lesion is likely a metastatic lesion. 6. Diffuse osseous metastatic disease. The left hip lesion is highly worrisome for impending fracture. Large destructive lesion at the left skull base. Vertebral body lesions but no spinal canal compromise. 7. Age advanced atherosclerotic calcification involving  the aorta and three-vessel coronary artery calcifications. Aortic Atherosclerosis (ICD10-I70.0). Electronically Signed   By: Rudie Meyer M.D.   On: 04/20/2023 16:38   US THORACENTESIS ASP PLEURAL SPACE W/IMG GUIDE  Result Date: 04/20/2023 INDICATION: History of lymphoma found to have right-sided recurrent pleural effusion. Request received for diagnostic and therapeutic right thoracentesis. EXAM: ULTRASOUND GUIDED DIAGNOSTIC AND THERAPEUTIC RIGHT THORACENTESIS MEDICATIONS: 10 mL 1 % lidocaine COMPLICATIONS: None immediate. PROCEDURE: An ultrasound guided thoracentesis was thoroughly discussed with the patient and questions answered. The benefits, risks, alternatives and complications were also discussed. The patient understands and wishes to proceed with the procedure. Written consent was obtained. Ultrasound was performed to localize and mark an adequate pocket of fluid in the right chest. The area was then prepped and draped in the normal sterile fashion. 1% Lidocaine was used for local anesthesia. Under ultrasound guidance a 6 Fr Safe-T-Centesis catheter was introduced. Thoracentesis was performed. The catheter was removed and a dressing applied. FINDINGS: A total of  approximately 1.2 L of hazy, amber fluid was removed. Samples were sent to the laboratory as requested by the clinical team. IMPRESSION: Successful ultrasound guided right thoracentesis yielding 1.2 L of pleural fluid. Read by: Alex Gardener, AGNP-BC Electronically Signed   By: Olive Bass M.D.   On: 04/20/2023 16:16   DG Chest Port 1 View  Result Date: 04/20/2023 CLINICAL DATA:  Right pleural effusion.  Status post thoracentesis. EXAM: PORTABLE CHEST 1 VIEW COMPARISON:  Chest x-ray 04/11/2023 and recent PET-CT 04/19/2023. FINDINGS: Persistent complete opacification of the right hemithorax with a bronchial cut off sign suggesting diffuse drowned/obstructed right lung. I do not see any obvious hypermetabolic central lung mass on the PET-CT. Recommend bronchoscopic evaluation. The left lung demonstrates diffuse interstitial and airspace process and innumerable pulmonary nodules. IMPRESSION: Persistent complete opacification of the right hemithorax despite the thoracentesis with a bronchial cut off sign suggesting diffuse drowned/obstructed right lung. Recommend bronchoscopic evaluation. Electronically Signed   By: Rudie Meyer M.D.   On: 04/20/2023 16:13   US THORACENTESIS ASP PLEURAL SPACE W/IMG GUIDE  Result Date: 04/11/2023 INDICATION: Patient with history of lymphoma. Found to have right-sided pleural effusion with diffuse left lobe lung nodules consistent with underlying malignancy yet to be determined. Patient presents for therapeutic and diagnostic thoracentesis EXAM: ULTRASOUND GUIDED THERAPEUTIC AND DIAGNOSTIC THORACENTESIS MEDICATIONS: Lidocaine 1% 10 mL COMPLICATIONS: None immediate. PROCEDURE: An ultrasound guided thoracentesis was thoroughly discussed with the patient and questions answered. The benefits, risks, alternatives and complications were also discussed. The patient understands and wishes to proceed with the procedure. Written consent was obtained. Ultrasound was performed to localize  and mark an adequate pocket of fluid in the right chest. The area was then prepped and draped in the normal sterile fashion. 1% Lidocaine was used for local anesthesia. Under ultrasound guidance a 6 Fr Safe-T-Centesis catheter was introduced. Thoracentesis was performed. The catheter was removed and a dressing applied. FINDINGS: A total of approximately 1 L of amber color fluid was removed. Samples were sent to the laboratory as requested by the clinical team. IMPRESSION: Successful ultrasound guided right-sided therapeutic and diagnostic thoracentesis yielding 1 L of pleural fluid. Read by: Anders Grant, NP Electronically Signed   By: Malachy Moan M.D.   On: 04/11/2023 16:10   DG Chest Port 1 View  Result Date: 04/11/2023 CLINICAL DATA:  Right pleural effusion status post thoracentesis. 1 L of fluid drained from right pleural space earlier today. EXAM: PORTABLE CHEST 1 VIEW COMPARISON:  AP chest 04/06/2023,  chest two views 11/15/2017; CT chest 03/31/2023 FINDINGS: There is again complete homogeneous opacification of the right hemithorax. Mild rightward mediastinal shift is similar to 04/06/2023 and 03/31/2023 CT, likely due to the compressive atelectasis associated with a large right pleural effusion. Diffuse left lung moderate interstitial thickening and reticulonodular opacities are similar to prior. No definite left pleural effusion, noting no pleural effusion was seen on 04/06/2023 radiographs but a small pleural effusion was seen on 03/31/2023 CT. No pneumothorax. Visualized cardiac silhouette and mediastinal contours are unremarkable. Mild-to-moderate atherosclerotic calcification within the aortic arch. No acute skeletal abnormality. IMPRESSION: 1. Complete homogeneous opacification of the right hemithorax, similar to 04/06/2023 and 03/31/2023 CT, likely due to the compressive atelectasis associated with persistent large right pleural effusion. Note is made of right-sided thoracentesis earlier  today, and the persistent right hemithorax homogeneous opacification is similar to that seen on 04/06/2023 radiograph following thoracentesis earlier that day as well. 2. Diffuse left lung moderate interstitial thickening and reticulonodular opacities, similar to prior. Appearance on prior CT was suggestive of possible neoplastic etiology including lymphoma given patient history of lymphoma. Electronically Signed   By: Neita Garnet M.D.   On: 04/11/2023 15:45   US THORACENTESIS ASP PLEURAL SPACE W/IMG GUIDE  Result Date: 04/06/2023 INDICATION: Patient with a history of right pleural effusion and diffuse left lung nodules consistent with underlying malignancy. Patient also has a history of lymphoma. Interventional radiology asked to perform a diagnostic and therapeutic thoracentesis. EXAM: ULTRASOUND GUIDED THORACENTESIS MEDICATIONS: 1% lidocaine 10 mL COMPLICATIONS: None immediate. PROCEDURE: An ultrasound guided thoracentesis was thoroughly discussed with the patient and questions answered. The benefits, risks, alternatives and complications were also discussed. The patient understands and wishes to proceed with the procedure. Written consent was obtained. Ultrasound was performed to localize and mark an adequate pocket of fluid in the right chest. The area was then prepped and draped in the normal sterile fashion. 1% Lidocaine was used for local anesthesia. Under ultrasound guidance a 6 Fr Safe-T-Centesis catheter was introduced. Thoracentesis was performed. The catheter was removed and a dressing applied. FINDINGS: A total of approximately 1.5 L of amber/blood-tinged fluid was removed. Samples were sent to the laboratory as requested by the clinical team. IMPRESSION: Successful ultrasound guided right thoracentesis yielding 1.5 L of pleural fluid. Read by: Alwyn Ren, NP Electronically Signed   By: Olive Bass M.D.   On: 04/06/2023 16:13   DG Chest Port 1 View  Result Date: 04/06/2023 CLINICAL  DATA:  post thoracentesis, large right EXAM: PORTABLE CHEST 1 VIEW COMPARISON:  None Available. FINDINGS: There remains almost complete opacification of the right hemithorax status post thoracentesis. No pneumothorax. There is slight rightward shift of the trachea suggestive of volume loss. There is diffuse reticulonodular opacities of the left lung compatible with known extensive disease of the left lung. No appreciable left pleural effusion. No acute osseous abnormality. IMPRESSION: Persistent almost complete opacification of the right lung compatible with persistent large effusion despite recent thoracentesis. Similar extensive disease of the left lung. Electronically Signed   By: Olive Bass M.D.   On: 04/06/2023 15:42   CT CHEST W CONTRAST  Result Date: 04/02/2023 CLINICAL DATA:  Pneumonia, weight loss, lymphoma. EXAM: CT CHEST WITH CONTRAST TECHNIQUE: Multidetector CT imaging of the chest was performed during intravenous contrast administration. RADIATION DOSE REDUCTION: This exam was performed according to the departmental dose-optimization program which includes automated exposure control, adjustment of the mA and/or kV according to patient size and/or use of iterative reconstruction technique.  CONTRAST:  75mL ISOVUE-300 IOPAMIDOL (ISOVUE-300) INJECTION 61% COMPARISON:  None Available. FINDINGS: Cardiovascular: No pericardial effusion or cardiomegaly. Atheromatous calcifications of the coronary arteries. Mediastinum/Nodes: Right paratracheal node measures 1.3 cm. Mediastinal structures are otherwise unremarkable. Lungs/Pleura: Large right-sided pleural effusion with right lung atelectasis. Small pleural effusion on the left. Diffuse nodular interstitial process on the left consistent with either pulmonary metastatic disease or lymphoma. Upper Abdomen: Left adrenal nodule identified with intermediate attenuation measuring 2.5 cm. A neoplastic etiology cannot be excluded. This could be further evaluated  with a noncontrast CT or MRI. Musculoskeletal: Thoracic degenerative changes. No osteolytic or osteoblastic lesions identified. IMPRESSION: 1. Large right-sided pleural effusion with right lung atelectasis. 2. Small left-sided pleural effusion. 3. Extensive diffuse nodules and interstitial changes in the left lung. This is consistent with a neoplastic etiology, likely lymphoma given the patient's history. 4. Indeterminate left adrenal nodule. Electronically Signed   By: Layla Maw M.D.   On: 04/02/2023 21:02    ASSESSMENT: Right pleural effusion and diffuse left lung nodules consistent with underlying malignancy.  PLAN:    Right pleural effusion and diffuse left lung nodules: Highly suspicious for underlying malignancy.  Given patient's extensive smoking history suspect a second primary and not a recurrence of his lymphoma.  Patient has had thoracentesis x 3 with no definitive malignant cells noted.  PET scan results from April 19, 2023 reviewed independently and reported as above with widespread metastatic disease throughout lungs and bones.  MRI of the brain is pending at time of dictation.  Will get biopsy of supraclavicular lymph node as recommended by radiology.  Patient was given a referral to radiation oncology for palliative treatment of his left skull base lesion as well as a left hip lesion.  Return to clinic approximately 1 week after his biopsy to discuss the results and treatment planning.   Pleural effusion: Thoracentesis x 3 as above.  Have recommended Pleurx catheter.  Referral has been sent to interventional radiology.   Headache/neck pain: MRI of the brain pending.  Radiation oncology referral for palliative treatment of metastatic lesions.  Patient was given a prescription for fentanyl patch and instructed to continue oxycodone as needed.   History of diffuse large B-cell lymphoma: Patient completed cycle 6 of R-CHOP in November 2016 at [facility].  By report, PET scan in December 2016  revealed no evidence of disease.  I spent a total of 30 minutes reviewing chart data, face-to-face evaluation with the patient, counseling and coordination of care as detailed above.   Patient expressed understanding and was in agreement with this plan. He also understands that He can call clinic at any time with any questions, concerns, or complaints.    Cancer Staging  No matching staging information was found for the patient.  Jeralyn Ruths, MD   04/21/2023 4:06 PM

## 2023-04-21 NOTE — Progress Notes (Signed)
Met with patient during follow up visit with Dr. Orlie Dakin to review recent imaging results. All questions answered during visit. Reviewed upcoming appts and informed that will meet with him at next clinic visit to provide education for pleurx catheter care/management. Pt will be called with appt for biopsy and pleurx catheter placement once scheduled. Informed pt that he will need to hold eliquis for 2 days prior to biopsy and that he will be notified one when to start holding. Clearance letter faxed to Dr. Windell Hummingbird office to get his approval. Nothing further needed at this time. Instructed pt to call with any further questions or needs. Pt verbalized understanding.

## 2023-04-22 ENCOUNTER — Telehealth: Payer: Self-pay

## 2023-04-22 NOTE — Telephone Encounter (Signed)
1st attempt: I left a message for the patient to call our office to schedule a tele visit for pre-op.

## 2023-04-22 NOTE — Telephone Encounter (Signed)
Patient with diagnosis of afib on Eliquis for anticoagulation.    Procedure: lymph node biopsy Date of procedure: TBD  CHA2DS2-VASc Score = 6  This indicates a 9.7% annual risk of stroke. The patient's score is based upon: CHF History: 1 HTN History: 1 Diabetes History: 1 Stroke History: 2 Vascular Disease History: 1 Age Score: 0 Gender Score: 0   CrCl 30mL/min Platelet count 329K  Per office protocol, patient can hold Eliquis for 1-2 days prior to procedure. He should resume as soon as safely possible after given elevated CV risk.  **This guidance is not considered finalized until pre-operative APP has relayed final recommendations.**

## 2023-04-22 NOTE — Telephone Encounter (Signed)
  Patient Consent for Virtual Visit        Charles Hickman has provided verbal consent on 04/22/2023 for a virtual visit (video or telephone).   CONSENT FOR VIRTUAL VISIT FOR:  Charles Hickman  By participating in this virtual visit I agree to the following:  I hereby voluntarily request, consent and authorize [facility] and its employed or contracted physicians, physician assistants, nurse practitioners or other licensed health care professionals (the Practitioner), to provide me with telemedicine health care services (the "Services") as deemed necessary by the treating Practitioner. I acknowledge and consent to receive the Services by the Practitioner via telemedicine. I understand that the telemedicine visit will involve communicating with the Practitioner through live audiovisual communication technology and the disclosure of certain medical information by electronic transmission. I acknowledge that I have been given the opportunity to request an in-person assessment or other available alternative prior to the telemedicine visit and am voluntarily participating in the telemedicine visit.  I understand that I have the right to withhold or withdraw my consent to the use of telemedicine in the course of my care at any time, without affecting my right to future care or treatment, and that the Practitioner or I may terminate the telemedicine visit at any time. I understand that I have the right to inspect all information obtained and/or recorded in the course of the telemedicine visit and may receive copies of available information for a reasonable fee.  I understand that some of the potential risks of receiving the Services via telemedicine include:  Delay or interruption in medical evaluation due to technological equipment failure or disruption; Information transmitted may not be sufficient (e.g. poor resolution of images) to allow for appropriate medical decision making by the Practitioner;  and/or  In rare instances, security protocols could fail, causing a breach of personal health information.  Furthermore, I acknowledge that it is my responsibility to provide information about my medical history, conditions and care that is complete and accurate to the best of my ability. I acknowledge that Practitioner's advice, recommendations, and/or decision may be based on factors not within their control, such as incomplete or inaccurate data provided by me or distortions of diagnostic images or specimens that may result from electronic transmissions. I understand that the practice of medicine is not an exact science and that Practitioner makes no warranties or guarantees regarding treatment outcomes. I acknowledge that a copy of this consent can be made available to me via my patient portal [facility] MyChart), or I can request a printed copy by calling the office of [facility].    I understand that my insurance will be billed for this visit.   I have read or had this consent read to me. I understand the contents of this consent, which adequately explains the benefits and risks of the Services being provided via telemedicine.  I have been provided ample opportunity to ask questions regarding this consent and the Services and have had my questions answered to my satisfaction. I give my informed consent for the services to be provided through the use of telemedicine in my medical care

## 2023-04-22 NOTE — Telephone Encounter (Signed)
Spoke with patient who is agreeable to do a tele visit on 5/7 at 2:20 pm. Med rec and consent done.

## 2023-04-22 NOTE — Telephone Encounter (Signed)
   Name: Charles Hickman  DOB: Sep 10, 1961  MRN: [id number]  Primary Cardiologist: Julien Nordmann, MD   Preoperative team, please contact this patient and set up a phone call appointment for further preoperative risk assessment. Please obtain consent and complete medication review. Thank you for your help.  I confirm that guidance regarding antiplatelet and oral anticoagulation therapy has been completed and, if necessary, noted below.  Pharmacy has provided recommendations for holding anticoagulation.   Ronney Asters, NP 04/22/2023, 12:49 PM [facility]

## 2023-04-25 ENCOUNTER — Encounter: Payer: Self-pay | Admitting: Radiation Oncology

## 2023-04-25 ENCOUNTER — Other Ambulatory Visit: Payer: Self-pay | Admitting: *Deleted

## 2023-04-25 ENCOUNTER — Ambulatory Visit
Admission: RE | Admit: 2023-04-25 | Discharge: 2023-04-25 | Disposition: A | Discharge status: Home / Self Care | Payer: Managed Care, Other (non HMO) | Source: Ambulatory Visit | Attending: Radiation Oncology | Admitting: Radiation Oncology

## 2023-04-25 VITALS — BP 106/73 | HR 105 | Temp 97.6°F | Resp 16

## 2023-04-25 DIAGNOSIS — Z5111 Encounter for antineoplastic chemotherapy: Secondary | ICD-10-CM | POA: Insufficient documentation

## 2023-04-25 DIAGNOSIS — C7951 Secondary malignant neoplasm of bone: Secondary | ICD-10-CM

## 2023-04-25 DIAGNOSIS — C3491 Malignant neoplasm of unspecified part of right bronchus or lung: Secondary | ICD-10-CM | POA: Insufficient documentation

## 2023-04-25 DIAGNOSIS — Z8572 Personal history of non-Hodgkin lymphomas: Secondary | ICD-10-CM | POA: Insufficient documentation

## 2023-04-25 DIAGNOSIS — Z510 Encounter for antineoplastic radiation therapy: Secondary | ICD-10-CM | POA: Insufficient documentation

## 2023-04-25 DIAGNOSIS — R911 Solitary pulmonary nodule: Secondary | ICD-10-CM

## 2023-04-25 LAB — CYTOLOGY - NON PAP

## 2023-04-25 NOTE — Consult Note (Signed)
NEW PATIENT EVALUATION  Name: Charles Hickman  MRN: [id number]  Date:   04/25/2023     DOB: 04-20-1961   This 62 y.o. male patient presents to the clinic for initial evaluation of bone metastasis and patient with obvious stage IV carcinoma with right pleural effusion and diffuse left lung nodules.  REFERRING PHYSICIAN: Dorothey Baseman, MD  CHIEF COMPLAINT:  Chief Complaint  Patient presents with   metastic bone cancer    DIAGNOSIS: The encounter diagnosis was Secondary malignant neoplasm of bone and bone marrow (HCC).   PREVIOUS INVESTIGATIONS:  PET scan CT scans MRI scan reviewed Clinical notes reviewed Labs reviewed  HPI: Patient is a 62 year old male who presented with increasing pain in his skull base as well as his left hip.  He is also had decreased shortness of breath and thoracentesis x 3 over the past several weeks.  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  His PET scan shows obstruction down right lung with numerous areas of hypermetabolic activity likely due to primary lung neoplasm.  He also has diffuse pulmonary metastatic disease with hypermetabolic supraclavicular mediastinal adenopathy.  Also has left adrenal gland lesion likely metastatic.  There is activity in the left hip highly worrisome for impending fracture with a large suction lesion at the left skull base.  I been asked to evaluate the patient for possible palliative radiation therapy to those 2 areas.  He is seen today fairly weak wheelchair-bound.  PLANNED TREATMENT REGIMEN: Palliative radiation therapy to skull base as well as left hip  PAST MEDICAL HISTORY:  has a past medical history of Abnormal EKG (11/15/2017), Abnormal stress test, Atrial fibrillation (HCC), CAD in native artery, Diabetes mellitus (HCC), HCV (hepatitis C virus), HLD (hyperlipidemia), Hypertension, Large cell lymphoma (HCC), Peripheral neuropathy, Stroke (HCC), and Systolic dysfunction.    PAST  SURGICAL HISTORY:  Past Surgical History:  Procedure Laterality Date   APPENDECTOMY     CORONARY PRESSURE/FFR STUDY N/A 12/15/2017   Procedure: INTRAVASCULAR PRESSURE WIRE/FFR STUDY;  Surgeon: Iran Ouch, MD;  Location: [facility] INVASIVE CV LAB;  Service: Cardiovascular;  Laterality: N/A;   LEFT HEART CATH AND CORONARY ANGIOGRAPHY N/A 12/15/2017   Procedure: LEFT HEART CATH AND CORONARY ANGIOGRAPHY;  Surgeon: Iran Ouch, MD;  Location: [facility] INVASIVE CV LAB;  Service: Cardiovascular;  Laterality: N/A;   TONSILLECTOMY AND ADENOIDECTOMY      FAMILY HISTORY: family history includes Dementia in his maternal grandmother; Diabetes in his father and mother; Hyperlipidemia in his mother; Hypertension in his father and mother; Kidney disease in his father; Stroke in his mother.  SOCIAL HISTORY:  reports that he has been smoking cigarettes. He has been smoking an average of .5 packs per day. He has never used smokeless tobacco. He reports current alcohol use. He reports that he does not use drugs.  ALLERGIES: Patient has no known allergies.  MEDICATIONS:  Current Outpatient Medications  Medication Sig Dispense Refill   apixaban (ELIQUIS) 5 MG TABS tablet TAKE 1 TABLET BY MOUTH TWICE A DAY 180 tablet 1   blood glucose meter kit and supplies KIT Dispense based on patient and insurance preference. Use 2 times daily as directed. (FOR ICD-10 E11.9). 1 each 0   diltiazem (CARDIZEM) 30 MG tablet TAKE 1 TABLET (30 MG TOTAL) BY MOUTH 3 (THREE) TIMES DAILY AS NEEDED. 270 tablet 0   Dulaglutide 0.75 MG/0.5ML SOPN Inject 0.75 mg into the skin once a week.  fentaNYL (DURAGESIC) 25 MCG/HR Place 1 patch onto the skin every 3 (three) days. 10 patch 0   JARDIANCE 25 MG TABS tablet TAKE 1 TABLET BY MOUTH EVERY DAY 90 tablet 2   metoprolol succinate (TOPROL-XL) 50 MG 24 hr tablet Take 1 tablet (50 mg total) by mouth daily. TAKE WITH OR IMMEDIATELY FOLLOWING A MEAL. 90 tablet 3   nitroGLYCERIN (NITROSTAT)  0.4 MG SL tablet Place 1 tablet (0.4 mg total) under the tongue every 5 (five) minutes as needed for chest pain. 25 tablet 3   ONETOUCH VERIO test strip USE TWICE A DAY AS DIRECTED 100 strip 0   oxyCODONE (OXY IR/ROXICODONE) 5 MG immediate release tablet Take 1 tablet (5 mg total) by mouth every 6 (six) hours as needed for severe pain. 60 tablet 0   rosuvastatin (CRESTOR) 10 MG tablet Take 1 tablet (10 mg total) by mouth daily. 90 tablet 3   No current facility-administered medications for this encounter.    ECOG PERFORMANCE STATUS:  2 - Symptomatic, <50% confined to bed  REVIEW OF SYSTEMS: Patient does have history of diabetes mellitus hepatitis C large cell lymphoma peripheral neuropathy CVA atrial fibrillation Patient denies any weight loss, fatigue, weakness, fever, chills or night sweats. Patient denies any loss of vision, blurred vision. Patient denies any ringing  of the ears or hearing loss. No irregular heartbeat. Patient denies heart murmur or history of fainting. Patient denies any chest pain or pain radiating to her upper extremities. Patient denies any shortness of breath, difficulty breathing at night, cough or hemoptysis. Patient denies any swelling in the lower legs. Patient denies any nausea vomiting, vomiting of blood, or coffee ground material in the vomitus. Patient denies any stomach pain. Patient states has had normal bowel movements no significant constipation or diarrhea. Patient denies any dysuria, hematuria or significant nocturia. Patient denies any problems walking, swelling in the joints or loss of balance. Patient denies any skin changes, loss of hair or loss of weight. Patient denies any excessive worrying or anxiety or significant depression. Patient denies any problems with insomnia. Patient denies excessive thirst, polyuria, polydipsia. Patient denies any swollen glands, patient denies easy bruising or easy bleeding. Patient denies any recent infections, allergies or URI.  Patient "s visual fields have not changed significantly in recent time.   PHYSICAL EXAM: BP 106/73   Pulse (!) 105   Temp 97.6 F (36.4 C) (Tympanic)   Resp 16  Frail-appearing male wheelchair-bound in NAD range of motion of his left lower extremity does elicit pain he has decreased motor strength in his left lower extremity most likely due to guarding.  Crude visual fields are within normal range motor and sensory levels are equal and symmetric in the lower extremities.  Well-developed well-nourished patient in NAD. HEENT reveals PERLA, EOMI, discs not visualized.  Oral cavity is clear. No oral mucosal lesions are identified. Neck is clear without evidence of cervical or supraclavicular adenopathy. Lungs are clear to A&P. Cardiac examination is essentially unremarkable with regular rate and rhythm without murmur rub or thrill. Abdomen is benign with no organomegaly or masses noted. Motor sensory and DTR levels are equal and symmetric in the upper and lower extremities. Cranial nerves II through XII are grossly intact. Proprioception is intact. No peripheral adenopathy or edema is identified. No motor or sensory levels are noted. Crude visual fields are within normal range.  LABORATORY DATA: Labs reviewed biopsy is pending    RADIOLOGY RESULTS: CT scans MRI scans and PET scan  all reviewed compatible with above-stated findings   IMPRESSION: Metastatic most probable lung cancer with significant metastatic disease to his left hip as well as base of skull in 62 year old male  PLAN: At this time elected ahead with palliative radiation therapy to his pelvis and left hip.  Will plan on delivering 30 Gray in 10 fractions to that area.  Based on the impending fracture I will start that area soon as possible and up or reschedule him for simulation this week.  Will also treat his base of skull lesion with 30 Gray in 5 fractions using IMRT treatment planning and delivery based on the critical nature of this  metastatic deposit near the base of skull in the head and neck region.  Patient comprehends my recommendations well.  Simulation was ordered.  I would like to take this opportunity to thank you for allowing me to participate in the care of your patient.Carmina Miller, MD

## 2023-04-25 NOTE — Progress Notes (Signed)
Simonne Come, MD sent to Charles Hickman [facility] for US guided left supraclavicular LN Bx, port placement and right sided pleur-x catheter placement.  All procedures may be performed same in the IR suite but would recommend confirming this plan with the performing IR MD.  PET CT - 04/19/23 - image 42, series 610 3 prior large volume thoras, most recently on 5/1 yielding 1.2 L.   Nedra Hai

## 2023-04-26 ENCOUNTER — Other Ambulatory Visit: Payer: Self-pay | Admitting: *Deleted

## 2023-04-26 ENCOUNTER — Ambulatory Visit: Payer: Managed Care, Other (non HMO) | Attending: Cardiology | Admitting: Student

## 2023-04-26 ENCOUNTER — Encounter: Payer: Self-pay | Admitting: *Deleted

## 2023-04-26 DIAGNOSIS — Z01810 Encounter for preprocedural cardiovascular examination: Secondary | ICD-10-CM

## 2023-04-26 DIAGNOSIS — M7989 Other specified soft tissue disorders: Secondary | ICD-10-CM

## 2023-04-26 NOTE — Progress Notes (Signed)
Virtual Visit via Telephone Note   Because of Charles Hickman's co-morbid illnesses, he is at least at moderate risk for complications without adequate follow up.  This format is felt to be most appropriate for this patient at this time.  The patient did not have access to video technology/had technical difficulties with video requiring transitioning to audio format only (telephone).  All issues noted in this document were discussed and addressed.  No physical exam could be performed with this format.  Please refer to the patient's chart for his consent to telehealth for [facility].  Evaluation Performed:  Preoperative cardiovascular risk assessment _____________   Date:  04/26/2023   Patient ID:  Charles Hickman, DOB [birth year], MRN [id number] Patient Location:  Home Provider location:   Office  Primary Care Provider:  Dorothey Baseman, MD Primary Cardiologist:  Julien Nordmann, MD  Chief Complaint / Patient Profile   62 y.o. y/o male with a h/o CAD s/p stent to LAD and CTO distal RCA December 2018, PAF on anticoagulation, hypertension, hyperlipidemia, palpitations, lymphoma, T2DM, nicotine dependence, CVA who is pending lymph node biopsy by [facility] and presents today for telephonic preoperative cardiovascular risk assessment.  History of Present Illness    Charles Hickman is a 62 y.o. male who presents via audio/video conferencing for a telehealth visit today.  Pt was last seen in cardiology clinic on 05/28/2022 by Dr. Mariah Milling.  At that time VANDAN KUSH was doing well.  The patient is now pending procedure as outlined above. Since his last visit, he is stable from a cardiac standpoint. He continues to have shortness of breath secondary to right sided pleural effusion requiring multiple thoracentesis. He also has a tumor on his left hip that is causing some edema of left foot and has caused very limited activity in the last week. He is independent with ADLs and some  light household activities. He denies chest pain, orthopnea or PND.   Past Medical History    Past Medical History:  Diagnosis Date   Abnormal EKG 11/15/2017   Abnormal stress test    Atrial fibrillation (HCC)    CAD in native artery    a. 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 No EKG changes concerning for ischemia at peak stress or in recovery. Mod to high risk scan; b. LHC 12/15/17: pLAD 75% w/ FFR 0.74 s/p PCI/DES, mRCA 90%, CTO dRCA w/ L-R colatts, EF 45-50%, inf HK    Diabetes mellitus (HCC)    HCV (hepatitis C virus)    HLD (hyperlipidemia)    Hypertension    Large cell lymphoma (HCC)    a. s/p 6 cycles of R-CHOP   Peripheral neuropathy    Stroke [facility])    Systolic dysfunction    a. TTE 8/16: EF > 55%, aortic sclerosis, normal RV systolic function; b. LHC 12/15/17: EF 45-50%, inf HK   Past Surgical History:  Procedure Laterality Date   APPENDECTOMY     CORONARY PRESSURE/FFR STUDY N/A 12/15/2017   Procedure: INTRAVASCULAR PRESSURE WIRE/FFR STUDY;  Surgeon: Iran Ouch, MD;  Location: [facility] INVASIVE CV LAB;  Service: Cardiovascular;  Laterality: N/A;   LEFT HEART CATH AND CORONARY ANGIOGRAPHY N/A 12/15/2017   Procedure: LEFT HEART CATH AND CORONARY ANGIOGRAPHY;  Surgeon: Iran Ouch, MD;  Location: [facility] INVASIVE  CV LAB;  Service: Cardiovascular;  Laterality: N/A;   TONSILLECTOMY AND ADENOIDECTOMY      Allergies  No Known Allergies  Home Medications    Prior to Admission medications   Medication Sig Start Date End Date Taking? Authorizing Provider  apixaban (ELIQUIS) 5 MG TABS tablet TAKE 1 TABLET BY MOUTH TWICE A DAY 04/12/23   Antonieta Iba, MD  blood glucose meter kit and supplies KIT Dispense based on patient and insurance preference. Use 2 times daily as directed. (FOR ICD-10 E11.9). 09/29/20   Glori Luis, MD   diltiazem (CARDIZEM) 30 MG tablet TAKE 1 TABLET (30 MG TOTAL) BY MOUTH 3 (THREE) TIMES DAILY AS NEEDED. 07/26/22   Antonieta Iba, MD  Dulaglutide 0.75 MG/0.5ML SOPN Inject 0.75 mg into the skin once a week. 03/03/23   [provider]  fentaNYL (DURAGESIC) 25 MCG/HR Place 1 patch onto the skin every 3 (three) days. 04/21/23   Jeralyn Ruths, MD  JARDIANCE 25 MG TABS tablet TAKE 1 TABLET BY MOUTH EVERY DAY 02/22/22   Worthy Rancher B, FNP  metoprolol succinate (TOPROL-XL) 50 MG 24 hr tablet Take 1 tablet (50 mg total) by mouth daily. TAKE WITH OR IMMEDIATELY FOLLOWING A MEAL. 05/28/22   Gollan, Tollie Pizza, MD  nitroGLYCERIN (NITROSTAT) 0.4 MG SL tablet Place 1 tablet (0.4 mg total) under the tongue every 5 (five) minutes as needed for chest pain. 05/28/22   Antonieta Iba, MD  ONETOUCH VERIO test strip USE TWICE A DAY AS DIRECTED 11/20/20   Glori Luis, MD  oxyCODONE (OXY IR/ROXICODONE) 5 MG immediate release tablet Take 1 tablet (5 mg total) by mouth every 6 (six) hours as needed for severe pain. 04/12/23   Jeralyn Ruths, MD  rosuvastatin (CRESTOR) 10 MG tablet Take 1 tablet (10 mg total) by mouth daily. 05/28/22   Antonieta Iba, MD    Physical Exam    Vital Signs:  Charles Hickman does not have vital signs available for review today.  Given telephonic nature of communication, physical exam is limited. AAOx3. NAD. Normal affect.  Speech and respirations are unlabored.  Accessory Clinical Findings    None  Assessment & Plan    Primary Cardiologist: Julien Nordmann, MD  Preoperative cardiovascular risk assessment.  Lymph node biopsy with [facility].  Chart reviewed as part of pre-operative protocol coverage. According to the RCRI, patient has a 6.6% risk of MACE. Patient reports activity equivalent to 4.64 METS (per DASI).   Given past medical history and time since last visit, based on ACC/AHA guidelines, DAILON PERRIN would be at moderate risk for the  planned procedure without further cardiovascular testing.   Patient was advised that if he develops new symptoms prior to surgery to contact our office to arrange a follow-up appointment.  he verbalized understanding.  2. Anti-coag.  Per Pharm.D.: Patient with diagnosis of afib on Eliquis for anticoagulation.     Procedure: lymph node biopsy Date of procedure: TBD   CHA2DS2-VASc Score = 6  This indicates a 9.7% annual risk of stroke. The patient's score is based upon: CHF History: 1 HTN History: 1 Diabetes History: 1 Stroke History: 2 Vascular Disease History: 1 Age Score: 0 Gender Score: 0   CrCl 44mL/min Platelet count 329K   Per office protocol, patient can hold Eliquis for 1-2 days prior to procedure. He should resume as soon as safely possible after given elevated CV risk.  I will route this recommendation  to the requesting party via Epic fax function.  Please call with questions.  Time:   Today, I have spent 7 minutes with the patient with telehealth technology discussing medical history, symptoms, and management plan.     Carlos Levering, NP  04/26/2023, 9:01 AM

## 2023-04-26 NOTE — Patient Instructions (Signed)
Orders for pleurx catheter supplies faxed to [facility]. Patient will have pleurx catheter placed on 5/13.

## 2023-04-27 ENCOUNTER — Ambulatory Visit: Payer: Managed Care, Other (non HMO)

## 2023-04-27 ENCOUNTER — Ambulatory Visit
Admission: RE | Admit: 2023-04-27 | Discharge: 2023-04-27 | Disposition: A | Discharge status: Home / Self Care | Payer: Managed Care, Other (non HMO) | Source: Ambulatory Visit | Attending: Radiation Oncology | Admitting: Radiation Oncology

## 2023-04-27 ENCOUNTER — Encounter: Payer: Self-pay | Admitting: *Deleted

## 2023-04-27 DIAGNOSIS — Z5111 Encounter for antineoplastic chemotherapy: Secondary | ICD-10-CM | POA: Diagnosis not present

## 2023-04-27 DIAGNOSIS — C7951 Secondary malignant neoplasm of bone: Secondary | ICD-10-CM | POA: Diagnosis present

## 2023-04-27 DIAGNOSIS — C3491 Malignant neoplasm of unspecified part of right bronchus or lung: Secondary | ICD-10-CM | POA: Diagnosis not present

## 2023-04-27 DIAGNOSIS — Z510 Encounter for antineoplastic radiation therapy: Secondary | ICD-10-CM | POA: Diagnosis present

## 2023-04-27 DIAGNOSIS — Z8572 Personal history of non-Hodgkin lymphomas: Secondary | ICD-10-CM | POA: Diagnosis not present

## 2023-04-27 NOTE — Progress Notes (Signed)
Met with patient after CT simulation today to provide education regarding pleurx catheter care and drainage. Demonstration video watched by patient and provided demonstration. Pt was engaged during teaching and asking questions. Pt and wife given packet regarding pleurx catheter and instructed to call [facility] services to set up delivery of supplies. Instructed for pt to drain pleurx cath daily per MD recommendations and to keep a log of fluid removed. Reviewed upcoming appts with patient. Informed that can assist with first drainage on Tues 5/14 if needed. Nothing further needed at this time. Will follow up at next clinic visit.

## 2023-04-28 ENCOUNTER — Inpatient Hospital Stay: Payer: Managed Care, Other (non HMO) | Admitting: Oncology

## 2023-04-28 ENCOUNTER — Encounter: Payer: Self-pay | Admitting: Oncology

## 2023-04-28 ENCOUNTER — Ambulatory Visit
Admission: RE | Admit: 2023-04-28 | Discharge: 2023-04-28 | Disposition: A | Discharge status: Home / Self Care | Payer: Managed Care, Other (non HMO) | Source: Ambulatory Visit | Attending: Oncology | Admitting: Oncology

## 2023-04-28 VITALS — BP 104/80 | HR 100 | Temp 98.2°F | Resp 16 | Ht 70.0 in | Wt 168.0 lb

## 2023-04-28 DIAGNOSIS — C3491 Malignant neoplasm of unspecified part of right bronchus or lung: Secondary | ICD-10-CM

## 2023-04-28 DIAGNOSIS — Z510 Encounter for antineoplastic radiation therapy: Secondary | ICD-10-CM | POA: Diagnosis not present

## 2023-04-28 DIAGNOSIS — M7989 Other specified soft tissue disorders: Secondary | ICD-10-CM | POA: Insufficient documentation

## 2023-04-28 MED ORDER — PROCHLORPERAZINE MALEATE 10 MG PO TABS
10.00 mg | ORAL_TABLET | Freq: Four times a day (QID) | ORAL | 2 refills | Status: DC | PRN
Start: 2023-04-28 — End: 2023-06-25

## 2023-04-28 MED ORDER — ONDANSETRON HCL 8 MG PO TABS
8.0000 mg | ORAL_TABLET | Freq: Three times a day (TID) | ORAL | 2 refills | Status: DC | PRN
Start: 2023-04-28 — End: 2023-06-25

## 2023-04-28 MED ORDER — LIDOCAINE-PRILOCAINE 2.5-2.5 % EX CREA
TOPICAL_CREAM | CUTANEOUS | 3 refills | Status: DC
Start: 2023-04-28 — End: 2023-06-25

## 2023-04-28 NOTE — Progress Notes (Signed)
[facility]  [phone] [phone]  ID: Charles Hickman OB: 02/20/1961  MR#: [id number]  QIO#:[id number]  Patient Care Team: Dorothey Baseman, MD as PCP - General (Family Medicine) Mariah Milling Tollie Pizza, MD as PCP - Cardiology (Cardiology) Glory Buff, RN as Oncology Nurse Navigator  CHIEF COMPLAINT: Stage IV adenocarcinoma of the right lung.  INTERVAL HISTORY: Patient returns to clinic today for further evaluation and treatment planning.  Shortness of breath is mildly improved.  He continues to have pain at his left skull base as well as his left hip. He has a poor appetite.  He has no other neurologic complaints.  He denies any recent fevers.  He denies any chest pain, cough, or hemoptysis.  He denies any nausea, vomiting, constipation, or diarrhea.  He has no urinary complaints.  He has noticed some left leg swelling.  Patient offers no further specific complaints.  REVIEW OF SYSTEMS:   Review of Systems  Constitutional:  Positive for malaise/fatigue and weight loss. Negative for fever.  Respiratory:  Positive for shortness of breath. Negative for cough and hemoptysis.   Cardiovascular:  Positive for leg swelling. Negative for chest pain.  Gastrointestinal: Negative.  Negative for abdominal pain.  Genitourinary: Negative.  Negative for dysuria and hematuria.  Musculoskeletal:  Positive for joint pain. Negative for back pain.  Skin: Negative.  Negative for rash.  Neurological:  Positive for weakness and headaches. Negative for dizziness and seizures.  Psychiatric/Behavioral: Negative.  The patient is not nervous/anxious.     As per HPI. Otherwise, a complete review of systems is negative.  PAST MEDICAL HISTORY: Past Medical History:  Diagnosis Date   Abnormal EKG 11/15/2017   Abnormal stress test    Atrial fibrillation (HCC)    CAD in native artery    a. Myoview 12/18: mod sized region of mild ischemia in the mid to apical inferior wall c/w  peri-infarct ischemia, large region of HK of the inf wall, EF 34%, EKG NSR w/ old inf MI. No EKG changes concerning for ischemia at peak stress or in recovery. Mod to high risk scan; b. LHC 12/15/17: pLAD 75% w/ FFR 0.74 s/p PCI/DES, mRCA 90%, CTO dRCA w/ L-R colatts, EF 45-50%, inf HK    Diabetes mellitus (HCC)    HCV (hepatitis C virus)    HLD (hyperlipidemia)    Hypertension    Large cell lymphoma (HCC)    a. s/p 6 cycles of R-CHOP   Peripheral neuropathy    Stroke [facility])    Systolic dysfunction    a. TTE 8/16: EF > 55%, aortic sclerosis, normal RV systolic function; b. LHC 12/15/17: EF 45-50%, inf HK    PAST SURGICAL HISTORY: Past Surgical History:  Procedure Laterality Date   APPENDECTOMY     CORONARY PRESSURE/FFR STUDY N/A 12/15/2017   Procedure: INTRAVASCULAR PRESSURE WIRE/FFR STUDY;  Surgeon: Iran Ouch, MD;  Location: [facility] INVASIVE CV LAB;  Service: Cardiovascular;  Laterality: N/A;   LEFT HEART CATH AND CORONARY ANGIOGRAPHY N/A 12/15/2017   Procedure: LEFT HEART CATH AND CORONARY ANGIOGRAPHY;  Surgeon: Iran Ouch, MD;  Location: [facility] INVASIVE CV LAB;  Service: Cardiovascular;  Laterality: N/A;   TONSILLECTOMY AND ADENOIDECTOMY      FAMILY HISTORY: Family History  Problem Relation Age of Onset   Hypertension Mother    Hyperlipidemia Mother    Stroke Mother    Diabetes Mother    Kidney disease Father    Hypertension Father    Diabetes Father  Dementia Maternal Grandmother     ADVANCED DIRECTIVES (Y/N):  N  HEALTH MAINTENANCE: Social History   Tobacco Use   Smoking status: Every Day    Packs/day: .5    Types: Cigarettes   Smokeless tobacco: Never   Tobacco comments:    Down to a couple of cigarettes a day  Substance Use Topics   Alcohol use: Yes    Comment: occasional   Drug use: No     Colonoscopy:  PAP:  Bone density:  Lipid panel:  No Known Allergies  Current Outpatient Medications  Medication Sig Dispense Refill   apixaban  (ELIQUIS) 5 MG TABS tablet TAKE 1 TABLET BY MOUTH TWICE A DAY 180 tablet 1   blood glucose meter kit and supplies KIT Dispense based on patient and insurance preference. Use 2 times daily as directed. (FOR ICD-10 E11.9). 1 each 0   diltiazem (CARDIZEM) 30 MG tablet TAKE 1 TABLET (30 MG TOTAL) BY MOUTH 3 (THREE) TIMES DAILY AS NEEDED. 270 tablet 0   Dulaglutide 0.75 MG/0.5ML SOPN Inject 0.75 mg into the skin once a week.     fentaNYL (DURAGESIC) 25 MCG/HR Place 1 patch onto the skin every 3 (three) days. 10 patch 0   JARDIANCE 25 MG TABS tablet TAKE 1 TABLET BY MOUTH EVERY DAY 90 tablet 2   metoprolol succinate (TOPROL-XL) 50 MG 24 hr tablet Take 1 tablet (50 mg total) by mouth daily. TAKE WITH OR IMMEDIATELY FOLLOWING A MEAL. 90 tablet 3   nitroGLYCERIN (NITROSTAT) 0.4 MG SL tablet Place 1 tablet (0.4 mg total) under the tongue every 5 (five) minutes as needed for chest pain. 25 tablet 3   ONETOUCH VERIO test strip USE TWICE A DAY AS DIRECTED 100 strip 0   oxyCODONE (OXY IR/ROXICODONE) 5 MG immediate release tablet Take 1 tablet (5 mg total) by mouth every 6 (six) hours as needed for severe pain. 60 tablet 0   rosuvastatin (CRESTOR) 10 MG tablet Take 1 tablet (10 mg total) by mouth daily. 90 tablet 3   lidocaine-prilocaine (EMLA) cream Apply to affected area once 30 g 3   ondansetron (ZOFRAN) 8 MG tablet Take 1 tablet (8 mg total) by mouth every 8 (eight) hours as needed for nausea or vomiting. Start on the third day after carboplatin. 60 tablet 2   prochlorperazine (COMPAZINE) 10 MG tablet Take 1 tablet (10 mg total) by mouth every 6 (six) hours as needed for nausea or vomiting. 60 tablet 2   No current facility-administered medications for this visit.    OBJECTIVE: Vitals:   04/28/23 1355  BP: 104/80  Pulse: 100  Resp: 16  Temp: 98.2 F (36.8 C)  SpO2: 96%     Body mass index is 24.11 kg/m.    ECOG FS:1 - Symptomatic but completely ambulatory  General: Well-developed, well-nourished,  no acute distress.  Sitting in a wheelchair. Eyes: Pink conjunctiva, anicteric sclera. HEENT: Normocephalic, moist mucous membranes. Lungs: No audible wheezing or coughing. Heart: Regular rate and rhythm. Abdomen: Soft, nontender, no obvious distention. Musculoskeletal: No edema, cyanosis, or clubbing. Neuro: Alert, answering all questions appropriately. Cranial nerves grossly intact. Skin: No rashes or petechiae noted. Psych: Normal affect.   LAB RESULTS:  Lab Results  Component Value Date   NA 136 04/06/2023   K 4.4 04/06/2023   CL 101 04/06/2023   CO2 25 04/06/2023   GLUCOSE 197 (H) 04/06/2023   BUN 15 04/06/2023   CREATININE 0.84 04/06/2023   CALCIUM 9.4 04/06/2023  PROT 8.0 04/06/2023   ALBUMIN 3.8 04/06/2023   AST 17 04/06/2023   ALT 15 04/06/2023   ALKPHOS 90 04/06/2023   BILITOT 0.5 04/06/2023   GFRNONAA >60 04/06/2023   GFRAA 87 12/11/2020    Lab Results  Component Value Date   WBC 9.7 04/06/2023   NEUTROABS 6.8 04/06/2023   HGB 14.5 04/06/2023   HCT 46.1 04/06/2023   MCV 85.7 04/06/2023   PLT 329 04/06/2023     STUDIES: US Venous Img Lower Unilateral Left  Result Date: 04/28/2023 CLINICAL DATA:  Leg swelling EXAM: Left LOWER EXTREMITY VENOUS DOPPLER ULTRASOUND TECHNIQUE: Gray-scale sonography with compression, as well as color and duplex ultrasound, were performed to evaluate the deep venous system(s) from the level of the common femoral vein through the popliteal and proximal calf veins. COMPARISON:  None Available. FINDINGS: VENOUS Normal compressibility of the common femoral, superficial femoral, and popliteal veins, as well as the visualized calf veins. Visualized portions of profunda femoral vein and great saphenous vein unremarkable. No filling defects to suggest DVT on grayscale or color Doppler imaging. Doppler waveforms show normal direction of venous flow, normal respiratory plasticity and response to augmentation. Limited views of the contralateral  common femoral vein are unremarkable. OTHER There is slow flow seen in the left popliteal vein. Limitations: none IMPRESSION: No evidence of left lower extremity DVT Electronically Signed   By: Karen Kays M.D.   On: 04/28/2023 15:58   MR Brain W Wo Contrast  Result Date: 04/22/2023 CLINICAL DATA:  Initial staging for lung nodules. Headaches. History of lymphoma. EXAM: MRI HEAD WITHOUT AND WITH CONTRAST TECHNIQUE: Multiplanar, multiecho pulse sequences of the brain and surrounding structures were obtained without and with intravenous contrast. CONTRAST:  8.5 mL Vueway COMPARISON:  Head MRI 06/01/2011.  PET-CT 04/19/2023. FINDINGS: Brain: There is no evidence of an acute infarct, intracranial hemorrhage, midline shift, or extra-axial fluid collection. Scattered small T2 hyperintensities in the cerebral white matter have mildly progressed from 2012 and are nonspecific but compatible with mild chronic small vessel ischemic disease. A chronic lacunar infarct is noted in the right corona radiata. The ventricles and sulci are within normal limits for age. A cavum septum pellucidum et vergae is incidentally noted. There is a 3 mm round enhancing lesion in the left cerebellar hemisphere without edema (series 119, image 31). Vascular: Major intracranial vascular flow voids are preserved. Skull and upper cervical spine: As seen on PET-CT, there is an approximately 5 cm destructive mass centered in the left occipital bone at the skull base. There is associated dural thickening and enhancement overlying the inferior and lateral aspects of the left cerebellar hemisphere. Tumor involves the left hypoglossal canal and jugular foramen. There is a separate 1 cm lesion in the clivus. Sinuses/Orbits: Unremarkable orbits. Moderate mucosal thickening and minimal fluid in the left maxillary sinus. Moderate left mastoid fluid. Other: None. IMPRESSION: 1. 3 mm enhancing lesion in the left cerebellar hemisphere suspicious for a  metastasis. 2. 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 3. Additional small lesion in the clivus. Electronically Signed   By: Sebastian Ache M.D.   On: 04/22/2023 08:05   NM PET Image Initial (PI) Skull Base To Thigh  Result Date: 04/20/2023 CLINICAL DATA:  Initial treatment strategy for pulmonary nodules. Prior history of lymphoma. EXAM: NUCLEAR MEDICINE PET SKULL BASE TO THIGH TECHNIQUE: 9.22 mCi F-18 FDG was injected intravenously. Full-ring PET imaging was performed from the skull base to thigh after the radiotracer. CT data was  obtained and used for attenuation correction and anatomic localization. Fasting blood glucose: 113 mg/dl COMPARISON:  Chest CT 30/86/5784 FINDINGS: Mediastinal blood pool activity: SUV max 2.43 Liver activity: SUV max 2.70 NECK: No neck mass or cervical lymphadenopathy. Incidental CT findings: None. CHEST: Small lymph node noted in the left supraclavicular fossa with SUV max of 2.3. There are also small supraclavicular and subclavicular lymph nodes on the left side which are hypermetabolic. SUV max is 2.86 The right lung is completely obstructed/drowned with a abrupt cut off of the right mainstem bronchus. There are numerous areas of hypermetabolism throughout the obstructed right lung. Right upper lobe hypermetabolism near the bronchial cut has an SUV max of 6.59. Findings consistent with a probable primary lung neoplasm and diffuse pulmonary metastatic disease. There is also a large right pleural effusion and multiple hypermetabolic pleural lesions. The recent thoracentesis may yield a pathologic diagnosis. There is also mediastinal lymphadenopathy which is hypermetabolic. The largest upper right mediastinal disease measures a maximum of 23 mm and the SUV max is 5.70. Diffuse pulmonary nodules throughout the aerated left lung. The larger nodules demonstrate hypermetabolism in her consistent with metastatic disease. Incidental CT findings: The heart is normal in size. No  pericardial effusion. There are age advanced aortic and three-vessel coronary artery calcifications. ABDOMEN/PELVIS: No findings to suggest hepatic metastatic disease. There is a 2.2 cm left adrenal gland lesion demonstrating mild hypermetabolism with SUV max of 3.76 which is likely a metastatic focus. No enlarged or hypermetabolic mesenteric or retroperitoneal nodes to suggest recurrent lymphoma or metastatic adenopathy. No pelvic adenopathy. Incidental CT findings: Significant age advanced atherosclerotic calcification involving the aorta and branch vessels but no aneurysm. SKELETON: Diffuse osseous metastatic disease. There is a large destructive lesion involving the left occipital bone at the skull base with SUV max of 6.53. Left-sided sternal lesion has an SUV max of 5.87. Left fifth anterior rib has an SUV max of 6.32. Multiple vertebral body lesions without spinal canal compromise. T9 lesion has an SUV max of 4.89. L1 lesion has an SUV max of 6.56. Large destructive central sacral lesion has an SUV max of 5.50. Large destructive left hip lesion has an SUV max of 4.75. Findings highly worrisome for impending fracture. Right hip lesion at the level of the lesser trochanter has an SUV max of 5.22. Incidental CT findings: None. IMPRESSION: 1. Obstructed/drowned right lung with numerous areas of hypermetabolism likely due to a primary lung neoplasm. 2. Large right pleural effusion with multiple hypermetabolic pleural lesions. The recent thoracentesis may yield a pathologic diagnosis. If not, biopsy of a left supraclavicular node may be the easiest and safest tissue diagnosis. 3. Diffuse pulmonary metastatic disease. 4. Hypermetabolic supraclavicular and mediastinal lymphadenopathy. 5. Left adrenal gland lesion is likely a metastatic lesion. 6. Diffuse osseous metastatic disease. The left hip lesion is highly worrisome for impending fracture. Large destructive lesion at the left skull base. Vertebral body lesions  but no spinal canal compromise. 7. Age advanced atherosclerotic calcification involving the aorta and three-vessel coronary artery calcifications. Aortic Atherosclerosis (ICD10-I70.0). Electronically Signed   By: Rudie Meyer M.D.   On: 04/20/2023 16:38   US THORACENTESIS ASP PLEURAL SPACE W/IMG GUIDE  Result Date: 04/20/2023 INDICATION: History of lymphoma found to have right-sided recurrent pleural effusion. Request received for diagnostic and therapeutic right thoracentesis. EXAM: ULTRASOUND GUIDED DIAGNOSTIC AND THERAPEUTIC RIGHT THORACENTESIS MEDICATIONS: 10 mL 1 % lidocaine COMPLICATIONS: None immediate. PROCEDURE: An ultrasound guided thoracentesis was thoroughly discussed with the patient and questions answered.  The benefits, risks, alternatives and complications were also discussed. The patient understands and wishes to proceed with the procedure. Written consent was obtained. Ultrasound was performed to localize and mark an adequate pocket of fluid in the right chest. The area was then prepped and draped in the normal sterile fashion. 1% Lidocaine was used for local anesthesia. Under ultrasound guidance a 6 Fr Safe-T-Centesis catheter was introduced. Thoracentesis was performed. The catheter was removed and a dressing applied. FINDINGS: A total of approximately 1.2 L of hazy, amber fluid was removed. Samples were sent to the laboratory as requested by the clinical team. IMPRESSION: Successful ultrasound guided right thoracentesis yielding 1.2 L of pleural fluid. Read by: Alex Gardener, AGNP-BC Electronically Signed   By: Olive Bass M.D.   On: 04/20/2023 16:16   DG Chest Port 1 View  Result Date: 04/20/2023 CLINICAL DATA:  Right pleural effusion.  Status post thoracentesis. EXAM: PORTABLE CHEST 1 VIEW COMPARISON:  Chest x-ray 04/11/2023 and recent PET-CT 04/19/2023. FINDINGS: Persistent complete opacification of the right hemithorax with a bronchial cut off sign suggesting diffuse  drowned/obstructed right lung. I do not see any obvious hypermetabolic central lung mass on the PET-CT. Recommend bronchoscopic evaluation. The left lung demonstrates diffuse interstitial and airspace process and innumerable pulmonary nodules. IMPRESSION: Persistent complete opacification of the right hemithorax despite the thoracentesis with a bronchial cut off sign suggesting diffuse drowned/obstructed right lung. Recommend bronchoscopic evaluation. Electronically Signed   By: Rudie Meyer M.D.   On: 04/20/2023 16:13   US THORACENTESIS ASP PLEURAL SPACE W/IMG GUIDE  Result Date: 04/11/2023 INDICATION: Patient with history of lymphoma. Found to have right-sided pleural effusion with diffuse left lobe lung nodules consistent with underlying malignancy yet to be determined. Patient presents for therapeutic and diagnostic thoracentesis EXAM: ULTRASOUND GUIDED THERAPEUTIC AND DIAGNOSTIC THORACENTESIS MEDICATIONS: Lidocaine 1% 10 mL COMPLICATIONS: None immediate. PROCEDURE: An ultrasound guided thoracentesis was thoroughly discussed with the patient and questions answered. The benefits, risks, alternatives and complications were also discussed. The patient understands and wishes to proceed with the procedure. Written consent was obtained. Ultrasound was performed to localize and mark an adequate pocket of fluid in the right chest. The area was then prepped and draped in the normal sterile fashion. 1% Lidocaine was used for local anesthesia. Under ultrasound guidance a 6 Fr Safe-T-Centesis catheter was introduced. Thoracentesis was performed. The catheter was removed and a dressing applied. FINDINGS: A total of approximately 1 L of amber color fluid was removed. Samples were sent to the laboratory as requested by the clinical team. IMPRESSION: Successful ultrasound guided right-sided therapeutic and diagnostic thoracentesis yielding 1 L of pleural fluid. Read by: Anders Grant, NP Electronically Signed   By:  Malachy Moan M.D.   On: 04/11/2023 16:10   DG Chest Port 1 View  Result Date: 04/11/2023 CLINICAL DATA:  Right pleural effusion status post thoracentesis. 1 L of fluid drained from right pleural space earlier today. EXAM: PORTABLE CHEST 1 VIEW COMPARISON:  AP chest 04/06/2023, chest two views 11/15/2017; CT chest 03/31/2023 FINDINGS: There is again complete homogeneous opacification of the right hemithorax. Mild rightward mediastinal shift is similar to 04/06/2023 and 03/31/2023 CT, likely due to the compressive atelectasis associated with a large right pleural effusion. Diffuse left lung moderate interstitial thickening and reticulonodular opacities are similar to prior. No definite left pleural effusion, noting no pleural effusion was seen on 04/06/2023 radiographs but a small pleural effusion was seen on 03/31/2023 CT. No pneumothorax. Visualized cardiac silhouette and mediastinal contours  are unremarkable. Mild-to-moderate atherosclerotic calcification within the aortic arch. No acute skeletal abnormality. IMPRESSION: 1. Complete homogeneous opacification of the right hemithorax, similar to 04/06/2023 and 03/31/2023 CT, likely due to the compressive atelectasis associated with persistent large right pleural effusion. Note is made of right-sided thoracentesis earlier today, and the persistent right hemithorax homogeneous opacification is similar to that seen on 04/06/2023 radiograph following thoracentesis earlier that day as well. 2. Diffuse left lung moderate interstitial thickening and reticulonodular opacities, similar to prior. Appearance on prior CT was suggestive of possible neoplastic etiology including lymphoma given patient history of lymphoma. Electronically Signed   By: Neita Garnet M.D.   On: 04/11/2023 15:45   US THORACENTESIS ASP PLEURAL SPACE W/IMG GUIDE  Result Date: 04/06/2023 INDICATION: Patient with a history of right pleural effusion and diffuse left lung nodules consistent with  underlying malignancy. Patient also has a history of lymphoma. Interventional radiology asked to perform a diagnostic and therapeutic thoracentesis. EXAM: ULTRASOUND GUIDED THORACENTESIS MEDICATIONS: 1% lidocaine 10 mL COMPLICATIONS: None immediate. PROCEDURE: An ultrasound guided thoracentesis was thoroughly discussed with the patient and questions answered. The benefits, risks, alternatives and complications were also discussed. The patient understands and wishes to proceed with the procedure. Written consent was obtained. Ultrasound was performed to localize and mark an adequate pocket of fluid in the right chest. The area was then prepped and draped in the normal sterile fashion. 1% Lidocaine was used for local anesthesia. Under ultrasound guidance a 6 Fr Safe-T-Centesis catheter was introduced. Thoracentesis was performed. The catheter was removed and a dressing applied. FINDINGS: A total of approximately 1.5 L of amber/blood-tinged fluid was removed. Samples were sent to the laboratory as requested by the clinical team. IMPRESSION: Successful ultrasound guided right thoracentesis yielding 1.5 L of pleural fluid. Read by: Alwyn Ren, NP Electronically Signed   By: Olive Bass M.D.   On: 04/06/2023 16:13   DG Chest Port 1 View  Result Date: 04/06/2023 CLINICAL DATA:  post thoracentesis, large right EXAM: PORTABLE CHEST 1 VIEW COMPARISON:  None Available. FINDINGS: There remains almost complete opacification of the right hemithorax status post thoracentesis. No pneumothorax. There is slight rightward shift of the trachea suggestive of volume loss. There is diffuse reticulonodular opacities of the left lung compatible with known extensive disease of the left lung. No appreciable left pleural effusion. No acute osseous abnormality. IMPRESSION: Persistent almost complete opacification of the right lung compatible with persistent large effusion despite recent thoracentesis. Similar extensive disease of  the left lung. Electronically Signed   By: Olive Bass M.D.   On: 04/06/2023 15:42   CT CHEST W CONTRAST  Result Date: 04/02/2023 CLINICAL DATA:  Pneumonia, weight loss, lymphoma. EXAM: CT CHEST WITH CONTRAST TECHNIQUE: Multidetector CT imaging of the chest was performed during intravenous contrast administration. RADIATION DOSE REDUCTION: This exam was performed according to the departmental dose-optimization program which includes automated exposure control, adjustment of the mA and/or kV according to patient size and/or use of iterative reconstruction technique. CONTRAST:  75mL ISOVUE-300 IOPAMIDOL (ISOVUE-300) INJECTION 61% COMPARISON:  None Available. FINDINGS: Cardiovascular: No pericardial effusion or cardiomegaly. Atheromatous calcifications of the coronary arteries. Mediastinum/Nodes: Right paratracheal node measures 1.3 cm. Mediastinal structures are otherwise unremarkable. Lungs/Pleura: Large right-sided pleural effusion with right lung atelectasis. Small pleural effusion on the left. Diffuse nodular interstitial process on the left consistent with either pulmonary metastatic disease or lymphoma. Upper Abdomen: Left adrenal nodule identified with intermediate attenuation measuring 2.5 cm. A neoplastic etiology cannot be excluded. This could  be further evaluated with a noncontrast CT or MRI. Musculoskeletal: Thoracic degenerative changes. No osteolytic or osteoblastic lesions identified. IMPRESSION: 1. Large right-sided pleural effusion with right lung atelectasis. 2. Small left-sided pleural effusion. 3. Extensive diffuse nodules and interstitial changes in the left lung. This is consistent with a neoplastic etiology, likely lymphoma given the patient's history. 4. Indeterminate left adrenal nodule. Electronically Signed   By: Layla Maw M.D.   On: 04/02/2023 21:02    ASSESSMENT: Stage IV adenocarcinoma of the right lung.  PLAN:    Stage IV adenocarcinoma of the right lung: PET scan  results from April 19, 2023 reviewed independently and reported as above with widespread metastatic disease throughout lungs and bones.  MRI of the brain on April 18, 2023 revealed a 3 mm focus.  Patient will have port, Pleurx catheter, and supraclavicular lymph node biopsy on Monday as scheduled.  He will initiate XRT to his left hip and left skull base next week.  Patient will return to clinic on May 06, 2023 to initiate carboplatinum, Taxol, and Avastin every 21 days for 6 cycles with Udenyca support.  Will reimage with PET scan after cycle 4.  Lymph node biopsy will be sent off for additional molecular testing. Pleural effusion: Thoracentesis x 3 as above.  Pleurx catheter on Monday. Headache/neck pain: Continue fentanyl patch and oxycodone as needed.  Metastatic focus of brain: Only 3 mm.  Monitor and repeat brain MRI in 3 months.  History of diffuse large B-cell lymphoma: Patient completed cycle 6 of R-CHOP in November 2016 at [facility]  By report, PET scan in December 2016 revealed no evidence of disease.   Patient expressed understanding and was in agreement with this plan. He also understands that He can call clinic at any time with any questions, concerns, or complaints.    Cancer Staging  Adenocarcinoma, lung, right [facility]) Staging form: Lung, AJCC 8th Edition - Clinical stage from 04/28/2023: Stage IVB (cTX, cNX, pM1c) - Signed by Jeralyn Ruths, MD on 04/28/2023 Stage prefix: Initial diagnosis   Jeralyn Ruths, MD   04/28/2023 4:17 PM

## 2023-04-28 NOTE — Progress Notes (Signed)
Concerned about weight loss and lack in appetite and would like to know what can be done to help him with appetite.

## 2023-04-28 NOTE — Progress Notes (Signed)
START ON PATHWAY REGIMEN - Non-Small Cell Lung     A cycle is every 21 days:     Paclitaxel      Carboplatin      Bevacizumab-xxxx   **Always confirm dose/schedule in your pharmacy ordering system**  Patient Characteristics: Stage IV Metastatic, Nonsquamous, Awaiting Molecular Test Results and Need to Start Chemotherapy, PS = 0, 1 Therapeutic Status: Stage IV Metastatic Histology: Nonsquamous Cell Broad Molecular Profiling Status: Awaiting Molecular Test Results and Need to Start Chemotherapy ECOG Performance Status: 1 Intent of Therapy: Non-Curative / Palliative Intent, Discussed with Patient 

## 2023-04-29 ENCOUNTER — Other Ambulatory Visit: Payer: Self-pay

## 2023-04-29 ENCOUNTER — Ambulatory Visit: Payer: Managed Care, Other (non HMO) | Admitting: Oncology

## 2023-04-29 ENCOUNTER — Other Ambulatory Visit: Payer: Self-pay | Admitting: Physician Assistant

## 2023-04-29 DIAGNOSIS — Z01818 Encounter for other preprocedural examination: Secondary | ICD-10-CM

## 2023-04-29 NOTE — H&P (Signed)
Chief Complaint: Patient was seen in consultation today for stage IV adenocarcinoma of right lung at the request of Finnegan,Timothy J  Referring Physician(s): Finnegan,Timothy J  Supervising Physician: Marliss Coots  Patient Status: [facility] - Out-pt  History of Present Illness:  Charles Hickman is a 62 y.o. male who underwent CT chest 03/31/2023 after being diagnosed with pneumonia and complaining of weight loss with past medical history of lymphoma.  Imaging showed large right-sided pleural effusion with right lung atelectasis, small left-sided pleural effusion and extensive diffuse nodules and interstitial changes in the left lung consistent with neoplastic ideology.  Patient was referred to oncology and subsequently underwent right thoracentesis that resulted positive for malignancy consistent with metastatic adenocarcinoma of the lung.  PET scan from 04/19/2023 demonstrated widespread metastatic disease throughout the lungs and bones.  MRI brain 04/18/2023 revealed 3 mm focus. Pt was referred to IR for tunneled catheter with port placement, right pleural catheter drain and left supraclavicular lymph node biopsy.  Imaging was reviewed and approved by Dr. Grace Isaac, IR.  Patient denies chills, fever, CP, abdominal pain, N/V or dizziness. He endorses cough, SOB, loss of appetite, fatigue, weight loss, posterior skull pain and weakness. He is n.p.o. per order.   Past Medical History:  Diagnosis Date   Abnormal EKG 11/15/2017   Abnormal stress test    Atrial fibrillation (HCC)    CAD in native artery    a. 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 No EKG changes concerning for ischemia at peak stress or in recovery. Mod to high risk scan; b. LHC 12/15/17: pLAD 75% w/ FFR 0.74 s/p PCI/DES, mRCA 90%, CTO dRCA w/ L-R colatts, EF 45-50%, inf HK    Diabetes mellitus  (HCC)    HCV (hepatitis C virus)    HLD (hyperlipidemia)    Hypertension    Large cell lymphoma (HCC)    a. s/p 6 cycles of R-CHOP   Peripheral neuropathy    Stroke [facility])    Systolic dysfunction    a. TTE 8/16: EF > 55%, aortic sclerosis, normal RV systolic function; b. LHC 12/15/17: EF 45-50%, inf HK    Past Surgical History:  Procedure Laterality Date   APPENDECTOMY     CORONARY PRESSURE/FFR STUDY N/A 12/15/2017   Procedure: INTRAVASCULAR PRESSURE WIRE/FFR STUDY;  Surgeon: Iran Ouch, MD;  Location: [facility] INVASIVE CV LAB;  Service: Cardiovascular;  Laterality: N/A;   LEFT HEART CATH AND CORONARY ANGIOGRAPHY N/A 12/15/2017   Procedure: LEFT HEART CATH AND CORONARY ANGIOGRAPHY;  Surgeon: Iran Ouch, MD;  Location: [facility] INVASIVE CV LAB;  Service: Cardiovascular;  Laterality: N/A;   TONSILLECTOMY AND ADENOIDECTOMY      Allergies: Patient has no known allergies.  Medications: Prior to Admission medications   Medication Sig Start Date End Date Taking? Authorizing Provider  apixaban (ELIQUIS) 5 MG TABS tablet TAKE 1 TABLET BY MOUTH TWICE A DAY 04/12/23   Antonieta Iba, MD  blood glucose meter kit and supplies KIT Dispense based on patient and insurance preference. Use 2 times daily as directed. (FOR ICD-10 E11.9). 09/29/20   Glori Luis, MD  diltiazem (CARDIZEM) 30 MG tablet TAKE 1 TABLET (30 MG TOTAL) BY MOUTH 3 (THREE) TIMES DAILY AS NEEDED. 07/26/22   Antonieta Iba, MD  Dulaglutide 0.75 MG/0.5ML SOPN Inject 0.75 mg into the skin  once a week. 03/03/23   [provider]  fentaNYL (DURAGESIC) 25 MCG/HR Place 1 patch onto the skin every 3 (three) days. 04/21/23   Jeralyn Ruths, MD  JARDIANCE 25 MG TABS tablet TAKE 1 TABLET BY MOUTH EVERY DAY 02/22/22   Worthy Rancher B, FNP  lidocaine-prilocaine (EMLA) cream Apply to affected area once 04/28/23   Jeralyn Ruths, MD  metoprolol succinate (TOPROL-XL) 50 MG 24 hr tablet Take 1 tablet (50 mg total) by  mouth daily. TAKE WITH OR IMMEDIATELY FOLLOWING A MEAL. 05/28/22   Gollan, Tollie Pizza, MD  nitroGLYCERIN (NITROSTAT) 0.4 MG SL tablet Place 1 tablet (0.4 mg total) under the tongue every 5 (five) minutes as needed for chest pain. 05/28/22   Antonieta Iba, MD  ondansetron (ZOFRAN) 8 MG tablet Take 1 tablet (8 mg total) by mouth every 8 (eight) hours as needed for nausea or vomiting. Start on the third day after carboplatin. 04/28/23   Jeralyn Ruths, MD  ONETOUCH VERIO test strip USE TWICE A DAY AS DIRECTED 11/20/20   Glori Luis, MD  oxyCODONE (OXY IR/ROXICODONE) 5 MG immediate release tablet Take 1 tablet (5 mg total) by mouth every 6 (six) hours as needed for severe pain. 04/12/23   Jeralyn Ruths, MD  prochlorperazine (COMPAZINE) 10 MG tablet Take 1 tablet (10 mg total) by mouth every 6 (six) hours as needed for nausea or vomiting. 04/28/23   Jeralyn Ruths, MD  rosuvastatin (CRESTOR) 10 MG tablet Take 1 tablet (10 mg total) by mouth daily. 05/28/22   Antonieta Iba, MD     Family History  Problem Relation Age of Onset   Hypertension Mother    Hyperlipidemia Mother    Stroke Mother    Diabetes Mother    Kidney disease Father    Hypertension Father    Diabetes Father    Dementia Maternal Grandmother     Social History   Socioeconomic History   Marital status: Married    Spouse name: Not on file   Number of children: Not on file   Years of education: Not on file   Highest education level: Not on file  Occupational History   Not on file  Tobacco Use   Smoking status: Every Day    Packs/day: .5    Types: Cigarettes   Smokeless tobacco: Never   Tobacco comments:    Down to a couple of cigarettes a day  Substance and Sexual Activity   Alcohol use: Yes    Comment: occasional   Drug use: No   Sexual activity: Not on file  Other Topics Concern   Not on file  Social History Narrative   Not on file   Social Determinants of Health   Financial Resource Strain:  Low Risk  (04/06/2023)   Overall Financial Resource Strain (CARDIA)    Difficulty of Paying Living Expenses: Not hard at all  Food Insecurity: No Food Insecurity (04/06/2023)   Hunger Vital Sign    Worried About Running Out of Food in the Last Year: Never true    Ran Out of Food in the Last Year: Never true  Transportation Needs: No Transportation Needs (04/06/2023)   PRAPARE - Administrator, Civil Service (Medical): No    Lack of Transportation (Non-Medical): No  Physical Activity: Not on file  Stress: Not on file  Social Connections: Not on file    Review of Systems: A 12 point ROS discussed and pertinent positives  are indicated in the HPI above.  All other systems are negative.  Review of Systems  Constitutional:  Positive for appetite change, fatigue and unexpected weight change. Negative for chills and fever.  Respiratory:  Positive for cough and shortness of breath.   Cardiovascular:  Negative for chest pain.  Gastrointestinal:  Negative for abdominal pain, nausea and vomiting.  Neurological:  Positive for weakness and headaches. Negative for dizziness.       Posterior skull pain     Vital Signs: BP 115/78   Pulse 96   Temp 98 F (36.7 C) (Oral)   Resp (!) 22   Ht 5\' 10"  (1.778 m)   Wt 168 lb (76.2 kg)   SpO2 93%   BMI 24.11 kg/m     Physical Exam Vitals reviewed.  Constitutional:      General: He is not in acute distress.    Appearance: He is ill-appearing.  HENT:     Head: Normocephalic and atraumatic.     Mouth/Throat:     Mouth: Mucous membranes are dry.     Pharynx: Oropharynx is clear.  Eyes:     Extraocular Movements: Extraocular movements intact.     Pupils: Pupils are equal, round, and reactive to light.  Cardiovascular:     Rate and Rhythm: Normal rate and regular rhythm.     Pulses: Normal pulses.     Heart sounds: Normal heart sounds.  Pulmonary:     Effort: Pulmonary effort is normal. No respiratory distress.     Comments:  Diminished RUL RLL breath sounds absent Abdominal:     General: Bowel sounds are normal. There is no distension.     Palpations: Abdomen is soft.     Tenderness: There is no abdominal tenderness. There is no guarding.  Musculoskeletal:     Right lower leg: No edema.     Left lower leg: No edema.  Skin:    General: Skin is warm and dry.  Neurological:     Mental Status: He is alert and oriented to person, place, and time.  Psychiatric:        Mood and Affect: Mood normal.        Behavior: Behavior normal.        Thought Content: Thought content normal.        Judgment: Judgment normal.     Imaging: US Venous Img Lower Unilateral Left  Result Date: 04/28/2023 CLINICAL DATA:  Leg swelling EXAM: Left LOWER EXTREMITY VENOUS DOPPLER ULTRASOUND TECHNIQUE: Gray-scale sonography with compression, as well as color and duplex ultrasound, were performed to evaluate the deep venous system(s) from the level of the common femoral vein through the popliteal and proximal calf veins. COMPARISON:  None Available. FINDINGS: VENOUS Normal compressibility of the common femoral, superficial femoral, and popliteal veins, as well as the visualized calf veins. Visualized portions of profunda femoral vein and great saphenous vein unremarkable. No filling defects to suggest DVT on grayscale or color Doppler imaging. Doppler waveforms show normal direction of venous flow, normal respiratory plasticity and response to augmentation. Limited views of the contralateral common femoral vein are unremarkable. OTHER There is slow flow seen in the left popliteal vein. Limitations: none IMPRESSION: No evidence of left lower extremity DVT Electronically Signed   By: Karen Kays M.D.   On: 04/28/2023 15:58   MR Brain W Wo Contrast  Result Date: 04/22/2023 CLINICAL DATA:  Initial staging for lung nodules. Headaches. History of lymphoma. EXAM: MRI HEAD WITHOUT AND WITH CONTRAST  TECHNIQUE: Multiplanar, multiecho pulse sequences of  the brain and surrounding structures were obtained without and with intravenous contrast. CONTRAST:  8.5 mL Vueway COMPARISON:  Head MRI 06/01/2011.  PET-CT 04/19/2023. FINDINGS: Brain: There is no evidence of an acute infarct, intracranial hemorrhage, midline shift, or extra-axial fluid collection. Scattered small T2 hyperintensities in the cerebral white matter have mildly progressed from 2012 and are nonspecific but compatible with mild chronic small vessel ischemic disease. A chronic lacunar infarct is noted in the right corona radiata. The ventricles and sulci are within normal limits for age. A cavum septum pellucidum et vergae is incidentally noted. There is a 3 mm round enhancing lesion in the left cerebellar hemisphere without edema (series 119, image 31). Vascular: Major intracranial vascular flow voids are preserved. Skull and upper cervical spine: As seen on PET-CT, there is an approximately 5 cm destructive mass centered in the left occipital bone at the skull base. There is associated dural thickening and enhancement overlying the inferior and lateral aspects of the left cerebellar hemisphere. Tumor involves the left hypoglossal canal and jugular foramen. There is a separate 1 cm lesion in the clivus. Sinuses/Orbits: Unremarkable orbits. Moderate mucosal thickening and minimal fluid in the left maxillary sinus. Moderate left mastoid fluid. Other: None. IMPRESSION: 1. 3 mm enhancing lesion in the left cerebellar hemisphere suspicious for a metastasis. 2. 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 3. Additional small lesion in the clivus. Electronically Signed   By: Sebastian Ache M.D.   On: 04/22/2023 08:05   NM PET Image Initial (PI) Skull Base To Thigh  Result Date: 04/20/2023 CLINICAL DATA:  Initial treatment strategy for pulmonary nodules. Prior history of lymphoma. EXAM: NUCLEAR MEDICINE PET SKULL BASE TO THIGH TECHNIQUE: 9.22 mCi F-18 FDG was injected intravenously. Full-ring PET  imaging was performed from the skull base to thigh after the radiotracer. CT data was obtained and used for attenuation correction and anatomic localization. Fasting blood glucose: 113 mg/dl COMPARISON:  Chest CT 54/27/0623 FINDINGS: Mediastinal blood pool activity: SUV max 2.43 Liver activity: SUV max 2.70 NECK: No neck mass or cervical lymphadenopathy. Incidental CT findings: None. CHEST: Small lymph node noted in the left supraclavicular fossa with SUV max of 2.3. There are also small supraclavicular and subclavicular lymph nodes on the left side which are hypermetabolic. SUV max is 2.86 The right lung is completely obstructed/drowned with a abrupt cut off of the right mainstem bronchus. There are numerous areas of hypermetabolism throughout the obstructed right lung. Right upper lobe hypermetabolism near the bronchial cut has an SUV max of 6.59. Findings consistent with a probable primary lung neoplasm and diffuse pulmonary metastatic disease. There is also a large right pleural effusion and multiple hypermetabolic pleural lesions. The recent thoracentesis may yield a pathologic diagnosis. There is also mediastinal lymphadenopathy which is hypermetabolic. The largest upper right mediastinal disease measures a maximum of 23 mm and the SUV max is 5.70. Diffuse pulmonary nodules throughout the aerated left lung. The larger nodules demonstrate hypermetabolism in her consistent with metastatic disease. Incidental CT findings: The heart is normal in size. No pericardial effusion. There are age advanced aortic and three-vessel coronary artery calcifications. ABDOMEN/PELVIS: No findings to suggest hepatic metastatic disease. There is a 2.2 cm left adrenal gland lesion demonstrating mild hypermetabolism with SUV max of 3.76 which is likely a metastatic focus. No enlarged or hypermetabolic mesenteric or retroperitoneal nodes to suggest recurrent lymphoma or metastatic adenopathy. No pelvic adenopathy. Incidental CT  findings: Significant age advanced  atherosclerotic calcification involving the aorta and branch vessels but no aneurysm. SKELETON: Diffuse osseous metastatic disease. There is a large destructive lesion involving the left occipital bone at the skull base with SUV max of 6.53. Left-sided sternal lesion has an SUV max of 5.87. Left fifth anterior rib has an SUV max of 6.32. Multiple vertebral body lesions without spinal canal compromise. T9 lesion has an SUV max of 4.89. L1 lesion has an SUV max of 6.56. Large destructive central sacral lesion has an SUV max of 5.50. Large destructive left hip lesion has an SUV max of 4.75. Findings highly worrisome for impending fracture. Right hip lesion at the level of the lesser trochanter has an SUV max of 5.22. Incidental CT findings: None. IMPRESSION: 1. Obstructed/drowned right lung with numerous areas of hypermetabolism likely due to a primary lung neoplasm. 2. Large right pleural effusion with multiple hypermetabolic pleural lesions. The recent thoracentesis may yield a pathologic diagnosis. If not, biopsy of a left supraclavicular node may be the easiest and safest tissue diagnosis. 3. Diffuse pulmonary metastatic disease. 4. Hypermetabolic supraclavicular and mediastinal lymphadenopathy. 5. Left adrenal gland lesion is likely a metastatic lesion. 6. Diffuse osseous metastatic disease. The left hip lesion is highly worrisome for impending fracture. Large destructive lesion at the left skull base. Vertebral body lesions but no spinal canal compromise. 7. Age advanced atherosclerotic calcification involving the aorta and three-vessel coronary artery calcifications. Aortic Atherosclerosis (ICD10-I70.0). Electronically Signed   By: Rudie Meyer M.D.   On: 04/20/2023 16:38   US THORACENTESIS ASP PLEURAL SPACE W/IMG GUIDE  Result Date: 04/20/2023 INDICATION: History of lymphoma found to have right-sided recurrent pleural effusion. Request received for diagnostic and  therapeutic right thoracentesis. EXAM: ULTRASOUND GUIDED DIAGNOSTIC AND THERAPEUTIC RIGHT THORACENTESIS MEDICATIONS: 10 mL 1 % lidocaine COMPLICATIONS: None immediate. PROCEDURE: An ultrasound guided thoracentesis was thoroughly discussed with the patient and questions answered. The benefits, risks, alternatives and complications were also discussed. The patient understands and wishes to proceed with the procedure. Written consent was obtained. Ultrasound was performed to localize and mark an adequate pocket of fluid in the right chest. The area was then prepped and draped in the normal sterile fashion. 1% Lidocaine was used for local anesthesia. Under ultrasound guidance a 6 Fr Safe-T-Centesis catheter was introduced. Thoracentesis was performed. The catheter was removed and a dressing applied. FINDINGS: A total of approximately 1.2 L of hazy, amber fluid was removed. Samples were sent to the laboratory as requested by the clinical team. IMPRESSION: Successful ultrasound guided right thoracentesis yielding 1.2 L of pleural fluid. Read by: Alex Gardener, AGNP-BC Electronically Signed   By: Olive Bass M.D.   On: 04/20/2023 16:16   DG Chest Port 1 View  Result Date: 04/20/2023 CLINICAL DATA:  Right pleural effusion.  Status post thoracentesis. EXAM: PORTABLE CHEST 1 VIEW COMPARISON:  Chest x-ray 04/11/2023 and recent PET-CT 04/19/2023. FINDINGS: Persistent complete opacification of the right hemithorax with a bronchial cut off sign suggesting diffuse drowned/obstructed right lung. I do not see any obvious hypermetabolic central lung mass on the PET-CT. Recommend bronchoscopic evaluation. The left lung demonstrates diffuse interstitial and airspace process and innumerable pulmonary nodules. IMPRESSION: Persistent complete opacification of the right hemithorax despite the thoracentesis with a bronchial cut off sign suggesting diffuse drowned/obstructed right lung. Recommend bronchoscopic evaluation. Electronically  Signed   By: Rudie Meyer M.D.   On: 04/20/2023 16:13   US THORACENTESIS ASP PLEURAL SPACE W/IMG GUIDE  Result Date: 04/11/2023 INDICATION: Patient with history of  lymphoma. Found to have right-sided pleural effusion with diffuse left lobe lung nodules consistent with underlying malignancy yet to be determined. Patient presents for therapeutic and diagnostic thoracentesis EXAM: ULTRASOUND GUIDED THERAPEUTIC AND DIAGNOSTIC THORACENTESIS MEDICATIONS: Lidocaine 1% 10 mL COMPLICATIONS: None immediate. PROCEDURE: An ultrasound guided thoracentesis was thoroughly discussed with the patient and questions answered. The benefits, risks, alternatives and complications were also discussed. The patient understands and wishes to proceed with the procedure. Written consent was obtained. Ultrasound was performed to localize and mark an adequate pocket of fluid in the right chest. The area was then prepped and draped in the normal sterile fashion. 1% Lidocaine was used for local anesthesia. Under ultrasound guidance a 6 Fr Safe-T-Centesis catheter was introduced. Thoracentesis was performed. The catheter was removed and a dressing applied. FINDINGS: A total of approximately 1 L of amber color fluid was removed. Samples were sent to the laboratory as requested by the clinical team. IMPRESSION: Successful ultrasound guided right-sided therapeutic and diagnostic thoracentesis yielding 1 L of pleural fluid. Read by: Anders Grant, NP Electronically Signed   By: Malachy Moan M.D.   On: 04/11/2023 16:10   DG Chest Port 1 View  Result Date: 04/11/2023 CLINICAL DATA:  Right pleural effusion status post thoracentesis. 1 L of fluid drained from right pleural space earlier today. EXAM: PORTABLE CHEST 1 VIEW COMPARISON:  AP chest 04/06/2023, chest two views 11/15/2017; CT chest 03/31/2023 FINDINGS: There is again complete homogeneous opacification of the right hemithorax. Mild rightward mediastinal shift is similar to  04/06/2023 and 03/31/2023 CT, likely due to the compressive atelectasis associated with a large right pleural effusion. Diffuse left lung moderate interstitial thickening and reticulonodular opacities are similar to prior. No definite left pleural effusion, noting no pleural effusion was seen on 04/06/2023 radiographs but a small pleural effusion was seen on 03/31/2023 CT. No pneumothorax. Visualized cardiac silhouette and mediastinal contours are unremarkable. Mild-to-moderate atherosclerotic calcification within the aortic arch. No acute skeletal abnormality. IMPRESSION: 1. Complete homogeneous opacification of the right hemithorax, similar to 04/06/2023 and 03/31/2023 CT, likely due to the compressive atelectasis associated with persistent large right pleural effusion. Note is made of right-sided thoracentesis earlier today, and the persistent right hemithorax homogeneous opacification is similar to that seen on 04/06/2023 radiograph following thoracentesis earlier that day as well. 2. Diffuse left lung moderate interstitial thickening and reticulonodular opacities, similar to prior. Appearance on prior CT was suggestive of possible neoplastic etiology including lymphoma given patient history of lymphoma. Electronically Signed   By: Neita Garnet M.D.   On: 04/11/2023 15:45   US THORACENTESIS ASP PLEURAL SPACE W/IMG GUIDE  Result Date: 04/06/2023 INDICATION: Patient with a history of right pleural effusion and diffuse left lung nodules consistent with underlying malignancy. Patient also has a history of lymphoma. Interventional radiology asked to perform a diagnostic and therapeutic thoracentesis. EXAM: ULTRASOUND GUIDED THORACENTESIS MEDICATIONS: 1% lidocaine 10 mL COMPLICATIONS: None immediate. PROCEDURE: An ultrasound guided thoracentesis was thoroughly discussed with the patient and questions answered. The benefits, risks, alternatives and complications were also discussed. The patient understands and  wishes to proceed with the procedure. Written consent was obtained. Ultrasound was performed to localize and mark an adequate pocket of fluid in the right chest. The area was then prepped and draped in the normal sterile fashion. 1% Lidocaine was used for local anesthesia. Under ultrasound guidance a 6 Fr Safe-T-Centesis catheter was introduced. Thoracentesis was performed. The catheter was removed and a dressing applied. FINDINGS: A total of approximately 1.5 L  of amber/blood-tinged fluid was removed. Samples were sent to the laboratory as requested by the clinical team. IMPRESSION: Successful ultrasound guided right thoracentesis yielding 1.5 L of pleural fluid. Read by: Alwyn Ren, NP Electronically Signed   By: Olive Bass M.D.   On: 04/06/2023 16:13   DG Chest Port 1 View  Result Date: 04/06/2023 CLINICAL DATA:  post thoracentesis, large right EXAM: PORTABLE CHEST 1 VIEW COMPARISON:  None Available. FINDINGS: There remains almost complete opacification of the right hemithorax status post thoracentesis. No pneumothorax. There is slight rightward shift of the trachea suggestive of volume loss. There is diffuse reticulonodular opacities of the left lung compatible with known extensive disease of the left lung. No appreciable left pleural effusion. No acute osseous abnormality. IMPRESSION: Persistent almost complete opacification of the right lung compatible with persistent large effusion despite recent thoracentesis. Similar extensive disease of the left lung. Electronically Signed   By: Olive Bass M.D.   On: 04/06/2023 15:42    Labs:  CBC: Recent Labs    05/28/22 0931 04/06/23 1111  WBC 7.8 9.7  HGB 15.5 14.5  HCT 46.5 46.1  PLT 202 329    COAGS: No results for input(s): "INR", "APTT" in the last 8760 hours.  BMP: Recent Labs    05/28/22 0931 04/06/23 1111  NA 140 136  K 4.2 4.4  CL 105 101  CO2 29 25  GLUCOSE 219* 197*  BUN 14 15  CALCIUM 9.3 9.4  CREATININE 0.79  0.84  GFRNONAA >60 >60    LIVER FUNCTION TESTS: Recent Labs    04/06/23 1111  BILITOT 0.5  AST 17  ALT 15  ALKPHOS 90  PROT 8.0  ALBUMIN 3.8    TUMOR MARKERS: No results for input(s): "AFPTM", "CEA", "CA199", "CHROMGRNA" in the last 8760 hours.  Assessment and Plan:  62 year old male with PMHx significant for large cell lymphoma, CVA, A-fib, CAD, DM type II, HCV, HLD, HTN and recently diagnosed adenocarcinoma of the lung presents to IR for: 1.  Tunneled catheter with port placement 2.  Right pleural drain placement 3.  Left supraclavicular lymph node biopsy.  Patient resting on stretcher with wife at bedside. He is alert and oriented, calm and pleasant. He is in no distress.  Risks and benefits of image guided tunneled catheter with port placement with moderate sedation was discussed with the patient including, but not limited to bleeding, infection, pneumothorax, or fibrin sheath development and need for additional procedures.  Risks and benefits of pleural drain placement discussed with the patient including bleeding, infection, damage to adjacent structures and sepsis.  Risks and benefits of left supraclavicular lymph node biopsy was discussed with the patient including, but not limited to bleeding, infection, damage to adjacent structures or low yield requiring additional tests.  All of the questions were answered and there is agreement to proceed.  Consent signed and in chart.  Thank you for this interesting consult.  I greatly enjoyed meeting Charles Hickman and look forward to participating in their care.  A copy of this report was sent to the requesting provider on this date.  Electronically Signed: Shon Hough, NP 05/02/2023, 8:16 AM   I spent a total of 20 minutes in face to face in clinical consultation, greater than 50% of which was counseling/coordinating care for stage IV adenocarcinoma of right lung.

## 2023-04-29 NOTE — Progress Notes (Signed)
Patient for IR LT Supraclavicular LN Bx/IR Port Placment/IR RT sided Pleur-x Cath Placement on Mon 05/02/2023, I called and spoke with the patient on the phone and gave pre-procedure instructions. Pt was made aware to be here at 7:30a, NPO after MN prior to procedure as well as driver post procedure/recovery/discharge. Pt stated understanding.  Called 04/29/2023 PT does not need to hold his Eliquis for these procedures.

## 2023-05-02 ENCOUNTER — Other Ambulatory Visit: Payer: Self-pay | Admitting: Oncology

## 2023-05-02 ENCOUNTER — Ambulatory Visit
Admission: RE | Admit: 2023-05-02 | Discharge: 2023-05-02 | Disposition: A | Discharge status: Home / Self Care | Payer: Managed Care, Other (non HMO) | Source: Ambulatory Visit | Attending: Internal Medicine | Admitting: Internal Medicine

## 2023-05-02 ENCOUNTER — Other Ambulatory Visit: Payer: Self-pay

## 2023-05-02 ENCOUNTER — Ambulatory Visit
Admission: RE | Admit: 2023-05-02 | Discharge: 2023-05-02 | Disposition: A | Discharge status: Home / Self Care | Payer: Managed Care, Other (non HMO) | Source: Ambulatory Visit | Attending: Oncology | Admitting: Oncology

## 2023-05-02 ENCOUNTER — Encounter: Payer: Self-pay | Admitting: Radiology

## 2023-05-02 VITALS — BP 103/79 | HR 96 | Temp 98.0°F | Resp 22 | Ht 70.0 in | Wt 168.0 lb

## 2023-05-02 DIAGNOSIS — J90 Pleural effusion, not elsewhere classified: Secondary | ICD-10-CM

## 2023-05-02 DIAGNOSIS — R911 Solitary pulmonary nodule: Secondary | ICD-10-CM | POA: Diagnosis present

## 2023-05-02 DIAGNOSIS — M25552 Pain in left hip: Secondary | ICD-10-CM

## 2023-05-02 DIAGNOSIS — C773 Secondary and unspecified malignant neoplasm of axilla and upper limb lymph nodes: Secondary | ICD-10-CM | POA: Insufficient documentation

## 2023-05-02 DIAGNOSIS — J910 Malignant pleural effusion: Secondary | ICD-10-CM | POA: Diagnosis not present

## 2023-05-02 DIAGNOSIS — R591 Generalized enlarged lymph nodes: Secondary | ICD-10-CM

## 2023-05-02 DIAGNOSIS — C3491 Malignant neoplasm of unspecified part of right bronchus or lung: Secondary | ICD-10-CM | POA: Diagnosis not present

## 2023-05-02 DIAGNOSIS — Z01818 Encounter for other preprocedural examination: Secondary | ICD-10-CM

## 2023-05-02 DIAGNOSIS — F17210 Nicotine dependence, cigarettes, uncomplicated: Secondary | ICD-10-CM | POA: Diagnosis not present

## 2023-05-02 HISTORY — PX: IR IMAGING GUIDED PORT INSERTION: IMG5740

## 2023-05-02 HISTORY — PX: IR US GUIDE BX ASP/DRAIN: IMG2392

## 2023-05-02 HISTORY — PX: IR GUIDED DRAIN W CATHETER PLACEMENT: IMG719

## 2023-05-02 LAB — CBC
HCT: 43.3 % (ref 39.0–52.0)
Hemoglobin: 13.4 g/dL (ref 13.0–17.0)
MCH: 27.1 pg (ref 26.0–34.0)
MCHC: 30.9 g/dL (ref 30.0–36.0)
MCV: 87.5 fL (ref 80.0–100.0)
Platelets: 316 10*3/uL (ref 150–400)
RBC: 4.95 MIL/uL (ref 4.22–5.81)
RDW: 15.9 % — ABNORMAL HIGH (ref 11.5–15.5)
WBC: 10 10*3/uL (ref 4.0–10.5)
nRBC: 0 % (ref 0.0–0.2)

## 2023-05-02 LAB — GLUCOSE, CAPILLARY: Glucose-Capillary: 174 mg/dL — ABNORMAL HIGH (ref 70–99)

## 2023-05-02 LAB — PROTIME-INR
INR: 1.2 (ref 0.8–1.2)
Prothrombin Time: 15.8 seconds — ABNORMAL HIGH (ref 11.4–15.2)

## 2023-05-02 MED ORDER — CEFAZOLIN SODIUM-DEXTROSE 2-4 GM/100ML-% IV SOLN
INTRAVENOUS | Status: AC
  Filled 2023-05-02: qty 100

## 2023-05-02 MED ORDER — LIDOCAINE-EPINEPHRINE 1 %-1:100000 IJ SOLN
INTRAMUSCULAR | Status: AC
  Filled 2023-05-02: qty 1

## 2023-05-02 MED ORDER — LIDOCAINE-EPINEPHRINE 1 %-1:100000 IJ SOLN
20.0000 mL | Freq: Once | INTRAMUSCULAR | Status: AC
  Administered 2023-05-02: 20 mL

## 2023-05-02 MED ORDER — SODIUM CHLORIDE 0.9 % IV SOLN: INTRAVENOUS | Status: DC

## 2023-05-02 MED ORDER — HEPARIN SOD (PORK) LOCK FLUSH 100 UNIT/ML IV SOLN
INTRAVENOUS | Status: AC
  Filled 2023-05-02: qty 5

## 2023-05-02 MED ORDER — FENTANYL CITRATE (PF) 100 MCG/2ML IJ SOLN
INTRAMUSCULAR | Status: AC | PRN
  Administered 2023-05-02: 50 ug via INTRAVENOUS
  Administered 2023-05-02 (×2): 25 ug via INTRAVENOUS

## 2023-05-02 MED ORDER — LIDOCAINE HCL (PF) 1 % IJ SOLN
15.0000 mL | Freq: Once | INTRAMUSCULAR | Status: AC
  Administered 2023-05-02: 15 mL

## 2023-05-02 MED ORDER — CEFAZOLIN SODIUM-DEXTROSE 2-4 GM/100ML-% IV SOLN
2.0000 g | Freq: Once | INTRAVENOUS | Status: AC
  Administered 2023-05-02: 2 g via INTRAVENOUS

## 2023-05-02 MED ORDER — MIDAZOLAM HCL 2 MG/2ML IJ SOLN
INTRAMUSCULAR | Status: AC | PRN
  Administered 2023-05-02 (×2): 1 mg via INTRAVENOUS

## 2023-05-02 MED ORDER — FENTANYL CITRATE (PF) 100 MCG/2ML IJ SOLN
INTRAMUSCULAR | Status: AC
  Filled 2023-05-02: qty 2

## 2023-05-02 MED ORDER — HEPARIN SOD (PORK) LOCK FLUSH 100 UNIT/ML IV SOLN
500.0000 [IU] | Freq: Once | INTRAVENOUS | Status: AC
  Administered 2023-05-02: 500 [IU] via INTRAVENOUS

## 2023-05-02 MED ORDER — LIDOCAINE HCL 1 % IJ SOLN
INTRAMUSCULAR | Status: AC
  Filled 2023-05-02: qty 20

## 2023-05-02 MED ORDER — MIDAZOLAM HCL 2 MG/2ML IJ SOLN
INTRAMUSCULAR | Status: AC
  Filled 2023-05-02: qty 4

## 2023-05-02 NOTE — Progress Notes (Signed)
CXR done, patient can go home per Dr.Suttle.

## 2023-05-02 NOTE — Progress Notes (Signed)
Charles Hickman came in on room air and sats were 91-93%. After the procedure, he has been on 2L via nasal cannula. Took the O2 off to see how he does on room air before he leaves and sats dropped to 86%. Had to put the O2 back on him to get to 90%. Now he is 93% on 2L.  Provider notified.

## 2023-05-02 NOTE — Progress Notes (Signed)
Received message from RN stating pt dropped to 86% on RA. Pt was placed on 2L O2 via [place]. Stat CXR ordered.     Alex Gardener, AGNP-BC 05/02/2023, 11:55 AM

## 2023-05-02 NOTE — Sedation Documentation (Signed)
Pt. Tolerated all procedures (x3) very well. PleurX drained off 1600 ml as of now.

## 2023-05-02 NOTE — Procedures (Addendum)
Interventional Radiology Procedure Note  Procedure:  1) Right IJ single lumen port placement 2) Right tunneled pleural drainage catheter placement 3) Ultrasound guided left supraclavicular lymph node biopsy   Findings: Please refer to procedural dictation for full description. R IJ single lumen port, ready for immediate use.  Right tunneled Pleurex placed, drained ~1.6 L dark port-colored fluid.  18 ga core biopsy of left supraclavicular lymph node x3.  Complications: None immediate  Estimated Blood Loss: < 5 mL  Recommendations: Port ready for use as needed. Pleurex ready for use as needed.   Marliss Coots, MD

## 2023-05-03 ENCOUNTER — Encounter: Payer: Self-pay | Admitting: *Deleted

## 2023-05-03 ENCOUNTER — Ambulatory Visit
Admission: RE | Admit: 2023-05-03 | Discharge: 2023-05-03 | Disposition: A | Discharge status: Home / Self Care | Payer: Managed Care, Other (non HMO) | Source: Ambulatory Visit | Attending: Radiation Oncology | Admitting: Radiation Oncology

## 2023-05-03 DIAGNOSIS — Z510 Encounter for antineoplastic radiation therapy: Secondary | ICD-10-CM | POA: Diagnosis not present

## 2023-05-03 NOTE — Progress Notes (Signed)
Met with patient after radiation today to assist with first pleurx catheter drainage. Pt and wife would like a few assisted drainage sessions before attempting at home by themselves. Pleurx catheter dressing was clean, dry, and intact prior to remove. After dressing removal, site was clean, dry, and without signs of infection. Successful drainage of dark red fluid removed from pleurx catheter. Pt tolerated drainage well and had some chest tightness at the end when drainage was stopped. Pt's symptoms resolved and he said he was feeling better prior to leaving the clinic. Instructed pt to write down is drainage amount on his chart at home to keep track of drainage removed over a period of time. Informed pt that will drain again tomorrow at his radiation appt and will have wife assist with drainage. Pt and wife verbalized understanding. Nothing further needed at this time.

## 2023-05-04 ENCOUNTER — Other Ambulatory Visit: Payer: Self-pay | Admitting: *Deleted

## 2023-05-04 ENCOUNTER — Inpatient Hospital Stay: Payer: Managed Care, Other (non HMO) | Admitting: Hospice and Palliative Medicine

## 2023-05-04 ENCOUNTER — Other Ambulatory Visit: Payer: Self-pay

## 2023-05-04 ENCOUNTER — Encounter: Payer: Self-pay | Admitting: *Deleted

## 2023-05-04 ENCOUNTER — Ambulatory Visit
Admission: RE | Admit: 2023-05-04 | Discharge: 2023-05-04 | Disposition: A | Discharge status: Home / Self Care | Payer: Managed Care, Other (non HMO) | Source: Ambulatory Visit | Attending: Radiation Oncology | Admitting: Radiation Oncology

## 2023-05-04 DIAGNOSIS — C3491 Malignant neoplasm of unspecified part of right bronchus or lung: Secondary | ICD-10-CM

## 2023-05-04 DIAGNOSIS — Z510 Encounter for antineoplastic radiation therapy: Secondary | ICD-10-CM | POA: Diagnosis not present

## 2023-05-04 LAB — RAD ONC ARIA SESSION SUMMARY
Course Elapsed Days: 0
Plan Fractions Treated to Date: 1
Plan Prescribed Dose Per Fraction: 3 Gy
Plan Total Fractions Prescribed: 10
Plan Total Prescribed Dose: 30 Gy
Reference Point Dosage Given to Date: 3 Gy
Reference Point Session Dosage Given: 3 Gy
Session Number: 1

## 2023-05-04 LAB — SURGICAL PATHOLOGY

## 2023-05-04 NOTE — Progress Notes (Signed)
Met with patient to provide assistance with pleurx drainage. Pt's wife was able to complete drainage of pleurx catheter with verbal guidance. Pt tolerated the procedure well. Catheter site and dressing were clean, dry, and intact. Successful drainage of of dark red fluid removed. Pt requests to have catheter drained with some guidance again tomorrow after chemo class. Will follow up with pt and his wife at that time to assist with drainage.

## 2023-05-04 NOTE — Progress Notes (Signed)
Multidisciplinary Oncology Council Documentation  Charles Hickman was presented by our [facility] on 05/04/2023, which included representatives from:  Palliative Care Dietitian  Physical/Occupational Therapist Nurse Navigator Genetics Speech Therapist Social work Survivorship RN Financial Navigator Research RN   Charles Hickman currently presents with history of lung cancer  We reviewed previous medical and familial history, history of present illness, and recent lab results along with all available histopathologic and imaging studies. The MOC considered available treatment options and made the following recommendations/referrals:  Consider palliative care, nutrition  The MOC is a meeting of clinicians from various specialty areas who evaluate and discuss patients for whom a multidisciplinary approach is being considered. Final determinations in the plan of care are those of the provider(s).   Today's extended care, comprehensive team conference, Charles Hickman was not present for the discussion and was not examined.

## 2023-05-05 ENCOUNTER — Other Ambulatory Visit: Payer: Self-pay | Admitting: *Deleted

## 2023-05-05 ENCOUNTER — Other Ambulatory Visit: Payer: Self-pay

## 2023-05-05 ENCOUNTER — Other Ambulatory Visit: Payer: Managed Care, Other (non HMO)

## 2023-05-05 ENCOUNTER — Inpatient Hospital Stay: Payer: Managed Care, Other (non HMO)

## 2023-05-05 ENCOUNTER — Encounter: Payer: Self-pay | Admitting: *Deleted

## 2023-05-05 ENCOUNTER — Ambulatory Visit
Admission: RE | Admit: 2023-05-05 | Discharge: 2023-05-05 | Disposition: A | Discharge status: Home / Self Care | Payer: Managed Care, Other (non HMO) | Source: Ambulatory Visit | Attending: Radiation Oncology | Admitting: Radiation Oncology

## 2023-05-05 DIAGNOSIS — C3491 Malignant neoplasm of unspecified part of right bronchus or lung: Secondary | ICD-10-CM

## 2023-05-05 DIAGNOSIS — Z510 Encounter for antineoplastic radiation therapy: Secondary | ICD-10-CM | POA: Diagnosis not present

## 2023-05-05 LAB — URINALYSIS, DIPSTICK ONLY
Bilirubin Urine: NEGATIVE
Glucose, UA: >=500 mg/dL — AB
Hgb urine dipstick: NEGATIVE
Ketones, ur: NEGATIVE mg/dL
Leukocytes,Ua: NEGATIVE
Nitrite: NEGATIVE
Protein, ur: NEGATIVE mg/dL
Specific Gravity, Urine: 1.04 — ABNORMAL HIGH (ref 1.005–1.030)
pH: 5 (ref 5.0–8.0)

## 2023-05-05 LAB — RAD ONC ARIA SESSION SUMMARY
Course Elapsed Days: 1
Plan Fractions Treated to Date: 2
Plan Prescribed Dose Per Fraction: 3 Gy
Plan Total Fractions Prescribed: 10
Plan Total Prescribed Dose: 30 Gy
Reference Point Dosage Given to Date: 6 Gy
Reference Point Session Dosage Given: 3 Gy
Session Number: 2

## 2023-05-05 LAB — CMP (CANCER CENTER ONLY)
ALT: 12 U/L (ref 0–44)
AST: 16 U/L (ref 15–41)
Albumin: 3.1 g/dL — ABNORMAL LOW (ref 3.5–5.0)
Alkaline Phosphatase: 107 U/L (ref 38–126)
Anion gap: 11 (ref 5–15)
BUN: 16 mg/dL (ref 8–23)
CO2: 26 mmol/L (ref 22–32)
Calcium: 9 mg/dL (ref 8.9–10.3)
Chloride: 99 mmol/L (ref 98–111)
Creatinine: 0.85 mg/dL (ref 0.61–1.24)
GFR, Estimated: >60 mL/min (ref >60)
Glucose, Bld: 245 mg/dL — ABNORMAL HIGH (ref 70–99)
Potassium: 4.2 mmol/L (ref 3.5–5.1)
Sodium: 136 mmol/L (ref 135–145)
Total Bilirubin: 0.6 mg/dL (ref 0.3–1.2)
Total Protein: 6.9 g/dL (ref 6.5–8.1)

## 2023-05-05 LAB — CBC WITH DIFFERENTIAL/PLATELET
Abs Immature Granulocytes: 0.13 10*3/uL — ABNORMAL HIGH (ref 0.00–0.07)
Basophils Absolute: 0.1 10*3/uL (ref 0.0–0.1)
Basophils Relative: 1 %
Eosinophils Absolute: 0 10*3/uL (ref 0.0–0.5)
Eosinophils Relative: 0 %
HCT: 43.1 % (ref 39.0–52.0)
Hemoglobin: 13.4 g/dL (ref 13.0–17.0)
Immature Granulocytes: 1 %
Lymphocytes Relative: 9 %
Lymphs Abs: 0.9 10*3/uL (ref 0.7–4.0)
MCH: 26.8 pg (ref 26.0–34.0)
MCHC: 31.1 g/dL (ref 30.0–36.0)
MCV: 86.2 fL (ref 80.0–100.0)
Monocytes Absolute: 1 10*3/uL (ref 0.1–1.0)
Monocytes Relative: 10 %
Neutro Abs: 7.7 10*3/uL (ref 1.7–7.7)
Neutrophils Relative %: 79 %
Platelets: 313 10*3/uL (ref 150–400)
RBC: 5 MIL/uL (ref 4.22–5.81)
RDW: 15.9 % — ABNORMAL HIGH (ref 11.5–15.5)
WBC: 9.8 10*3/uL (ref 4.0–10.5)
nRBC: 0 % (ref 0.0–0.2)

## 2023-05-05 LAB — PROTEIN, URINE, RANDOM: Total Protein, Urine: 21 mg/dL

## 2023-05-05 MED FILL — Fosaprepitant Dimeglumine For IV Infusion 150 MG (Base Eq): INTRAVENOUS | Qty: 5 | Status: AC

## 2023-05-05 MED FILL — Dexamethasone Sodium Phosphate Inj 100 MG/10ML: INTRAMUSCULAR | Qty: 1 | Status: AC

## 2023-05-05 NOTE — Progress Notes (Signed)
Met with patient and wife to provide assistance with pleurx drainage. Pt's wife was able to successfully drain pleurx with minimal assistance. Pt tolerated the procedure well. Pleurx dressing and site were clean, dry, and intact. dark red fluid removed.Will follow up with patient and wife again tomorrow during chemotherapy visit. Nothing further needed at this time.

## 2023-05-06 ENCOUNTER — Other Ambulatory Visit: Payer: Self-pay

## 2023-05-06 ENCOUNTER — Inpatient Hospital Stay: Payer: Managed Care, Other (non HMO)

## 2023-05-06 ENCOUNTER — Encounter: Payer: Self-pay | Admitting: Oncology

## 2023-05-06 ENCOUNTER — Inpatient Hospital Stay (HOSPITAL_BASED_OUTPATIENT_CLINIC_OR_DEPARTMENT_OTHER): Payer: Managed Care, Other (non HMO) | Admitting: Nurse Practitioner

## 2023-05-06 ENCOUNTER — Encounter: Payer: Self-pay | Admitting: *Deleted

## 2023-05-06 ENCOUNTER — Other Ambulatory Visit: Payer: Self-pay | Admitting: Internal Medicine

## 2023-05-06 ENCOUNTER — Ambulatory Visit
Admission: RE | Admit: 2023-05-06 | Discharge: 2023-05-06 | Disposition: A | Discharge status: Home / Self Care | Payer: Managed Care, Other (non HMO) | Source: Ambulatory Visit | Attending: Radiation Oncology | Admitting: Radiation Oncology

## 2023-05-06 ENCOUNTER — Encounter: Payer: Self-pay | Admitting: Nurse Practitioner

## 2023-05-06 ENCOUNTER — Inpatient Hospital Stay (HOSPITAL_BASED_OUTPATIENT_CLINIC_OR_DEPARTMENT_OTHER): Payer: Managed Care, Other (non HMO) | Admitting: Hospice and Palliative Medicine

## 2023-05-06 VITALS — BP 98/78 | HR 93 | Temp 97.6°F | Resp 20 | Ht 70.0 in | Wt 167.4 lb

## 2023-05-06 VITALS — BP 94/77 | HR 93 | Temp 97.9°F | Resp 22

## 2023-05-06 DIAGNOSIS — Z5111 Encounter for antineoplastic chemotherapy: Secondary | ICD-10-CM | POA: Diagnosis not present

## 2023-05-06 DIAGNOSIS — C3491 Malignant neoplasm of unspecified part of right bronchus or lung: Secondary | ICD-10-CM

## 2023-05-06 DIAGNOSIS — Z515 Encounter for palliative care: Secondary | ICD-10-CM | POA: Diagnosis not present

## 2023-05-06 DIAGNOSIS — G893 Neoplasm related pain (acute) (chronic): Secondary | ICD-10-CM | POA: Diagnosis not present

## 2023-05-06 DIAGNOSIS — C7951 Secondary malignant neoplasm of bone: Secondary | ICD-10-CM

## 2023-05-06 DIAGNOSIS — Z510 Encounter for antineoplastic radiation therapy: Secondary | ICD-10-CM | POA: Diagnosis not present

## 2023-05-06 LAB — RAD ONC ARIA SESSION SUMMARY
Course Elapsed Days: 2
Plan Fractions Treated to Date: 3
Plan Prescribed Dose Per Fraction: 3 Gy
Plan Total Fractions Prescribed: 10
Plan Total Prescribed Dose: 30 Gy
Reference Point Dosage Given to Date: 9 Gy
Reference Point Session Dosage Given: 3 Gy
Session Number: 3

## 2023-05-06 MED ORDER — SODIUM CHLORIDE 0.9 % IV SOLN
200.0000 mg/m2 | Freq: Once | INTRAVENOUS | Status: AC
  Administered 2023-05-06: 390 mg via INTRAVENOUS
  Filled 2023-05-06: qty 65

## 2023-05-06 MED ORDER — SODIUM CHLORIDE 0.9 % IV SOLN
10.0000 mg | Freq: Once | INTRAVENOUS | Status: AC
  Administered 2023-05-06: 10 mg via INTRAVENOUS
  Filled 2023-05-06: qty 10

## 2023-05-06 MED ORDER — SODIUM CHLORIDE 0.9 % IV SOLN
717.6000 mg | Freq: Once | INTRAVENOUS | Status: AC
  Administered 2023-05-06: 720 mg via INTRAVENOUS
  Filled 2023-05-06: qty 72

## 2023-05-06 MED ORDER — PALONOSETRON HCL INJECTION 0.25 MG/5ML
0.2500 mg | Freq: Once | INTRAVENOUS | Status: AC
  Administered 2023-05-06: 0.25 mg via INTRAVENOUS
  Filled 2023-05-06: qty 5

## 2023-05-06 MED ORDER — DIPHENHYDRAMINE HCL 50 MG/ML IJ SOLN
25.0000 mg | Freq: Once | INTRAMUSCULAR | Status: AC
  Administered 2023-05-06: 25 mg via INTRAVENOUS
  Filled 2023-05-06: qty 1

## 2023-05-06 MED ORDER — SODIUM CHLORIDE 0.9 % IV SOLN
150.0000 mg | Freq: Once | INTRAVENOUS | Status: AC
  Administered 2023-05-06: 150 mg via INTRAVENOUS
  Filled 2023-05-06: qty 150

## 2023-05-06 MED ORDER — SODIUM CHLORIDE 0.9 % IV SOLN
Freq: Once | INTRAVENOUS | Status: AC
  Filled 2023-05-06: qty 250

## 2023-05-06 MED ORDER — HEPARIN SOD (PORK) LOCK FLUSH 100 UNIT/ML IV SOLN
500.0000 [IU] | Freq: Once | INTRAVENOUS | Status: AC | PRN
  Administered 2023-05-06: 500 [IU]
  Filled 2023-05-06: qty 5

## 2023-05-06 MED ORDER — FAMOTIDINE IN NACL 20-0.9 MG/50ML-% IV SOLN
20.0000 mg | Freq: Once | INTRAVENOUS | Status: AC
  Administered 2023-05-06: 20 mg via INTRAVENOUS
  Filled 2023-05-06: qty 50

## 2023-05-06 MED ORDER — SODIUM CHLORIDE 0.9 % IV SOLN
15.0000 mg/kg | Freq: Once | INTRAVENOUS | Status: AC
  Administered 2023-05-06: 1100 mg via INTRAVENOUS
  Filled 2023-05-06: qty 12

## 2023-05-06 NOTE — Patient Instructions (Signed)
[facility]  Discharge Instructions: Thank you for choosing [facility] to provide your oncology and hematology care.  If you have a lab appointment with the Cancer Center, please go directly to the Cancer Center and check in at the registration area.  Wear comfortable clothing and clothing appropriate for easy access to any Portacath or PICC line.   We strive to give you quality time with your provider. You may need to reschedule your appointment if you arrive late (15 or more minutes).  Arriving late affects you and other patients whose appointments are after yours.  Also, if you miss three or more appointments without notifying the office, you may be dismissed from the clinic at the provider's discretion.      For prescription refill requests, have your pharmacy contact our office and allow 72 hours for refills to be completed.    Today you received the following chemotherapy and/or immunotherapy agents mvasi/taxol/carboplatin    To help prevent nausea and vomiting after your treatment, we encourage you to take your nausea medication as directed.  BELOW ARE SYMPTOMS THAT SHOULD BE REPORTED IMMEDIATELY: *FEVER GREATER THAN 100.4 F (38 C) OR HIGHER *CHILLS OR SWEATING *NAUSEA AND VOMITING THAT IS NOT CONTROLLED WITH YOUR NAUSEA MEDICATION *UNUSUAL SHORTNESS OF BREATH *UNUSUAL BRUISING OR BLEEDING *URINARY PROBLEMS (pain or burning when urinating, or frequent urination) *BOWEL PROBLEMS (unusual diarrhea, constipation, pain near the anus) TENDERNESS IN MOUTH AND THROAT WITH OR WITHOUT PRESENCE OF ULCERS (sore throat, sores in mouth, or a toothache) UNUSUAL RASH, SWELLING OR PAIN  UNUSUAL VAGINAL DISCHARGE OR ITCHING   Items with * indicate a potential emergency and should be followed up as soon as possible or go to the Emergency Department if any problems should occur.  Please show the CHEMOTHERAPY ALERT CARD or IMMUNOTHERAPY ALERT CARD at  check-in to the Emergency Department and triage nurse.  Should you have questions after your visit or need to cancel or reschedule your appointment, please contact [facility]  [phone] and follow the prompts.  Office hours are 8:00 a.m. to 4:30 p.m. Monday - Friday. Please note that voicemails left after 4:00 p.m. may not be returned until the following business day.  We are closed weekends and major holidays. You have access to a nurse at all times for urgent questions. Please call the main number to the clinic [phone] and follow the prompts.  For any non-urgent questions, you may also contact your provider using MyChart. We now offer e-Visits for anyone 70 and older to request care online for non-urgent symptoms. For details visit [email or web address].   Also download the MyChart app! Go to the app store, search "MyChart", open the app, select [facility], and log in with your MyChart username and password.

## 2023-05-06 NOTE — Progress Notes (Signed)
[facility]  Telephone:([phone]) [phone] Fax:(336) [phone]  ID: Charles Hickman OB: May 13, 1961  MR#: [id number]  LKG#:[id number]  Patient Care Team: Dorothey Baseman, MD as PCP - General (Family Medicine) Mariah Milling Tollie Pizza, MD as PCP - Cardiology (Cardiology) Glory Buff, RN as Oncology Nurse Navigator  CHIEF COMPLAINT: Stage IV adenocarcinoma of the right lung  INTERVAL HISTORY: Patient returns to clinic for further evalution and consideration of initiation of chemotherapy. Shortness of breath improved with placement of pleurex. Complains of left leg swelling. Continues to have pain of skull base and left hip. Poor appetite. He has no other neurologic complaints.  He denies any recent fevers.  He denies any chest pain, cough, or hemoptysis.  He denies any nausea, vomiting, constipation, or diarrhea.  He has no urinary complaints.  He has noticed some left leg swelling.  Patient offers no further specific complaints.  REVIEW OF SYSTEMS:   Review of Systems  Constitutional:  Positive for malaise/fatigue and weight loss. Negative for fever.  Respiratory:  Positive for shortness of breath. Negative for cough and hemoptysis.   Cardiovascular:  Positive for leg swelling. Negative for chest pain.  Gastrointestinal: Negative.  Negative for abdominal pain.  Genitourinary: Negative.  Negative for dysuria and hematuria.  Musculoskeletal:  Positive for joint pain. Negative for back pain.  Skin: Negative.  Negative for rash.  Neurological:  Positive for weakness and headaches. Negative for dizziness and seizures.  Psychiatric/Behavioral: Negative.  The patient is not nervous/anxious.     As per HPI. Otherwise, a complete review of systems is negative.  PAST MEDICAL HISTORY: Past Medical History:  Diagnosis Date   Abnormal EKG 11/15/2017   Abnormal stress test    Atrial fibrillation (HCC)    CAD in native artery    a. Myoview 12/18: mod sized region of mild ischemia in the mid  to apical inferior wall c/w peri-infarct ischemia, large region of HK of the inf wall, EF 34%, EKG NSR w/ old inf MI. No EKG changes concerning for ischemia at peak stress or in recovery. Mod to high risk scan; b. LHC 12/15/17: pLAD 75% w/ FFR 0.74 s/p PCI/DES, mRCA 90%, CTO dRCA w/ L-R colatts, EF 45-50%, inf HK    Diabetes mellitus (HCC)    HCV (hepatitis C virus)    HLD (hyperlipidemia)    Hypertension    Large cell lymphoma (HCC)    a. s/p 6 cycles of R-CHOP   Peripheral neuropathy    Stroke [facility])    Systolic dysfunction    a. TTE 8/16: EF > 55%, aortic sclerosis, normal RV systolic function; b. LHC 12/15/17: EF 45-50%, inf HK    PAST SURGICAL HISTORY: Past Surgical History:  Procedure Laterality Date   APPENDECTOMY     CORONARY PRESSURE/FFR STUDY N/A 12/15/2017   Procedure: INTRAVASCULAR PRESSURE WIRE/FFR STUDY;  Surgeon: Iran Ouch, MD;  Location: [facility] INVASIVE CV LAB;  Service: Cardiovascular;  Laterality: N/A;   IR GUIDED DRAIN W CATHETER PLACEMENT  05/02/2023   IR IMAGING GUIDED PORT INSERTION  05/02/2023   IR US GUIDE BX ASP/DRAIN  05/02/2023   LEFT HEART CATH AND CORONARY ANGIOGRAPHY N/A 12/15/2017   Procedure: LEFT HEART CATH AND CORONARY ANGIOGRAPHY;  Surgeon: Iran Ouch, MD;  Location: [facility] INVASIVE CV LAB;  Service: Cardiovascular;  Laterality: N/A;   TONSILLECTOMY AND ADENOIDECTOMY      FAMILY HISTORY: Family History  Problem Relation Age of Onset   Hypertension Mother    Hyperlipidemia Mother  Stroke Mother    Diabetes Mother    Kidney disease Father    Hypertension Father    Diabetes Father    Dementia Maternal Grandmother     ADVANCED DIRECTIVES (Y/N):  N  HEALTH MAINTENANCE: Social History   Tobacco Use   Smoking status: Every Day    Packs/day: .5    Types: Cigarettes   Smokeless tobacco: Never   Tobacco comments:    Down to a couple of cigarettes a day  Substance Use Topics   Alcohol use: Yes    Comment: occasional   Drug use:  No     Colonoscopy:  PAP:  Bone density:  Lipid panel:  No Known Allergies  Current Outpatient Medications  Medication Sig Dispense Refill   apixaban (ELIQUIS) 5 MG TABS tablet TAKE 1 TABLET BY MOUTH TWICE A DAY 180 tablet 1   blood glucose meter kit and supplies KIT Dispense based on patient and insurance preference. Use 2 times daily as directed. (FOR ICD-10 E11.9). 1 each 0   fentaNYL (DURAGESIC) 25 MCG/HR Place 1 patch onto the skin every 3 (three) days. 10 patch 0   JARDIANCE 25 MG TABS tablet TAKE 1 TABLET BY MOUTH EVERY DAY 90 tablet 2   lidocaine-prilocaine (EMLA) cream Apply to affected area once 30 g 3   metoprolol succinate (TOPROL-XL) 50 MG 24 hr tablet Take 1 tablet (50 mg total) by mouth daily. TAKE WITH OR IMMEDIATELY FOLLOWING A MEAL. 90 tablet 3   ONETOUCH VERIO test strip USE TWICE A DAY AS DIRECTED 100 strip 0   oxyCODONE (OXY IR/ROXICODONE) 5 MG immediate release tablet Take 1 tablet (5 mg total) by mouth every 6 (six) hours as needed for severe pain. 60 tablet 0   rosuvastatin (CRESTOR) 10 MG tablet Take 1 tablet (10 mg total) by mouth daily. 90 tablet 3   naloxone (NARCAN) nasal spray 4 mg/0.1 mL See admin instructions. (Patient not taking: Reported on 05/06/2023)     nitroGLYCERIN (NITROSTAT) 0.4 MG SL tablet Place 1 tablet (0.4 mg total) under the tongue every 5 (five) minutes as needed for chest pain. (Patient not taking: Reported on 05/06/2023) 25 tablet 3   ondansetron (ZOFRAN) 8 MG tablet Take 1 tablet (8 mg total) by mouth every 8 (eight) hours as needed for nausea or vomiting. Start on the third day after carboplatin. (Patient not taking: Reported on 05/06/2023) 60 tablet 2   prochlorperazine (COMPAZINE) 10 MG tablet Take 1 tablet (10 mg total) by mouth every 6 (six) hours as needed for nausea or vomiting. (Patient not taking: Reported on 05/06/2023) 60 tablet 2   No current facility-administered medications for this visit.    OBJECTIVE: Vitals:   05/06/23  0845  BP: 98/78  Pulse: 93  Resp: 20  Temp: 97.6 F (36.4 C)  SpO2: 100%     Body mass index is 24.02 kg/m.    ECOG FS:1 - Symptomatic but completely ambulatory  General: Well-developed, well-nourished, no acute distress. Eyes: Pink conjunctiva, anicteric sclera. Lungs: Clear to auscultation bilaterally.  No audible wheezing or coughing Heart: Regular rate and rhythm.  Abdomen: Soft, nontender, nondistended.  Musculoskeletal: Left leg 2+ edema. Right 1+. Nontender.  Neuro: Alert, answering all questions appropriately. Cranial nerves grossly intact. Skin: No rashes or petechiae noted. Pale Psych: Normal affect.    LAB RESULTS: Lab Results  Component Value Date   NA 136 05/05/2023   K 4.2 05/05/2023   CL 99 05/05/2023   CO2 26 05/05/2023  GLUCOSE 245 (H) 05/05/2023   BUN 16 05/05/2023   CREATININE 0.85 05/05/2023   CALCIUM 9.0 05/05/2023   PROT 6.9 05/05/2023   ALBUMIN 3.1 (L) 05/05/2023   AST 16 05/05/2023   ALT 12 05/05/2023   ALKPHOS 107 05/05/2023   BILITOT 0.6 05/05/2023   GFRNONAA >60 05/05/2023   GFRAA 87 12/11/2020   Lab Results  Component Value Date   WBC 9.8 05/05/2023   NEUTROABS 7.7 05/05/2023   HGB 13.4 05/05/2023   HCT 43.1 05/05/2023   MCV 86.2 05/05/2023   PLT 313 05/05/2023   STUDIES: DG Chest Port 1 View  Result Date: 05/02/2023 CLINICAL DATA:  Lung cancer, shortness of breath, hypoxia, status post pleural catheter drain placement EXAM: PORTABLE CHEST 1 VIEW COMPARISON:  04/20/2023 FINDINGS: Interval right chest PleurX catheter insertion. Despite 1.6 L right thoracentesis following the procedure there remains complete opacification of the right hemithorax. No pneumothorax. Right IJ port catheter tip SVC RA junction level. Diffuse left lung nodular interstitial opacities compatible with known pulmonary metastatic disease. No developing left pleural effusion. Stable heart size. No acute osseous finding. IMPRESSION: 1. Interval right chest PleurX  catheter insertion. 2. Persistent complete opacification of the right hemithorax. 3. No pneumothorax. Electronically Signed   By: Judie Petit.  Shick M.D.   On: 05/02/2023 12:18   IR US Guide Bx Asp/Drain  Result Date: 05/02/2023 INDICATION: 62 year old male with advanced stage right lung cancer and left supraclavicular lymphadenopathy presenting for core biopsy for molecular testing. EXAM: Ultrasound-guided left supraclavicular lymph node biopsy MEDICATIONS: None. ANESTHESIA/SEDATION: Moderate (conscious) sedation was employed during this procedure. A total of Versed 2 mg and Fentanyl 100 mcg was administered intravenously. Moderate Sedation Time: 36 minutes. The patient's level of consciousness and vital signs were monitored continuously by radiology nursing throughout the procedure under my direct supervision. FLUOROSCOPY TIME:  None. COMPLICATIONS: None immediate. PROCEDURE: Informed written consent was obtained from the patient after a thorough discussion of the procedural risks, benefits and alternatives. All questions were addressed. Charles Hickman Sterile Barrier Technique was utilized including caps, mask, sterile gowns, sterile gloves, sterile drape, hand hygiene and skin antiseptic. A timeout was performed prior to the initiation of the procedure. Preprocedure ultrasound evaluation demonstrated an ill-defined, heterogeneously hypoechoic prominent lymph node in the left supraclavicular region just lateral to the left internal jugular vein. The procedure was planned. The left neck was prepped and draped in standard fashion. Subdermal Local anesthesia was provided at the planned needle entry site with 1% lidocaine. A small skin nick was made. Under direct ultrasound visualization, an 17 gauge coaxial introducer needle was advanced into the periphery of the lymph node. Next, a total of 318 gauge cores were obtained with an 18 gauge supercore advantage biopsy device. The samples were placed in formalin. The introducer  needle was removed. Postprocedure ultrasound evaluation demonstrated no evidence of surrounding hematoma or other complicating features. Hemostasis was achieved at the needle entry site with brief manual compression. A sterile bandage was applied. The patient tolerated the procedure well was transferred to the recovery area in good condition. IMPRESSION: Technically successful ultrasound-guided left supraclavicular lymph node biopsy. Marliss Coots, MD [facility] Electronically Signed   By: Marliss Coots M.D.   On: 05/02/2023 10:06   IR Guided Drain W Catheter Placement  Result Date: 05/02/2023 CLINICAL DATA:  62 year old male with advanced stage right lung cancer and recurrent right malignant pleural effusion. EXAM: INSERTION OF TUNNELED right SIDED PLEURAL DRAINAGE CATHETER COMPARISON:  None  Available. MEDICATIONS: Ancef 2 gm IV; Antibiotic was administered in an appropriate time interval for the procedure. ANESTHESIA/SEDATION: Moderate (conscious) sedation was employed during this procedure. A total of Versed 2 mg and Fentanyl 100 mcg was administered intravenously. Moderate Sedation Time: 36 minutes. The patient's level of consciousness and vital signs were monitored continuously by radiology nursing throughout the procedure under my direct supervision. FLUOROSCOPY TIME:  FLUOROSCOPY TIME Two mGy COMPLICATIONS: None immediate. PROCEDURE: The procedure, risks, benefits, and alternatives were explained to the patient, who wishes to proceed with the placement of this permanent pleural catheter as they are seeking palliative care. The patient understands and consents to the procedure. The right lateral chest and upper abdomen were prepped and draped in a sterile fashion, and a sterile drape was applied covering the operative field. A sterile gown and sterile gloves were used for the procedure. Initial ultrasound scanning and fluoroscopic imaging  demonstrates a recurrent moderate to large pleural effusion. Under direct ultrasound guidance, the inferior lateral pleural space was accessed with a Yueh sheath needle after the overlying soft tissues were anesthetized with 1% lidocaine with epinephrine. An Amplatz super stiff wire was then advanced under fluoroscopy into the pleural space. A 15.5 French tunneled Pleur-X catheter was tunneled from an incision within the right upper abdominal quadrant to the access site. The pleural access site was serially dilated under fluoroscopy, ultimately allowing placement of a peel-away sheath. The catheter was advanced through the peel-away sheath. The sheath was then removed. Final catheter positioning was confirmed with a fluoroscopic radiographic image. The access incision was closed with 3 0 Vicryl suture and Dermabond. A 0 silk retention suture was applied at the catheter exit site. Large volume thoracentesis was performed through the new catheter utilizing provided bulb vacuum assisted drainage bag. Dressings were applied. The patient tolerated the above procedure well without immediate postprocedural complication. FINDINGS: Preprocedural ultrasound scanning demonstrates a recurrent large sized right sided pleural effusion. After ultrasound and fluoroscopic guided placement, the catheter is directed medially and superiorly. Following catheter placement, approximately 1,600 cc of translucent, port colored pleural fluid was removed. IMPRESSION: Successful placement of permanent, tunneled right pleural drainage catheter via lateral approach. Approximately 1.6 liters of pleural fluid was removed following catheter placement. Marliss Coots, MD [facility] Radiology Electronically Signed   By: Marliss Coots M.D.   On: 05/02/2023 10:03   IR IMAGING GUIDED PORT INSERTION  Result Date: 05/02/2023 INDICATION: 62 year old male with history of advanced stage right lung cancer  presenting for Port-A-Cath placement. EXAM: IMPLANTED PORT A CATH PLACEMENT WITH ULTRASOUND AND FLUOROSCOPIC GUIDANCE COMPARISON:  None Available. MEDICATIONS: None. ANESTHESIA/SEDATION: Moderate (conscious) sedation was employed during this procedure. A total of Versed 2 mg and Fentanyl 100 mcg was administered intravenously. Moderate Sedation Time: 36 minutes. The patient's level of consciousness and vital signs were monitored continuously by radiology nursing throughout the procedure under my direct supervision. CONTRAST:  None FLUOROSCOPY TIME:  Two mGy COMPLICATIONS: None immediate. PROCEDURE: The procedure, risks, benefits, and alternatives were explained to the patient. Questions regarding the procedure were encouraged and answered. The patient understands and consents to the procedure. The right neck and chest were prepped with chlorhexidine in a sterile fashion, and a sterile drape was applied covering the operative field. Maximum barrier sterile technique with sterile gowns and gloves were used for the procedure. A timeout was performed prior to the initiation of the procedure. Ultrasound was used to examine the jugular vein which was compressible and free  of internal echoes. A skin marker was used to demarcate the planned venotomy and port pocket incision sites. Local anesthesia was provided to these sites and the subcutaneous tunnel track with 1% lidocaine with 1:100,000 epinephrine. A small incision was created at the jugular access site and blunt dissection was performed of the subcutaneous tissues. Under ultrasound guidance, the jugular vein was accessed with a 21 ga micropuncture needle and an 0.018" wire was inserted to the superior vena cava. Real-time ultrasound guidance was utilized for vascular access including the acquisition of a permanent ultrasound image documenting patency of the accessed vessel. A 5 Fr micopuncture set was then used, through which a 0.035" Rosen wire was passed under  fluoroscopic guidance into the inferior vena cava. An 8 Fr dilator was then placed over the wire. A subcutaneous port pocket was then created along the upper chest wall utilizing a combination of sharp and blunt dissection. The pocket was irrigated with sterile saline, packed with gauze, and observed for hemorrhage. A single lumen standard sized power injectable port was chosen for placement. The 8 Fr catheter was tunneled from the port pocket site to the venotomy incision. The port was placed in the pocket. The external catheter was trimmed to appropriate length. The dilator was exchanged for an 8 Fr peel-away sheath under fluoroscopic guidance. The catheter was then placed through the sheath and the sheath was removed. Final catheter positioning was confirmed and documented with a fluoroscopic spot radiograph. The port was accessed with a Huber needle, aspirated, and flushed with heparinized saline. The deep dermal layer of the port pocket incision was closed with interrupted 3-0 Vicryl suture. Dermabond was then placed over the port pocket and neck incisions. The patient tolerated the procedure well without immediate post procedural complication. FINDINGS: After catheter placement, the tip lies within the superior cavoatrial junction. The catheter aspirates and flushes normally and is ready for immediate use. IMPRESSION: Successful placement of a power injectable Port-A-Cath via the right internal jugular vein. The catheter is ready for immediate use. Marliss Coots, MD [facility] Radiology Electronically Signed   By: Marliss Coots M.D.   On: 05/02/2023 10:00   US Venous Img Lower Unilateral Left  Result Date: 04/28/2023 CLINICAL DATA:  Leg swelling EXAM: Left LOWER EXTREMITY VENOUS DOPPLER ULTRASOUND TECHNIQUE: Gray-scale sonography with compression, as well as color and duplex ultrasound, were performed to evaluate the deep venous system(s) from the level of  the common femoral vein through the popliteal and proximal calf veins. COMPARISON:  None Available. FINDINGS: VENOUS Normal compressibility of the common femoral, superficial femoral, and popliteal veins, as well as the visualized calf veins. Visualized portions of profunda femoral vein and great saphenous vein unremarkable. No filling defects to suggest DVT on grayscale or color Doppler imaging. Doppler waveforms show normal direction of venous flow, normal respiratory plasticity and response to augmentation. Limited views of the contralateral common femoral vein are unremarkable. OTHER There is slow flow seen in the left popliteal vein. Limitations: none IMPRESSION: No evidence of left lower extremity DVT Electronically Signed   By: Karen Kays M.D.   On: 04/28/2023 15:58   MR Brain W Wo Contrast  Result Date: 04/22/2023 CLINICAL DATA:  Initial staging for lung nodules. Headaches. History of lymphoma. EXAM: MRI HEAD WITHOUT AND WITH CONTRAST TECHNIQUE: Multiplanar, multiecho pulse sequences of the brain and surrounding structures were obtained without and with intravenous contrast. CONTRAST:  8.5 mL Vueway COMPARISON:  Head MRI 06/01/2011.  PET-CT 04/19/2023.  FINDINGS: Brain: There is no evidence of an acute infarct, intracranial hemorrhage, midline shift, or extra-axial fluid collection. Scattered small T2 hyperintensities in the cerebral white matter have mildly progressed from 2012 and are nonspecific but compatible with mild chronic small vessel ischemic disease. A chronic lacunar infarct is noted in the right corona radiata. The ventricles and sulci are within normal limits for age. A cavum septum pellucidum et vergae is incidentally noted. There is a 3 mm round enhancing lesion in the left cerebellar hemisphere without edema (series 119, image 31). Vascular: Major intracranial vascular flow voids are preserved. Skull and upper cervical spine: As seen on PET-CT, there is an approximately 5 cm destructive  mass centered in the left occipital bone at the skull base. There is associated dural thickening and enhancement overlying the inferior and lateral aspects of the left cerebellar hemisphere. Tumor involves the left hypoglossal canal and jugular foramen. There is a separate 1 cm lesion in the clivus. Sinuses/Orbits: Unremarkable orbits. Moderate mucosal thickening and minimal fluid in the left maxillary sinus. Moderate left mastoid fluid. Other: None. IMPRESSION: 1. 3 mm enhancing lesion in the left cerebellar hemisphere suspicious for a metastasis. 2. 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 3. Additional small lesion in the clivus. Electronically Signed   By: Sebastian Ache M.D.   On: 04/22/2023 08:05   NM PET Image Initial (PI) Skull Base To Thigh  Result Date: 04/20/2023 CLINICAL DATA:  Initial treatment strategy for pulmonary nodules. Prior history of lymphoma. EXAM: NUCLEAR MEDICINE PET SKULL BASE TO THIGH TECHNIQUE: 9.22 mCi F-18 FDG was injected intravenously. Full-ring PET imaging was performed from the skull base to thigh after the radiotracer. CT data was obtained and used for attenuation correction and anatomic localization. Fasting blood glucose: 113 mg/dl COMPARISON:  Chest CT 40/98/1191 FINDINGS: Mediastinal blood pool activity: SUV max 2.43 Liver activity: SUV max 2.70 NECK: No neck mass or cervical lymphadenopathy. Incidental CT findings: None. CHEST: Small lymph node noted in the left supraclavicular fossa with SUV max of 2.3. There are also small supraclavicular and subclavicular lymph nodes on the left side which are hypermetabolic. SUV max is 2.86 The right lung is completely obstructed/drowned with a abrupt cut off of the right mainstem bronchus. There are numerous areas of hypermetabolism throughout the obstructed right lung. Right upper lobe hypermetabolism near the bronchial cut has an SUV max of 6.59. Findings consistent with a probable primary lung neoplasm and diffuse  pulmonary metastatic disease. There is also a large right pleural effusion and multiple hypermetabolic pleural lesions. The recent thoracentesis may yield a pathologic diagnosis. There is also mediastinal lymphadenopathy which is hypermetabolic. The largest upper right mediastinal disease measures a maximum of 23 mm and the SUV max is 5.70. Diffuse pulmonary nodules throughout the aerated left lung. The larger nodules demonstrate hypermetabolism in her consistent with metastatic disease. Incidental CT findings: The heart is normal in size. No pericardial effusion. There are age advanced aortic and three-vessel coronary artery calcifications. ABDOMEN/PELVIS: No findings to suggest hepatic metastatic disease. There is a 2.2 cm left adrenal gland lesion demonstrating mild hypermetabolism with SUV max of 3.76 which is likely a metastatic focus. No enlarged or hypermetabolic mesenteric or retroperitoneal nodes to suggest recurrent lymphoma or metastatic adenopathy. No pelvic adenopathy. Incidental CT findings: Significant age advanced atherosclerotic calcification involving the aorta and branch vessels but no aneurysm. SKELETON: Diffuse osseous metastatic disease. There is a large destructive lesion involving the left occipital bone at the skull base with  SUV max of 6.53. Left-sided sternal lesion has an SUV max of 5.87. Left fifth anterior rib has an SUV max of 6.32. Multiple vertebral body lesions without spinal canal compromise. T9 lesion has an SUV max of 4.89. L1 lesion has an SUV max of 6.56. Large destructive central sacral lesion has an SUV max of 5.50. Large destructive left hip lesion has an SUV max of 4.75. Findings highly worrisome for impending fracture. Right hip lesion at the level of the lesser trochanter has an SUV max of 5.22. Incidental CT findings: None. IMPRESSION: 1. Obstructed/drowned right lung with numerous areas of hypermetabolism likely due to a primary lung neoplasm. 2. Large right pleural  effusion with multiple hypermetabolic pleural lesions. The recent thoracentesis may yield a pathologic diagnosis. If not, biopsy of a left supraclavicular node may be the easiest and safest tissue diagnosis. 3. Diffuse pulmonary metastatic disease. 4. Hypermetabolic supraclavicular and mediastinal lymphadenopathy. 5. Left adrenal gland lesion is likely a metastatic lesion. 6. Diffuse osseous metastatic disease. The left hip lesion is highly worrisome for impending fracture. Large destructive lesion at the left skull base. Vertebral body lesions but no spinal canal compromise. 7. Age advanced atherosclerotic calcification involving the aorta and three-vessel coronary artery calcifications. Aortic Atherosclerosis (ICD10-I70.0). Electronically Signed   By: Rudie Meyer M.D.   On: 04/20/2023 16:38   US THORACENTESIS ASP PLEURAL SPACE W/IMG GUIDE  Result Date: 04/20/2023 INDICATION: History of lymphoma found to have right-sided recurrent pleural effusion. Request received for diagnostic and therapeutic right thoracentesis. EXAM: ULTRASOUND GUIDED DIAGNOSTIC AND THERAPEUTIC RIGHT THORACENTESIS MEDICATIONS: 10 mL 1 % lidocaine COMPLICATIONS: None immediate. PROCEDURE: An ultrasound guided thoracentesis was thoroughly discussed with the patient and questions answered. The benefits, risks, alternatives and complications were also discussed. The patient understands and wishes to proceed with the procedure. Written consent was obtained. Ultrasound was performed to localize and mark an adequate pocket of fluid in the right chest. The area was then prepped and draped in the normal sterile fashion. 1% Lidocaine was used for local anesthesia. Under ultrasound guidance a 6 Fr Safe-T-Centesis catheter was introduced. Thoracentesis was performed. The catheter was removed and a dressing applied. FINDINGS: A total of approximately 1.2 L of hazy, amber fluid was removed. Samples were sent to the laboratory as requested by the  clinical team. IMPRESSION: Successful ultrasound guided right thoracentesis yielding 1.2 L of pleural fluid. Read by: Alex Gardener, AGNP-BC Electronically Signed   By: Olive Bass M.D.   On: 04/20/2023 16:16   DG Chest Port 1 View  Result Date: 04/20/2023 CLINICAL DATA:  Right pleural effusion.  Status post thoracentesis. EXAM: PORTABLE CHEST 1 VIEW COMPARISON:  Chest x-ray 04/11/2023 and recent PET-CT 04/19/2023. FINDINGS: Persistent complete opacification of the right hemithorax with a bronchial cut off sign suggesting diffuse drowned/obstructed right lung. I do not see any obvious hypermetabolic central lung mass on the PET-CT. Recommend bronchoscopic evaluation. The left lung demonstrates diffuse interstitial and airspace process and innumerable pulmonary nodules. IMPRESSION: Persistent complete opacification of the right hemithorax despite the thoracentesis with a bronchial cut off sign suggesting diffuse drowned/obstructed right lung. Recommend bronchoscopic evaluation. Electronically Signed   By: Rudie Meyer M.D.   On: 04/20/2023 16:13   US THORACENTESIS ASP PLEURAL SPACE W/IMG GUIDE  Result Date: 04/11/2023 INDICATION: Patient with history of lymphoma. Found to have right-sided pleural effusion with diffuse left lobe lung nodules consistent with underlying malignancy yet to be determined. Patient presents for therapeutic and diagnostic thoracentesis EXAM: ULTRASOUND GUIDED THERAPEUTIC  AND DIAGNOSTIC THORACENTESIS MEDICATIONS: Lidocaine 1% 10 mL COMPLICATIONS: None immediate. PROCEDURE: An ultrasound guided thoracentesis was thoroughly discussed with the patient and questions answered. The benefits, risks, alternatives and complications were also discussed. The patient understands and wishes to proceed with the procedure. Written consent was obtained. Ultrasound was performed to localize and mark an adequate pocket of fluid in the right chest. The area was then prepped and draped in the normal  sterile fashion. 1% Lidocaine was used for local anesthesia. Under ultrasound guidance a 6 Fr Safe-T-Centesis catheter was introduced. Thoracentesis was performed. The catheter was removed and a dressing applied. FINDINGS: A total of approximately 1 L of amber color fluid was removed. Samples were sent to the laboratory as requested by the clinical team. IMPRESSION: Successful ultrasound guided right-sided therapeutic and diagnostic thoracentesis yielding 1 L of pleural fluid. Read by: Anders Grant, NP Electronically Signed   By: Malachy Moan M.D.   On: 04/11/2023 16:10   DG Chest Port 1 View  Result Date: 04/11/2023 CLINICAL DATA:  Right pleural effusion status post thoracentesis. 1 L of fluid drained from right pleural space earlier today. EXAM: PORTABLE CHEST 1 VIEW COMPARISON:  AP chest 04/06/2023, chest two views 11/15/2017; CT chest 03/31/2023 FINDINGS: There is again complete homogeneous opacification of the right hemithorax. Mild rightward mediastinal shift is similar to 04/06/2023 and 03/31/2023 CT, likely due to the compressive atelectasis associated with a large right pleural effusion. Diffuse left lung moderate interstitial thickening and reticulonodular opacities are similar to prior. No definite left pleural effusion, noting no pleural effusion was seen on 04/06/2023 radiographs but a small pleural effusion was seen on 03/31/2023 CT. No pneumothorax. Visualized cardiac silhouette and mediastinal contours are unremarkable. Mild-to-moderate atherosclerotic calcification within the aortic arch. No acute skeletal abnormality. IMPRESSION: 1. Complete homogeneous opacification of the right hemithorax, similar to 04/06/2023 and 03/31/2023 CT, likely due to the compressive atelectasis associated with persistent large right pleural effusion. Note is made of right-sided thoracentesis earlier today, and the persistent right hemithorax homogeneous opacification is similar to that seen on 04/06/2023  radiograph following thoracentesis earlier that day as well. 2. Diffuse left lung moderate interstitial thickening and reticulonodular opacities, similar to prior. Appearance on prior CT was suggestive of possible neoplastic etiology including lymphoma given patient history of lymphoma. Electronically Signed   By: Neita Garnet M.D.   On: 04/11/2023 15:45   US THORACENTESIS ASP PLEURAL SPACE W/IMG GUIDE  Result Date: 04/06/2023 INDICATION: Patient with a history of right pleural effusion and diffuse left lung nodules consistent with underlying malignancy. Patient also has a history of lymphoma. Interventional radiology asked to perform a diagnostic and therapeutic thoracentesis. EXAM: ULTRASOUND GUIDED THORACENTESIS MEDICATIONS: 1% lidocaine 10 mL COMPLICATIONS: None immediate. PROCEDURE: An ultrasound guided thoracentesis was thoroughly discussed with the patient and questions answered. The benefits, risks, alternatives and complications were also discussed. The patient understands and wishes to proceed with the procedure. Written consent was obtained. Ultrasound was performed to localize and mark an adequate pocket of fluid in the right chest. The area was then prepped and draped in the normal sterile fashion. 1% Lidocaine was used for local anesthesia. Under ultrasound guidance a 6 Fr Safe-T-Centesis catheter was introduced. Thoracentesis was performed. The catheter was removed and a dressing applied. FINDINGS: A total of approximately 1.5 L of amber/blood-tinged fluid was removed. Samples were sent to the laboratory as requested by the clinical team. IMPRESSION: Successful ultrasound guided right thoracentesis yielding 1.5 L of pleural fluid. Read by: Asher Muir  Basilia Jumbo, NP Electronically Signed   By: Olive Bass M.D.   On: 04/06/2023 16:13   DG Chest Port 1 View  Result Date: 04/06/2023 CLINICAL DATA:  post thoracentesis, large right EXAM: PORTABLE CHEST 1 VIEW COMPARISON:  None Available. FINDINGS:  There remains almost complete opacification of the right hemithorax status post thoracentesis. No pneumothorax. There is slight rightward shift of the trachea suggestive of volume loss. There is diffuse reticulonodular opacities of the left lung compatible with known extensive disease of the left lung. No appreciable left pleural effusion. No acute osseous abnormality. IMPRESSION: Persistent almost complete opacification of the right lung compatible with persistent large effusion despite recent thoracentesis. Similar extensive disease of the left lung. Electronically Signed   By: Olive Bass M.D.   On: 04/06/2023 15:42    ASSESSMENT: Stage IV adenocarcinoma of the right lung.  PLAN:    Stage IV adenocarcinoma of the right lung: PET scan results from April 19, 2023 reviewed independently and reported as above with widespread metastatic disease throughout lungs and bones.  MRI of the brain on April 18, 2023 revealed a 3 mm focus.  Patient will have port, Pleurx catheter, and supraclavicular lymph node biopsy on Monday as scheduled.  He will initiate XRT to his left hip and left skull base next week.  Plan to initiate carboplatinum, taxol, and avastin with udenyca support every 21 days. Plan for reimaging PET after 4 cycles. LN Bx has been sent off for limited panel molecular testing d/t limited sample. Charles Hickman coordinated.  Labs reviewed and acceptable for initiation of treatment. Potential side effects and management reviewed in detail including role for antiemetics.  Pleural effusion: Thoracentesis x 3 as above.  Pleurx catheter placed on Monday. Appreciate Charles Hickman's assistance with management. Continues to have 500-650 cc of sanguinous drainage daily. Likely related to placement.  Headache/neck pain: Continue fentanyl patch and oxycodone as needed.  Metastatic focus of brain: Only 3 mm.  Monitor and repeat brain MRI in 3 months.  Bone metastases- likely a role for bisphosphonates in the futures but on  hold for now as he begins treatment. Discuss after 3-6 cycles.  History of diffuse large B-cell lymphoma: Patient completed cycle 6 of R-CHOP in November 2016 at [facility].  By report, PET scan in December 2016 revealed no evidence of disease. Goals of care: establishing care with palliative care. Treatment given with palliative intent Weight loss- will see dietician today as well.  Leg swelling - R > L. Negative ultrasound. Likely dependent edema. Conservative measures including elevation, compression reviewed.  A fib- on eliquis. Rate controlled.   RTC in 1 week for tolerance check and labs with Dr. Orlie Hickman. RTC in 3 weeks for consideration of cycle 2 of carbo-taxol-bev with udenyca on D3.   Patient expressed understanding and was in agreement with this plan. He also understands that He can call clinic at any time with any questions, concerns, or complaints.    Cancer Staging  Adenocarcinoma, lung, right [employer]) Staging form: Lung, AJCC 8th Edition - Clinical stage from 04/28/2023: Stage IVB (cTX, cNX, pM1c) - Signed by Jeralyn Ruths, MD on 04/28/2023 Stage prefix: Initial diagnosis   Alinda Dooms, NP   05/06/2023 9:04 AM

## 2023-05-06 NOTE — Progress Notes (Signed)
Met with patient during follow up visit prior to starting chemotherapy. All questions answered during visit. No needs at this time. Instructed pt to call with any questions or needs. Pt verbalized understanding.

## 2023-05-06 NOTE — Progress Notes (Signed)
Palliative Medicine [facility] at [facility] [phone]) [phone] [phone]   Name: MOLLY ARMBRECHT Date: 05/06/2023 MRN: [id number]  DOB: [birth year]  Patient Care Team: Dorothey Baseman, MD as PCP - General (Family Medicine) Mariah Milling, Tollie Pizza, MD as PCP - Cardiology (Cardiology) Glory Buff, RN as Oncology Nurse Navigator    REASON FOR CONSULTATION: IVANN GUADAGNOLI is a 62 y.o. 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  Patient also has malignant pleural effusion and is status post Pleurx catheter.  He was referred to palliative care to address goals and manage ongoing symptoms.  SOCIAL HISTORY:     reports that he has been smoking cigarettes. He has been smoking an average of .5 packs per day. He has never used smokeless tobacco. He reports current alcohol use. He reports that he does not use drugs.  Patient is married and lives at home with his wife.  He has a son who lives nearby.  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  ADVANCE DIRECTIVES:    CODE STATUS:   PAST MEDICAL HISTORY: Past Medical History:  Diagnosis Date   Abnormal EKG 11/15/2017   Abnormal stress test    Atrial fibrillation (HCC)    CAD in native artery    a. 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 No EKG changes concerning for ischemia at peak stress or in recovery. Mod to high risk scan; b. LHC 12/15/17: pLAD 75% w/ FFR 0.74 s/p PCI/DES, mRCA 90%, CTO dRCA w/ L-R colatts, EF 45-50%, inf HK    Diabetes mellitus (HCC)    HCV (hepatitis C virus)    HLD (hyperlipidemia)    Hypertension    Large cell lymphoma (HCC)    a. s/p 6 cycles of R-CHOP   Peripheral neuropathy    Stroke [facility])    Systolic dysfunction    a. TTE 8/16: EF > 55%, aortic  sclerosis, normal RV systolic function; b. LHC 12/15/17: EF 45-50%, inf HK    PAST SURGICAL HISTORY:  Past Surgical History:  Procedure Laterality Date   APPENDECTOMY     CORONARY PRESSURE/FFR STUDY N/A 12/15/2017   Procedure: INTRAVASCULAR PRESSURE WIRE/FFR STUDY;  Surgeon: Iran Ouch, MD;  Location: [facility] INVASIVE CV LAB;  Service: Cardiovascular;  Laterality: N/A;   IR GUIDED DRAIN W CATHETER PLACEMENT  05/02/2023   IR IMAGING GUIDED PORT INSERTION  05/02/2023   IR US GUIDE BX ASP/DRAIN  05/02/2023   LEFT HEART CATH AND CORONARY ANGIOGRAPHY N/A 12/15/2017   Procedure: LEFT HEART CATH AND CORONARY ANGIOGRAPHY;  Surgeon: Iran Ouch, MD;  Location: [facility] INVASIVE CV LAB;  Service: Cardiovascular;  Laterality: N/A;   TONSILLECTOMY AND ADENOIDECTOMY      HEMATOLOGY/ONCOLOGY HISTORY:  Oncology History  Adenocarcinoma, lung, right (HCC)  04/28/2023 Initial Diagnosis   Adenocarcinoma, lung, unspecified laterality (HCC)   04/28/2023 Cancer Staging   Staging form: Lung, AJCC 8th Edition - Clinical stage from 04/28/2023: Stage IVB (cTX, cNX, pM1c) - Signed by Jeralyn Ruths, MD on 04/28/2023 Stage prefix: Initial diagnosis   05/06/2023 -  Chemotherapy   Patient is on Treatment Plan : LUNG NSCLC Carboplatin + Paclitaxel + Bevacizumab q21d       ALLERGIES:  has No Known Allergies.  MEDICATIONS:  Current Outpatient  Medications  Medication Sig Dispense Refill   apixaban (ELIQUIS) 5 MG TABS tablet TAKE 1 TABLET BY MOUTH TWICE A DAY 180 tablet 1   blood glucose meter kit and supplies KIT Dispense based on patient and insurance preference. Use 2 times daily as directed. (FOR ICD-10 E11.9). 1 each 0   fentaNYL (DURAGESIC) 25 MCG/HR Place 1 patch onto the skin every 3 (three) days. 10 patch 0   JARDIANCE 25 MG TABS tablet TAKE 1 TABLET BY MOUTH EVERY DAY 90 tablet 2   lidocaine-prilocaine (EMLA) cream Apply to affected area once 30 g 3   metoprolol succinate (TOPROL-XL) 50 MG 24 hr  tablet Take 1 tablet (50 mg total) by mouth daily. TAKE WITH OR IMMEDIATELY FOLLOWING A MEAL. 90 tablet 3   naloxone (NARCAN) nasal spray 4 mg/0.1 mL See admin instructions. (Patient not taking: Reported on 05/06/2023)     nitroGLYCERIN (NITROSTAT) 0.4 MG SL tablet Place 1 tablet (0.4 mg total) under the tongue every 5 (five) minutes as needed for chest pain. (Patient not taking: Reported on 05/06/2023) 25 tablet 3   ondansetron (ZOFRAN) 8 MG tablet Take 1 tablet (8 mg total) by mouth every 8 (eight) hours as needed for nausea or vomiting. Start on the third day after carboplatin. (Patient not taking: Reported on 05/06/2023) 60 tablet 2   ONETOUCH VERIO test strip USE TWICE A DAY AS DIRECTED 100 strip 0   oxyCODONE (OXY IR/ROXICODONE) 5 MG immediate release tablet Take 1 tablet (5 mg total) by mouth every 6 (six) hours as needed for severe pain. 60 tablet 0   prochlorperazine (COMPAZINE) 10 MG tablet Take 1 tablet (10 mg total) by mouth every 6 (six) hours as needed for nausea or vomiting. (Patient not taking: Reported on 05/06/2023) 60 tablet 2   rosuvastatin (CRESTOR) 10 MG tablet Take 1 tablet (10 mg total) by mouth daily. 90 tablet 3   No current facility-administered medications for this visit.   Facility-Administered Medications Ordered in Other Visits  Medication Dose Route Frequency Provider Last Rate Last Admin   dexamethasone (DECADRON) 10 mg in sodium chloride 0.9 % 50 mL IVPB  10 mg Intravenous Once Jeralyn Ruths, MD 204 mL/hr at 05/06/23 0949 10 mg at 05/06/23 0949   diphenhydrAMINE (BENADRYL) injection 25 mg  25 mg Intravenous Once Jeralyn Ruths, MD       famotidine (PEPCID) IVPB 20 mg premix  20 mg Intravenous Once Jeralyn Ruths, MD       fosaprepitant (EMEND) 150 mg in sodium chloride 0.9 % 145 mL IVPB  150 mg Intravenous Once Jeralyn Ruths, MD       heparin lock flush 100 unit/mL  500 Units Intracatheter Once PRN Jeralyn Ruths, MD       palonosetron  (ALOXI) injection 0.25 mg  0.25 mg Intravenous Once Jeralyn Ruths, MD        VITAL SIGNS: There were no vitals taken for this visit. There were no vitals filed for this visit.  Estimated body mass index is 24.02 kg/m as calculated from the following:   Height as of an earlier encounter on 05/06/23: 5\' 10"  (1.778 m).   Weight as of an earlier encounter on 05/06/23: 167 lb 6.4 oz (75.9 kg).  LABS: CBC:    Component Value Date/Time   WBC 9.8 05/05/2023 0852   HGB 13.4 05/05/2023 0852   HGB 17.4 12/11/2020 1340   HCT 43.1 05/05/2023 0852   HCT 49.7 12/11/2020 1340  PLT 313 05/05/2023 0852   PLT 200 12/11/2020 1340   MCV 86.2 05/05/2023 0852   MCV 88 12/11/2020 1340   MCV 88 10/25/2013 0247   NEUTROABS 7.7 05/05/2023 0852   NEUTROABS 3.6 12/07/2017 0839   LYMPHSABS 0.9 05/05/2023 0852   LYMPHSABS 1.9 12/07/2017 0839   MONOABS 1.0 05/05/2023 0852   EOSABS 0.0 05/05/2023 0852   EOSABS 0.1 12/07/2017 0839   BASOSABS 0.1 05/05/2023 0852   BASOSABS 0.0 12/07/2017 0839   Comprehensive Metabolic Panel:    Component Value Date/Time   NA 136 05/05/2023 0852   NA 140 12/11/2020 1340   NA 138 10/25/2013 0247   K 4.2 05/05/2023 0852   K 4.1 10/25/2013 0247   CL 99 05/05/2023 0852   CL 106 10/25/2013 0247   CO2 26 05/05/2023 0852   CO2 27 10/25/2013 0247   BUN 16 05/05/2023 0852   BUN 15 12/11/2020 1340   BUN 20 (H) 10/25/2013 0247   CREATININE 0.85 05/05/2023 0852   CREATININE 1.41 (H) 10/25/2013 0247   GLUCOSE 245 (H) 05/05/2023 0852   GLUCOSE 182 (H) 10/25/2013 0247   CALCIUM 9.0 05/05/2023 0852   CALCIUM 9.2 10/25/2013 0247   AST 16 05/05/2023 0852   ALT 12 05/05/2023 0852   ALT 40 10/25/2013 0247   ALKPHOS 107 05/05/2023 0852   ALKPHOS 82 10/25/2013 0247   BILITOT 0.6 05/05/2023 0852   PROT 6.9 05/05/2023 0852   PROT 6.5 12/07/2017 0839   PROT 7.3 10/25/2013 0247   ALBUMIN 3.1 (L) 05/05/2023 0852   ALBUMIN 4.5 12/07/2017 0839   ALBUMIN 4.2 10/25/2013 0247     RADIOGRAPHIC STUDIES: DG Chest Port 1 View  Result Date: 05/02/2023 CLINICAL DATA:  Lung cancer, shortness of breath, hypoxia, status post pleural catheter drain placement EXAM: PORTABLE CHEST 1 VIEW COMPARISON:  04/20/2023 FINDINGS: Interval right chest PleurX catheter insertion. Despite 1.6 L right thoracentesis following the procedure there remains complete opacification of the right hemithorax. No pneumothorax. Right IJ port catheter tip SVC RA junction level. Diffuse left lung nodular interstitial opacities compatible with known pulmonary metastatic disease. No developing left pleural effusion. Stable heart size. No acute osseous finding. IMPRESSION: 1. Interval right chest PleurX catheter insertion. 2. Persistent complete opacification of the right hemithorax. 3. No pneumothorax. Electronically Signed   By: Judie Petit.  Shick M.D.   On: 05/02/2023 12:18   IR US Guide Bx Asp/Drain  Result Date: 05/02/2023 INDICATION: 61 year old male with advanced stage right lung cancer and left supraclavicular lymphadenopathy presenting for core biopsy for molecular testing. EXAM: Ultrasound-guided left supraclavicular lymph node biopsy MEDICATIONS: None. ANESTHESIA/SEDATION: Moderate (conscious) sedation was employed during this procedure. A total of Versed 2 mg and Fentanyl 100 mcg was administered intravenously. Moderate Sedation Time: 36 minutes. The patient's level of consciousness and vital signs were monitored continuously by radiology nursing throughout the procedure under my direct supervision. FLUOROSCOPY TIME:  None. COMPLICATIONS: None immediate. PROCEDURE: Informed written consent was obtained from the patient after a thorough discussion of the procedural risks, benefits and alternatives. All questions were addressed. Maximal Sterile Barrier Technique was utilized including caps, mask, sterile gowns, sterile gloves, sterile drape, hand hygiene and skin antiseptic. A timeout was performed prior to the  initiation of the procedure. Preprocedure ultrasound evaluation demonstrated an ill-defined, heterogeneously hypoechoic prominent lymph node in the left supraclavicular region just lateral to the left internal jugular vein. The procedure was planned. The left neck was prepped and draped in standard fashion. Subdermal Local anesthesia was  provided at the planned needle entry site with 1% lidocaine. A small skin nick was made. Under direct ultrasound visualization, an 17 gauge coaxial introducer needle was advanced into the periphery of the lymph node. Next, a total of 318 gauge cores were obtained with an 18 gauge supercore advantage biopsy device. The samples were placed in formalin. The introducer needle was removed. Postprocedure ultrasound evaluation demonstrated no evidence of surrounding hematoma or other complicating features. Hemostasis was achieved at the needle entry site with brief manual compression. A sterile bandage was applied. The patient tolerated the procedure well was transferred to the recovery area in good condition. IMPRESSION: Technically successful ultrasound-guided left supraclavicular lymph node biopsy. Marliss Coots, MD [facility] Radiology Electronically Signed   By: Marliss Coots M.D.   On: 05/02/2023 10:06   IR Guided Drain W Catheter Placement  Result Date: 05/02/2023 CLINICAL DATA:  61 year old male with advanced stage right lung cancer and recurrent right malignant pleural effusion. EXAM: INSERTION OF TUNNELED right SIDED PLEURAL DRAINAGE CATHETER COMPARISON:  None Available. MEDICATIONS: Ancef 2 gm IV; Antibiotic was administered in an appropriate time interval for the procedure. ANESTHESIA/SEDATION: Moderate (conscious) sedation was employed during this procedure. A total of Versed 2 mg and Fentanyl 100 mcg was administered intravenously. Moderate Sedation Time: 36 minutes. The patient's level of consciousness and vital signs were  monitored continuously by radiology nursing throughout the procedure under my direct supervision. FLUOROSCOPY TIME:  FLUOROSCOPY TIME Two mGy COMPLICATIONS: None immediate. PROCEDURE: The procedure, risks, benefits, and alternatives were explained to the patient, who wishes to proceed with the placement of this permanent pleural catheter as they are seeking palliative care. The patient understands and consents to the procedure. The right lateral chest and upper abdomen were prepped and draped in a sterile fashion, and a sterile drape was applied covering the operative field. A sterile gown and sterile gloves were used for the procedure. Initial ultrasound scanning and fluoroscopic imaging demonstrates a recurrent moderate to large pleural effusion. Under direct ultrasound guidance, the inferior lateral pleural space was accessed with a Yueh sheath needle after the overlying soft tissues were anesthetized with 1% lidocaine with epinephrine. An Amplatz super stiff wire was then advanced under fluoroscopy into the pleural space. A 15.5 French tunneled Pleur-X catheter was tunneled from an incision within the right upper abdominal quadrant to the access site. The pleural access site was serially dilated under fluoroscopy, ultimately allowing placement of a peel-away sheath. The catheter was advanced through the peel-away sheath. The sheath was then removed. Final catheter positioning was confirmed with a fluoroscopic radiographic image. The access incision was closed with 3 0 Vicryl suture and Dermabond. A 0 silk retention suture was applied at the catheter exit site. Large volume thoracentesis was performed through the new catheter utilizing provided bulb vacuum assisted drainage bag. Dressings were applied. The patient tolerated the above procedure well without immediate postprocedural complication. FINDINGS: Preprocedural ultrasound scanning demonstrates a recurrent large sized right sided pleural effusion. After  ultrasound and fluoroscopic guided placement, the catheter is directed medially and superiorly. Following catheter placement, approximately 1,600 cc of translucent, port colored pleural fluid was removed. IMPRESSION: Successful placement of permanent, tunneled right pleural drainage catheter via lateral approach. Approximately 1.6 liters of pleural fluid was removed following catheter placement. Marliss Coots, MD [facility] Radiology Electronically Signed   By: Marliss Coots M.D.   On: 05/02/2023 10:03   IR IMAGING GUIDED PORT INSERTION  Result  Date: 05/02/2023 INDICATION: 62 year old male with history of advanced stage right lung cancer presenting for Port-A-Cath placement. EXAM: IMPLANTED PORT A CATH PLACEMENT WITH ULTRASOUND AND FLUOROSCOPIC GUIDANCE COMPARISON:  None Available. MEDICATIONS: None. ANESTHESIA/SEDATION: Moderate (conscious) sedation was employed during this procedure. A total of Versed 2 mg and Fentanyl 100 mcg was administered intravenously. Moderate Sedation Time: 36 minutes. The patient's level of consciousness and vital signs were monitored continuously by radiology nursing throughout the procedure under my direct supervision. CONTRAST:  None FLUOROSCOPY TIME:  Two mGy COMPLICATIONS: None immediate. PROCEDURE: The procedure, risks, benefits, and alternatives were explained to the patient. Questions regarding the procedure were encouraged and answered. The patient understands and consents to the procedure. The right neck and chest were prepped with chlorhexidine in a sterile fashion, and a sterile drape was applied covering the operative field. Maximum barrier sterile technique with sterile gowns and gloves were used for the procedure. A timeout was performed prior to the initiation of the procedure. Ultrasound was used to examine the jugular vein which was compressible and free of internal echoes. A skin marker was used to demarcate the  planned venotomy and port pocket incision sites. Local anesthesia was provided to these sites and the subcutaneous tunnel track with 1% lidocaine with 1:100,000 epinephrine. A small incision was created at the jugular access site and blunt dissection was performed of the subcutaneous tissues. Under ultrasound guidance, the jugular vein was accessed with a 21 ga micropuncture needle and an 0.018" wire was inserted to the superior vena cava. Real-time ultrasound guidance was utilized for vascular access including the acquisition of a permanent ultrasound image documenting patency of the accessed vessel. A 5 Fr micopuncture set was then used, through which a 0.035" Rosen wire was passed under fluoroscopic guidance into the inferior vena cava. An 8 Fr dilator was then placed over the wire. A subcutaneous port pocket was then created along the upper chest wall utilizing a combination of sharp and blunt dissection. The pocket was irrigated with sterile saline, packed with gauze, and observed for hemorrhage. A single lumen standard sized power injectable port was chosen for placement. The 8 Fr catheter was tunneled from the port pocket site to the venotomy incision. The port was placed in the pocket. The external catheter was trimmed to appropriate length. The dilator was exchanged for an 8 Fr peel-away sheath under fluoroscopic guidance. The catheter was then placed through the sheath and the sheath was removed. Final catheter positioning was confirmed and documented with a fluoroscopic spot radiograph. The port was accessed with a Huber needle, aspirated, and flushed with heparinized saline. The deep dermal layer of the port pocket incision was closed with interrupted 3-0 Vicryl suture. Dermabond was then placed over the port pocket and neck incisions. The patient tolerated the procedure well without immediate post procedural complication. FINDINGS: After catheter placement, the tip lies within the superior cavoatrial  junction. The catheter aspirates and flushes normally and is ready for immediate use. IMPRESSION: Successful placement of a power injectable Port-A-Cath via the right internal jugular vein. The catheter is ready for immediate use. Marliss Coots, MD [facility] Radiology Electronically Signed   By: Marliss Coots M.D.   On: 05/02/2023 10:00   US Venous Img Lower Unilateral Left  Result Date: 04/28/2023 CLINICAL DATA:  Leg swelling EXAM: Left LOWER EXTREMITY VENOUS DOPPLER ULTRASOUND TECHNIQUE: Gray-scale sonography with compression, as well as color and duplex ultrasound, were performed to evaluate the deep venous system(s) from  the level of the common femoral vein through the popliteal and proximal calf veins. COMPARISON:  None Available. FINDINGS: VENOUS Normal compressibility of the common femoral, superficial femoral, and popliteal veins, as well as the visualized calf veins. Visualized portions of profunda femoral vein and great saphenous vein unremarkable. No filling defects to suggest DVT on grayscale or color Doppler imaging. Doppler waveforms show normal direction of venous flow, normal respiratory plasticity and response to augmentation. Limited views of the contralateral common femoral vein are unremarkable. OTHER There is slow flow seen in the left popliteal vein. Limitations: none IMPRESSION: No evidence of left lower extremity DVT Electronically Signed   By: Karen Kays M.D.   On: 04/28/2023 15:58   MR Brain W Wo Contrast  Result Date: 04/22/2023 CLINICAL DATA:  Initial staging for lung nodules. Headaches. History of lymphoma. EXAM: MRI HEAD WITHOUT AND WITH CONTRAST TECHNIQUE: Multiplanar, multiecho pulse sequences of the brain and surrounding structures were obtained without and with intravenous contrast. CONTRAST:  8.5 mL Vueway COMPARISON:  Head MRI 06/01/2011.  PET-CT 04/19/2023. FINDINGS: Brain: There is no evidence of an acute infarct,  intracranial hemorrhage, midline shift, or extra-axial fluid collection. Scattered small T2 hyperintensities in the cerebral white matter have mildly progressed from 2012 and are nonspecific but compatible with mild chronic small vessel ischemic disease. A chronic lacunar infarct is noted in the right corona radiata. The ventricles and sulci are within normal limits for age. A cavum septum pellucidum et vergae is incidentally noted. There is a 3 mm round enhancing lesion in the left cerebellar hemisphere without edema (series 119, image 31). Vascular: Major intracranial vascular flow voids are preserved. Skull and upper cervical spine: As seen on PET-CT, there is an approximately 5 cm destructive mass centered in the left occipital bone at the skull base. There is associated dural thickening and enhancement overlying the inferior and lateral aspects of the left cerebellar hemisphere. Tumor involves the left hypoglossal canal and jugular foramen. There is a separate 1 cm lesion in the clivus. Sinuses/Orbits: Unremarkable orbits. Moderate mucosal thickening and minimal fluid in the left maxillary sinus. Moderate left mastoid fluid. Other: None. IMPRESSION: 1. 3 mm enhancing lesion in the left cerebellar hemisphere suspicious for a metastasis. 2. 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 3. Additional small lesion in the clivus. Electronically Signed   By: Sebastian Ache M.D.   On: 04/22/2023 08:05   NM PET Image Initial (PI) Skull Base To Thigh  Result Date: 04/20/2023 CLINICAL DATA:  Initial treatment strategy for pulmonary nodules. Prior history of lymphoma. EXAM: NUCLEAR MEDICINE PET SKULL BASE TO THIGH TECHNIQUE: 9.22 mCi F-18 FDG was injected intravenously. Full-ring PET imaging was performed from the skull base to thigh after the radiotracer. CT data was obtained and used for attenuation correction and anatomic localization. Fasting blood glucose: 113 mg/dl COMPARISON:  Chest CT 16/09/9603  FINDINGS: Mediastinal blood pool activity: SUV max 2.43 Liver activity: SUV max 2.70 NECK: No neck mass or cervical lymphadenopathy. Incidental CT findings: None. CHEST: Small lymph node noted in the left supraclavicular fossa with SUV max of 2.3. There are also small supraclavicular and subclavicular lymph nodes on the left side which are hypermetabolic. SUV max is 2.86 The right lung is completely obstructed/drowned with a abrupt cut off of the right mainstem bronchus. There are numerous areas of hypermetabolism throughout the obstructed right lung. Right upper lobe hypermetabolism near the bronchial cut has an SUV max of 6.59. Findings consistent with a probable primary  lung neoplasm and diffuse pulmonary metastatic disease. There is also a large right pleural effusion and multiple hypermetabolic pleural lesions. The recent thoracentesis may yield a pathologic diagnosis. There is also mediastinal lymphadenopathy which is hypermetabolic. The largest upper right mediastinal disease measures a maximum of 23 mm and the SUV max is 5.70. Diffuse pulmonary nodules throughout the aerated left lung. The larger nodules demonstrate hypermetabolism in her consistent with metastatic disease. Incidental CT findings: The heart is normal in size. No pericardial effusion. There are age advanced aortic and three-vessel coronary artery calcifications. ABDOMEN/PELVIS: No findings to suggest hepatic metastatic disease. There is a 2.2 cm left adrenal gland lesion demonstrating mild hypermetabolism with SUV max of 3.76 which is likely a metastatic focus. No enlarged or hypermetabolic mesenteric or retroperitoneal nodes to suggest recurrent lymphoma or metastatic adenopathy. No pelvic adenopathy. Incidental CT findings: Significant age advanced atherosclerotic calcification involving the aorta and branch vessels but no aneurysm. SKELETON: Diffuse osseous metastatic disease. There is a large destructive lesion involving the left  occipital bone at the skull base with SUV max of 6.53. Left-sided sternal lesion has an SUV max of 5.87. Left fifth anterior rib has an SUV max of 6.32. Multiple vertebral body lesions without spinal canal compromise. T9 lesion has an SUV max of 4.89. L1 lesion has an SUV max of 6.56. Large destructive central sacral lesion has an SUV max of 5.50. Large destructive left hip lesion has an SUV max of 4.75. Findings highly worrisome for impending fracture. Right hip lesion at the level of the lesser trochanter has an SUV max of 5.22. Incidental CT findings: None. IMPRESSION: 1. Obstructed/drowned right lung with numerous areas of hypermetabolism likely due to a primary lung neoplasm. 2. Large right pleural effusion with multiple hypermetabolic pleural lesions. The recent thoracentesis may yield a pathologic diagnosis. If not, biopsy of a left supraclavicular node may be the easiest and safest tissue diagnosis. 3. Diffuse pulmonary metastatic disease. 4. Hypermetabolic supraclavicular and mediastinal lymphadenopathy. 5. Left adrenal gland lesion is likely a metastatic lesion. 6. Diffuse osseous metastatic disease. The left hip lesion is highly worrisome for impending fracture. Large destructive lesion at the left skull base. Vertebral body lesions but no spinal canal compromise. 7. Age advanced atherosclerotic calcification involving the aorta and three-vessel coronary artery calcifications. Aortic Atherosclerosis (ICD10-I70.0). Electronically Signed   By: Rudie Meyer M.D.   On: 04/20/2023 16:38   US THORACENTESIS ASP PLEURAL SPACE W/IMG GUIDE  Result Date: 04/20/2023 INDICATION: History of lymphoma found to have right-sided recurrent pleural effusion. Request received for diagnostic and therapeutic right thoracentesis. EXAM: ULTRASOUND GUIDED DIAGNOSTIC AND THERAPEUTIC RIGHT THORACENTESIS MEDICATIONS: 10 mL 1 % lidocaine COMPLICATIONS: None immediate. PROCEDURE: An ultrasound guided thoracentesis was thoroughly  discussed with the patient and questions answered. The benefits, risks, alternatives and complications were also discussed. The patient understands and wishes to proceed with the procedure. Written consent was obtained. Ultrasound was performed to localize and mark an adequate pocket of fluid in the right chest. The area was then prepped and draped in the normal sterile fashion. 1% Lidocaine was used for local anesthesia. Under ultrasound guidance a 6 Fr Safe-T-Centesis catheter was introduced. Thoracentesis was performed. The catheter was removed and a dressing applied. FINDINGS: A total of approximately 1.2 L of hazy, amber fluid was removed. Samples were sent to the laboratory as requested by the clinical team. IMPRESSION: Successful ultrasound guided right thoracentesis yielding 1.2 L of pleural fluid. Read by: Alex Gardener, AGNP-BC Electronically Signed  By: Olive Bass M.D.   On: 04/20/2023 16:16   DG Chest Port 1 View  Result Date: 04/20/2023 CLINICAL DATA:  Right pleural effusion.  Status post thoracentesis. EXAM: PORTABLE CHEST 1 VIEW COMPARISON:  Chest x-ray 04/11/2023 and recent PET-CT 04/19/2023. FINDINGS: Persistent complete opacification of the right hemithorax with a bronchial cut off sign suggesting diffuse drowned/obstructed right lung. I do not see any obvious hypermetabolic central lung mass on the PET-CT. Recommend bronchoscopic evaluation. The left lung demonstrates diffuse interstitial and airspace process and innumerable pulmonary nodules. IMPRESSION: Persistent complete opacification of the right hemithorax despite the thoracentesis with a bronchial cut off sign suggesting diffuse drowned/obstructed right lung. Recommend bronchoscopic evaluation. Electronically Signed   By: Rudie Meyer M.D.   On: 04/20/2023 16:13   US THORACENTESIS ASP PLEURAL SPACE W/IMG GUIDE  Result Date: 04/11/2023 INDICATION: Patient with history of lymphoma. Found to have right-sided pleural effusion with  diffuse left lobe lung nodules consistent with underlying malignancy yet to be determined. Patient presents for therapeutic and diagnostic thoracentesis EXAM: ULTRASOUND GUIDED THERAPEUTIC AND DIAGNOSTIC THORACENTESIS MEDICATIONS: Lidocaine 1% 10 mL COMPLICATIONS: None immediate. PROCEDURE: An ultrasound guided thoracentesis was thoroughly discussed with the patient and questions answered. The benefits, risks, alternatives and complications were also discussed. The patient understands and wishes to proceed with the procedure. Written consent was obtained. Ultrasound was performed to localize and mark an adequate pocket of fluid in the right chest. The area was then prepped and draped in the normal sterile fashion. 1% Lidocaine was used for local anesthesia. Under ultrasound guidance a 6 Fr Safe-T-Centesis catheter was introduced. Thoracentesis was performed. The catheter was removed and a dressing applied. FINDINGS: A total of approximately 1 L of amber color fluid was removed. Samples were sent to the laboratory as requested by the clinical team. IMPRESSION: Successful ultrasound guided right-sided therapeutic and diagnostic thoracentesis yielding 1 L of pleural fluid. Read by: Anders Grant, NP Electronically Signed   By: Malachy Moan M.D.   On: 04/11/2023 16:10   DG Chest Port 1 View  Result Date: 04/11/2023 CLINICAL DATA:  Right pleural effusion status post thoracentesis. 1 L of fluid drained from right pleural space earlier today. EXAM: PORTABLE CHEST 1 VIEW COMPARISON:  AP chest 04/06/2023, chest two views 11/15/2017; CT chest 03/31/2023 FINDINGS: There is again complete homogeneous opacification of the right hemithorax. Mild rightward mediastinal shift is similar to 04/06/2023 and 03/31/2023 CT, likely due to the compressive atelectasis associated with a large right pleural effusion. Diffuse left lung moderate interstitial thickening and reticulonodular opacities are similar to prior. No  definite left pleural effusion, noting no pleural effusion was seen on 04/06/2023 radiographs but a small pleural effusion was seen on 03/31/2023 CT. No pneumothorax. Visualized cardiac silhouette and mediastinal contours are unremarkable. Mild-to-moderate atherosclerotic calcification within the aortic arch. No acute skeletal abnormality. IMPRESSION: 1. Complete homogeneous opacification of the right hemithorax, similar to 04/06/2023 and 03/31/2023 CT, likely due to the compressive atelectasis associated with persistent large right pleural effusion. Note is made of right-sided thoracentesis earlier today, and the persistent right hemithorax homogeneous opacification is similar to that seen on 04/06/2023 radiograph following thoracentesis earlier that day as well. 2. Diffuse left lung moderate interstitial thickening and reticulonodular opacities, similar to prior. Appearance on prior CT was suggestive of possible neoplastic etiology including lymphoma given patient history of lymphoma. Electronically Signed   By: Neita Garnet M.D.   On: 04/11/2023 15:45   US THORACENTESIS ASP PLEURAL SPACE W/IMG GUIDE  Result Date: 04/06/2023 INDICATION: Patient with a history of right pleural effusion and diffuse left lung nodules consistent with underlying malignancy. Patient also has a history of lymphoma. Interventional radiology asked to perform a diagnostic and therapeutic thoracentesis. EXAM: ULTRASOUND GUIDED THORACENTESIS MEDICATIONS: 1% lidocaine 10 mL COMPLICATIONS: None immediate. PROCEDURE: An ultrasound guided thoracentesis was thoroughly discussed with the patient and questions answered. The benefits, risks, alternatives and complications were also discussed. The patient understands and wishes to proceed with the procedure. Written consent was obtained. Ultrasound was performed to localize and mark an adequate pocket of fluid in the right chest. The area was then prepped and draped in the normal sterile fashion.  1% Lidocaine was used for local anesthesia. Under ultrasound guidance a 6 Fr Safe-T-Centesis catheter was introduced. Thoracentesis was performed. The catheter was removed and a dressing applied. FINDINGS: A total of approximately 1.5 L of amber/blood-tinged fluid was removed. Samples were sent to the laboratory as requested by the clinical team. IMPRESSION: Successful ultrasound guided right thoracentesis yielding 1.5 L of pleural fluid. Read by: Alwyn Ren, NP Electronically Signed   By: Olive Bass M.D.   On: 04/06/2023 16:13   DG Chest Port 1 View  Result Date: 04/06/2023 CLINICAL DATA:  post thoracentesis, large right EXAM: PORTABLE CHEST 1 VIEW COMPARISON:  None Available. FINDINGS: There remains almost complete opacification of the right hemithorax status post thoracentesis. No pneumothorax. There is slight rightward shift of the trachea suggestive of volume loss. There is diffuse reticulonodular opacities of the left lung compatible with known extensive disease of the left lung. No appreciable left pleural effusion. No acute osseous abnormality. IMPRESSION: Persistent almost complete opacification of the right lung compatible with persistent large effusion despite recent thoracentesis. Similar extensive disease of the left lung. Electronically Signed   By: Olive Bass M.D.   On: 04/06/2023 15:42    PERFORMANCE STATUS (ECOG) : 1 - Symptomatic but completely ambulatory  Review of Systems Unless otherwise noted, a complete review of systems is negative.  Physical Exam General: NAD Pulmonary: Unlabored Extremities: no edema, no joint deformities Skin: no rashes Neurological: Weakness but otherwise nonfocal  IMPRESSION: Patient is status post Pleurx catheter placement after thoracentesis x 3 with recurrent malignant pleural effusion.  I met with patient and wife while he was receiving chemotherapy.    Patient recently diagnosed with stage IV lung cancer with plan for CarboTaxol  plus Bev.  Patient in agreement with current scope of treatment.  At baseline, he lives at home with his wife.  He reports that he is mostly independent with his care.  He ambulates with use of a walker.  He denies any falls.  Reports fair oral intake.  Discussed importance of high-calorie/high-protein foods.  Recommended oral nutritional supplements.  Symptomatically, he has had pain, mostly in his head at known bone lesion at skull base.  However, patient reports improvement in overall pain on transdermal fentanyl.  He has oxycodone IR to take as needed for breakthrough pain but states that he has not really needed much of that recently.  Note plan for XRT treatments to spine and skull base.  PLAN: -Continue current scope of treatment -Continue fentanyl/oxycodone -Daily bowel regimen -Oral nutritional supplements -Patient followed by nutrition -Patient will benefit from future conversation regarding ACP planning -Follow-up telephone visit 1 month   Patient expressed understanding and was in agreement with this plan. He also understands that He can call the clinic at any time with any questions, concerns, or complaints.  Time Total: 20 minutes  Visit consisted of counseling and education dealing with the complex and emotionally intense issues of symptom management and palliative care in the setting of serious and potentially life-threatening illness.Greater than 50%  of this time was spent counseling and coordinating care related to the above assessment and plan.  Signed by: Laurette Schimke, PhD, NP-C

## 2023-05-06 NOTE — Progress Notes (Signed)
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 Past medical history of b-cell lymphoma, afib, CAD, DM, HLD, HTN, stroke.  Patient receiving carbo, taxol, avastin.  Starting palliative radiation to pelvis and left hip.    Met with patient and wife during infusion.  Patient sleepy during visit.  Wife reports shortness of breath during eating.  Reports poor appetite, eating less than usual intake.  Breakfast maybe cereal or yogurt or eggs, sausage and toast or 1/2 sandwich.  Lunch maybe leftovers or sandwich.  Dinner is usually meat and couple of sides. Last night it was spaghetti.  Wife recently purchased some boost shakes for patient to try.  Has had some nausea.  Bowels are moving but slower than before. Taking stool softner to help.     Medications: compazine, zofran, jardiance   Labs: glucose 254    Anthropometrics:   Height: 70 inches Weight: 167 lb 6.4 oz today Patient reports 205 around Thanksgiving BMI: 24  19% weight loss in the last 6 months   Estimated Energy Needs  Kcals: 1610-9604 Protein: 95-114 g Fluid: 1900-2280 ml   NUTRITION DIAGNOSIS: Inadequate oral intake related to cancer and related treatment side effects as evidenced by 19% weight loss in the last 6 months as evidenced by 19% weight loss in the last 6 months and decreased intake    INTERVENTION:  Discussed ways to add calories to diet without increasing blood glucose.  Handout provided Would encourage liberalizing diet and adjusting DM medications if glucose runs high.  Continue oral nutrition supplements Encouraged eating q 2 hours If no improvement in weight, appetite could consider trial of appetite stimulant Contact information provided   MONITORING, EVALUATION, GOAL: weight trends, intake   Next Visit: Wednesday, June 5th after radiation  Adylynn Hertenstein B. Freida Busman, RD, LDN Registered  Dietitian [phone]

## 2023-05-09 ENCOUNTER — Ambulatory Visit: Payer: Managed Care, Other (non HMO)

## 2023-05-09 ENCOUNTER — Other Ambulatory Visit: Payer: Self-pay

## 2023-05-09 ENCOUNTER — Encounter: Payer: Self-pay | Admitting: *Deleted

## 2023-05-09 ENCOUNTER — Emergency Department: Payer: Managed Care, Other (non HMO)

## 2023-05-09 ENCOUNTER — Observation Stay
Admission: EM | Admit: 2023-05-09 | Discharge: 2023-05-11 | Disposition: A | Discharge status: Home / Self Care | Payer: Managed Care, Other (non HMO) | Attending: Obstetrics and Gynecology | Admitting: Obstetrics and Gynecology

## 2023-05-09 ENCOUNTER — Inpatient Hospital Stay: Payer: Managed Care, Other (non HMO)

## 2023-05-09 DIAGNOSIS — C7951 Secondary malignant neoplasm of bone: Secondary | ICD-10-CM

## 2023-05-09 DIAGNOSIS — I4891 Unspecified atrial fibrillation: Secondary | ICD-10-CM | POA: Diagnosis not present

## 2023-05-09 DIAGNOSIS — C8330 Diffuse large B-cell lymphoma, unspecified site: Secondary | ICD-10-CM | POA: Diagnosis present

## 2023-05-09 DIAGNOSIS — I10 Essential (primary) hypertension: Secondary | ICD-10-CM | POA: Insufficient documentation

## 2023-05-09 DIAGNOSIS — Z7901 Long term (current) use of anticoagulants: Secondary | ICD-10-CM | POA: Diagnosis not present

## 2023-05-09 DIAGNOSIS — F17210 Nicotine dependence, cigarettes, uncomplicated: Secondary | ICD-10-CM | POA: Insufficient documentation

## 2023-05-09 DIAGNOSIS — I2510 Atherosclerotic heart disease of native coronary artery without angina pectoris: Secondary | ICD-10-CM | POA: Diagnosis not present

## 2023-05-09 DIAGNOSIS — Z85118 Personal history of other malignant neoplasm of bronchus and lung: Secondary | ICD-10-CM | POA: Insufficient documentation

## 2023-05-09 DIAGNOSIS — Z8673 Personal history of transient ischemic attack (TIA), and cerebral infarction without residual deficits: Secondary | ICD-10-CM | POA: Diagnosis not present

## 2023-05-09 DIAGNOSIS — F17219 Nicotine dependence, cigarettes, with unspecified nicotine-induced disorders: Secondary | ICD-10-CM | POA: Diagnosis present

## 2023-05-09 DIAGNOSIS — Z79899 Other long term (current) drug therapy: Secondary | ICD-10-CM | POA: Insufficient documentation

## 2023-05-09 DIAGNOSIS — Z85828 Personal history of other malignant neoplasm of skin: Secondary | ICD-10-CM | POA: Insufficient documentation

## 2023-05-09 DIAGNOSIS — C3491 Malignant neoplasm of unspecified part of right bronchus or lung: Secondary | ICD-10-CM | POA: Diagnosis present

## 2023-05-09 DIAGNOSIS — R002 Palpitations: Secondary | ICD-10-CM | POA: Diagnosis present

## 2023-05-09 DIAGNOSIS — E119 Type 2 diabetes mellitus without complications: Secondary | ICD-10-CM | POA: Diagnosis not present

## 2023-05-09 DIAGNOSIS — M7989 Other specified soft tissue disorders: Secondary | ICD-10-CM

## 2023-05-09 DIAGNOSIS — E785 Hyperlipidemia, unspecified: Secondary | ICD-10-CM | POA: Diagnosis present

## 2023-05-09 LAB — CBC
HCT: 46.7 % (ref 39.0–52.0)
Hemoglobin: 14.6 g/dL (ref 13.0–17.0)
MCH: 26.6 pg (ref 26.0–34.0)
MCHC: 31.3 g/dL (ref 30.0–36.0)
MCV: 85.1 fL (ref 80.0–100.0)
Platelets: 255 10*3/uL (ref 150–400)
RBC: 5.49 MIL/uL (ref 4.22–5.81)
RDW: 15.5 % (ref 11.5–15.5)
WBC: 10.6 10*3/uL — ABNORMAL HIGH (ref 4.0–10.5)
nRBC: 0 % (ref 0.0–0.2)

## 2023-05-09 LAB — TSH: TSH: 0.917 u[IU]/mL (ref 0.350–4.500)

## 2023-05-09 LAB — TROPONIN I (HIGH SENSITIVITY)
Troponin I (High Sensitivity): 46 ng/L — ABNORMAL HIGH (ref <18)
Troponin I (High Sensitivity): 40 ng/L — ABNORMAL HIGH (ref <18)

## 2023-05-09 LAB — BASIC METABOLIC PANEL
Anion gap: 12 (ref 5–15)
BUN: 20 mg/dL (ref 8–23)
CO2: 23 mmol/L (ref 22–32)
Calcium: 8.5 mg/dL — ABNORMAL LOW (ref 8.9–10.3)
Chloride: 97 mmol/L — ABNORMAL LOW (ref 98–111)
Creatinine, Ser: 0.75 mg/dL (ref 0.61–1.24)
GFR, Estimated: >60 mL/min (ref >60)
Glucose, Bld: 232 mg/dL — ABNORMAL HIGH (ref 70–99)
Potassium: 4.7 mmol/L (ref 3.5–5.1)
Sodium: 132 mmol/L — ABNORMAL LOW (ref 135–145)

## 2023-05-09 LAB — PROTIME-INR
INR: 1.3 — ABNORMAL HIGH (ref 0.8–1.2)
Prothrombin Time: 16.3 seconds — ABNORMAL HIGH (ref 11.4–15.2)

## 2023-05-09 LAB — MAGNESIUM: Magnesium: 2.3 mg/dL (ref 1.7–2.4)

## 2023-05-09 MED ORDER — FENTANYL 25 MCG/HR TD PT72: 1.0000 | MEDICATED_PATCH | TRANSDERMAL | Status: DC

## 2023-05-09 MED ORDER — NICOTINE 7 MG/24HR TD PT24
7.0000 mg | MEDICATED_PATCH | Freq: Every day | TRANSDERMAL | Status: DC
  Administered 2023-05-10 – 2023-05-11 (×2): 7 mg via TRANSDERMAL
  Filled 2023-05-09 (×3): qty 1

## 2023-05-09 MED ORDER — AMIODARONE HCL IN DEXTROSE 360-4.14 MG/200ML-% IV SOLN: 30.0000 mg/h | INTRAVENOUS | Status: DC

## 2023-05-09 MED ORDER — AMIODARONE LOAD VIA INFUSION
150.0000 mg | Freq: Once | INTRAVENOUS | Status: DC
  Filled 2023-05-09: qty 83.34

## 2023-05-09 MED ORDER — ROSUVASTATIN CALCIUM 10 MG PO TABS
10.0000 mg | ORAL_TABLET | Freq: Every day | ORAL | Status: DC
  Administered 2023-05-10 – 2023-05-11 (×2): 10 mg via ORAL
  Filled 2023-05-09 (×2): qty 1

## 2023-05-09 MED ORDER — INSULIN ASPART 100 UNIT/ML IJ SOLN
0.0000 [IU] | Freq: Three times a day (TID) | INTRAMUSCULAR | Status: DC
  Administered 2023-05-10: 1 [IU] via SUBCUTANEOUS
  Administered 2023-05-10 (×2): 2 [IU] via SUBCUTANEOUS
  Filled 2023-05-09 (×2): qty 1

## 2023-05-09 MED ORDER — DILTIAZEM HCL 25 MG/5ML IV SOLN
10.0000 mg | Freq: Once | INTRAVENOUS | Status: AC
  Administered 2023-05-09: 10 mg via INTRAVENOUS
  Filled 2023-05-09: qty 5

## 2023-05-09 MED ORDER — LACTATED RINGERS IV BOLUS
1000.0000 mL | Freq: Once | INTRAVENOUS | Status: AC
  Administered 2023-05-09: 1000 mL via INTRAVENOUS

## 2023-05-09 MED ORDER — APIXABAN 5 MG PO TABS
5.0000 mg | ORAL_TABLET | Freq: Two times a day (BID) | ORAL | Status: DC
  Administered 2023-05-09 – 2023-05-11 (×4): 5 mg via ORAL
  Filled 2023-05-09 (×4): qty 1

## 2023-05-09 MED ORDER — AMIODARONE LOAD VIA INFUSION
150.0000 mg | Freq: Once | INTRAVENOUS | Status: AC
  Administered 2023-05-09: 150 mg via INTRAVENOUS
  Filled 2023-05-09: qty 83.34

## 2023-05-09 MED ORDER — EMPAGLIFLOZIN 25 MG PO TABS
25.0000 mg | ORAL_TABLET | Freq: Every day | ORAL | Status: DC
  Administered 2023-05-10 – 2023-05-11 (×2): 25 mg via ORAL
  Filled 2023-05-09 (×2): qty 1

## 2023-05-09 MED ORDER — METHOCARBAMOL 1000 MG/10ML IJ SOLN: 500.0000 mg | Freq: Four times a day (QID) | INTRAVENOUS | Status: DC | PRN

## 2023-05-09 MED ORDER — AMIODARONE HCL IN DEXTROSE 360-4.14 MG/200ML-% IV SOLN
60.0000 mg/h | INTRAVENOUS | Status: DC
  Administered 2023-05-09: 60 mg/h via INTRAVENOUS
  Filled 2023-05-09: qty 200

## 2023-05-09 MED ORDER — PROCHLORPERAZINE MALEATE 10 MG PO TABS: 10.0000 mg | ORAL_TABLET | Freq: Four times a day (QID) | ORAL | Status: DC | PRN

## 2023-05-09 MED ORDER — SENNOSIDES-DOCUSATE SODIUM 8.6-50 MG PO TABS
1.0000 | ORAL_TABLET | Freq: Every evening | ORAL | Status: DC | PRN
  Administered 2023-05-10: 1 via ORAL
  Filled 2023-05-09: qty 1

## 2023-05-09 MED ORDER — TRAZODONE HCL 50 MG PO TABS: 25.0000 mg | ORAL_TABLET | Freq: Every evening | ORAL | Status: DC | PRN

## 2023-05-09 MED ORDER — METOPROLOL SUCCINATE ER 50 MG PO TB24: 50.0000 mg | ORAL_TABLET | Freq: Every day | ORAL | Status: DC

## 2023-05-09 MED ORDER — INSULIN ASPART 100 UNIT/ML IJ SOLN: 0.0000 [IU] | Freq: Every day | INTRAMUSCULAR | Status: DC

## 2023-05-09 MED ORDER — OXYCODONE HCL 5 MG PO TABS: 5.0000 mg | ORAL_TABLET | Freq: Four times a day (QID) | ORAL | Status: DC | PRN

## 2023-05-09 NOTE — ED Triage Notes (Signed)
Pt sts that he arrived via his PCP. Pt has a hx of Afib with RVR in the past and takes metoprolol.

## 2023-05-09 NOTE — Progress Notes (Signed)
Received call from pt that he has not been feeling well over the past few days since receiving first dose of chemo on Friday 5/17. Reports elevated heart rate ranging 140-160. Pt states that has history of arrhythmia. Per Dr. Orlie Dakin, pt needs further evaluation at the ED due to elevated HR and pt's cardiac history. Left message with patient regarding MD recommendations.

## 2023-05-09 NOTE — H&P (Signed)
History and Physical    MORTON ELDERKIN ZOX:[id number] DOB: 12-Feb-1961 DOA: 05/09/2023  PCP: Dorothey Baseman, MD  Patient coming from: Home  Chief Complaint: Odd feeling, palpitations  HPI: Charles Hickman is a 62 y.o. 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  Per Charles Hickman, yesterday he developed an odd feeling in his chest and noted that his pulse was elevated.  He was SOB while his pulse was elevated.  He was told to come to the ED and was found to be in Afib with RVR.  In the ED, he received IV diltiazem and a bolus of amiodarone and he converted to sinus rhythm in the 90s.  He has some chronic issues including nausea, low appetite which he associates with his cancer treatment.  He further has a chronic cough, swelling in his legs which has been going on for a few weeks, left before right and related to his XRT treatment.  Overall, he is feeling better since he converted to sinus rhythm.  EDP reported that she spoke with Cardiology, Dr. Duke Salvia, who recommended monitoring overnight on telemetry and then discharge if remains in sinus rhythm  ED Course: Labs from the ED showed a Na of 132, Cal of 8.5, Albumin (from 5/16) of 3.1, TnI of 46, WBC of 10.6, INR of 1.3 (on eliquis) and TSH of 0.9 (normal).  CXR showed persistent fluid in the right hemi thorax and other nodular changes which are not different from previous. US of the LE bilaterally did not show a DVT.  EKGs showed significant flutter vs. Fib, then SVT (sinus tachycardia) followed by most recent which showed sinus rhythm.   Review of Systems: As per HPI otherwise all other systems reviewed and are negative.   Past Medical History:  Diagnosis Date   Abnormal EKG 11/15/2017   Abnormal stress test    Atrial fibrillation (HCC)    CAD in native artery    a.  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 No EKG changes concerning for ischemia at peak stress or in recovery. Mod to high risk scan; b. LHC 12/15/17: pLAD 75% w/ FFR 0.74 s/p PCI/DES, mRCA 90%, CTO dRCA w/ L-R colatts, EF 45-50%, inf HK    Diabetes mellitus (HCC)    HCV (hepatitis C virus)    HLD (hyperlipidemia)    Hypertension    Large cell lymphoma (HCC)    a. s/p 6 cycles of R-CHOP   Peripheral neuropathy    Stroke [facility])    Systolic dysfunction    a. TTE 8/16: EF > 55%, aortic sclerosis, normal RV systolic function; b. LHC 12/15/17: EF 45-50%, inf HK    Past Surgical History:  Procedure Laterality Date   APPENDECTOMY     CORONARY PRESSURE/FFR STUDY N/A 12/15/2017   Procedure: INTRAVASCULAR PRESSURE WIRE/FFR STUDY;  Surgeon: Iran Ouch, MD;  Location: [facility] INVASIVE CV LAB;  Service: Cardiovascular;  Laterality: N/A;   IR GUIDED DRAIN W CATHETER PLACEMENT  05/02/2023   IR IMAGING GUIDED PORT INSERTION  05/02/2023   IR US GUIDE BX ASP/DRAIN  05/02/2023   LEFT HEART CATH AND CORONARY ANGIOGRAPHY N/A 12/15/2017   Procedure: LEFT HEART CATH  AND CORONARY ANGIOGRAPHY;  Surgeon: Iran Ouch, MD;  Location: [facility] INVASIVE CV LAB;  Service: Cardiovascular;  Laterality: N/A;   TONSILLECTOMY AND ADENOIDECTOMY     He is currently only smoking about 1-2 cigarettes per day Social History  reports that he has been smoking cigarettes. He has been smoking an average of .5 packs per day. He has never used smokeless tobacco. He reports current alcohol use. He reports that he does not use drugs.  No Known Allergies  Family History  Problem Relation Age of Onset   Hypertension Mother    Hyperlipidemia Mother    Stroke Mother    Diabetes Mother    Kidney disease Father    Hypertension Father    Diabetes Father    Dementia Maternal Grandmother       Prior to Admission medications   Medication Sig Start Date End Date Taking? Authorizing Provider  apixaban (ELIQUIS) 5 MG TABS tablet TAKE 1 TABLET BY MOUTH TWICE A DAY 04/12/23   Antonieta Iba, MD  blood glucose meter kit and supplies KIT Dispense based on patient and insurance preference. Use 2 times daily as directed. (FOR ICD-10 E11.9). 09/29/20   Glori Luis, MD  fentaNYL (DURAGESIC) 25 MCG/HR Place 1 patch onto the skin every 3 (three) days. 04/21/23   Jeralyn Ruths, MD  JARDIANCE 25 MG TABS tablet TAKE 1 TABLET BY MOUTH EVERY DAY 02/22/22   Worthy Rancher B, FNP  lidocaine-prilocaine (EMLA) cream Apply to affected area once 04/28/23   Jeralyn Ruths, MD  metoprolol succinate (TOPROL-XL) 50 MG 24 hr tablet Take 1 tablet (50 mg total) by mouth daily. TAKE WITH OR IMMEDIATELY FOLLOWING A MEAL. 05/28/22   Antonieta Iba, MD  naloxone [facility]) nasal spray 4 mg/0.1 mL See admin instructions. Patient not taking: Reported on 05/06/2023 04/22/23   [provider]  nitroGLYCERIN (NITROSTAT) 0.4 MG SL tablet Place 1 tablet (0.4 mg total) under the tongue every 5 (five) minutes as needed for chest pain. Patient not taking: Reported on 05/06/2023 05/28/22   Antonieta Iba, MD         [facility] VERIO test strip USE TWICE A DAY AS DIRECTED 11/20/20   Glori Luis, MD  oxyCODONE (OXY IR/ROXICODONE) 5 MG immediate release tablet Take 1 tablet (5 mg total) by mouth every 6 (six) hours as needed for severe pain. 04/12/23   Jeralyn Ruths, MD         rosuvastatin (CRESTOR) 10 MG tablet Take 1 tablet (10 mg total) by mouth daily. 05/28/22   Antonieta Iba, MD    Physical Exam: Vitals:   05/09/23 1745 05/09/23 1752 05/09/23 1830 05/09/23 2000  BP:  101/82 97/73 112/80  Pulse: (!) 161 (!) 162 (!) 161 83  Resp: (!) 25 (!) 24  (!) 27  Temp:      TempSrc:      SpO2: 95% 92% 92% 90%  Weight:        Constitutional: NAD, calm, comfortable Eyes: lids and conjunctivae  normal ENMT: Mucous membranes are dry Neck: normal, supple Respiratory: Decreased breath sounds in the right base.  He has a pleurx catheter in the left anterior lower chest which is covered with a clean and dry bandage.  He has course breath sounds in the base of the left lung.  He has no wheezing.  Cardiovascular: Regular, normal rate.  He has no murmur.  He has pitting edema to bilateral LE, L> R and cool  extremities.  Pulses intact 2+ in radial bilaterally and 1+ in DP bilaterally.  He has a port in place on the right chest wall which is clean and dry, some bruising but non tender non erythematous Abdomen: +BS, NT, ND Musculoskeletal: normal tone, low bulk likely due to active cancer Skin: no rashes, lesions, ulcers on exposed skin Neurologic: Speech clear, grossly intact  Psychiatric: Normal judgment and insight. Alert and oriented x 3. Normal mood.    Labs on Admission: I have personally reviewed following labs and imaging studies  CBC: Recent Labs  Lab 05/05/23 0852 05/09/23 1649  WBC 9.8 10.6*  NEUTROABS 7.7  --   HGB 13.4 14.6  HCT 43.1 46.7  MCV 86.2 85.1  PLT 313 255    Basic Metabolic Panel: Recent Labs  Lab 05/05/23 0852 05/09/23 1649  NA 136 132*  K 4.2 4.7  CL 99 97*  CO2 26 23  GLUCOSE 245* 232*  BUN 16 20  CREATININE 0.85 0.75  CALCIUM 9.0 8.5*  MG  --  2.3    GFR: Estimated Creatinine Clearance: 96.3 mL/min (by C-G formula based on SCr of 0.75 mg/dL).  Liver Function Tests: Recent Labs  Lab 05/05/23 0852  AST 16  ALT 12  ALKPHOS 107  BILITOT 0.6  PROT 6.9  ALBUMIN 3.1*    Urine analysis:    Component Value Date/Time   COLORURINE YELLOW (A) 05/05/2023 0852   APPEARANCEUR CLEAR (A) 05/05/2023 0852   APPEARANCEUR Clear 10/25/2013 0417   LABSPEC 1.040 (H) 05/05/2023 0852   LABSPEC 1.019 10/25/2013 0417   PHURINE 5.0 05/05/2023 0852   GLUCOSEU >=500 (A) 05/05/2023 0852   GLUCOSEU 50 mg/dL 16/09/9603 5409   HGBUR NEGATIVE 05/05/2023  0852   BILIRUBINUR NEGATIVE 05/05/2023 0852   BILIRUBINUR Negative 10/25/2013 0417   KETONESUR NEGATIVE 05/05/2023 0852   PROTEINUR NEGATIVE 05/05/2023 0852   NITRITE NEGATIVE 05/05/2023 0852   LEUKOCYTESUR NEGATIVE 05/05/2023 0852   LEUKOCYTESUR Negative 10/25/2013 0417    Radiological Exams on Admission: US Venous Img Lower Bilateral  Result Date: 05/09/2023 CLINICAL DATA:  New LOWER extremity swelling.  History of cancer. EXAM: BILATERAL LOWER EXTREMITY VENOUS DOPPLER ULTRASOUND TECHNIQUE: Gray-scale sonography with compression, as well as color and duplex ultrasound, were performed to evaluate the deep venous system(s) from the level of the common femoral vein through the popliteal and proximal calf veins. COMPARISON:  04/28/2023 FINDINGS: VENOUS Normal compressibility of the common femoral, superficial femoral, and popliteal veins, as well as the visualized calf veins. Visualized portions of profunda femoral vein and great saphenous vein unremarkable. No filling defects to suggest DVT on grayscale or color Doppler imaging. Doppler waveforms show normal direction of venous flow, normal respiratory plasticity and response to augmentation. Limited views of the contralateral common femoral vein are unremarkable. OTHER None. Limitations: none IMPRESSION: Negative. Electronically Signed   By: Norva Pavlov M.D.   On: 05/09/2023 18:51   DG Chest 2 View  Result Date: 05/09/2023 CLINICAL DATA:  Chest pain EXAM: CHEST - 2 VIEW COMPARISON:  05/02/2023 FINDINGS: Right chest port and right PleurX catheter are stable in positioning. Persistent complete opacification of the right hemithorax. Volume loss within the right hemithorax. Heart size is obscured. Diffuse interstitial opacities throughout the left lung with nodularity. No left pleural effusion or pneumothorax. IMPRESSION: 1. Persistent complete opacification of the right hemithorax. 2. Diffuse interstitial opacities throughout the left lung with  nodularity. No significant interval change. Electronically Signed   By: Duanne Guess D.O.  On: 05/09/2023 17:38    EKG: Independently reviewed. EKGs showed significant flutter vs. Fib, then SVT (sinus tachycardia) followed by most recent which showed sinus rhythm.   Assessment/Plan  Atrial fibrillation with rapid ventricular response - Now converted - Monitor on telemetry - TSH WNL - Continue home metoprolol and eliquis - If converts, would consider formal Cardiology consult and possible a CT chest for PE evaluation given active cancer - Cardiology, Dr. Duke Salvia, discussed with EDP and recommended monitoring off of therapy to see if Afib recurs.  - Trend troponin - AM EKG  Adenocarcinoma, lung, right with bony metastasis - Currently on treatment with Carboplatin + paclitaxel + bevacizumab every 21 days - POrt in place, appears to be clean and dry - Pleurx in place - Also getting XRT to the left hip/femur - Would message primary Oncologist Dr. Orlie Dakin if remains in house  LE Swelling - He has a reported history of CHF, last EF on TTE was EF of 55-60% and grade 1 diastolic failure, this was in 1610 - Albumin is low and he is receiving XRT - Likely multifactorial - Consider repeat TTE if he remains in house  Essential hypertension - Continue metoprolol    Diabetes (HCC) - A1C ordered - Continue jardiance - SSI    Nicotine dependence, cigarettes, w unsp disorders - low dose nicotine patch ordered    HLD (hyperlipidemia) - Continue home statin    CAD (coronary artery disease), native coronary artery - Monitor for chest pain - Monitor on telemetry      DVT prophylaxis: Eliquis  Code Status:   Full  Disposition Plan:   Patient is from:  Home  Anticipated DC to:  Home  Anticipated DC date:  05/10/23  Anticipated DC barriers: Recurrence of Afib  Consults called:  Dr. Duke Salvia by EDP (CArdiology)  Admission status:  Obs, telemetry  Severity of Illness: The  appropriate patient status for this patient is OBSERVATION. Observation status is judged to be reasonable and necessary in order to provide the required intensity of service to ensure the patient's safety. The patient's presenting symptoms, physical exam findings, and initial radiographic and laboratory data in the context of their medical condition is felt to place them at decreased risk for further clinical deterioration. Furthermore, it is anticipated that the patient will be medically stable for discharge from the hospital within 2 midnights of admission.     Debe Coder MD [facility]  How to contact the [facility] Attending or Consulting provider 7A - 7P or covering provider during after hours 7P -7A, for this patient?   Check the care team in [facility] and look for a) attending/consulting [facility] provider listed and b) the [facility] team listed Log into [email or web address] and use [place]'s universal password to access. If you do not have the password, please contact the hospital operator. Locate the [facility] provider you are looking for under [facility] and page to a number that you can be directly reached. If you still have difficulty reaching the provider, please page the [facility] (Director on Call) for the Hospitalists listed on amion for assistance.  05/09/2023, 9:06 PM

## 2023-05-09 NOTE — ED Provider Notes (Signed)
[facility] Provider Note    Event Date/Time   First MD Initiated Contact with Patient 05/09/23 1715     (approximate)   History   Tachycardia   HPI  Charles Hickman is a 62 y.o. male with history of lymphoma, metastatic lung cancer recently started chemotherapy, atrial fibrillation, T2DM presenting to the emerged department for evaluation of palpitations.  Patient had his first chemo treatment on Friday.  He has felt fatigued since then.  Yesterday, he began to notice palpitations consistent with his prior episodes of A-fib.  In the past, he has never had episodes lasting more than a few hours, but his symptoms persisted today.  He spoke with his primary care doctor and was directed to the ER.  He denies chest pain, does report shortness of breath.  Additionally reports worsening swelling in his lower extremities initially primarily on his left side, more recently also on the right.      Physical Exam   Triage Vital Signs: ED Triage Vitals  Enc Vitals Group     BP 05/09/23 1647 (!) 89/71     Pulse Rate 05/09/23 1647 (!) 160     Resp 05/09/23 1647 17     Temp 05/09/23 1647 98.4 F (36.9 C)     Temp Source 05/09/23 1647 Oral     SpO2 05/09/23 1646 91 %     Weight 05/09/23 1647 165 lb (74.8 kg)     Height --      Head Circumference --      Peak Flow --      Pain Score 05/09/23 1647 0     Pain Loc --      Pain Edu? --      Excl. in GC? --     Most recent vital signs: Vitals:   05/09/23 2200 05/09/23 2230  BP: 108/80 102/73  Pulse: 96 92  Resp: (!) 26 (!) 21  Temp:    SpO2: 96% 97%     General: Awake, interactive  CV:  Tachycardic with irregularly irregular rhythm with rates ranging from the 160s to 190s, good peripheral perfusion Resp:  Significantly diminished lung sounds on the right, slightly diminished on the left, PleurX catheter in place with overlying dressing over the right side of the lung, no appreciable surrounding erythema or  drainage Abd:  Soft, nondistended.  Neuro:  Symmetric facial movement, fluid speech   ED Results / Procedures / Treatments   Labs (all labs ordered are listed, but only abnormal results are displayed) Labs Reviewed  BASIC METABOLIC PANEL - Abnormal; Notable for the following components:      Result Value   Sodium 132 (*)    Chloride 97 (*)    Glucose, Bld 232 (*)    Calcium 8.5 (*)    All other components within normal limits  CBC - Abnormal; Notable for the following components:   WBC 10.6 (*)    All other components within normal limits  PROTIME-INR - Abnormal; Notable for the following components:   Prothrombin Time 16.3 (*)    INR 1.3 (*)    All other components within normal limits  TROPONIN I (HIGH SENSITIVITY) - Abnormal; Notable for the following components:   Troponin I (High Sensitivity) 46 (*)    All other components within normal limits  TROPONIN I (HIGH SENSITIVITY) - Abnormal; Notable for the following components:   Troponin I (High Sensitivity) 40 (*)    All other components within normal limits  TSH  MAGNESIUM  HIV ANTIBODY (ROUTINE TESTING W REFLEX)  HEMOGLOBIN A1C  BASIC METABOLIC PANEL  CBC     EKG EKG independently reviewed interpreted by myself (ER attending) demonstrates:  EKG demonstrates atrial flutter at a rate of 161, QRS 86, QTc 471  RADIOLOGY Imaging independently reviewed and interpreted by myself demonstrates:  Chest x-Charles Hickman demonstrates opacification of the right lung likely from patient's known pleural effusion.  PROCEDURES:  Critical Care performed: Yes, see critical care procedure note(s)  CRITICAL CARE  Performed by: Trinna Post  Total critical care time: 35 minutes  Critical care time was exclusive of separately billable procedures and treating other patients.  Critical care was necessary to treat or prevent imminent or life-threatening deterioration.  Critical care was time spent personally by me on the following activities:  development of treatment plan with patient and/or surrogate as well as nursing, discussions with consultants, evaluation of patient's response to treatment, examination of patient, obtaining history from patient or surrogate, ordering and performing treatments and interventions, ordering and review of laboratory studies, ordering and review of radiographic studies, pulse oximetry and re-evaluation of patient's condition.  Procedures   MEDICATIONS ORDERED IN ED: Medications  fentaNYL (DURAGESIC) 25 MCG/HR 1 patch (has no administration in time range)  oxyCODONE (Oxy IR/ROXICODONE) immediate release tablet 5 mg (has no administration in time range)  metoprolol succinate (TOPROL-XL) 24 hr tablet 50 mg (has no administration in time range)  rosuvastatin (CRESTOR) tablet 10 mg (has no administration in time range)  prochlorperazine (COMPAZINE) tablet 10 mg (has no administration in time range)  empagliflozin (JARDIANCE) tablet 25 mg (has no administration in time range)  apixaban (ELIQUIS) tablet 5 mg (5 mg Oral Given 05/09/23 2313)  insulin aspart (novoLOG) injection 0-9 Units (has no administration in time range)  insulin aspart (novoLOG) injection 0-5 Units ( Subcutaneous Not Given 05/09/23 2310)  methocarbamol (ROBAXIN) 500 mg in dextrose 5 % 50 mL IVPB (has no administration in time range)  traZODone (DESYREL) tablet 25 mg (has no administration in time range)  senna-docusate (Senokot-S) tablet 1 tablet (has no administration in time range)  nicotine (NICODERM CQ - dosed in mg/24 hr) patch 7 mg (0 mg Transdermal Hold 05/09/23 2334)  lactated ringers bolus 1,000 mL (0 mLs Intravenous Stopped 05/09/23 1745)  diltiazem (CARDIZEM) injection 10 mg (10 mg Intravenous Given 05/09/23 1742)  amiodarone (NEXTERONE) 1.8 mg/mL load via infusion 150 mg (150 mg Intravenous Bolus from Bag 05/09/23 1829)     IMPRESSION / MDM / ASSESSMENT AND PLAN / ED COURSE  I reviewed the triage vital signs and the nursing  notes.  Differential diagnosis includes, but is not limited to, arrhythmia, anemia, electrolyte abnormality, consideration for infection though no clinical history suggestive of this  Patient's presentation is most consistent with acute presentation with potential threat to life or bodily function.  62 year old male with history of atrial fibrillation presenting with atrial arrhythmia with significantly elevated heart rates, initially with borderline blood pressure.  On my initial evaluation, blood pressure had improved to systolics in the 110s.  Patient is on Eliquis and reports he has been compliant with his medication.  I did discuss the possibility of cardioversion given his significant tachycardia.  Patient prefers to hold off on this.  With his improved blood pressure, do think it is reasonable to trial rate control.  10 mg of diltiazem ordered.  Additional labs pending.  Clinical Course as of 05/10/23 0000  Mon May 09, 2023  1811 Heart rate  still remains in the 160s to 170s with irregularly irregular rhythm after diltiazem.  BP hovering around a systolic of 100.  Will trial amiodarone bolus and drip for management of his A-fib with RVR. [NR]  1900 Heart rate improved to the 120s to 150s, remains in A-fib after 1 amiodarone bolus and drip.  Does seem to be improving with this, so we will trial another bolus with continuation of the drip.   [NR]  2002 Prior to receiving a another amiodarone bolus, patient did convert into sinus rhythm.  Blood pressure improved with systolic of 112.  Patient does feel improved on reevaluation.  Case discussed with Dr. Duke Salvia with cardiology.  She does recommend discontinuing the patient's amiodarone drip and admission for observation to ensure no recurrent episodes. [NR]    Clinical Course User Index [NR] Trinna Post, MD   Case discussed with hospitalist team.  Patient will be evaluated for anticipated admission.   FINAL CLINICAL IMPRESSION(S) / ED DIAGNOSES    Final diagnoses:  Atrial fibrillation with RVR (HCC)     Rx / DC Orders   ED Discharge Orders     None        Note:  This document was prepared using Dragon voice recognition software and may include unintentional dictation errors.   Trinna Post, MD 05/10/23 0000

## 2023-05-09 NOTE — Progress Notes (Signed)
At this time, pt has not arrived at the ED per chart review. Follow up phone call made to patient to check in with him. Pt did not answer. Unable to leave message due to full voicemail. Will follow up again tomorrow.

## 2023-05-10 ENCOUNTER — Observation Stay: Payer: Managed Care, Other (non HMO)

## 2023-05-10 ENCOUNTER — Observation Stay: Payer: Managed Care, Other (non HMO) | Admitting: Radiology

## 2023-05-10 ENCOUNTER — Ambulatory Visit: Payer: Managed Care, Other (non HMO)

## 2023-05-10 DIAGNOSIS — I4891 Unspecified atrial fibrillation: Secondary | ICD-10-CM | POA: Diagnosis not present

## 2023-05-10 HISTORY — PX: IR RADIOLOGIST EVAL & MGMT: IMG5224

## 2023-05-10 LAB — CBC
HCT: 39.8 % (ref 39.0–52.0)
Hemoglobin: 12.4 g/dL — ABNORMAL LOW (ref 13.0–17.0)
MCH: 26.7 pg (ref 26.0–34.0)
MCHC: 31.2 g/dL (ref 30.0–36.0)
MCV: 85.8 fL (ref 80.0–100.0)
Platelets: 193 10*3/uL (ref 150–400)
RBC: 4.64 MIL/uL (ref 4.22–5.81)
RDW: 15.5 % (ref 11.5–15.5)
WBC: 6.5 10*3/uL (ref 4.0–10.5)
nRBC: 0 % (ref 0.0–0.2)

## 2023-05-10 LAB — GLUCOSE, CAPILLARY
Glucose-Capillary: 154 mg/dL — ABNORMAL HIGH (ref 70–99)
Glucose-Capillary: 175 mg/dL — ABNORMAL HIGH (ref 70–99)

## 2023-05-10 LAB — BASIC METABOLIC PANEL
Anion gap: 9 (ref 5–15)
BUN: 17 mg/dL (ref 8–23)
CO2: 27 mmol/L (ref 22–32)
Calcium: 8 mg/dL — ABNORMAL LOW (ref 8.9–10.3)
Chloride: 101 mmol/L (ref 98–111)
Creatinine, Ser: 0.61 mg/dL (ref 0.61–1.24)
GFR, Estimated: >60 mL/min (ref >60)
Glucose, Bld: 131 mg/dL — ABNORMAL HIGH (ref 70–99)
Potassium: 4.1 mmol/L (ref 3.5–5.1)
Sodium: 137 mmol/L (ref 135–145)

## 2023-05-10 LAB — HIV ANTIBODY (ROUTINE TESTING W REFLEX): HIV Screen 4th Generation wRfx: NONREACTIVE

## 2023-05-10 LAB — CBG MONITORING, ED
Glucose-Capillary: 152 mg/dL — ABNORMAL HIGH (ref 70–99)
Glucose-Capillary: 149 mg/dL — ABNORMAL HIGH (ref 70–99)
Glucose-Capillary: 131 mg/dL — ABNORMAL HIGH (ref 70–99)

## 2023-05-10 LAB — HEMOGLOBIN A1C
Hgb A1c MFr Bld: 7.3 % — ABNORMAL HIGH (ref 4.8–5.6)
Mean Plasma Glucose: 162.81 mg/dL

## 2023-05-10 MED ORDER — ORAL CARE MOUTH RINSE: 15.0000 mL | OROMUCOSAL | Status: DC | PRN

## 2023-05-10 MED ORDER — ACETAMINOPHEN 325 MG PO TABS
650.0000 mg | ORAL_TABLET | Freq: Four times a day (QID) | ORAL | Status: DC | PRN
  Administered 2023-05-10 – 2023-05-11 (×2): 650 mg via ORAL
  Filled 2023-05-10 (×2): qty 2

## 2023-05-10 MED ORDER — METOPROLOL SUCCINATE ER 50 MG PO TB24
50.0000 mg | ORAL_TABLET | Freq: Every day | ORAL | Status: DC
  Administered 2023-05-10 – 2023-05-11 (×2): 50 mg via ORAL
  Filled 2023-05-10 (×2): qty 1

## 2023-05-10 NOTE — ED Notes (Signed)
IR tech at bedside to assist with draining pt pleurx. IR drained .

## 2023-05-10 NOTE — ED Notes (Signed)
RN ambulated pt to restroom. Pt ambulated with walker steadily. Pt endorses intermittent dizziness. Pt reports he is not on oxygen at home normally. RN did room air ambulation, pt o2 saturation decreased to 85%. RN assisted pt back to bed and placed pt on [facility]. Pt o2 saturation increased to 96%.

## 2023-05-10 NOTE — Progress Notes (Signed)
PROGRESS NOTE    Charles Hickman  WUJ:[id number] DOB: May 22, 1961 DOA: 05/09/2023 PCP: Dorothey Baseman, MD    Brief Narrative:  62 y.o. 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  Per Charles Hickman, yesterday he developed an odd feeling in his chest and noted that his pulse was elevated.  He was SOB while his pulse was elevated.  He was told to come to the ED and was found to be in Afib with RVR.  In the ED, he received IV diltiazem and a bolus of amiodarone and he converted to sinus rhythm in the 90s.  He has some chronic issues including nausea, low appetite which he associates with his cancer treatment.  He further has a chronic cough, swelling in his legs which has been going on for a few weeks, left before right and related to his XRT treatment.  Overall, he is feeling better since he converted to sinus rhythm.  EDP reported that she spoke with Cardiology, Dr. Duke Salvia, who recommended monitoring overnight on telemetry and then discharge if remains in sinus rhythm   5/21: Patient remained in sinus rhythm.  Ambulated without marked tachycardia.  However he did become hypoxic and notably short of breath.  Desaturated to 86% on room air.  Required initiation of supplemental oxygen.  Chest x-ray demonstrates near opacification of right hemithorax.  Patient has a Pleurx catheter in place that has not been drained since Saturday, 3 days prior to presentation.  Interventional radiology team engaged.  Patient had his Pleurx catheter drained.  550 cc fluid removed.  Remains short of breath with some associated chest wall pain.   Assessment & Plan:   Principal Problem:   Atrial fibrillation with rapid ventricular response (HCC) Active Problems:   Nicotine dependence, cigarettes, w unsp disorders   HLD (hyperlipidemia)   Essential  hypertension   Diabetes (HCC)   CAD (coronary artery disease), native coronary artery   Adenocarcinoma, lung, right (HCC)  Atrial fibrillation with rapid ventricular response Patient converted to sinus rhythm after amiodarone bolus. Plan: Will continue on telemetry overnight.  Continue home metoprolol and Eliquis.  If patient remains in sinus rhythm can likely discharge 5/22.  Otherwise consider inpatient cardiology consult.  Patient known to [facility] cardiology service.  Adenocarcinoma, lung, right with bony metastasis - Currently on treatment with Carboplatin + paclitaxel + bevacizumab every 21 days - POrt in place, appears to be clean and dry - Pleurx in place - Also getting XRT to the left hip/femur Plan: Patient had XRT scheduled today.  I have notified Dr. Orlie Dakin and Dr. Aggie Cosier the patient will remain in the hospital.  Will need to reschedule radiation therapy.  Acute hypoxic respiratory failure Patient desaturated to 86% on room air.  Almost certainly secondary to right side malignant effusion and lung cancer. Plan: Pleurx catheter drain with 500 cc fluid removed.  Will likely need repeat Pleurx drainage done in-house 5/22.  Will engage TOC.  Patient will need an ambulatory desat test to assess for home oxygen need   LE Swelling - He has a reported history of CHF, last EF on TTE was EF of 55-60% and grade 1 diastolic failure, this was in 9562 - Albumin is low and he is receiving XRT - Likely multifactorial - Consider repeat TTE if he remains in house   Essential hypertension -  Continue metoprolol    Diabetes (HCC) - A1C ordered - Continue jardiance - SSI    Nicotine dependence, cigarettes, w unsp disorders - low dose nicotine patch ordered  DVT prophylaxis: Eliquis Code Status: Full Family Communication: Spouse at bedside 5/21 Disposition Plan: Status is: Observation The patient will require care spanning > 2 midnights and should be moved to inpatient because: Acute  hypoxic respiratory failure in the setting of malignant pleural effusion   Level of care: Telemetry Medical  Consultants:  None  Procedures:  Pleurx catheter drainage 5/21  Antimicrobials: None   Subjective: Seen and examined.  Endorses shortness of breath and headache.  Objective: Vitals:   05/10/23 1100 05/10/23 1300 05/10/23 1400 05/10/23 1426  BP: 122/78 103/68 105/78   Pulse: 83 92 89 89  Resp: (!) 29 (!) 22 (!) 27 (!) 26  Temp: 98.2 F (36.8 C)     TempSrc:      SpO2: 95% 95% 96% (!) 89%  Weight:      Height:        Intake/Output Summary (Last 24 hours) at 05/10/2023 1500 Last data filed at 05/09/2023 2127 Gross per 24 hour  Intake 1135.96 ml  Output --  Net 1135.96 ml   Filed Weights   05/09/23 1647 05/10/23 0854  Weight: 74.8 kg 74.8 kg    Examination:  General exam: NAD Respiratory system: Lung sounds diminished on right.  Scattered crackles on the left.  Tachypneic.  2 L Cardiovascular system: S1-2, RRR, no murmurs, no pedal edema Gastrointestinal system: Thin, soft, NT/ND, normal bowel sounds Central nervous system: Alert and oriented. No focal neurological deficits. Extremities: Symmetric 5 x 5 power. Skin: No rashes, lesions or ulcers Psychiatry: Judgement and insight appear normal. Mood & affect appropriate.     Data Reviewed: I have personally reviewed following labs and imaging studies  CBC: Recent Labs  Lab 05/05/23 0852 05/09/23 1649 05/10/23 0434  WBC 9.8 10.6* 6.5  NEUTROABS 7.7  --   --   HGB 13.4 14.6 12.4*  HCT 43.1 46.7 39.8  MCV 86.2 85.1 85.8  PLT 313 255 193   Basic Metabolic Panel: Recent Labs  Lab 05/05/23 0852 05/09/23 1649 05/10/23 0434  NA 136 132* 137  K 4.2 4.7 4.1  CL 99 97* 101  CO2 26 23 27   GLUCOSE 245* 232* 131*  BUN 16 20 17   CREATININE 0.85 0.75 0.61  CALCIUM 9.0 8.5* 8.0*  MG  --  2.3  --    GFR: Estimated Creatinine Clearance: 96.3 mL/min (by C-G formula based on SCr of 0.61  mg/dL). Liver Function Tests: Recent Labs  Lab 05/05/23 0852  AST 16  ALT 12  ALKPHOS 107  BILITOT 0.6  PROT 6.9  ALBUMIN 3.1*   No results for input(s): "LIPASE", "AMYLASE" in the last 168 hours. No results for input(s): "AMMONIA" in the last 168 hours. Coagulation Profile: Recent Labs  Lab 05/09/23 1649  INR 1.3*   Cardiac Enzymes: No results for input(s): "CKTOTAL", "CKMB", "CKMBINDEX", "TROPONINI" in the last 168 hours. BNP (last 3 results) No results for input(s): "PROBNP" in the last 8760 hours. HbA1C: Recent Labs    05/09/23 1644  HGBA1C 7.3*   CBG: Recent Labs  Lab 05/10/23 0754 05/10/23 0956 05/10/23 1212  GLUCAP 152* 149* 131*   Lipid Profile: No results for input(s): "CHOL", "HDL", "LDLCALC", "TRIG", "CHOLHDL", "LDLDIRECT" in the last 72 hours. Thyroid Function Tests: Recent Labs    05/09/23 1649  TSH 0.917  Anemia Panel: No results for input(s): "VITAMINB12", "FOLATE", "FERRITIN", "TIBC", "IRON", "RETICCTPCT" in the last 72 hours. Sepsis Labs: No results for input(s): "PROCALCITON", "LATICACIDVEN" in the last 168 hours.  No results found for this or any previous visit (from the past 240 hour(s)).       Radiology Studies: IR Radiologist Eval & Mgmt  Result Date: 05/10/2023 EXAM: NEW PATIENT OFFICE VISIT CHIEF COMPLAINT: See EMR HISTORY OF PRESENT ILLNESS: PleurX drainage catheter accessed using standard technique. of dark colored fluid removed at bedside. REVIEW OF SYSTEMS: See EMR PHYSICAL EXAMINATION: See EMR ASSESSMENT AND PLAN: See EMR Electronically Signed   By: Olive Bass M.D.   On: 05/10/2023 13:48   US Venous Img Lower Bilateral  Result Date: 05/09/2023 CLINICAL DATA:  New LOWER extremity swelling.  History of cancer. EXAM: BILATERAL LOWER EXTREMITY VENOUS DOPPLER ULTRASOUND TECHNIQUE: Gray-scale sonography with compression, as well as color and duplex ultrasound, were performed to evaluate the deep venous system(s) from  the level of the common femoral vein through the popliteal and proximal calf veins. COMPARISON:  04/28/2023 FINDINGS: VENOUS Normal compressibility of the common femoral, superficial femoral, and popliteal veins, as well as the visualized calf veins. Visualized portions of profunda femoral vein and great saphenous vein unremarkable. No filling defects to suggest DVT on grayscale or color Doppler imaging. Doppler waveforms show normal direction of venous flow, normal respiratory plasticity and response to augmentation. Limited views of the contralateral common femoral vein are unremarkable. OTHER None. Limitations: none IMPRESSION: Negative. Electronically Signed   By: Norva Pavlov M.D.   On: 05/09/2023 18:51   DG Chest 2 View  Result Date: 05/09/2023 CLINICAL DATA:  Chest pain EXAM: CHEST - 2 VIEW COMPARISON:  05/02/2023 FINDINGS: Right chest port and right PleurX catheter are stable in positioning. Persistent complete opacification of the right hemithorax. Volume loss within the right hemithorax. Heart size is obscured. Diffuse interstitial opacities throughout the left lung with nodularity. No left pleural effusion or pneumothorax. IMPRESSION: 1. Persistent complete opacification of the right hemithorax. 2. Diffuse interstitial opacities throughout the left lung with nodularity. No significant interval change. Electronically Signed   By: Duanne Guess D.O.   On: 05/09/2023 17:38        Scheduled Meds:  apixaban  5 mg Oral BID   empagliflozin  25 mg Oral Daily   [START ON 05/11/2023] fentaNYL  1 patch Transdermal Q72H   insulin aspart  0-5 Units Subcutaneous QHS   insulin aspart  0-9 Units Subcutaneous TID WC   metoprolol succinate  50 mg Oral Daily   nicotine  7 mg Transdermal Daily   rosuvastatin  10 mg Oral Daily   Continuous Infusions:  methocarbamol (ROBAXIN) IV       LOS: 0 days     Tresa Moore, MD [facility]   If 7PM-7AM, please contact  night-coverage  05/10/2023, 3:00 PM

## 2023-05-10 NOTE — ED Notes (Signed)
Assumed care from ,RN. Pt resting comfortably in bed at this time. Pt denies any current needs or questions. Call light with in reach. Pt wife at bedside.

## 2023-05-10 NOTE — ED Notes (Signed)
Pt taken off oxygen and o2 saturation dropping to 86-88% while sitting in bed. Pt states he still is feeling SOB, and has a pressure in his chest but that is not abnormal after having his pleurx drained. MD Sreenath notified.

## 2023-05-10 NOTE — ED Notes (Signed)
Pt placed back on 2LNC, xray at bedside.

## 2023-05-11 ENCOUNTER — Ambulatory Visit: Payer: Managed Care, Other (non HMO)

## 2023-05-11 DIAGNOSIS — I4891 Unspecified atrial fibrillation: Secondary | ICD-10-CM

## 2023-05-11 LAB — GLUCOSE, CAPILLARY: Glucose-Capillary: 106 mg/dL — ABNORMAL HIGH (ref 70–99)

## 2023-05-11 MED ORDER — DILTIAZEM HCL 30 MG PO TABS: 30.0000 mg | ORAL_TABLET | Freq: Every day | ORAL | 11 refills | Status: DC | PRN

## 2023-05-11 MED ORDER — METOPROLOL SUCCINATE ER 100 MG PO TB24: 100.0000 mg | ORAL_TABLET | Freq: Every day | ORAL | 1 refills | Status: DC

## 2023-05-11 MED ORDER — METOPROLOL SUCCINATE ER 100 MG PO TB24: 100.0000 mg | ORAL_TABLET | Freq: Every day | ORAL | Status: DC

## 2023-05-11 MED ORDER — METOPROLOL SUCCINATE ER 50 MG PO TB24
50.0000 mg | ORAL_TABLET | Freq: Once | ORAL | Status: AC
  Administered 2023-05-11: 50 mg via ORAL
  Filled 2023-05-11: qty 1

## 2023-05-11 MED ORDER — METOPROLOL SUCCINATE ER 25 MG PO TB24: 25.0000 mg | ORAL_TABLET | Freq: Once | ORAL | Status: DC

## 2023-05-11 NOTE — Plan of Care (Signed)
  Problem: Education: Goal: Knowledge of disease or condition will improve Outcome: Adequate for Discharge Goal: Understanding of medication regimen will improve Outcome: Adequate for Discharge   Problem: Activity: Goal: Ability to tolerate increased activity will improve Outcome: Adequate for Discharge   Problem: Health Behavior/Discharge Planning: Goal: Ability to safely manage health-related needs after discharge will improve Outcome: Adequate for Discharge   Problem: Education: Goal: Knowledge of General Education information will improve Description: Including pain rating scale, medication(s)/side effects and non-pharmacologic comfort measures Outcome: Adequate for Discharge   Problem: Clinical Measurements: Goal: Ability to maintain clinical measurements within normal limits will improve Outcome: Adequate for Discharge Goal: Will remain free from infection Outcome: Adequate for Discharge Goal: Diagnostic test results will improve Outcome: Adequate for Discharge

## 2023-05-11 NOTE — Discharge Summary (Signed)
Charles Hickman ZOX:[id number] DOB: 1961/04/15 DOA: 05/09/2023  PCP: Dorothey Baseman, MD  Admit date: 05/09/2023 Discharge date: 05/11/2023  Time spent: 35 minutes  Recommendations for Outpatient Follow-up:  Close cardiology and oncology f/u     Discharge Diagnoses:  Principal Problem:   Atrial fibrillation with rapid ventricular response (HCC) Active Problems:   Nicotine dependence, cigarettes, w unsp disorders   HLD (hyperlipidemia)   Essential hypertension   Diabetes (HCC)   CAD (coronary artery disease), native coronary artery   Adenocarcinoma, lung, right (HCC)   Diffuse large B cell lymphoma (HCC)   Discharge Condition: stable  Diet recommendation: heart health  Filed Weights   05/09/23 1647 05/10/23 0854  Weight: 74.8 kg 74.8 kg    History of present illness:  From admission h and p Charles Hickman is a 62 y.o. 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  Per Mr. Roebke, yesterday he developed an odd feeling in his chest and noted that his pulse was elevated.  He was SOB while his pulse was elevated.  He was told to come to the ED and was found to be in Afib with RVR.  In the ED, he received IV diltiazem and a bolus of amiodarone and he converted to sinus rhythm in the 90s.  He has some chronic issues including nausea, low appetite which he associates with his cancer treatment.  He further has a chronic cough, swelling in his legs which has been going on for a few weeks, left before right and related to his XRT treatment.  Overall, he is feeling better since he converted to sinus rhythm.  EDP reported that she spoke with Cardiology, Dr. Duke Salvia, who recommended monitoring overnight on telemetry and then discharge if remains in sinus rhythm   Hospital Course:   Atrial fibrillation with rapid ventricular  response Patient converted to sinus rhythm after amiodarone bolus. New lung cancer likely provoking. Compliant with apixaban so pe unlikely. Ambulated successfully - increase metop to 100 qd, add prn dilt (which was previously ordered by cardiology but patient says he doesn't have) - close outpt cardiology f/u   Adenocarcinoma, lung, right with bony metastasis - Currently on treatment with Carboplatin + paclitaxel + bevacizumab every 21 days - POrt in place, appears to be clean and dry - Pleurx in place, IR drained 500 cc while here - Also getting XRT to the left hip/femur Plan: - close outpt f/u   Acute hypoxic respiratory failure Patient desaturated to 86% on room air.  Almost certainly secondary to right side malignant effusion and lung cancer. Qualified for o2 as follows, will d/c home with ambulatory o2  SATURATION QUALIFICATIONS: (This note is used to comply with regulatory documentation for home oxygen)   Patient Saturations on Room Air at Rest = 92%   Patient Saturations on Room Air while Ambulating = 87%   Patient Saturations on 2 Liters of oxygen while Ambulating = 94%       Procedures: Pleurx catheter drainage with IR   Consultations: IR for above procedure  Discharge Exam: Vitals:   05/11/23 0423 05/11/23 0800  BP: 104/75 131/81  Pulse: 90 94  Resp: 18 16  Temp: 97.9 F (36.6 C) 98.2 F (36.8 C)  SpO2: 97% 94%    General: NAD Cardiovascular: RRR Respiratory: rales and decreased breath sounds on  right  Discharge Instructions   Discharge Instructions     Diet - low sodium heart healthy   Complete by: As directed    Diet - low sodium heart healthy   Complete by: As directed    Increase activity slowly   Complete by: As directed    Increase activity slowly   Complete by: As directed       Allergies as of 05/11/2023   No Known Allergies      Medication List     TAKE these medications    blood glucose meter kit and supplies Kit Dispense  based on patient and insurance preference. Use 2 times daily as directed. (FOR ICD-10 E11.9).   diltiazem 30 MG tablet Commonly known as: Cardizem Take 1 tablet (30 mg total) by mouth daily as needed (for fast heart rate).   Eliquis 5 MG Tabs tablet Generic drug: apixaban TAKE 1 TABLET BY MOUTH TWICE A DAY   fentaNYL 25 MCG/HR Commonly known as: DURAGESIC Place 1 patch onto the skin every 3 (three) days.   Jardiance 25 MG Tabs tablet Generic drug: empagliflozin TAKE 1 TABLET BY MOUTH EVERY DAY   lidocaine-prilocaine cream Commonly known as: EMLA Apply to affected area once   metoprolol succinate 100 MG 24 hr tablet Commonly known as: TOPROL-XL Take 1 tablet (100 mg total) by mouth daily. TAKE WITH OR IMMEDIATELY FOLLOWING A MEAL. What changed:  medication strength how much to take   naloxone 4 MG/0.1ML Liqd nasal spray kit Commonly known as: NARCAN See admin instructions.   nitroGLYCERIN 0.4 MG SL tablet Commonly known as: NITROSTAT Place 1 tablet (0.4 mg total) under the tongue every 5 (five) minutes as needed for chest pain.   ondansetron 8 MG tablet Commonly known as: Zofran Take 1 tablet (8 mg total) by mouth every 8 (eight) hours as needed for nausea or vomiting. Start on the third day after carboplatin.   OneTouch Verio test strip Generic drug: glucose blood USE TWICE A DAY AS DIRECTED   oxyCODONE 5 MG immediate release tablet Commonly known as: Oxy IR/ROXICODONE Take 1 tablet (5 mg total) by mouth every 6 (six) hours as needed for severe pain.   prochlorperazine 10 MG tablet Commonly known as: COMPAZINE Take 1 tablet (10 mg total) by mouth every 6 (six) hours as needed for nausea or vomiting.   rosuvastatin 10 MG tablet Commonly known as: CRESTOR Take 1 tablet (10 mg total) by mouth daily.               Durable Medical Equipment  (From admission, onward)           Start     Ordered   05/11/23 1025  DME Oxygen  Once       Question Answer  Comment  Length of Need Lifetime   Liters per Minute 2   Frequency Continuous (stationary and portable oxygen unit needed)   Oxygen delivery system Gas      05/11/23 1024           No Known Allergies  Follow-up Information     Antonieta Iba, MD Follow up.   Specialty: Cardiology Contact information: [address] [place] [address] [phone]         Jeralyn Ruths, MD Follow up.   Specialty: Oncology Contact information: [address] [place] [address] [phone]         Dorothey Baseman, MD Follow up.   Specialty: Family Medicine  Contact information: [address] [place] [address] [phone]                  The results of significant diagnostics from this hospitalization (including imaging, microbiology, ancillary and laboratory) are listed below for reference.    Significant Diagnostic Studies: DG Chest Port 1 View  Result Date: 05/10/2023 CLINICAL DATA:  Provided history: Hypoxia. EXAM: PORTABLE CHEST 1 VIEW COMPARISON:  Prior chest radiographs 05/09/2023 and earlier. Chest CT 03/31/2023. FINDINGS: Right chest infusion port catheter and right PleurX catheter, unchanged in position from the prior examination of 05/09/2023. There is persistent complete opacification of the right hemithorax. Extensive interstitial and nodular opacities throughout the left lung, also unchanged. No appreciable left pleural effusion. No evidence of pneumothorax. IMPRESSION: 1. No significant change from yesterday's chest radiograph. 2. Right chest infusion port catheter and right PleurX catheter. 3. Persistent complete opacification of the right hemithorax. 4. Extensive interstitial and nodular opacities throughout the left lung. Electronically Signed   By: Jackey Loge D.O.   On: 05/10/2023 15:02   IR Radiologist Eval & Mgmt  Result Date: 05/10/2023 EXAM: NEW PATIENT OFFICE VISIT CHIEF COMPLAINT: See EMR HISTORY OF PRESENT  ILLNESS: PleurX drainage catheter accessed using standard technique. of dark colored fluid removed at bedside. REVIEW OF SYSTEMS: See EMR PHYSICAL EXAMINATION: See EMR ASSESSMENT AND PLAN: See EMR Electronically Signed   By: Olive Bass M.D.   On: 05/10/2023 13:48   US Venous Img Lower Bilateral  Result Date: 05/09/2023 CLINICAL DATA:  New LOWER extremity swelling.  History of cancer. EXAM: BILATERAL LOWER EXTREMITY VENOUS DOPPLER ULTRASOUND TECHNIQUE: Gray-scale sonography with compression, as well as color and duplex ultrasound, were performed to evaluate the deep venous system(s) from the level of the common femoral vein through the popliteal and proximal calf veins. COMPARISON:  04/28/2023 FINDINGS: VENOUS Normal compressibility of the common femoral, superficial femoral, and popliteal veins, as well as the visualized calf veins. Visualized portions of profunda femoral vein and great saphenous vein unremarkable. No filling defects to suggest DVT on grayscale or color Doppler imaging. Doppler waveforms show normal direction of venous flow, normal respiratory plasticity and response to augmentation. Limited views of the contralateral common femoral vein are unremarkable. OTHER None. Limitations: none IMPRESSION: Negative. Electronically Signed   By: Norva Pavlov M.D.   On: 05/09/2023 18:51   DG Chest 2 View  Result Date: 05/09/2023 CLINICAL DATA:  Chest pain EXAM: CHEST - 2 VIEW COMPARISON:  05/02/2023 FINDINGS: Right chest port and right PleurX catheter are stable in positioning. Persistent complete opacification of the right hemithorax. Volume loss within the right hemithorax. Heart size is obscured. Diffuse interstitial opacities throughout the left lung with nodularity. No left pleural effusion or pneumothorax. IMPRESSION: 1. Persistent complete opacification of the right hemithorax. 2. Diffuse interstitial opacities throughout the left lung with nodularity. No significant interval change.  Electronically Signed   By: Duanne Guess D.O.   On: 05/09/2023 17:38   DG Chest Port 1 View  Result Date: 05/02/2023 CLINICAL DATA:  Lung cancer, shortness of breath, hypoxia, status post pleural catheter drain placement EXAM: PORTABLE CHEST 1 VIEW COMPARISON:  04/20/2023 FINDINGS: Interval right chest PleurX catheter insertion. Despite 1.6 L right thoracentesis following the procedure there remains complete opacification of the right hemithorax. No pneumothorax. Right IJ port catheter tip SVC RA junction level. Diffuse left lung nodular interstitial opacities compatible with known pulmonary metastatic disease. No developing left pleural effusion. Stable heart size. No acute  osseous finding. IMPRESSION: 1. Interval right chest PleurX catheter insertion. 2. Persistent complete opacification of the right hemithorax. 3. No pneumothorax. Electronically Signed   By: Judie Petit.  Shick M.D.   On: 05/02/2023 12:18   IR US Guide Bx Asp/Drain  Result Date: 05/02/2023 INDICATION: 62 year old male with advanced stage right lung cancer and left supraclavicular lymphadenopathy presenting for core biopsy for molecular testing. EXAM: Ultrasound-guided left supraclavicular lymph node biopsy MEDICATIONS: None. ANESTHESIA/SEDATION: Moderate (conscious) sedation was employed during this procedure. A total of Versed 2 mg and Fentanyl 100 mcg was administered intravenously. Moderate Sedation Time: 36 minutes. The patient's level of consciousness and vital signs were monitored continuously by radiology nursing throughout the procedure under my direct supervision. FLUOROSCOPY TIME:  None. COMPLICATIONS: None immediate. PROCEDURE: Informed written consent was obtained from the patient after a thorough discussion of the procedural risks, benefits and alternatives. All questions were addressed. Maximal Sterile Barrier Technique was utilized including caps, mask, sterile gowns, sterile gloves, sterile drape, hand hygiene and skin  antiseptic. A timeout was performed prior to the initiation of the procedure. Preprocedure ultrasound evaluation demonstrated an ill-defined, heterogeneously hypoechoic prominent lymph node in the left supraclavicular region just lateral to the left internal jugular vein. The procedure was planned. The left neck was prepped and draped in standard fashion. Subdermal Local anesthesia was provided at the planned needle entry site with 1% lidocaine. A small skin nick was made. Under direct ultrasound visualization, an 17 gauge coaxial introducer needle was advanced into the periphery of the lymph node. Next, a total of 318 gauge cores were obtained with an 18 gauge supercore advantage biopsy device. The samples were placed in formalin. The introducer needle was removed. Postprocedure ultrasound evaluation demonstrated no evidence of surrounding hematoma or other complicating features. Hemostasis was achieved at the needle entry site with brief manual compression. A sterile bandage was applied. The patient tolerated the procedure well was transferred to the recovery area in good condition. IMPRESSION: Technically successful ultrasound-guided left supraclavicular lymph node biopsy. Marliss Coots, MD [facility] Rad[facility] Electronically Signed   By: Marliss Coots M.D.   On: 05/02/2023 10:06   IR Guided Drain W Catheter Placement  Result Date: 05/02/2023 CLINICAL DATA:  62 year old male with advanced stage right lung cancer and recurrent right malignant pleural effusion. EXAM: INSERTION OF TUNNELED right SIDED PLEURAL DRAINAGE CATHETER COMPARISON:  None Available. MEDICATIONS: Ancef 2 gm IV; Antibiotic was administered in an appropriate time interval for the procedure. ANESTHESIA/SEDATION: Moderate (conscious) sedation was employed during this procedure. A total of Versed 2 mg and Fentanyl 100 mcg was administered intravenously. Moderate Sedation Time: 36 minutes. The  patient's level of consciousness and vital signs were monitored continuously by radiology nursing throughout the procedure under my direct supervision. FLUOROSCOPY TIME:  FLUOROSCOPY TIME Two mGy COMPLICATIONS: None immediate. PROCEDURE: The procedure, risks, benefits, and alternatives were explained to the patient, who wishes to proceed with the placement of this permanent pleural catheter as they are seeking palliative care. The patient understands and consents to the procedure. The right lateral chest and upper abdomen were prepped and draped in a sterile fashion, and a sterile drape was applied covering the operative field. A sterile gown and sterile gloves were used for the procedure. Initial ultrasound scanning and fluoroscopic imaging demonstrates a recurrent moderate to large pleural effusion. Under direct ultrasound guidance, the inferior lateral pleural space was accessed with a Yueh sheath needle after the overlying soft tissues were anesthetized with 1% lidocaine  with epinephrine. An Amplatz super stiff wire was then advanced under fluoroscopy into the pleural space. A 15.5 French tunneled Pleur-X catheter was tunneled from an incision within the right upper abdominal quadrant to the access site. The pleural access site was serially dilated under fluoroscopy, ultimately allowing placement of a peel-away sheath. The catheter was advanced through the peel-away sheath. The sheath was then removed. Final catheter positioning was confirmed with a fluoroscopic radiographic image. The access incision was closed with 3 0 Vicryl suture and Dermabond. A 0 silk retention suture was applied at the catheter exit site. Large volume thoracentesis was performed through the new catheter utilizing provided bulb vacuum assisted drainage bag. Dressings were applied. The patient tolerated the above procedure well without immediate postprocedural complication. FINDINGS: Preprocedural ultrasound scanning demonstrates a  recurrent large sized right sided pleural effusion. After ultrasound and fluoroscopic guided placement, the catheter is directed medially and superiorly. Following catheter placement, approximately 1,600 cc of translucent, port colored pleural fluid was removed. IMPRESSION: Successful placement of permanent, tunneled right pleural drainage catheter via lateral approach. Approximately 1.6 liters of pleural fluid was removed following catheter placement. Marliss Coots, MD [facility] Radiology Electronically Signed   By: Marliss Coots M.D.   On: 05/02/2023 10:03   IR IMAGING GUIDED PORT INSERTION  Result Date: 05/02/2023 INDICATION: 62 year old male with history of advanced stage right lung cancer presenting for Port-A-Cath placement. EXAM: IMPLANTED PORT A CATH PLACEMENT WITH ULTRASOUND AND FLUOROSCOPIC GUIDANCE COMPARISON:  None Available. MEDICATIONS: None. ANESTHESIA/SEDATION: Moderate (conscious) sedation was employed during this procedure. A total of Versed 2 mg and Fentanyl 100 mcg was administered intravenously. Moderate Sedation Time: 36 minutes. The patient's level of consciousness and vital signs were monitored continuously by radiology nursing throughout the procedure under my direct supervision. CONTRAST:  None FLUOROSCOPY TIME:  Two mGy COMPLICATIONS: None immediate. PROCEDURE: The procedure, risks, benefits, and alternatives were explained to the patient. Questions regarding the procedure were encouraged and answered. The patient understands and consents to the procedure. The right neck and chest were prepped with chlorhexidine in a sterile fashion, and a sterile drape was applied covering the operative field. Maximum barrier sterile technique with sterile gowns and gloves were used for the procedure. A timeout was performed prior to the initiation of the procedure. Ultrasound was used to examine the jugular vein which was compressible and free of  internal echoes. A skin marker was used to demarcate the planned venotomy and port pocket incision sites. Local anesthesia was provided to these sites and the subcutaneous tunnel track with 1% lidocaine with 1:100,000 epinephrine. A small incision was created at the jugular access site and blunt dissection was performed of the subcutaneous tissues. Under ultrasound guidance, the jugular vein was accessed with a 21 ga micropuncture needle and an 0.018" wire was inserted to the superior vena cava. Real-time ultrasound guidance was utilized for vascular access including the acquisition of a permanent ultrasound image documenting patency of the accessed vessel. A 5 Fr micopuncture set was then used, through which a 0.035" Rosen wire was passed under fluoroscopic guidance into the inferior vena cava. An 8 Fr dilator was then placed over the wire. A subcutaneous port pocket was then created along the upper chest wall utilizing a combination of sharp and blunt dissection. The pocket was irrigated with sterile saline, packed with gauze, and observed for hemorrhage. A single lumen standard sized power injectable port was chosen for placement. The 8 Fr catheter was tunneled from  the port pocket site to the venotomy incision. The port was placed in the pocket. The external catheter was trimmed to appropriate length. The dilator was exchanged for an 8 Fr peel-away sheath under fluoroscopic guidance. The catheter was then placed through the sheath and the sheath was removed. Final catheter positioning was confirmed and documented with a fluoroscopic spot radiograph. The port was accessed with a Huber needle, aspirated, and flushed with heparinized saline. The deep dermal layer of the port pocket incision was closed with interrupted 3-0 Vicryl suture. Dermabond was then placed over the port pocket and neck incisions. The patient tolerated the procedure well without immediate post procedural complication. FINDINGS: After catheter  placement, the tip lies within the superior cavoatrial junction. The catheter aspirates and flushes normally and is ready for immediate use. IMPRESSION: Successful placement of a power injectable Port-A-Cath via the right internal jugular vein. The catheter is ready for immediate use. Marliss Coots, MD [facility] Radiology Electronically Signed   By: Marliss Coots M.D.   On: 05/02/2023 10:00   US Venous Img Lower Unilateral Left  Result Date: 04/28/2023 CLINICAL DATA:  Leg swelling EXAM: Left LOWER EXTREMITY VENOUS DOPPLER ULTRASOUND TECHNIQUE: Gray-scale sonography with compression, as well as color and duplex ultrasound, were performed to evaluate the deep venous system(s) from the level of the common femoral vein through the popliteal and proximal calf veins. COMPARISON:  None Available. FINDINGS: VENOUS Normal compressibility of the common femoral, superficial femoral, and popliteal veins, as well as the visualized calf veins. Visualized portions of profunda femoral vein and great saphenous vein unremarkable. No filling defects to suggest DVT on grayscale or color Doppler imaging. Doppler waveforms show normal direction of venous flow, normal respiratory plasticity and response to augmentation. Limited views of the contralateral common femoral vein are unremarkable. OTHER There is slow flow seen in the left popliteal vein. Limitations: none IMPRESSION: No evidence of left lower extremity DVT Electronically Signed   By: Karen Kays M.D.   On: 04/28/2023 15:58   MR Brain W Wo Contrast  Result Date: 04/22/2023 CLINICAL DATA:  Initial staging for lung nodules. Headaches. History of lymphoma. EXAM: MRI HEAD WITHOUT AND WITH CONTRAST TECHNIQUE: Multiplanar, multiecho pulse sequences of the brain and surrounding structures were obtained without and with intravenous contrast. CONTRAST:  8.5 mL Vueway COMPARISON:  Head MRI 06/01/2011.  PET-CT 04/19/2023. FINDINGS:  Brain: There is no evidence of an acute infarct, intracranial hemorrhage, midline shift, or extra-axial fluid collection. Scattered small T2 hyperintensities in the cerebral white matter have mildly progressed from 2012 and are nonspecific but compatible with mild chronic small vessel ischemic disease. A chronic lacunar infarct is noted in the right corona radiata. The ventricles and sulci are within normal limits for age. A cavum septum pellucidum et vergae is incidentally noted. There is a 3 mm round enhancing lesion in the left cerebellar hemisphere without edema (series 119, image 31). Vascular: Major intracranial vascular flow voids are preserved. Skull and upper cervical spine: As seen on PET-CT, there is an approximately 5 cm destructive mass centered in the left occipital bone at the skull base. There is associated dural thickening and enhancement overlying the inferior and lateral aspects of the left cerebellar hemisphere. Tumor involves the left hypoglossal canal and jugular foramen. There is a separate 1 cm lesion in the clivus. Sinuses/Orbits: Unremarkable orbits. Moderate mucosal thickening and minimal fluid in the left maxillary sinus. Moderate left mastoid fluid. Other: None. IMPRESSION: 1. 3 mm  enhancing lesion in the left cerebellar hemisphere suspicious for a metastasis. 2. 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 3. Additional small lesion in the clivus. Electronically Signed   By: Sebastian Ache M.D.   On: 04/22/2023 08:05   NM PET Image Initial (PI) Skull Base To Thigh  Result Date: 04/20/2023 CLINICAL DATA:  Initial treatment strategy for pulmonary nodules. Prior history of lymphoma. EXAM: NUCLEAR MEDICINE PET SKULL BASE TO THIGH TECHNIQUE: 9.22 mCi F-18 FDG was injected intravenously. Full-ring PET imaging was performed from the skull base to thigh after the radiotracer. CT data was obtained and used for attenuation correction and anatomic localization. Fasting blood glucose:  113 mg/dl COMPARISON:  Chest CT 16/09/9603 FINDINGS: Mediastinal blood pool activity: SUV max 2.43 Liver activity: SUV max 2.70 NECK: No neck mass or cervical lymphadenopathy. Incidental CT findings: None. CHEST: Small lymph node noted in the left supraclavicular fossa with SUV max of 2.3. There are also small supraclavicular and subclavicular lymph nodes on the left side which are hypermetabolic. SUV max is 2.86 The right lung is completely obstructed/drowned with a abrupt cut off of the right mainstem bronchus. There are numerous areas of hypermetabolism throughout the obstructed right lung. Right upper lobe hypermetabolism near the bronchial cut has an SUV max of 6.59. Findings consistent with a probable primary lung neoplasm and diffuse pulmonary metastatic disease. There is also a large right pleural effusion and multiple hypermetabolic pleural lesions. The recent thoracentesis may yield a pathologic diagnosis. There is also mediastinal lymphadenopathy which is hypermetabolic. The largest upper right mediastinal disease measures a maximum of 23 mm and the SUV max is 5.70. Diffuse pulmonary nodules throughout the aerated left lung. The larger nodules demonstrate hypermetabolism in her consistent with metastatic disease. Incidental CT findings: The heart is normal in size. No pericardial effusion. There are age advanced aortic and three-vessel coronary artery calcifications. ABDOMEN/PELVIS: No findings to suggest hepatic metastatic disease. There is a 2.2 cm left adrenal gland lesion demonstrating mild hypermetabolism with SUV max of 3.76 which is likely a metastatic focus. No enlarged or hypermetabolic mesenteric or retroperitoneal nodes to suggest recurrent lymphoma or metastatic adenopathy. No pelvic adenopathy. Incidental CT findings: Significant age advanced atherosclerotic calcification involving the aorta and branch vessels but no aneurysm. SKELETON: Diffuse osseous metastatic disease. There is a large  destructive lesion involving the left occipital bone at the skull base with SUV max of 6.53. Left-sided sternal lesion has an SUV max of 5.87. Left fifth anterior rib has an SUV max of 6.32. Multiple vertebral body lesions without spinal canal compromise. T9 lesion has an SUV max of 4.89. L1 lesion has an SUV max of 6.56. Large destructive central sacral lesion has an SUV max of 5.50. Large destructive left hip lesion has an SUV max of 4.75. Findings highly worrisome for impending fracture. Right hip lesion at the level of the lesser trochanter has an SUV max of 5.22. Incidental CT findings: None. IMPRESSION: 1. Obstructed/drowned right lung with numerous areas of hypermetabolism likely due to a primary lung neoplasm. 2. Large right pleural effusion with multiple hypermetabolic pleural lesions. The recent thoracentesis may yield a pathologic diagnosis. If not, biopsy of a left supraclavicular node may be the easiest and safest tissue diagnosis. 3. Diffuse pulmonary metastatic disease. 4. Hypermetabolic supraclavicular and mediastinal lymphadenopathy. 5. Left adrenal gland lesion is likely a metastatic lesion. 6. Diffuse osseous metastatic disease. The left hip lesion is highly worrisome for impending fracture. Large destructive lesion at the left skull  base. Vertebral body lesions but no spinal canal compromise. 7. Age advanced atherosclerotic calcification involving the aorta and three-vessel coronary artery calcifications. Aortic Atherosclerosis (ICD10-I70.0). Electronically Signed   By: Rudie Meyer M.D.   On: 04/20/2023 16:38   US THORACENTESIS ASP PLEURAL SPACE W/IMG GUIDE  Result Date: 04/20/2023 INDICATION: History of lymphoma found to have right-sided recurrent pleural effusion. Request received for diagnostic and therapeutic right thoracentesis. EXAM: ULTRASOUND GUIDED DIAGNOSTIC AND THERAPEUTIC RIGHT THORACENTESIS MEDICATIONS: 10 mL 1 % lidocaine COMPLICATIONS: None immediate. PROCEDURE: An ultrasound  guided thoracentesis was thoroughly discussed with the patient and questions answered. The benefits, risks, alternatives and complications were also discussed. The patient understands and wishes to proceed with the procedure. Written consent was obtained. Ultrasound was performed to localize and mark an adequate pocket of fluid in the right chest. The area was then prepped and draped in the normal sterile fashion. 1% Lidocaine was used for local anesthesia. Under ultrasound guidance a 6 Fr Safe-T-Centesis catheter was introduced. Thoracentesis was performed. The catheter was removed and a dressing applied. FINDINGS: A total of approximately 1.2 L of hazy, amber fluid was removed. Samples were sent to the laboratory as requested by the clinical team. IMPRESSION: Successful ultrasound guided right thoracentesis yielding 1.2 L of pleural fluid. Read by: Alex Gardener, AGNP-BC Electronically Signed   By: Olive Bass M.D.   On: 04/20/2023 16:16   DG Chest Port 1 View  Result Date: 04/20/2023 CLINICAL DATA:  Right pleural effusion.  Status post thoracentesis. EXAM: PORTABLE CHEST 1 VIEW COMPARISON:  Chest x-ray 04/11/2023 and recent PET-CT 04/19/2023. FINDINGS: Persistent complete opacification of the right hemithorax with a bronchial cut off sign suggesting diffuse drowned/obstructed right lung. I do not see any obvious hypermetabolic central lung mass on the PET-CT. Recommend bronchoscopic evaluation. The left lung demonstrates diffuse interstitial and airspace process and innumerable pulmonary nodules. IMPRESSION: Persistent complete opacification of the right hemithorax despite the thoracentesis with a bronchial cut off sign suggesting diffuse drowned/obstructed right lung. Recommend bronchoscopic evaluation. Electronically Signed   By: Rudie Meyer M.D.   On: 04/20/2023 16:13   US THORACENTESIS ASP PLEURAL SPACE W/IMG GUIDE  Result Date: 04/11/2023 INDICATION: Patient with history of lymphoma. Found to have  right-sided pleural effusion with diffuse left lobe lung nodules consistent with underlying malignancy yet to be determined. Patient presents for therapeutic and diagnostic thoracentesis EXAM: ULTRASOUND GUIDED THERAPEUTIC AND DIAGNOSTIC THORACENTESIS MEDICATIONS: Lidocaine 1% 10 mL COMPLICATIONS: None immediate. PROCEDURE: An ultrasound guided thoracentesis was thoroughly discussed with the patient and questions answered. The benefits, risks, alternatives and complications were also discussed. The patient understands and wishes to proceed with the procedure. Written consent was obtained. Ultrasound was performed to localize and mark an adequate pocket of fluid in the right chest. The area was then prepped and draped in the normal sterile fashion. 1% Lidocaine was used for local anesthesia. Under ultrasound guidance a 6 Fr Safe-T-Centesis catheter was introduced. Thoracentesis was performed. The catheter was removed and a dressing applied. FINDINGS: A total of approximately 1 L of amber color fluid was removed. Samples were sent to the laboratory as requested by the clinical team. IMPRESSION: Successful ultrasound guided right-sided therapeutic and diagnostic thoracentesis yielding 1 L of pleural fluid. Read by: Anders Grant, NP Electronically Signed   By: Malachy Moan M.D.   On: 04/11/2023 16:10   DG Chest Port 1 View  Result Date: 04/11/2023 CLINICAL DATA:  Right pleural effusion status post thoracentesis. 1 L of fluid drained from  right pleural space earlier today. EXAM: PORTABLE CHEST 1 VIEW COMPARISON:  AP chest 04/06/2023, chest two views 11/15/2017; CT chest 03/31/2023 FINDINGS: There is again complete homogeneous opacification of the right hemithorax. Mild rightward mediastinal shift is similar to 04/06/2023 and 03/31/2023 CT, likely due to the compressive atelectasis associated with a large right pleural effusion. Diffuse left lung moderate interstitial thickening and reticulonodular  opacities are similar to prior. No definite left pleural effusion, noting no pleural effusion was seen on 04/06/2023 radiographs but a small pleural effusion was seen on 03/31/2023 CT. No pneumothorax. Visualized cardiac silhouette and mediastinal contours are unremarkable. Mild-to-moderate atherosclerotic calcification within the aortic arch. No acute skeletal abnormality. IMPRESSION: 1. Complete homogeneous opacification of the right hemithorax, similar to 04/06/2023 and 03/31/2023 CT, likely due to the compressive atelectasis associated with persistent large right pleural effusion. Note is made of right-sided thoracentesis earlier today, and the persistent right hemithorax homogeneous opacification is similar to that seen on 04/06/2023 radiograph following thoracentesis earlier that day as well. 2. Diffuse left lung moderate interstitial thickening and reticulonodular opacities, similar to prior. Appearance on prior CT was suggestive of possible neoplastic etiology including lymphoma given patient history of lymphoma. Electronically Signed   By: Neita Garnet M.D.   On: 04/11/2023 15:45    Microbiology: No results found for this or any previous visit (from the past 240 hour(s)).   Labs: Basic Metabolic Panel: Recent Labs  Lab 05/05/23 0852 05/09/23 1649 05/10/23 0434  NA 136 132* 137  K 4.2 4.7 4.1  CL 99 97* 101  CO2 26 23 27   GLUCOSE 245* 232* 131*  BUN 16 20 17   CREATININE 0.85 0.75 0.61  CALCIUM 9.0 8.5* 8.0*  MG  --  2.3  --    Liver Function Tests: Recent Labs  Lab 05/05/23 0852  AST 16  ALT 12  ALKPHOS 107  BILITOT 0.6  PROT 6.9  ALBUMIN 3.1*   No results for input(s): "LIPASE", "AMYLASE" in the last 168 hours. No results for input(s): "AMMONIA" in the last 168 hours. CBC: Recent Labs  Lab 05/05/23 0852 05/09/23 1649 05/10/23 0434  WBC 9.8 10.6* 6.5  NEUTROABS 7.7  --   --   HGB 13.4 14.6 12.4*  HCT 43.1 46.7 39.8  MCV 86.2 85.1 85.8  PLT 313 255 193    Cardiac Enzymes: No results for input(s): "CKTOTAL", "CKMB", "CKMBINDEX", "TROPONINI" in the last 168 hours. BNP: BNP (last 3 results) No results for input(s): "BNP" in the last 8760 hours.  ProBNP (last 3 results) No results for input(s): "PROBNP" in the last 8760 hours.  CBG: Recent Labs  Lab 05/10/23 0956 05/10/23 1212 05/10/23 1746 05/10/23 2106 05/11/23 0802  GLUCAP 149* 131* 154* 175* 106*       Signed:  Silvano Bilis MD.  [facility] 05/11/2023, 10:26 AM

## 2023-05-11 NOTE — Progress Notes (Signed)
SATURATION QUALIFICATIONS: (This note is used to comply with regulatory documentation for home oxygen)  Patient Saturations on Room Air at Rest = 92%  Patient Saturations on Room Air while Ambulating = 87%  Patient Saturations on 2 Liters of oxygen while Ambulating = 94%  Please briefly explain why patient needs home oxygen: 

## 2023-05-11 NOTE — TOC Transition Note (Addendum)
Transition of Care [facility]) - Progression Note    Patient Details  Name: Charles Hickman MRN: [id number] Date of Birth: 04/22/1961  [facility] CM/SW Contact  Truddie Hidden, RN Phone Number: 05/11/2023, 10:32 AM  Clinical Narrative:    Patient will require home oxygen. Request sent to [facility] from [place].  Spoke with patient regarding discharge home today.  He was advised oxygen would be delivered to his room  His wife will transport him home.   TOC signing off.          Expected Discharge Plan and Services         Expected Discharge Date: 05/11/23                                     Social Determinants of Health (SDOH) Interventions SDOH Screenings   Food Insecurity: No Food Insecurity (05/10/2023)  Housing: Low Risk  (05/10/2023)  Transportation Needs: No Transportation Needs (05/10/2023)  Utilities: Not At Risk (05/10/2023)  Depression (PHQ2-9): Low Risk  (12/29/2020)  Financial Resource Strain: Low Risk  (04/06/2023)  Tobacco Use: High Risk (05/10/2023)    Readmission Risk Interventions     No data to display

## 2023-05-12 ENCOUNTER — Other Ambulatory Visit: Payer: Self-pay

## 2023-05-12 ENCOUNTER — Ambulatory Visit
Admission: RE | Admit: 2023-05-12 | Discharge: 2023-05-12 | Disposition: A | Discharge status: Home / Self Care | Payer: Managed Care, Other (non HMO) | Source: Ambulatory Visit | Attending: Radiation Oncology | Admitting: Radiation Oncology

## 2023-05-12 ENCOUNTER — Other Ambulatory Visit: Payer: Self-pay | Admitting: *Deleted

## 2023-05-12 ENCOUNTER — Encounter: Payer: Self-pay | Admitting: *Deleted

## 2023-05-12 DIAGNOSIS — Z510 Encounter for antineoplastic radiation therapy: Secondary | ICD-10-CM | POA: Diagnosis not present

## 2023-05-12 DIAGNOSIS — C3491 Malignant neoplasm of unspecified part of right bronchus or lung: Secondary | ICD-10-CM

## 2023-05-12 LAB — RAD ONC ARIA SESSION SUMMARY
Course Elapsed Days: 8
Plan Fractions Treated to Date: 4
Plan Prescribed Dose Per Fraction: 3 Gy
Plan Total Fractions Prescribed: 10
Plan Total Prescribed Dose: 30 Gy
Reference Point Dosage Given to Date: 12 Gy
Reference Point Session Dosage Given: 3 Gy
Session Number: 4

## 2023-05-12 MED ORDER — NYSTATIN 100000 UNIT/ML MT SUSP: 5.0000 mL | Freq: Four times a day (QID) | OROMUCOSAL | 0 refills | Status: DC

## 2023-05-12 NOTE — Progress Notes (Signed)
Pt seen after radiation today. States feeling a little better after getting rest at home following hospital discharge. Pt requested that I help with pleurx drainage today. Pt and wife have been draining at home since discharge but requested additional help today during clinic visit. Dressing had minimal serosanguinous drainage visible when removed. Pleurx site was clean, dry, and intact. Pt tolerated pleurx drainage well. Complaints of chest tightness when draining was finished which pt states is normal for him and resolves after a few hours. Successful drainage of dark colored fluid removed. New dressing applied to pleurx site. Pt stated that his mouth does feel sore and that it feels like it is "raw." Dr. Orlie Dakin made aware who recommended medicated mouthwash with nystatin to try first. Rx sent to pharmacy and pt made aware. Will follow up with pt tomorrow during follow up visit. Nothing further needed at this time.

## 2023-05-13 ENCOUNTER — Encounter: Payer: Self-pay | Admitting: Oncology

## 2023-05-13 ENCOUNTER — Inpatient Hospital Stay (HOSPITAL_BASED_OUTPATIENT_CLINIC_OR_DEPARTMENT_OTHER): Payer: Managed Care, Other (non HMO) | Admitting: Oncology

## 2023-05-13 ENCOUNTER — Inpatient Hospital Stay: Payer: Managed Care, Other (non HMO)

## 2023-05-13 ENCOUNTER — Ambulatory Visit
Admission: RE | Admit: 2023-05-13 | Discharge: 2023-05-13 | Disposition: A | Discharge status: Home / Self Care | Payer: Managed Care, Other (non HMO) | Source: Ambulatory Visit | Attending: Radiation Oncology | Admitting: Radiation Oncology

## 2023-05-13 ENCOUNTER — Ambulatory Visit: Payer: Managed Care, Other (non HMO)

## 2023-05-13 ENCOUNTER — Other Ambulatory Visit: Payer: Self-pay

## 2023-05-13 ENCOUNTER — Encounter: Payer: Self-pay | Admitting: *Deleted

## 2023-05-13 VITALS — BP 95/74 | HR 96 | Temp 97.6°F | Wt 167.0 lb

## 2023-05-13 DIAGNOSIS — C3491 Malignant neoplasm of unspecified part of right bronchus or lung: Secondary | ICD-10-CM

## 2023-05-13 DIAGNOSIS — Z510 Encounter for antineoplastic radiation therapy: Secondary | ICD-10-CM | POA: Diagnosis not present

## 2023-05-13 DIAGNOSIS — Z95828 Presence of other vascular implants and grafts: Secondary | ICD-10-CM

## 2023-05-13 LAB — CMP (CANCER CENTER ONLY)
ALT: 12 U/L (ref 0–44)
AST: 20 U/L (ref 15–41)
Albumin: 2.6 g/dL — ABNORMAL LOW (ref 3.5–5.0)
Alkaline Phosphatase: 144 U/L — ABNORMAL HIGH (ref 38–126)
Anion gap: 12 (ref 5–15)
BUN: 12 mg/dL (ref 8–23)
CO2: 26 mmol/L (ref 22–32)
Calcium: 8.2 mg/dL — ABNORMAL LOW (ref 8.9–10.3)
Chloride: 96 mmol/L — ABNORMAL LOW (ref 98–111)
Creatinine: 0.57 mg/dL — ABNORMAL LOW (ref 0.61–1.24)
GFR, Estimated: >60 mL/min (ref >60)
Glucose, Bld: 184 mg/dL — ABNORMAL HIGH (ref 70–99)
Potassium: 3.7 mmol/L (ref 3.5–5.1)
Sodium: 134 mmol/L — ABNORMAL LOW (ref 135–145)
Total Bilirubin: 0.4 mg/dL (ref 0.3–1.2)
Total Protein: 5.8 g/dL — ABNORMAL LOW (ref 6.5–8.1)

## 2023-05-13 LAB — CBC WITH DIFFERENTIAL/PLATELET
Abs Immature Granulocytes: 0.03 10*3/uL (ref 0.00–0.07)
Basophils Absolute: 0 10*3/uL (ref 0.0–0.1)
Basophils Relative: 1 %
Eosinophils Absolute: 0.1 10*3/uL (ref 0.0–0.5)
Eosinophils Relative: 2 %
HCT: 38.4 % — ABNORMAL LOW (ref 39.0–52.0)
Hemoglobin: 12.6 g/dL — ABNORMAL LOW (ref 13.0–17.0)
Immature Granulocytes: 1 %
Lymphocytes Relative: 17 %
Lymphs Abs: 0.5 10*3/uL — ABNORMAL LOW (ref 0.7–4.0)
MCH: 27.7 pg (ref 26.0–34.0)
MCHC: 32.8 g/dL (ref 30.0–36.0)
MCV: 84.4 fL (ref 80.0–100.0)
Monocytes Absolute: 0.2 10*3/uL (ref 0.1–1.0)
Monocytes Relative: 8 %
Neutro Abs: 2 10*3/uL (ref 1.7–7.7)
Neutrophils Relative %: 71 %
Platelets: 190 10*3/uL (ref 150–400)
RBC: 4.55 MIL/uL (ref 4.22–5.81)
RDW: 15.5 % (ref 11.5–15.5)
WBC: 2.8 10*3/uL — ABNORMAL LOW (ref 4.0–10.5)
nRBC: 0 % (ref 0.0–0.2)

## 2023-05-13 LAB — RAD ONC ARIA SESSION SUMMARY
Course Elapsed Days: 9
Plan Fractions Treated to Date: 5
Plan Prescribed Dose Per Fraction: 3 Gy
Plan Total Fractions Prescribed: 10
Plan Total Prescribed Dose: 30 Gy
Reference Point Dosage Given to Date: 15 Gy
Reference Point Session Dosage Given: 3 Gy
Session Number: 5

## 2023-05-13 MED ORDER — HEPARIN SOD (PORK) LOCK FLUSH 100 UNIT/ML IV SOLN
500.0000 [IU] | Freq: Once | INTRAVENOUS | Status: AC | PRN
  Administered 2023-05-13: 500 [IU]
  Filled 2023-05-13: qty 5

## 2023-05-13 MED ORDER — DEXAMETHASONE 4 MG PO TABS
4.0000 mg | ORAL_TABLET | Freq: Every day | ORAL | 1 refills | Status: DC
Start: 2023-05-13 — End: 2023-06-03

## 2023-05-13 MED ORDER — HEPARIN SOD (PORK) LOCK FLUSH 100 UNIT/ML IV SOLN
500.0000 [IU] | Freq: Once | INTRAVENOUS | Status: AC
  Administered 2023-05-13: 500 [IU] via INTRAVENOUS
  Filled 2023-05-13: qty 5

## 2023-05-13 MED ORDER — SODIUM CHLORIDE 0.9% FLUSH
10.0000 mL | Freq: Once | INTRAVENOUS | Status: AC
  Administered 2023-05-13: 10 mL via INTRAVENOUS
  Filled 2023-05-13: qty 10

## 2023-05-13 MED ORDER — SODIUM CHLORIDE 0.9 % IV SOLN
Freq: Once | INTRAVENOUS | Status: AC
  Filled 2023-05-13: qty 250

## 2023-05-13 NOTE — Progress Notes (Signed)
[facility]  Telephone:([phone]) [phone] Fax:(336) [phone]  ID: [name] OB: 01-15-1961  MR#: [id number]  ION#:[id number]  Patient Care Team: Dorothey Baseman, MD as PCP - General (Family Medicine) Mariah Milling Tollie Pizza, MD as PCP - Cardiology (Cardiology) Glory Buff, RN as Oncology Nurse Navigator  CHIEF COMPLAINT: Stage IV adenocarcinoma of the right lung.  INTERVAL HISTORY: Patient returns to clinic today for further evaluation and to assess his toleration of cycle 1 of carboplatinum, Taxol, and Avastin.  He was admitted to the hospital for several days with A-fib/flutter, but now his heart rate is better controlled.  He noted some increased nausea and a poor appetite.  He continues to have chronic weakness and fatigue.  He has pain at his left skull base as well as his left hip, but this is well-controlled with his current narcotic regimen. He has no other neurologic complaints.  He denies any recent fevers.  He denies any chest pain, cough, or hemoptysis.  He has chronic shortness of breath he denies any nausea, vomiting, constipation, or diarrhea.  He has no urinary complaints.  Patient offers no further specific complaints today.  REVIEW OF SYSTEMS:   Review of Systems  Constitutional:  Positive for weight loss. Negative for fever and malaise/fatigue.  Respiratory:  Positive for shortness of breath. Negative for cough and hemoptysis.   Cardiovascular:  Positive for leg swelling. Negative for chest pain.  Gastrointestinal:  Positive for nausea. Negative for abdominal pain.  Genitourinary: Negative.  Negative for dysuria and hematuria.  Musculoskeletal:  Positive for joint pain. Negative for back pain.  Skin: Negative.  Negative for rash.  Neurological:  Positive for weakness and headaches. Negative for dizziness and seizures.  Psychiatric/Behavioral: Negative.  The patient is not nervous/anxious.     As per HPI. Otherwise, a complete review of systems is  negative.  PAST MEDICAL HISTORY: Past Medical History:  Diagnosis Date   Abnormal EKG 11/15/2017   Abnormal stress test    Atrial fibrillation (HCC)    CAD in native artery    a. 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 No EKG changes concerning for ischemia at peak stress or in recovery. Mod to high risk scan; b. LHC 12/15/17: pLAD 75% w/ FFR 0.74 s/p PCI/DES, mRCA 90%, CTO dRCA w/ L-R colatts, EF 45-50%, inf HK    Diabetes mellitus (HCC)    HCV (hepatitis C virus)    HLD (hyperlipidemia)    Hypertension    Large cell lymphoma (HCC)    a. s/p 6 cycles of R-CHOP   Peripheral neuropathy    Stroke [facility])    Systolic dysfunction    a. TTE 8/16: EF > 55%, aortic sclerosis, normal RV systolic function; b. LHC 12/15/17: EF 45-50%, inf HK    PAST SURGICAL HISTORY: Past Surgical History:  Procedure Laterality Date   APPENDECTOMY     CORONARY PRESSURE/FFR STUDY N/A 12/15/2017   Procedure: INTRAVASCULAR PRESSURE WIRE/FFR STUDY;  Surgeon: Iran Ouch, MD;  Location: [facility] INVASIVE CV LAB;  Service: Cardiovascular;  Laterality: N/A;   IR GUIDED DRAIN W CATHETER PLACEMENT  05/02/2023   IR IMAGING GUIDED PORT INSERTION  05/02/2023   IR RADIOLOGIST EVAL & MGMT  05/10/2023   IR US GUIDE BX ASP/DRAIN  05/02/2023   LEFT HEART CATH AND CORONARY ANGIOGRAPHY N/A 12/15/2017  Procedure: LEFT HEART CATH AND CORONARY ANGIOGRAPHY;  Surgeon: Iran Ouch, MD;  Location: [facility] INVASIVE CV LAB;  Service: Cardiovascular;  Laterality: N/A;   TONSILLECTOMY AND ADENOIDECTOMY      FAMILY HISTORY: Family History  Problem Relation Age of Onset   Hypertension Mother    Hyperlipidemia Mother    Stroke Mother    Diabetes Mother    Kidney disease Father    Hypertension Father    Diabetes Father    Dementia Maternal Grandmother     ADVANCED DIRECTIVES (Y/N):   N  HEALTH MAINTENANCE: Social History   Tobacco Use   Smoking status: Every Day    Packs/day: .5    Types: Cigarettes   Smokeless tobacco: Never   Tobacco comments:    Down to a couple of cigarettes a day  Substance Use Topics   Alcohol use: Yes    Comment: occasional   Drug use: No     Colonoscopy:  PAP:  Bone density:  Lipid panel:  No Known Allergies  Current Outpatient Medications  Medication Sig Dispense Refill   apixaban (ELIQUIS) 5 MG TABS tablet TAKE 1 TABLET BY MOUTH TWICE A DAY 180 tablet 1   diltiazem (CARDIZEM) 30 MG tablet Take 1 tablet (30 mg total) by mouth daily as needed (for fast heart rate). 30 tablet 11   fentaNYL (DURAGESIC) 25 MCG/HR Place 1 patch onto the skin every 3 (three) days. 10 patch 0   JARDIANCE 25 MG TABS tablet TAKE 1 TABLET BY MOUTH EVERY DAY 90 tablet 2   lidocaine-prilocaine (EMLA) cream Apply to affected area once 30 g 3   metoprolol succinate (TOPROL-XL) 100 MG 24 hr tablet Take 1 tablet (100 mg total) by mouth daily. TAKE WITH OR IMMEDIATELY FOLLOWING A MEAL. 90 tablet 1   nystatin (MYCOSTATIN) 100000 UNIT/ML suspension Take 5 mLs (500,000 Units total) by mouth 4 (four) times daily. Swish and spit. 473 mL 0   ondansetron (ZOFRAN) 8 MG tablet Take 1 tablet (8 mg total) by mouth every 8 (eight) hours as needed for nausea or vomiting. Start on the third day after carboplatin. 60 tablet 2   oxyCODONE (OXY IR/ROXICODONE) 5 MG immediate release tablet Take 1 tablet (5 mg total) by mouth every 6 (six) hours as needed for severe pain. 60 tablet 0   rosuvastatin (CRESTOR) 10 MG tablet Take 1 tablet (10 mg total) by mouth daily. 90 tablet 3   blood glucose meter kit and supplies KIT Dispense based on patient and insurance preference. Use 2 times daily as directed. (FOR ICD-10 E11.9). (Patient not taking: Reported on 05/09/2023) 1 each 0   naloxone (NARCAN) nasal spray 4 mg/0.1 mL See admin instructions. (Patient not taking: Reported on 05/06/2023)      nitroGLYCERIN (NITROSTAT) 0.4 MG SL tablet Place 1 tablet (0.4 mg total) under the tongue every 5 (five) minutes as needed for chest pain. (Patient not taking: Reported on 05/06/2023) 25 tablet 3   ONETOUCH VERIO test strip USE TWICE A DAY AS DIRECTED (Patient not taking: Reported on 05/09/2023) 100 strip 0   prochlorperazine (COMPAZINE) 10 MG tablet Take 1 tablet (10 mg total) by mouth every 6 (six) hours as needed for nausea or vomiting. (Patient not taking: Reported on 05/06/2023) 60 tablet 2   No current facility-administered medications for this visit.    OBJECTIVE: Vitals:   05/13/23 0942  BP: 95/74  Pulse: 96  Temp: 97.6 F (36.4 C)  SpO2: 97%     Body  mass index is 23.96 kg/m.    ECOG FS:1 - Symptomatic but completely ambulatory  General: Well-developed, well-nourished, no acute distress. Eyes: Pink conjunctiva, anicteric sclera. HEENT: Normocephalic, moist mucous membranes. Lungs: No audible wheezing or coughing.  Pleurx catheter noted. Heart: Regular rate and rhythm. Abdomen: Soft, nontender, no obvious distention. Musculoskeletal: No edema, cyanosis, or clubbing. Neuro: Alert, answering all questions appropriately. Cranial nerves grossly intact. Skin: No rashes or petechiae noted. Psych: Normal affect.   LAB RESULTS:  Lab Results  Component Value Date   NA 137 05/10/2023   K 4.1 05/10/2023   CL 101 05/10/2023   CO2 27 05/10/2023   GLUCOSE 131 (H) 05/10/2023   BUN 17 05/10/2023   CREATININE 0.61 05/10/2023   CALCIUM 8.0 (L) 05/10/2023   PROT 6.9 05/05/2023   ALBUMIN 3.1 (L) 05/05/2023   AST 16 05/05/2023   ALT 12 05/05/2023   ALKPHOS 107 05/05/2023   BILITOT 0.6 05/05/2023   GFRNONAA >60 05/10/2023   GFRAA 87 12/11/2020    Lab Results  Component Value Date   WBC 6.5 05/10/2023   NEUTROABS 7.7 05/05/2023   HGB 12.4 (L) 05/10/2023   HCT 39.8 05/10/2023   MCV 85.8 05/10/2023   PLT 193 05/10/2023     STUDIES: DG Chest Port 1 View  Result Date:  05/10/2023 CLINICAL DATA:  Provided history: Hypoxia. EXAM: PORTABLE CHEST 1 VIEW COMPARISON:  Prior chest radiographs 05/09/2023 and earlier. Chest CT 03/31/2023. FINDINGS: Right chest infusion port catheter and right PleurX catheter, unchanged in position from the prior examination of 05/09/2023. There is persistent complete opacification of the right hemithorax. Extensive interstitial and nodular opacities throughout the left lung, also unchanged. No appreciable left pleural effusion. No evidence of pneumothorax. IMPRESSION: 1. No significant change from yesterday's chest radiograph. 2. Right chest infusion port catheter and right PleurX catheter. 3. Persistent complete opacification of the right hemithorax. 4. Extensive interstitial and nodular opacities throughout the left lung. Electronically Signed   By: Jackey Loge D.O.   On: 05/10/2023 15:02   IR Radiologist Eval & Mgmt  Result Date: 05/10/2023 EXAM: NEW PATIENT OFFICE VISIT CHIEF COMPLAINT: See EMR HISTORY OF PRESENT ILLNESS: PleurX drainage catheter accessed using standard technique. of dark colored fluid removed at bedside. REVIEW OF SYSTEMS: See EMR PHYSICAL EXAMINATION: See EMR ASSESSMENT AND PLAN: See EMR Electronically Signed   By: Olive Bass M.D.   On: 05/10/2023 13:48   US Venous Img Lower Bilateral  Result Date: 05/09/2023 CLINICAL DATA:  New LOWER extremity swelling.  History of cancer. EXAM: BILATERAL LOWER EXTREMITY VENOUS DOPPLER ULTRASOUND TECHNIQUE: Gray-scale sonography with compression, as well as color and duplex ultrasound, were performed to evaluate the deep venous system(s) from the level of the common femoral vein through the popliteal and proximal calf veins. COMPARISON:  04/28/2023 FINDINGS: VENOUS Normal compressibility of the common femoral, superficial femoral, and popliteal veins, as well as the visualized calf veins. Visualized portions of profunda femoral vein and great saphenous vein unremarkable. No  filling defects to suggest DVT on grayscale or color Doppler imaging. Doppler waveforms show normal direction of venous flow, normal respiratory plasticity and response to augmentation. Limited views of the contralateral common femoral vein are unremarkable. OTHER None. Limitations: none IMPRESSION: Negative. Electronically Signed   By: Norva Pavlov M.D.   On: 05/09/2023 18:51   DG Chest 2 View  Result Date: 05/09/2023 CLINICAL DATA:  Chest pain EXAM: CHEST - 2 VIEW COMPARISON:  05/02/2023 FINDINGS: Right chest port and right  PleurX catheter are stable in positioning. Persistent complete opacification of the right hemithorax. Volume loss within the right hemithorax. Heart size is obscured. Diffuse interstitial opacities throughout the left lung with nodularity. No left pleural effusion or pneumothorax. IMPRESSION: 1. Persistent complete opacification of the right hemithorax. 2. Diffuse interstitial opacities throughout the left lung with nodularity. No significant interval change. Electronically Signed   By: Duanne Guess D.O.   On: 05/09/2023 17:38   DG Chest Port 1 View  Result Date: 05/02/2023 CLINICAL DATA:  Lung cancer, shortness of breath, hypoxia, status post pleural catheter drain placement EXAM: PORTABLE CHEST 1 VIEW COMPARISON:  04/20/2023 FINDINGS: Interval right chest PleurX catheter insertion. Despite 1.6 L right thoracentesis following the procedure there remains complete opacification of the right hemithorax. No pneumothorax. Right IJ port catheter tip SVC RA junction level. Diffuse left lung nodular interstitial opacities compatible with known pulmonary metastatic disease. No developing left pleural effusion. Stable heart size. No acute osseous finding. IMPRESSION: 1. Interval right chest PleurX catheter insertion. 2. Persistent complete opacification of the right hemithorax. 3. No pneumothorax. Electronically Signed   By: Judie Petit.  Shick M.D.   On: 05/02/2023 12:18   IR US Guide Bx  Asp/Drain  Result Date: 05/02/2023 INDICATION: 62 year old male with advanced stage right lung cancer and left supraclavicular lymphadenopathy presenting for core biopsy for molecular testing. EXAM: Ultrasound-guided left supraclavicular lymph node biopsy MEDICATIONS: None. ANESTHESIA/SEDATION: Moderate (conscious) sedation was employed during this procedure. A total of Versed 2 mg and Fentanyl 100 mcg was administered intravenously. Moderate Sedation Time: 36 minutes. The patient's level of consciousness and vital signs were monitored continuously by radiology nursing throughout the procedure under my direct supervision. FLUOROSCOPY TIME:  None. COMPLICATIONS: None immediate. PROCEDURE: Informed written consent was obtained from the patient after a thorough discussion of the procedural risks, benefits and alternatives. All questions were addressed. Maximal Sterile Barrier Technique was utilized including caps, mask, sterile gowns, sterile gloves, sterile drape, hand hygiene and skin antiseptic. A timeout was performed prior to the initiation of the procedure. Preprocedure ultrasound evaluation demonstrated an ill-defined, heterogeneously hypoechoic prominent lymph node in the left supraclavicular region just lateral to the left internal jugular vein. The procedure was planned. The left neck was prepped and draped in standard fashion. Subdermal Local anesthesia was provided at the planned needle entry site with 1% lidocaine. A small skin nick was made. Under direct ultrasound visualization, an 17 gauge coaxial introducer needle was advanced into the periphery of the lymph node. Next, a total of 318 gauge cores were obtained with an 18 gauge supercore advantage biopsy device. The samples were placed in formalin. The introducer needle was removed. Postprocedure ultrasound evaluation demonstrated no evidence of surrounding hematoma or other complicating features. Hemostasis was achieved at the needle entry site with  brief manual compression. A sterile bandage was applied. The patient tolerated the procedure well was transferred to the recovery area in good condition. IMPRESSION: Technically successful ultrasound-guided left supraclavicular lymph node biopsy. Marliss Coots, MD [facility] Radiology Electronically Signed   By: Marliss Coots M.D.   On: 05/02/2023 10:06   IR Guided Drain W Catheter Placement  Result Date: 05/02/2023 CLINICAL DATA:  62 year old male with advanced stage right lung cancer and recurrent right malignant pleural effusion. EXAM: INSERTION OF TUNNELED right SIDED PLEURAL DRAINAGE CATHETER COMPARISON:  None Available. MEDICATIONS: Ancef 2 gm IV; Antibiotic was administered in an appropriate time interval for the procedure. ANESTHESIA/SEDATION: Moderate (conscious) sedation was employed during  this procedure. A total of Versed 2 mg and Fentanyl 100 mcg was administered intravenously. Moderate Sedation Time: 36 minutes. The patient's level of consciousness and vital signs were monitored continuously by radiology nursing throughout the procedure under my direct supervision. FLUOROSCOPY TIME:  FLUOROSCOPY TIME Two mGy COMPLICATIONS: None immediate. PROCEDURE: The procedure, risks, benefits, and alternatives were explained to the patient, who wishes to proceed with the placement of this permanent pleural catheter as they are seeking palliative care. The patient understands and consents to the procedure. The right lateral chest and upper abdomen were prepped and draped in a sterile fashion, and a sterile drape was applied covering the operative field. A sterile gown and sterile gloves were used for the procedure. Initial ultrasound scanning and fluoroscopic imaging demonstrates a recurrent moderate to large pleural effusion. Under direct ultrasound guidance, the inferior lateral pleural space was accessed with a Yueh sheath needle after the overlying soft  tissues were anesthetized with 1% lidocaine with epinephrine. An Amplatz super stiff wire was then advanced under fluoroscopy into the pleural space. A 15.5 French tunneled Pleur-X catheter was tunneled from an incision within the right upper abdominal quadrant to the access site. The pleural access site was serially dilated under fluoroscopy, ultimately allowing placement of a peel-away sheath. The catheter was advanced through the peel-away sheath. The sheath was then removed. Final catheter positioning was confirmed with a fluoroscopic radiographic image. The access incision was closed with 3 0 Vicryl suture and Dermabond. A 0 silk retention suture was applied at the catheter exit site. Large volume thoracentesis was performed through the new catheter utilizing provided bulb vacuum assisted drainage bag. Dressings were applied. The patient tolerated the above procedure well without immediate postprocedural complication. FINDINGS: Preprocedural ultrasound scanning demonstrates a recurrent large sized right sided pleural effusion. After ultrasound and fluoroscopic guided placement, the catheter is directed medially and superiorly. Following catheter placement, approximately 1,600 cc of translucent, port colored pleural fluid was removed. IMPRESSION: Successful placement of permanent, tunneled right pleural drainage catheter via lateral approach. Approximately 1.6 liters of pleural fluid was removed following catheter placement. Marliss Coots, MD [facility] Radiology Electronically Signed   By: Marliss Coots M.D.   On: 05/02/2023 10:03   IR IMAGING GUIDED PORT INSERTION  Result Date: 05/02/2023 INDICATION: 62 year old male with history of advanced stage right lung cancer presenting for Port-A-Cath placement. EXAM: IMPLANTED PORT A CATH PLACEMENT WITH ULTRASOUND AND FLUOROSCOPIC GUIDANCE COMPARISON:  None Available. MEDICATIONS: None. ANESTHESIA/SEDATION: Moderate  (conscious) sedation was employed during this procedure. A total of Versed 2 mg and Fentanyl 100 mcg was administered intravenously. Moderate Sedation Time: 36 minutes. The patient's level of consciousness and vital signs were monitored continuously by radiology nursing throughout the procedure under my direct supervision. CONTRAST:  None FLUOROSCOPY TIME:  Two mGy COMPLICATIONS: None immediate. PROCEDURE: The procedure, risks, benefits, and alternatives were explained to the patient. Questions regarding the procedure were encouraged and answered. The patient understands and consents to the procedure. The right neck and chest were prepped with chlorhexidine in a sterile fashion, and a sterile drape was applied covering the operative field. Maximum barrier sterile technique with sterile gowns and gloves were used for the procedure. A timeout was performed prior to the initiation of the procedure. Ultrasound was used to examine the jugular vein which was compressible and free of internal echoes. A skin marker was used to demarcate the planned venotomy and port pocket incision sites. Local anesthesia was provided to these  sites and the subcutaneous tunnel track with 1% lidocaine with 1:100,000 epinephrine. A small incision was created at the jugular access site and blunt dissection was performed of the subcutaneous tissues. Under ultrasound guidance, the jugular vein was accessed with a 21 ga micropuncture needle and an 0.018" wire was inserted to the superior vena cava. Real-time ultrasound guidance was utilized for vascular access including the acquisition of a permanent ultrasound image documenting patency of the accessed vessel. A 5 Fr micopuncture set was then used, through which a 0.035" Rosen wire was passed under fluoroscopic guidance into the inferior vena cava. An 8 Fr dilator was then placed over the wire. A subcutaneous port pocket was then created along the upper chest wall utilizing a combination of sharp  and blunt dissection. The pocket was irrigated with sterile saline, packed with gauze, and observed for hemorrhage. A single lumen standard sized power injectable port was chosen for placement. The 8 Fr catheter was tunneled from the port pocket site to the venotomy incision. The port was placed in the pocket. The external catheter was trimmed to appropriate length. The dilator was exchanged for an 8 Fr peel-away sheath under fluoroscopic guidance. The catheter was then placed through the sheath and the sheath was removed. Final catheter positioning was confirmed and documented with a fluoroscopic spot radiograph. The port was accessed with a Huber needle, aspirated, and flushed with heparinized saline. The deep dermal layer of the port pocket incision was closed with interrupted 3-0 Vicryl suture. Dermabond was then placed over the port pocket and neck incisions. The patient tolerated the procedure well without immediate post procedural complication. FINDINGS: After catheter placement, the tip lies within the superior cavoatrial junction. The catheter aspirates and flushes normally and is ready for immediate use. IMPRESSION: Successful placement of a power injectable Port-A-Cath via the right internal jugular vein. The catheter is ready for immediate use. Marliss Coots, MD [facility] Radiology Electronically Signed   By: Marliss Coots M.D.   On: 05/02/2023 10:00   US Venous Img Lower Unilateral Left  Result Date: 04/28/2023 CLINICAL DATA:  Leg swelling EXAM: Left LOWER EXTREMITY VENOUS DOPPLER ULTRASOUND TECHNIQUE: Gray-scale sonography with compression, as well as color and duplex ultrasound, were performed to evaluate the deep venous system(s) from the level of the common femoral vein through the popliteal and proximal calf veins. COMPARISON:  None Available. FINDINGS: VENOUS Normal compressibility of the common femoral, superficial femoral, and popliteal veins,  as well as the visualized calf veins. Visualized portions of profunda femoral vein and great saphenous vein unremarkable. No filling defects to suggest DVT on grayscale or color Doppler imaging. Doppler waveforms show normal direction of venous flow, normal respiratory plasticity and response to augmentation. Limited views of the contralateral common femoral vein are unremarkable. OTHER There is slow flow seen in the left popliteal vein. Limitations: none IMPRESSION: No evidence of left lower extremity DVT Electronically Signed   By: Karen Kays M.D.   On: 04/28/2023 15:58   MR Brain W Wo Contrast  Result Date: 04/22/2023 CLINICAL DATA:  Initial staging for lung nodules. Headaches. History of lymphoma. EXAM: MRI HEAD WITHOUT AND WITH CONTRAST TECHNIQUE: Multiplanar, multiecho pulse sequences of the brain and surrounding structures were obtained without and with intravenous contrast. CONTRAST:  8.5 mL Vueway COMPARISON:  Head MRI 06/01/2011.  PET-CT 04/19/2023. FINDINGS: Brain: There is no evidence of an acute infarct, intracranial hemorrhage, midline shift, or extra-axial fluid collection. Scattered small T2 hyperintensities in the  cerebral white matter have mildly progressed from 2012 and are nonspecific but compatible with mild chronic small vessel ischemic disease. A chronic lacunar infarct is noted in the right corona radiata. The ventricles and sulci are within normal limits for age. A cavum septum pellucidum et vergae is incidentally noted. There is a 3 mm round enhancing lesion in the left cerebellar hemisphere without edema (series 119, image 31). Vascular: Major intracranial vascular flow voids are preserved. Skull and upper cervical spine: As seen on PET-CT, there is an approximately 5 cm destructive mass centered in the left occipital bone at the skull base. There is associated dural thickening and enhancement overlying the inferior and lateral aspects of the left cerebellar hemisphere. Tumor involves  the left hypoglossal canal and jugular foramen. There is a separate 1 cm lesion in the clivus. Sinuses/Orbits: Unremarkable orbits. Moderate mucosal thickening and minimal fluid in the left maxillary sinus. Moderate left mastoid fluid. Other: None. IMPRESSION: 1. 3 mm enhancing lesion in the left cerebellar hemisphere suspicious for a metastasis. 2. 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 3. Additional small lesion in the clivus. Electronically Signed   By: Sebastian Ache M.D.   On: 04/22/2023 08:05   NM PET Image Initial (PI) Skull Base To Thigh  Result Date: 04/20/2023 CLINICAL DATA:  Initial treatment strategy for pulmonary nodules. Prior history of lymphoma. EXAM: NUCLEAR MEDICINE PET SKULL BASE TO THIGH TECHNIQUE: 9.22 mCi F-18 FDG was injected intravenously. Full-ring PET imaging was performed from the skull base to thigh after the radiotracer. CT data was obtained and used for attenuation correction and anatomic localization. Fasting blood glucose: 113 mg/dl COMPARISON:  Chest CT 02/72/5366 FINDINGS: Mediastinal blood pool activity: SUV max 2.43 Liver activity: SUV max 2.70 NECK: No neck mass or cervical lymphadenopathy. Incidental CT findings: None. CHEST: Small lymph node noted in the left supraclavicular fossa with SUV max of 2.3. There are also small supraclavicular and subclavicular lymph nodes on the left side which are hypermetabolic. SUV max is 2.86 The right lung is completely obstructed/drowned with a abrupt cut off of the right mainstem bronchus. There are numerous areas of hypermetabolism throughout the obstructed right lung. Right upper lobe hypermetabolism near the bronchial cut has an SUV max of 6.59. Findings consistent with a probable primary lung neoplasm and diffuse pulmonary metastatic disease. There is also a large right pleural effusion and multiple hypermetabolic pleural lesions. The recent thoracentesis may yield a pathologic diagnosis. There is also mediastinal  lymphadenopathy which is hypermetabolic. The largest upper right mediastinal disease measures a maximum of 23 mm and the SUV max is 5.70. Diffuse pulmonary nodules throughout the aerated left lung. The larger nodules demonstrate hypermetabolism in her consistent with metastatic disease. Incidental CT findings: The heart is normal in size. No pericardial effusion. There are age advanced aortic and three-vessel coronary artery calcifications. ABDOMEN/PELVIS: No findings to suggest hepatic metastatic disease. There is a 2.2 cm left adrenal gland lesion demonstrating mild hypermetabolism with SUV max of 3.76 which is likely a metastatic focus. No enlarged or hypermetabolic mesenteric or retroperitoneal nodes to suggest recurrent lymphoma or metastatic adenopathy. No pelvic adenopathy. Incidental CT findings: Significant age advanced atherosclerotic calcification involving the aorta and branch vessels but no aneurysm. SKELETON: Diffuse osseous metastatic disease. There is a large destructive lesion involving the left occipital bone at the skull base with SUV max of 6.53. Left-sided sternal lesion has an SUV max of 5.87. Left fifth anterior rib has an SUV max of 6.32. Multiple  vertebral body lesions without spinal canal compromise. T9 lesion has an SUV max of 4.89. L1 lesion has an SUV max of 6.56. Large destructive central sacral lesion has an SUV max of 5.50. Large destructive left hip lesion has an SUV max of 4.75. Findings highly worrisome for impending fracture. Right hip lesion at the level of the lesser trochanter has an SUV max of 5.22. Incidental CT findings: None. IMPRESSION: 1. Obstructed/drowned right lung with numerous areas of hypermetabolism likely due to a primary lung neoplasm. 2. Large right pleural effusion with multiple hypermetabolic pleural lesions. The recent thoracentesis may yield a pathologic diagnosis. If not, biopsy of a left supraclavicular node may be the easiest and safest tissue diagnosis.  3. Diffuse pulmonary metastatic disease. 4. Hypermetabolic supraclavicular and mediastinal lymphadenopathy. 5. Left adrenal gland lesion is likely a metastatic lesion. 6. Diffuse osseous metastatic disease. The left hip lesion is highly worrisome for impending fracture. Large destructive lesion at the left skull base. Vertebral body lesions but no spinal canal compromise. 7. Age advanced atherosclerotic calcification involving the aorta and three-vessel coronary artery calcifications. Aortic Atherosclerosis (ICD10-I70.0). Electronically Signed   By: Rudie Meyer M.D.   On: 04/20/2023 16:38   US THORACENTESIS ASP PLEURAL SPACE W/IMG GUIDE  Result Date: 04/20/2023 INDICATION: History of lymphoma found to have right-sided recurrent pleural effusion. Request received for diagnostic and therapeutic right thoracentesis. EXAM: ULTRASOUND GUIDED DIAGNOSTIC AND THERAPEUTIC RIGHT THORACENTESIS MEDICATIONS: 10 mL 1 % lidocaine COMPLICATIONS: None immediate. PROCEDURE: An ultrasound guided thoracentesis was thoroughly discussed with the patient and questions answered. The benefits, risks, alternatives and complications were also discussed. The patient understands and wishes to proceed with the procedure. Written consent was obtained. Ultrasound was performed to localize and mark an adequate pocket of fluid in the right chest. The area was then prepped and draped in the normal sterile fashion. 1% Lidocaine was used for local anesthesia. Under ultrasound guidance a 6 Fr Safe-T-Centesis catheter was introduced. Thoracentesis was performed. The catheter was removed and a dressing applied. FINDINGS: A total of approximately 1.2 L of hazy, amber fluid was removed. Samples were sent to the laboratory as requested by the clinical team. IMPRESSION: Successful ultrasound guided right thoracentesis yielding 1.2 L of pleural fluid. Read by: Alex Gardener, AGNP-BC Electronically Signed   By: Olive Bass M.D.   On: 04/20/2023 16:16    DG Chest Port 1 View  Result Date: 04/20/2023 CLINICAL DATA:  Right pleural effusion.  Status post thoracentesis. EXAM: PORTABLE CHEST 1 VIEW COMPARISON:  Chest x-ray 04/11/2023 and recent PET-CT 04/19/2023. FINDINGS: Persistent complete opacification of the right hemithorax with a bronchial cut off sign suggesting diffuse drowned/obstructed right lung. I do not see any obvious hypermetabolic central lung mass on the PET-CT. Recommend bronchoscopic evaluation. The left lung demonstrates diffuse interstitial and airspace process and innumerable pulmonary nodules. IMPRESSION: Persistent complete opacification of the right hemithorax despite the thoracentesis with a bronchial cut off sign suggesting diffuse drowned/obstructed right lung. Recommend bronchoscopic evaluation. Electronically Signed   By: Rudie Meyer M.D.   On: 04/20/2023 16:13    ASSESSMENT: Stage IV adenocarcinoma of the right lung.  PLAN:    Stage IV adenocarcinoma of the right lung: PET scan results from April 19, 2023 reviewed independently with widespread metastatic disease throughout lungs and bones.  MRI of the brain on April 18, 2023 revealed a 3 mm focus.  Molecular/PD-L1 testing pending at time of dictation.  Patient will benefit from carboplatinum, Taxol, and Avastin every 21 days  for 6 cycles with Udenyca support.  He tolerated cycle 1 last week without significant side effects.  Continue XRT to his left hip and left skull base next week.  Return to clinic in 2 weeks for further evaluation and consideration of cycle 2.  Plan to reimage with PET scan at the conclusion of cycle 4. Bony disease: Will add Zometa with even number cycles.   Pleural effusion: Patient now has Pleurx catheter. Headache/neck pain: Chronic and unchanged.  Continue fentanyl patch and oxycodone as needed.  Metastatic focus of brain: Only 3 mm.  Monitor and repeat brain MRI last week of July.   Hypotension: Patient will receive 1 L IV fluids today. Poor  appetite: Patient was given a prescription for dexamethasone today.  Appreciate dietary input. Hyponatremia: Mild, monitor.  Patient sodium level is 134. Leukopenia: Mild.  Patient recently received Udenyca. Thrush: Patient was given a prescription for nystatin mouthwash.  Can consider Magic mouthwash in the future if needed. Atrial fibrillation/flutter: Appreciate cardiology input. History of diffuse large B-cell lymphoma: Patient completed cycle 6 of R-CHOP in November 2016 at [facility].  By report, PET scan in December 2016 revealed no evidence of disease.   Patient expressed understanding and was in agreement with this plan. He also understands that He can call clinic at any time with any questions, concerns, or complaints.    Cancer Staging  Adenocarcinoma, lung, right [facility]) Staging form: Lung, AJCC 8th Edition - Clinical stage from 04/28/2023: Stage IVB (cTX, cNX, pM1c) - Signed by Jeralyn Ruths, MD on 04/28/2023 Stage prefix: Initial diagnosis   Jeralyn Ruths, MD   05/13/2023 9:44 AM

## 2023-05-13 NOTE — Progress Notes (Signed)
Met with patient during follow up visit with Dr. Orlie Dakin. All questions answered during visit. Assisted pt with pleurx cath drainage. Dressed removed with moderate amount of serosanguinous fluid on dressing. Successful drainage of dark colored fluid removed. Pt tolerated it well with complaints of chest tightness after drainage complete. New dressing applied prior to pt leaving. Pt instructed to call with any questions or needs. Pt verbalized understanding.

## 2023-05-13 NOTE — Progress Notes (Signed)
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 His BP is still running on the low side. Pt. Isn't getting enough fluid in his daily diet intake. He is still having some nausea, he don't want to take any of the nausea medications due to side effects.

## 2023-05-17 ENCOUNTER — Encounter: Payer: Self-pay | Admitting: *Deleted

## 2023-05-17 ENCOUNTER — Ambulatory Visit
Admission: RE | Admit: 2023-05-17 | Discharge: 2023-05-17 | Disposition: A | Discharge status: Home / Self Care | Payer: Managed Care, Other (non HMO) | Source: Ambulatory Visit | Attending: Radiation Oncology | Admitting: Radiation Oncology

## 2023-05-17 ENCOUNTER — Other Ambulatory Visit: Payer: Self-pay

## 2023-05-17 DIAGNOSIS — Z510 Encounter for antineoplastic radiation therapy: Secondary | ICD-10-CM | POA: Diagnosis not present

## 2023-05-17 LAB — RAD ONC ARIA SESSION SUMMARY
Course Elapsed Days: 13
Plan Fractions Treated to Date: 6
Plan Prescribed Dose Per Fraction: 3 Gy
Plan Total Fractions Prescribed: 10
Plan Total Prescribed Dose: 30 Gy
Reference Point Dosage Given to Date: 18 Gy
Reference Point Session Dosage Given: 3 Gy
Session Number: 6

## 2023-05-17 NOTE — Progress Notes (Signed)
Assisted pt with drainage of his pleurx catheter. Dressing removed and it was clean, dry, and intact. Pleurx site was clean, dry, and w/o signs of infection. Successful drainage of amber colored fluid. Pt tolerated the drainage well. New dressing applied.

## 2023-05-18 ENCOUNTER — Ambulatory Visit: Payer: Managed Care, Other (non HMO)

## 2023-05-18 ENCOUNTER — Emergency Department: Payer: Managed Care, Other (non HMO)

## 2023-05-18 ENCOUNTER — Other Ambulatory Visit: Payer: Self-pay | Admitting: *Deleted

## 2023-05-18 ENCOUNTER — Telehealth: Payer: Self-pay | Admitting: *Deleted

## 2023-05-18 ENCOUNTER — Encounter (HOSPITAL_COMMUNITY): Payer: Self-pay

## 2023-05-18 ENCOUNTER — Ambulatory Visit
Admission: RE | Admit: 2023-05-18 | Discharge: 2023-05-18 | Disposition: A | Discharge status: Home / Self Care | Payer: Managed Care, Other (non HMO) | Source: Ambulatory Visit | Attending: Oncology | Admitting: Oncology

## 2023-05-18 ENCOUNTER — Emergency Department
Admission: EM | Admit: 2023-05-18 | Discharge: 2023-05-19 | Disposition: A | Discharge status: Home / Self Care | Payer: Managed Care, Other (non HMO) | Attending: Emergency Medicine | Admitting: Emergency Medicine

## 2023-05-18 ENCOUNTER — Other Ambulatory Visit: Payer: Self-pay

## 2023-05-18 ENCOUNTER — Encounter: Payer: Self-pay | Admitting: Emergency Medicine

## 2023-05-18 ENCOUNTER — Encounter: Payer: Self-pay | Admitting: *Deleted

## 2023-05-18 ENCOUNTER — Ambulatory Visit
Admission: RE | Admit: 2023-05-18 | Discharge: 2023-05-18 | Disposition: A | Discharge status: Home / Self Care | Payer: Managed Care, Other (non HMO) | Source: Ambulatory Visit | Attending: Radiation Oncology | Admitting: Radiation Oncology

## 2023-05-18 DIAGNOSIS — M25552 Pain in left hip: Secondary | ICD-10-CM | POA: Diagnosis present

## 2023-05-18 DIAGNOSIS — Z510 Encounter for antineoplastic radiation therapy: Secondary | ICD-10-CM | POA: Diagnosis not present

## 2023-05-18 DIAGNOSIS — C3491 Malignant neoplasm of unspecified part of right bronchus or lung: Secondary | ICD-10-CM | POA: Insufficient documentation

## 2023-05-18 DIAGNOSIS — C7951 Secondary malignant neoplasm of bone: Secondary | ICD-10-CM

## 2023-05-18 LAB — RAD ONC ARIA SESSION SUMMARY
Course Elapsed Days: 14
Plan Fractions Treated to Date: 7
Plan Prescribed Dose Per Fraction: 3 Gy
Plan Total Fractions Prescribed: 10
Plan Total Prescribed Dose: 30 Gy
Reference Point Dosage Given to Date: 21 Gy
Reference Point Session Dosage Given: 3 Gy
Session Number: 7

## 2023-05-18 LAB — CBC WITH DIFFERENTIAL/PLATELET
Abs Immature Granulocytes: 0.12 10*3/uL — ABNORMAL HIGH (ref 0.00–0.07)
Basophils Absolute: 0 10*3/uL (ref 0.0–0.1)
Basophils Relative: 1 %
Eosinophils Absolute: 0 10*3/uL (ref 0.0–0.5)
Eosinophils Relative: 1 %
HCT: 42.9 % (ref 39.0–52.0)
Hemoglobin: 13.2 g/dL (ref 13.0–17.0)
Immature Granulocytes: 6 %
Lymphocytes Relative: 21 %
Lymphs Abs: 0.4 10*3/uL — ABNORMAL LOW (ref 0.7–4.0)
MCH: 26.9 pg (ref 26.0–34.0)
MCHC: 30.8 g/dL (ref 30.0–36.0)
MCV: 87.4 fL (ref 80.0–100.0)
Monocytes Absolute: 0.6 10*3/uL (ref 0.1–1.0)
Monocytes Relative: 30 %
Neutro Abs: 0.9 10*3/uL — ABNORMAL LOW (ref 1.7–7.7)
Neutrophils Relative %: 41 %
Platelets: 159 10*3/uL (ref 150–400)
RBC: 4.91 MIL/uL (ref 4.22–5.81)
RDW: 16.7 % — ABNORMAL HIGH (ref 11.5–15.5)
Smear Review: NORMAL
WBC: 2 10*3/uL — ABNORMAL LOW (ref 4.0–10.5)
nRBC: 0 % (ref 0.0–0.2)

## 2023-05-18 LAB — BASIC METABOLIC PANEL WITH GFR
Anion gap: 9 (ref 5–15)
BUN: 16 mg/dL (ref 8–23)
CO2: 26 mmol/L (ref 22–32)
Calcium: 8.5 mg/dL — ABNORMAL LOW (ref 8.9–10.3)
Chloride: 100 mmol/L (ref 98–111)
Creatinine, Ser: 0.62 mg/dL (ref 0.61–1.24)
GFR, Estimated: >60 mL/min
Glucose, Bld: 198 mg/dL — ABNORMAL HIGH (ref 70–99)
Potassium: 4.5 mmol/L (ref 3.5–5.1)
Sodium: 135 mmol/L (ref 135–145)

## 2023-05-18 MED ORDER — OXYCODONE HCL 5 MG PO TABS
5.0000 mg | ORAL_TABLET | Freq: Once | ORAL | Status: AC
  Administered 2023-05-18: 5 mg via ORAL
  Filled 2023-05-18: qty 1

## 2023-05-18 MED ORDER — FENTANYL 25 MCG/HR TD PT72: 1.0000 | MEDICATED_PATCH | TRANSDERMAL | 0 refills | Status: DC

## 2023-05-18 MED ORDER — MORPHINE SULFATE (PF) 4 MG/ML IV SOLN
4.0000 mg | Freq: Once | INTRAVENOUS | Status: AC
  Administered 2023-05-18: 4 mg via INTRAVENOUS
  Filled 2023-05-18: qty 1

## 2023-05-18 NOTE — Telephone Encounter (Signed)
Called report  IMPRESSION: 1. Acute appearing proximal left femur fracture, involves femoral neck and possibly the trochanter, this would be consistent with pathologic fracture. 2. Degenerative changes of both hips.   These results will be called to the ordering clinician or representative by the Radiologist Assistant, and communication documented in the PACS or [employer].     Electronically Signed   By: Jasmine Pang M.D.   On: 05/18/2023 15:42

## 2023-05-18 NOTE — Telephone Encounter (Signed)
Received call from pt stating that when getting into his car yesterday he felt like he pulled a muscle in his left hip. States that he has pain in his left hip with specific positions but not all the time. Informed pt that he would need an xray of his left hip to rule out fracture. Pt stated that he wants to see how he does today and will get an xray if his pain does not improve. Orders placed and pt informed [facility]. Pt verbalized understanding.

## 2023-05-18 NOTE — ED Notes (Signed)
Xray  [facility]  with  [facility] 

## 2023-05-18 NOTE — ED Notes (Signed)
Xray  [facility]  with  [facility] °

## 2023-05-18 NOTE — ED Notes (Signed)
[facility]  called  per  Dr  Derrill Kay  MD

## 2023-05-18 NOTE — ED Notes (Signed)
[facility]   call  per  Dr  Derrill Kay  MD

## 2023-05-18 NOTE — ED Provider Notes (Signed)
[facility] Provider Note    Event Date/Time   First MD Initiated Contact with Patient 05/18/23 1650     (approximate)   History   Hip Pain   HPI  Charles Hickman is a 62 y.o. male who presents to the emergency department today from oncology clinic because of concerns for left hip fracture.  Concern for pathologic fracture on x-ray.  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  The pain got worse yesterday when he was transferring from his wheelchair to his car.     Physical Exam   Triage Vital Signs: ED Triage Vitals  Enc Vitals Group     BP 05/18/23 1621 123/80     Pulse Rate 05/18/23 1621 78     Resp 05/18/23 1621 18     Temp 05/18/23 1621 97.7 F (36.5 C)     Temp Source 05/18/23 1621 Oral     SpO2 05/18/23 1621 96 %     Weight 05/18/23 1623 162 lb (73.5 kg)     Height 05/18/23 1623 5\' 10"  (1.778 m)     Head Circumference --      Peak Flow --      Pain Score 05/18/23 1622 7     Pain Loc --      Pain Edu? --      Excl. in GC? --     Most recent vital signs: Vitals:   05/18/23 1621  BP: 123/80  Pulse: 78  Resp: 18  Temp: 97.7 F (36.5 C)  SpO2: 96%    General: Awake, alert, oriented. CV:  Good peripheral perfusion. Regular rate and rhythm. Resp:  Normal effort. Lungs clear. Abd:  No distention.    ED Results / Procedures / Treatments   Labs (all labs ordered are listed, but only abnormal results are displayed) Labs Reviewed  CBC WITH DIFFERENTIAL/PLATELET - Abnormal; Notable for the following components:      Result Value   WBC 2.0 (*)    RDW 16.7 (*)    All other components within normal limits  BASIC METABOLIC PANEL - Abnormal; Notable for the following components:   Glucose, Bld 198 (*)    Calcium 8.5 (*)    All other components within normal limits  PATHOLOGIST SMEAR REVIEW      RADIOLOGY Outside hip x-ray reviewed  I independently interpreted and visualized the CXR. My  interpretation: white out of right lung Radiology interpretation:  IMPRESSION:  Complete opacification of the right hemithorax similar to that seen  on the prior exam.    Diffuse stable interstitial changes are noted throughout the left  lung.     PROCEDURES:  Critical Care performed: No   MEDICATIONS ORDERED IN ED: Medications - No data to display   IMPRESSION / MDM / ASSESSMENT AND PLAN / ED COURSE  I reviewed the triage vital signs and the nursing notes.                              Differential diagnosis includes, but is not limited to, hip fracture  Patient's presentation is most consistent with acute presentation with potential threat to life or bodily function.  Patient presented to the emergency department today from oncology clinic after x-ray showed a left hip fracture.  Concern for pathologic fracture.  Discussed with Dr. Audelia Acton orthopedic doctor on-call who felt patient would be best served by going  to a higher level of care with dedicated orthopedic oncologist and capabilities.  Unfortunately both [facility] and [facility] were at capacity and cannot accept the patient.  Discussed with [facility] orthopedic doctor, Dr. Caswell Corwin who has accepted the patient in transfer, albeit on the wait list for the time being.      FINAL CLINICAL IMPRESSION(S) / ED DIAGNOSES   Final diagnoses:  Left hip pain    Note:  This document was prepared using Dragon voice recognition software and may include unintentional dictation errors.    Phineas Semen, MD 05/18/23 2230

## 2023-05-18 NOTE — ED Notes (Signed)
[facility]  transfer  center  called  per  Dr  Derrill Kay  MD

## 2023-05-18 NOTE — ED Triage Notes (Signed)
Pt sent from cancer center. Pt has lunch and bone cancer and currently receiving chemo and radiation to left him. Yesterday after radiation felt a  sharp pain when getting into car. Today at radiation MD sent pt to radiology for xray. Results showed a fracture and advised to come to ER.

## 2023-05-18 NOTE — ED Notes (Signed)
[facility]   transfer  center  called  per  Dr  Derrill Kay  MD

## 2023-05-18 NOTE — ED Notes (Signed)
Pt. Has right pleural catheter to drain when he needs to.

## 2023-05-18 NOTE — ED Notes (Signed)
Xray  [facility] with  [facility]

## 2023-05-19 ENCOUNTER — Encounter: Payer: Self-pay | Admitting: Oncology

## 2023-05-19 ENCOUNTER — Ambulatory Visit: Payer: Managed Care, Other (non HMO)

## 2023-05-19 DIAGNOSIS — M84552A Pathological fracture in neoplastic disease, left femur, initial encounter for fracture: Secondary | ICD-10-CM | POA: Insufficient documentation

## 2023-05-19 LAB — PATHOLOGIST SMEAR REVIEW

## 2023-05-19 MED ORDER — METOPROLOL SUCCINATE ER 50 MG PO TB24
100.0000 mg | ORAL_TABLET | Freq: Once | ORAL | Status: AC
  Administered 2023-05-19: 100 mg via ORAL
  Filled 2023-05-19: qty 2

## 2023-05-19 MED ORDER — OXYCODONE HCL 5 MG PO TABS
5.0000 mg | ORAL_TABLET | ORAL | Status: DC | PRN
  Administered 2023-05-19 (×2): 5 mg via ORAL
  Filled 2023-05-19 (×2): qty 1

## 2023-05-19 NOTE — ED Notes (Signed)
[facility] called Zollie Scale) to inform us that pt. is still on wait list .

## 2023-05-19 NOTE — ED Notes (Signed)
Pt has been accepted to [facility] ED-ED on wait list. Pt accepted on 05-18-23 at 20:10. Called [facility] (sarah) at 01:05am to check transfer status. Pt remains on wait list

## 2023-05-19 NOTE — ED Notes (Signed)
[facility] reviewed by this RN, pt ready for transport  

## 2023-05-19 NOTE — ED Notes (Signed)
Spoke with Khurum the coordinator at [facility], to get transfer underway.

## 2023-05-19 NOTE — ED Notes (Signed)
Attempted to give report to [facility] 5C9 RN, but RN was unavailable. Left my phone number with the secretary for them to call me back.

## 2023-05-20 ENCOUNTER — Ambulatory Visit: Payer: Managed Care, Other (non HMO)

## 2023-05-23 ENCOUNTER — Ambulatory Visit: Payer: Managed Care, Other (non HMO)

## 2023-05-24 ENCOUNTER — Ambulatory Visit: Payer: Managed Care, Other (non HMO)

## 2023-05-25 ENCOUNTER — Ambulatory Visit: Payer: Managed Care, Other (non HMO)

## 2023-05-25 ENCOUNTER — Inpatient Hospital Stay: Payer: Managed Care, Other (non HMO)

## 2023-05-25 ENCOUNTER — Encounter: Payer: Self-pay | Admitting: Oncology

## 2023-05-26 ENCOUNTER — Encounter: Payer: Self-pay | Admitting: Oncology

## 2023-05-26 ENCOUNTER — Ambulatory Visit: Payer: Managed Care, Other (non HMO)

## 2023-05-26 ENCOUNTER — Other Ambulatory Visit: Payer: Self-pay | Admitting: Cardiovascular Disease

## 2023-05-26 MED FILL — Dexamethasone Sodium Phosphate Inj 100 MG/10ML: INTRAMUSCULAR | Qty: 1 | Status: AC

## 2023-05-26 MED FILL — Fosaprepitant Dimeglumine For IV Infusion 150 MG (Base Eq): INTRAVENOUS | Qty: 5 | Status: AC

## 2023-05-27 ENCOUNTER — Ambulatory Visit: Payer: Managed Care, Other (non HMO) | Admitting: Oncology

## 2023-05-27 ENCOUNTER — Ambulatory Visit: Payer: Managed Care, Other (non HMO)

## 2023-05-27 ENCOUNTER — Inpatient Hospital Stay: Payer: Managed Care, Other (non HMO) | Admitting: Oncology

## 2023-05-27 ENCOUNTER — Other Ambulatory Visit: Payer: Self-pay | Admitting: Oncology

## 2023-05-27 ENCOUNTER — Inpatient Hospital Stay: Payer: Managed Care, Other (non HMO)

## 2023-05-27 ENCOUNTER — Other Ambulatory Visit: Payer: Managed Care, Other (non HMO)

## 2023-05-30 ENCOUNTER — Inpatient Hospital Stay: Payer: Managed Care, Other (non HMO)

## 2023-05-30 ENCOUNTER — Ambulatory Visit: Payer: Managed Care, Other (non HMO)

## 2023-05-31 ENCOUNTER — Ambulatory Visit: Payer: Managed Care, Other (non HMO)

## 2023-06-01 ENCOUNTER — Encounter: Payer: Self-pay | Admitting: *Deleted

## 2023-06-01 ENCOUNTER — Ambulatory Visit: Payer: Managed Care, Other (non HMO)

## 2023-06-01 ENCOUNTER — Ambulatory Visit
Admission: RE | Admit: 2023-06-01 | Discharge: 2023-06-01 | Disposition: A | Discharge status: Home / Self Care | Payer: Managed Care, Other (non HMO) | Source: Ambulatory Visit | Attending: Radiation Oncology | Admitting: Radiation Oncology

## 2023-06-01 ENCOUNTER — Telehealth: Payer: Self-pay | Admitting: Cardiovascular Disease

## 2023-06-01 ENCOUNTER — Ambulatory Visit: Admission: RE | Admit: 2023-06-01 | Payer: Managed Care, Other (non HMO) | Source: Ambulatory Visit

## 2023-06-01 DIAGNOSIS — C3491 Malignant neoplasm of unspecified part of right bronchus or lung: Secondary | ICD-10-CM | POA: Insufficient documentation

## 2023-06-01 DIAGNOSIS — R0902 Hypoxemia: Secondary | ICD-10-CM | POA: Diagnosis not present

## 2023-06-01 DIAGNOSIS — Z79899 Other long term (current) drug therapy: Secondary | ICD-10-CM | POA: Diagnosis not present

## 2023-06-01 DIAGNOSIS — Z5111 Encounter for antineoplastic chemotherapy: Secondary | ICD-10-CM | POA: Insufficient documentation

## 2023-06-01 DIAGNOSIS — C7800 Secondary malignant neoplasm of unspecified lung: Secondary | ICD-10-CM | POA: Diagnosis not present

## 2023-06-01 DIAGNOSIS — I4892 Unspecified atrial flutter: Secondary | ICD-10-CM | POA: Diagnosis not present

## 2023-06-01 DIAGNOSIS — E871 Hypo-osmolality and hyponatremia: Secondary | ICD-10-CM | POA: Diagnosis not present

## 2023-06-01 DIAGNOSIS — Z8572 Personal history of non-Hodgkin lymphomas: Secondary | ICD-10-CM | POA: Insufficient documentation

## 2023-06-01 DIAGNOSIS — G893 Neoplasm related pain (acute) (chronic): Secondary | ICD-10-CM | POA: Diagnosis not present

## 2023-06-01 DIAGNOSIS — Z510 Encounter for antineoplastic radiation therapy: Secondary | ICD-10-CM | POA: Insufficient documentation

## 2023-06-01 DIAGNOSIS — C7951 Secondary malignant neoplasm of bone: Secondary | ICD-10-CM | POA: Insufficient documentation

## 2023-06-01 DIAGNOSIS — Z7901 Long term (current) use of anticoagulants: Secondary | ICD-10-CM | POA: Diagnosis not present

## 2023-06-01 DIAGNOSIS — D72819 Decreased white blood cell count, unspecified: Secondary | ICD-10-CM | POA: Diagnosis not present

## 2023-06-01 NOTE — Progress Notes (Signed)
Met with patient and his wife during radiation visit today. Pt stated that he needs help removing dressing from his recent hip surgery. Per his discharge instructions, his dressing needs to be removed on 6/10 and leave open to air. Incision site is clean, dry, and intact at the time of dressing removal. Pt does have skin break down to his buttocks from recent hospital stay for his hip surgery. Sacral dressing applied and informed pt to change every 7 days or when dressing is soiled. Additional dressing given to patient. Encouraged pt to use pillows to relieve pressure and to change positions frequently while sitting and lying down. During visit, assisted with pleurx catheter drainage as well. Successful drainage of 450 dark colored fluid removed. Pt tolerated it well. New dressing applied. Will follow up at next clinic visit. Instructed pt to call with any questions or needs. Pt verbalized understanding.

## 2023-06-01 NOTE — Telephone Encounter (Signed)
Patient c/o Palpitations:  High priority if patient c/o lightheadedness, shortness of breath, or chest pain  How long have you had palpitations/irregular HR/ Afib? Patient states it started yesterday afternoon. Are you having the symptoms now? Yes   Are you currently experiencing lightheadedness, SOB or CP? Patient states he has SOB because he has a collapsed right lung, he is a patient at the cancer center.  Do you have a history of afib (atrial fibrillation) or irregular heart rhythm? Yes   Have you checked your BP or HR? (document readings if available): BP 101/61, HR 92  Are you experiencing any other symptoms? No    Patient states his Lourena Simmonds mobile told him possible A-Fib.

## 2023-06-01 NOTE — Telephone Encounter (Signed)
I spoke with the patient. He states that he was admitted 05/09/23- 05/11/23 for a-fib.  During that visit, his metoprolol succinate was increased from 50 mg once daily to 100 mg once daily.  He states that yesterday, around noon, he went back into a-fib. HR's on his kardia mobile were ~ 160 bpm. He states that he took his PRN diltiazem 30 mg yesterday.  However, he did not convert back to NSR until just recently.   He checked his BP/ HR while on the phone with me and he was 113/71 (78). SBP's typically run 90s- low 100s.  The patient was recently admitted for a broken hip as well- he is taking cancer treatments and cancer is in his bones.  He broke his hip when getting in the car after a cancer treatment. He had hip surgery at [facility] ~ 05/19/23.  He is recovering well from his surgery.  I have confirmed with the patient that he is on eliquis 5 mg BID as well.  I have advised him that due to the short duration of his atrial fibrillation, there is no need bring him into the office or make any medication changes at this time. He is aware to continue to monitor his BP/ HR and occurrences of atrial fibrillation. He will call the office sooner if needed for low BP with symptoms of dizziness/ lightheadedness or prolonged episodes of a-fib.   Otherwise, the patient is aware that we will see him at his already scheduled follow up on 06/14/23 with Cadence Fransico Michael, PA.  The patient voices understanding of the above and is agreeable.

## 2023-06-02 ENCOUNTER — Ambulatory Visit: Payer: Managed Care, Other (non HMO)

## 2023-06-02 ENCOUNTER — Encounter: Payer: Self-pay | Admitting: *Deleted

## 2023-06-02 DIAGNOSIS — Z510 Encounter for antineoplastic radiation therapy: Secondary | ICD-10-CM | POA: Diagnosis not present

## 2023-06-02 MED FILL — Fosaprepitant Dimeglumine For IV Infusion 150 MG (Base Eq): INTRAVENOUS | Qty: 5 | Status: AC

## 2023-06-02 MED FILL — Dexamethasone Sodium Phosphate Inj 100 MG/10ML: INTRAMUSCULAR | Qty: 1 | Status: AC

## 2023-06-02 NOTE — Progress Notes (Signed)
Pt called in to report that mouth and gums continue to feel sore and raw despite taking nystatin. Informed pt that can send in prescription for magic mouthwash that may work better. Rx faxed to [facility] pharmacy per pt request. Nothing further needed at this time.

## 2023-06-03 ENCOUNTER — Inpatient Hospital Stay: Payer: Managed Care, Other (non HMO) | Admitting: Oncology

## 2023-06-03 ENCOUNTER — Ambulatory Visit: Payer: Managed Care, Other (non HMO)

## 2023-06-03 ENCOUNTER — Inpatient Hospital Stay: Payer: Managed Care, Other (non HMO) | Attending: Oncology

## 2023-06-03 ENCOUNTER — Inpatient Hospital Stay (HOSPITAL_BASED_OUTPATIENT_CLINIC_OR_DEPARTMENT_OTHER): Payer: Managed Care, Other (non HMO) | Attending: Oncology | Admitting: Nurse Practitioner

## 2023-06-03 ENCOUNTER — Inpatient Hospital Stay: Payer: Managed Care, Other (non HMO)

## 2023-06-03 ENCOUNTER — Encounter: Payer: Self-pay | Admitting: *Deleted

## 2023-06-03 VITALS — BP 98/72 | HR 77 | Temp 97.4°F | Resp 16 | Wt 148.0 lb

## 2023-06-03 VITALS — BP 98/72 | HR 77 | Temp 97.4°F | Resp 16 | Wt 148.7 lb

## 2023-06-03 DIAGNOSIS — C3491 Malignant neoplasm of unspecified part of right bronchus or lung: Secondary | ICD-10-CM

## 2023-06-03 DIAGNOSIS — Z7901 Long term (current) use of anticoagulants: Secondary | ICD-10-CM | POA: Insufficient documentation

## 2023-06-03 DIAGNOSIS — G893 Neoplasm related pain (acute) (chronic): Secondary | ICD-10-CM

## 2023-06-03 DIAGNOSIS — C7800 Secondary malignant neoplasm of unspecified lung: Secondary | ICD-10-CM | POA: Insufficient documentation

## 2023-06-03 DIAGNOSIS — M84552S Pathological fracture in neoplastic disease, left femur, sequela: Secondary | ICD-10-CM

## 2023-06-03 DIAGNOSIS — C7951 Secondary malignant neoplasm of bone: Secondary | ICD-10-CM | POA: Insufficient documentation

## 2023-06-03 DIAGNOSIS — Z5111 Encounter for antineoplastic chemotherapy: Secondary | ICD-10-CM | POA: Diagnosis not present

## 2023-06-03 DIAGNOSIS — I480 Paroxysmal atrial fibrillation: Secondary | ICD-10-CM

## 2023-06-03 DIAGNOSIS — Z510 Encounter for antineoplastic radiation therapy: Secondary | ICD-10-CM | POA: Insufficient documentation

## 2023-06-03 DIAGNOSIS — Z95828 Presence of other vascular implants and grafts: Secondary | ICD-10-CM | POA: Diagnosis not present

## 2023-06-03 DIAGNOSIS — I4892 Unspecified atrial flutter: Secondary | ICD-10-CM | POA: Insufficient documentation

## 2023-06-03 DIAGNOSIS — D72819 Decreased white blood cell count, unspecified: Secondary | ICD-10-CM | POA: Insufficient documentation

## 2023-06-03 DIAGNOSIS — C801 Malignant (primary) neoplasm, unspecified: Secondary | ICD-10-CM

## 2023-06-03 DIAGNOSIS — F411 Generalized anxiety disorder: Secondary | ICD-10-CM

## 2023-06-03 DIAGNOSIS — Z79899 Other long term (current) drug therapy: Secondary | ICD-10-CM | POA: Insufficient documentation

## 2023-06-03 DIAGNOSIS — R0902 Hypoxemia: Secondary | ICD-10-CM | POA: Insufficient documentation

## 2023-06-03 DIAGNOSIS — E871 Hypo-osmolality and hyponatremia: Secondary | ICD-10-CM | POA: Insufficient documentation

## 2023-06-03 DIAGNOSIS — G479 Sleep disorder, unspecified: Secondary | ICD-10-CM

## 2023-06-03 LAB — CBC WITH DIFFERENTIAL (CANCER CENTER ONLY)
Abs Immature Granulocytes: 0.27 10*3/uL — ABNORMAL HIGH (ref 0.00–0.07)
Basophils Absolute: 0.1 10*3/uL (ref 0.0–0.1)
Basophils Relative: 0 %
Eosinophils Absolute: 0 10*3/uL (ref 0.0–0.5)
Eosinophils Relative: 0 %
HCT: 41 % (ref 39.0–52.0)
Hemoglobin: 13.5 g/dL (ref 13.0–17.0)
Immature Granulocytes: 2 %
Lymphocytes Relative: 7 %
Lymphs Abs: 0.9 10*3/uL (ref 0.7–4.0)
MCH: 28 pg (ref 26.0–34.0)
MCHC: 32.9 g/dL (ref 30.0–36.0)
MCV: 84.9 fL (ref 80.0–100.0)
Monocytes Absolute: 1.1 10*3/uL — ABNORMAL HIGH (ref 0.1–1.0)
Monocytes Relative: 9 %
Neutro Abs: 10.2 10*3/uL — ABNORMAL HIGH (ref 1.7–7.7)
Neutrophils Relative %: 82 %
Platelet Count: 214 10*3/uL (ref 150–400)
RBC: 4.83 MIL/uL (ref 4.22–5.81)
RDW: 18.9 % — ABNORMAL HIGH (ref 11.5–15.5)
WBC Count: 12.5 10*3/uL — ABNORMAL HIGH (ref 4.0–10.5)
nRBC: 0 % (ref 0.0–0.2)

## 2023-06-03 LAB — CMP (CANCER CENTER ONLY)
ALT: 29 U/L (ref 0–44)
AST: 21 U/L (ref 15–41)
Albumin: 2.8 g/dL — ABNORMAL LOW (ref 3.5–5.0)
Alkaline Phosphatase: 142 U/L — ABNORMAL HIGH (ref 38–126)
Anion gap: 9 (ref 5–15)
BUN: 20 mg/dL (ref 8–23)
CO2: 25 mmol/L (ref 22–32)
Calcium: 8.1 mg/dL — ABNORMAL LOW (ref 8.9–10.3)
Chloride: 97 mmol/L — ABNORMAL LOW (ref 98–111)
Creatinine: 0.69 mg/dL (ref 0.61–1.24)
GFR, Estimated: >60 mL/min (ref >60)
Glucose, Bld: 228 mg/dL — ABNORMAL HIGH (ref 70–99)
Potassium: 4 mmol/L (ref 3.5–5.1)
Sodium: 131 mmol/L — ABNORMAL LOW (ref 135–145)
Total Bilirubin: 0.8 mg/dL (ref 0.3–1.2)
Total Protein: 5.7 g/dL — ABNORMAL LOW (ref 6.5–8.1)

## 2023-06-03 MED ORDER — SODIUM CHLORIDE 0.9 % IV SOLN
683.4000 mg | Freq: Once | INTRAVENOUS | Status: AC
  Administered 2023-06-03: 680 mg via INTRAVENOUS
  Filled 2023-06-03: qty 68

## 2023-06-03 MED ORDER — SODIUM CHLORIDE 0.9 % IV SOLN
150.0000 mg | Freq: Once | INTRAVENOUS | Status: AC
  Administered 2023-06-03: 150 mg via INTRAVENOUS
  Filled 2023-06-03: qty 5
  Filled 2023-06-03: qty 150

## 2023-06-03 MED ORDER — SODIUM CHLORIDE 0.9 % IV SOLN
753.0000 mg | Freq: Once | INTRAVENOUS | Status: DC
Start: 2023-06-03 — End: 2023-06-03

## 2023-06-03 MED ORDER — SODIUM CHLORIDE 0.9 % IV SOLN
10.0000 mg | Freq: Once | INTRAVENOUS | Status: AC
  Administered 2023-06-03: 10 mg via INTRAVENOUS
  Filled 2023-06-03: qty 1
  Filled 2023-06-03: qty 10

## 2023-06-03 MED ORDER — CALCIUM CARBONATE 600 MG PO TABS: 1200.0000 mg | ORAL_TABLET | Freq: Every day | ORAL | 2 refills | Status: DC

## 2023-06-03 MED ORDER — SODIUM CHLORIDE 0.9 % IV SOLN
Freq: Once | INTRAVENOUS | Status: DC
  Filled 2023-06-03: qty 250

## 2023-06-03 MED ORDER — PALONOSETRON HCL INJECTION 0.25 MG/5ML
0.2500 mg | Freq: Once | INTRAVENOUS | Status: AC
  Administered 2023-06-03: 0.25 mg via INTRAVENOUS
  Filled 2023-06-03: qty 5

## 2023-06-03 MED ORDER — FAMOTIDINE IN NACL 20-0.9 MG/50ML-% IV SOLN
20.0000 mg | Freq: Once | INTRAVENOUS | Status: AC
  Administered 2023-06-03: 20 mg via INTRAVENOUS
  Filled 2023-06-03: qty 50

## 2023-06-03 MED ORDER — MIRTAZAPINE 7.5 MG PO TABS: 7.5000 mg | ORAL_TABLET | Freq: Every day | ORAL | 1 refills | Status: DC

## 2023-06-03 MED ORDER — DIPHENHYDRAMINE HCL 50 MG/ML IJ SOLN
25.0000 mg | Freq: Once | INTRAMUSCULAR | Status: AC
  Administered 2023-06-03: 25 mg via INTRAVENOUS
  Filled 2023-06-03: qty 1

## 2023-06-03 MED ORDER — VITAMIN D 25 MCG (1000 UNIT) PO TABS: 1000.0000 [IU] | ORAL_TABLET | Freq: Every day | ORAL | 1 refills | Status: DC

## 2023-06-03 MED ORDER — SODIUM CHLORIDE 0.9 % IV SOLN
Freq: Once | INTRAVENOUS | Status: AC
  Filled 2023-06-03: qty 250

## 2023-06-03 MED ORDER — SODIUM CHLORIDE 0.9% FLUSH
10.0000 mL | INTRAVENOUS | Status: DC | PRN
  Administered 2023-06-03: 10 mL
  Filled 2023-06-03: qty 10

## 2023-06-03 MED ORDER — DEXAMETHASONE 2 MG PO TABS
2.0000 mg | ORAL_TABLET | Freq: Every day | ORAL | 1 refills | Status: DC
Start: 2023-06-03 — End: 2023-06-21

## 2023-06-03 MED ORDER — HEPARIN SOD (PORK) LOCK FLUSH 100 UNIT/ML IV SOLN
500.0000 [IU] | Freq: Once | INTRAVENOUS | Status: AC | PRN
  Administered 2023-06-03: 500 [IU]
  Filled 2023-06-03: qty 5

## 2023-06-03 MED ORDER — SODIUM CHLORIDE 0.9 % IV SOLN
200.0000 mg/m2 | Freq: Once | INTRAVENOUS | Status: AC
  Administered 2023-06-03: 390 mg via INTRAVENOUS
  Filled 2023-06-03: qty 65

## 2023-06-03 NOTE — Patient Instructions (Signed)
[facility]  Discharge Instructions: Thank you for choosing [facility] to provide your oncology and hematology care.  If you have a lab appointment with the Cancer Center, please go directly to the Cancer Center and check in at the registration area.  Wear comfortable clothing and clothing appropriate for easy access to any Portacath or PICC line.   We strive to give you quality time with your provider. You may need to reschedule your appointment if you arrive late (15 or more minutes).  Arriving late affects you and other patients whose appointments are after yours.  Also, if you miss three or more appointments without notifying the office, you may be dismissed from the clinic at the provider's discretion.      For prescription refill requests, have your pharmacy contact our office and allow 72 hours for refills to be completed.    Today you received the following chemotherapy and/or immunotherapy agents taxol/carboplatin    To help prevent nausea and vomiting after your treatment, we encourage you to take your nausea medication as directed.  BELOW ARE SYMPTOMS THAT SHOULD BE REPORTED IMMEDIATELY: *FEVER GREATER THAN 100.4 F (38 C) OR HIGHER *CHILLS OR SWEATING *NAUSEA AND VOMITING THAT IS NOT CONTROLLED WITH YOUR NAUSEA MEDICATION *UNUSUAL SHORTNESS OF BREATH *UNUSUAL BRUISING OR BLEEDING *URINARY PROBLEMS (pain or burning when urinating, or frequent urination) *BOWEL PROBLEMS (unusual diarrhea, constipation, pain near the anus) TENDERNESS IN MOUTH AND THROAT WITH OR WITHOUT PRESENCE OF ULCERS (sore throat, sores in mouth, or a toothache) UNUSUAL RASH, SWELLING OR PAIN  UNUSUAL VAGINAL DISCHARGE OR ITCHING   Items with * indicate a potential emergency and should be followed up as soon as possible or go to the Emergency Department if any problems should occur.  Please show the CHEMOTHERAPY ALERT CARD or IMMUNOTHERAPY ALERT CARD at  check-in to the Emergency Department and triage nurse.  Should you have questions after your visit or need to cancel or reschedule your appointment, please contact [facility]  [phone] and follow the prompts.  Office hours are 8:00 a.m. to 4:30 p.m. Monday - Friday. Please note that voicemails left after 4:00 p.m. may not be returned until the following business day.  We are closed weekends and major holidays. You have access to a nurse at all times for urgent questions. Please call the main number to the clinic [phone] and follow the prompts.  For any non-urgent questions, you may also contact your provider using MyChart. We now offer e-Visits for anyone 29 and older to request care online for non-urgent symptoms. For details visit [email or web address].   Also download the MyChart app! Go to the app store, search "MyChart", open the app, select [facility], and log in with your MyChart username and password.

## 2023-06-03 NOTE — Progress Notes (Signed)
Met with patient during infusion. All questions answered during visit. Assisted pt with pleurx drainage. Successful drainage of amber colored fluid. Dressing was clean, dry, and intact. Pt tolerated the procedure well with minimal discomfort. Reviewed upcoming appts. Instructed to call with any questions or needs. Pt verbalized understanding. Nothing further needed at this time.

## 2023-06-03 NOTE — Progress Notes (Signed)
[facility]  [phone] [phone]  ID: Charles Hickman OB: 1961-07-31  MR#: [id number]  AOZ#:[id number]  Patient Care Team: Dorothey Baseman, MD as PCP - General (Family Medicine) Mariah Milling Tollie Pizza, MD as PCP - Cardiology (Cardiology) Glory Buff, RN as Oncology Nurse Navigator  CHIEF COMPLAINT: Stage IV adenocarcinoma of the right lung.  INTERVAL HISTORY: Patient returns to clinic today for further evaluation and consideration of cycle 2 of carboplatinum, taxol, and avastin. He continues to have episodes of a fib/a flutter and with associated shortness of breath. Oxygen levels do not drop during these episodes. He complains of poor sleep. Has difficulty initiating sleep and staying asleep. This started in the hospital but has persisted. Pain is well controlled with fentanyl and some oxycodone. Moving bowels without problems. He has a small pressure ulcer on his bottom. Surgical wound is improving. No neurologic complaints. No fevers, chills. No chest pain, cough, or hemoptysis. Denies nausea, vomiting, constipation, or diarrhea. Denies urinary complaints. Weight and appetite have improved with steroids.    REVIEW OF SYSTEMS:   Review of Systems  Constitutional:  Positive for malaise/fatigue. Negative for chills, fever and weight loss.  HENT:  Negative for hearing loss, nosebleeds, sore throat and tinnitus.   Eyes:  Negative for blurred vision and double vision.  Respiratory:  Positive for shortness of breath. Negative for cough, hemoptysis, sputum production and wheezing.   Cardiovascular:  Positive for leg swelling. Negative for chest pain and palpitations.  Gastrointestinal:  Negative for abdominal pain, blood in stool, constipation, diarrhea, melena, nausea and vomiting.  Genitourinary:  Negative for dysuria and urgency.  Musculoskeletal:  Positive for joint pain. Negative for back pain, falls and myalgias.  Skin:  Negative for itching and rash.   Neurological:  Negative for dizziness, tingling, sensory change, loss of consciousness, weakness and headaches.  Endo/Heme/Allergies:  Negative for environmental allergies. Does not bruise/bleed easily.  Psychiatric/Behavioral:  Negative for depression. The patient is nervous/anxious and has insomnia.   As per HPI. Otherwise, a complete review of systems is negative.  PAST MEDICAL HISTORY: Past Medical History:  Diagnosis Date   Abnormal EKG 11/15/2017   Abnormal stress test    Atrial fibrillation (HCC)    CAD in native artery    a. 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 No EKG changes concerning for ischemia at peak stress or in recovery. Mod to high risk scan; b. LHC 12/15/17: pLAD 75% w/ FFR 0.74 s/p PCI/DES, mRCA 90%, CTO dRCA w/ L-R colatts, EF 45-50%, inf HK    Diabetes mellitus (HCC)    HCV (hepatitis C virus)    HLD (hyperlipidemia)    Hypertension    Large cell lymphoma (HCC)    a. s/p 6 cycles of R-CHOP   Peripheral neuropathy    Stroke [facility])    Systolic dysfunction    a. TTE 8/16: EF > 55%, aortic sclerosis, normal RV systolic function; b. LHC 12/15/17: EF 45-50%, inf HK    PAST SURGICAL HISTORY: Past Surgical History:  Procedure Laterality Date   APPENDECTOMY     CORONARY PRESSURE/FFR STUDY N/A 12/15/2017   Procedure: INTRAVASCULAR PRESSURE WIRE/FFR STUDY;  Surgeon: Iran Ouch, MD;  Location: [facility] INVASIVE CV LAB;  Service: Cardiovascular;  Laterality: N/A;   IR GUIDED DRAIN W CATHETER PLACEMENT  05/02/2023   IR  IMAGING GUIDED PORT INSERTION  05/02/2023   IR RADIOLOGIST EVAL & MGMT  05/10/2023   IR US GUIDE BX ASP/DRAIN  05/02/2023   LEFT HEART CATH AND CORONARY ANGIOGRAPHY N/A 12/15/2017   Procedure: LEFT HEART CATH AND CORONARY ANGIOGRAPHY;  Surgeon: Iran Ouch, MD;  Location: [facility] INVASIVE CV LAB;  Service:  Cardiovascular;  Laterality: N/A;   TONSILLECTOMY AND ADENOIDECTOMY      FAMILY HISTORY: Family History  Problem Relation Age of Onset   Hypertension Mother    Hyperlipidemia Mother    Stroke Mother    Diabetes Mother    Kidney disease Father    Hypertension Father    Diabetes Father    Dementia Maternal Grandmother     ADVANCED DIRECTIVES (Y/N):  N  HEALTH MAINTENANCE: Social History   Tobacco Use   Smoking status: Former    Packs/day: .5    Types: Cigarettes    Quit date: 05/04/2023    Years since quitting: 0.0   Smokeless tobacco: Never   Tobacco comments:    Down to a couple of cigarettes a day  Substance Use Topics   Alcohol use: Yes    Comment: occasional   Drug use: No     Colonoscopy:  Bone density:  Lipid panel:  No Known Allergies  Current Outpatient Medications  Medication Sig Dispense Refill   calcium carbonate (CALCIUM 600) 600 MG TABS tablet Take 2 tablets (1,200 mg total) by mouth daily. 90 tablet 2   cholecalciferol (VITAMIN D3) 25 MCG (1000 UNIT) tablet Take 1 tablet (1,000 Units total) by mouth daily. 90 tablet 1   mirtazapine (REMERON) 7.5 MG tablet Take 1 tablet (7.5 mg total) by mouth at bedtime. For mood, appetite, and sleep 30 tablet 1   senna-docusate (SENOKOT-S) 8.6-50 MG tablet Take 2 tablets by mouth daily.     apixaban (ELIQUIS) 5 MG TABS tablet TAKE 1 TABLET BY MOUTH TWICE A DAY 180 tablet 1   blood glucose meter kit and supplies KIT Dispense based on patient and insurance preference. Use 2 times daily as directed. (FOR ICD-10 E11.9). (Patient not taking: Reported on 05/09/2023) 1 each 0   dexamethasone (DECADRON) 2 MG tablet Take 1 tablet (2 mg total) by mouth daily. 30 tablet 1   diltiazem (CARDIZEM) 30 MG tablet Take 1 tablet (30 mg total) by mouth daily as needed (for fast heart rate). 30 tablet 11   fentaNYL (DURAGESIC) 25 MCG/HR Place 1 patch onto the skin every 3 (three) days. 10 patch 0   JARDIANCE 25 MG TABS tablet TAKE 1  TABLET BY MOUTH EVERY DAY 90 tablet 2   lidocaine-prilocaine (EMLA) cream Apply to affected area once 30 g 3   metoprolol succinate (TOPROL-XL) 100 MG 24 hr tablet Take 1 tablet (100 mg total) by mouth daily. TAKE WITH OR IMMEDIATELY FOLLOWING A MEAL. 90 tablet 1   Multiple Vitamin (MULTI-VITAMIN) tablet Take 1 tablet by mouth daily.     naloxone (NARCAN) nasal spray 4 mg/0.1 mL See admin instructions.     nitroGLYCERIN (NITROSTAT) 0.4 MG SL tablet Place 1 tablet (0.4 mg total) under the tongue every 5 (five) minutes as needed for chest pain. 25 tablet 3   nystatin (MYCOSTATIN) 100000 UNIT/ML suspension Take 5 mLs (500,000 Units total) by mouth 4 (four) times daily. Swish and spit. 473 mL 0   ondansetron (ZOFRAN) 8 MG tablet Take 1 tablet (8 mg total) by mouth every 8 (eight) hours as needed for nausea or vomiting. Start  on the third day after carboplatin. 60 tablet 2   ONETOUCH VERIO test strip USE TWICE A DAY AS DIRECTED (Patient not taking: Reported on 05/09/2023) 100 strip 0   oxyCODONE (OXY IR/ROXICODONE) 5 MG immediate release tablet Take 1 tablet (5 mg total) by mouth every 6 (six) hours as needed for severe pain. 60 tablet 0   prochlorperazine (COMPAZINE) 10 MG tablet Take 1 tablet (10 mg total) by mouth every 6 (six) hours as needed for nausea or vomiting. 60 tablet 2   rosuvastatin (CRESTOR) 10 MG tablet TAKE 1 TABLET BY MOUTH EVERY DAY 90 tablet 0   No current facility-administered medications for this visit.   Facility-Administered Medications Ordered in Other Visits  Medication Dose Route Frequency Provider Last Rate Last Admin   0.9 %  sodium chloride infusion   Intravenous Once Orlie Dakin, Tollie Pizza, MD       CARBOplatin (PARAPLATIN) 680 mg in sodium chloride 0.9 % 250 mL chemo infusion  680 mg Intravenous Once Alinda Dooms, NP       PACLitaxel (TAXOL) 390 mg in sodium chloride 0.9 % 500 mL chemo infusion (> 80mg /m2)  200 mg/m2 (Treatment Plan Recorded) Intravenous Once Alinda Dooms, NP 188 mL/hr at 06/03/23 1243 390 mg at 06/03/23 1243    OBJECTIVE: Vitals:   06/03/23 1037  BP: 98/72  Pulse: 77  Resp: 16  Temp: (!) 97.4 F (36.3 C)  SpO2: 96%      Body mass index is 21.24 kg/m.     ECOG FS:3 - Symptomatic, >50% confined to bed  General: Well-developed, thin build. Accompanied by wife. On oxygen via [place].  Eyes: Pink conjunctiva, anicteric sclera. HEENT: Normocephalic, moist mucous membranes. Lungs:  Pleurx catheter noted. Breath sounds absent on right Heart: Regular rate and rhythm. Pulse regular.  Abdomen: Soft, nontender, no obvious distention. Musculoskeletal: surgical incision of left hip well approximated with sutures intact. Mepilex on sacrum. Mild 1+ edema of left leg.  Neuro: Alert, answering all questions appropriately. Cranial nerves grossly intact. Skin: No rashes or petechiae noted. Pale.  Psych: Anxious affect.    LAB RESULTS:  Lab Results  Component Value Date   NA 131 (L) 06/03/2023   K 4.0 06/03/2023   CL 97 (L) 06/03/2023   CO2 25 06/03/2023   GLUCOSE 228 (H) 06/03/2023   BUN 20 06/03/2023   CREATININE 0.69 06/03/2023   CALCIUM 8.1 (L) 06/03/2023   PROT 5.7 (L) 06/03/2023   ALBUMIN 2.8 (L) 06/03/2023   AST 21 06/03/2023   ALT 29 06/03/2023   ALKPHOS 142 (H) 06/03/2023   BILITOT 0.8 06/03/2023   GFRNONAA >60 06/03/2023   GFRAA 87 12/11/2020    Lab Results  Component Value Date   WBC 12.5 (H) 06/03/2023   NEUTROABS 10.2 (H) 06/03/2023   HGB 13.5 06/03/2023   HCT 41.0 06/03/2023   MCV 84.9 06/03/2023   PLT 214 06/03/2023     STUDIES: DG Chest Portable 1 View  Result Date: 05/18/2023 CLINICAL DATA:  Preop evaluation for hip surgery EXAM: PORTABLE CHEST 1 VIEW COMPARISON:  05/10/2023 FINDINGS: There is again noted complete opacification of the right hemithorax identified. PleurX catheter is noted in place. Right chest wall port is again seen. Stable interstitial opacities are noted throughout the left lung. No  new focal abnormality is seen. IMPRESSION: Complete opacification of the right hemithorax similar to that seen on the prior exam. Diffuse stable interstitial changes are noted throughout the left lung. Electronically Signed  By: Alcide Clever M.D.   On: 05/18/2023 17:45   DG HIP UNILAT WITH PELVIS 2-3 VIEWS LEFT  Result Date: 05/18/2023 CLINICAL DATA:  Left-sided hip pain EXAM: DG HIP (WITH OR WITHOUT PELVIS) 2-3V LEFT COMPARISON:  PET CT 04/19/2023 FINDINGS: SI joints are non widened. Pubic symphysis and rami appear intact. Degenerative changes of both hips. Acute left femoral neck fracture, question lucency extending to the trochanter on frontal view of the pelvis. Underlying heterogenous mineralization corresponding to the lesion seen on PET CT previously. IMPRESSION: 1. Acute appearing proximal left femur fracture, involves femoral neck and possibly the trochanter, this would be consistent with pathologic fracture. 2. Degenerative changes of both hips. These results will be called to the ordering clinician or representative by the Radiologist Assistant, and communication documented in the PACS or [employer]. Electronically Signed   By: Jasmine Pang M.D.   On: 05/18/2023 15:42   DG Chest Port 1 View  Result Date: 05/10/2023 CLINICAL DATA:  Provided history: Hypoxia. EXAM: PORTABLE CHEST 1 VIEW COMPARISON:  Prior chest radiographs 05/09/2023 and earlier. Chest CT 03/31/2023. FINDINGS: Right chest infusion port catheter and right PleurX catheter, unchanged in position from the prior examination of 05/09/2023. There is persistent complete opacification of the right hemithorax. Extensive interstitial and nodular opacities throughout the left lung, also unchanged. No appreciable left pleural effusion. No evidence of pneumothorax. IMPRESSION: 1. No significant change from yesterday's chest radiograph. 2. Right chest infusion port catheter and right PleurX catheter. 3. Persistent complete opacification of  the right hemithorax. 4. Extensive interstitial and nodular opacities throughout the left lung. Electronically Signed   By: Jackey Loge D.O.   On: 05/10/2023 15:02   IR Radiologist Eval & Mgmt  Result Date: 05/10/2023 EXAM: NEW PATIENT OFFICE VISIT CHIEF COMPLAINT: See EMR HISTORY OF PRESENT ILLNESS: PleurX drainage catheter accessed using standard technique. of dark colored fluid removed at bedside. REVIEW OF SYSTEMS: See EMR PHYSICAL EXAMINATION: See EMR ASSESSMENT AND PLAN: See EMR Electronically Signed   By: Olive Bass M.D.   On: 05/10/2023 13:48   US Venous Img Lower Bilateral  Result Date: 05/09/2023 CLINICAL DATA:  New LOWER extremity swelling.  History of cancer. EXAM: BILATERAL LOWER EXTREMITY VENOUS DOPPLER ULTRASOUND TECHNIQUE: Gray-scale sonography with compression, as well as color and duplex ultrasound, were performed to evaluate the deep venous system(s) from the level of the common femoral vein through the popliteal and proximal calf veins. COMPARISON:  04/28/2023 FINDINGS: VENOUS Normal compressibility of the common femoral, superficial femoral, and popliteal veins, as well as the visualized calf veins. Visualized portions of profunda femoral vein and great saphenous vein unremarkable. No filling defects to suggest DVT on grayscale or color Doppler imaging. Doppler waveforms show normal direction of venous flow, normal respiratory plasticity and response to augmentation. Limited views of the contralateral common femoral vein are unremarkable. OTHER None. Limitations: none IMPRESSION: Negative. Electronically Signed   By: Norva Pavlov M.D.   On: 05/09/2023 18:51   DG Chest 2 View  Result Date: 05/09/2023 CLINICAL DATA:  Chest pain EXAM: CHEST - 2 VIEW COMPARISON:  05/02/2023 FINDINGS: Right chest port and right PleurX catheter are stable in positioning. Persistent complete opacification of the right hemithorax. Volume loss within the right hemithorax. Heart size is  obscured. Diffuse interstitial opacities throughout the left lung with nodularity. No left pleural effusion or pneumothorax. IMPRESSION: 1. Persistent complete opacification of the right hemithorax. 2. Diffuse interstitial opacities throughout the left lung with nodularity. No significant  interval change. Electronically Signed   By: Duanne Guess D.O.   On: 05/09/2023 17:38    ASSESSMENT: Stage IV adenocarcinoma of the right lung  PLAN:    Stage IV adenocarcinoma of the right lung: PET scan results from April 19, 2023 reviewed independently with widespread metastatic disease throughout lungs and bones.  MRI of the brain on April 18, 2023 revealed a 3 mm focus.  Molecular/PD-L1 testing pending at time of dictation.  Patient will benefit from carboplatinum, Taxol, and Avastin every 21 days for 6 cycles with Udenyca support.  He tolerated cycle 1 last week without significant side effects.  Labs reviewed and acceptable for treatment. However, given recent hip fracture and surgical repair, will hold avastin today and would hold x 28 days post operatively d/t increased risk of development of arterial and venous thromboses. Continue XRT for pain. Return to clinic in 3 weeks for further evaluation and consideration of cycle 3.  Plan to reimage with PET scan at the conclusion of cycle 4. wbc normal. May need to consider adding gcsf in the future however.  Hypoxia- d/t malignancy & opacification of right hemithorax. On home oxygen to maintain SpO2 > 92%.  Loss of appetite- on decadron 4 mg daily. Improved. D/t side effects of steroids, plan to reduce to 2 mg. Consider further taper.  Bony disease: Discussed role of zometa given his evidence of bone disease. He will see his dentist for dental clearance and start oral calcium and vitamin D. Plan to start zometa at next visit.    Left hip fracture- pathologic fracture of left hip after transfer from wheelchair to car. He is s/p arthroplasty at [facility] on 05/21/23. S/p  keflex x 7 days. Healing well.  Mucositis- start magic mouthwash. Encouraged hygiene with soft brush.  Pleural effusion: Patient now has Pleurx catheter. Headache/neck pain: Chronic and unchanged.  Continue fentanyl patch and oxycodone as needed.  Metastatic focus of brain: Only 3 mm.  Monitor and repeat brain MRI last week of July.   Hypotension: Patient will receive 1 L IV fluids today. Poor appetite: d/t malignancy. Will plan to taper off decadron. Start mirtazapine 7.5 mg at night for mood, sleep, and appetite. Can consider uptitration with monitoring ins etting of fentanyl.  Hyponatremia: Mild, monitor.  Patient sodium level is 134. IV fluids today.  Leukopenia: No leukocytosis. Likely related to steroid use. Afebrile. Monitor. He has not yet required udenyca but may be role for GCSF support in future.  Thrush: Patient was given a prescription for nystatin mouthwash. No improvement. Rotate to magic mouthwash. If ongoing symptoms, cover with oral diflucan.  Atrial fibrillation/flutter: Appreciate cardiology input. Followed by Dr Mariah Milling. Now on metoprolol 100 mg daily and anticoagulated with eliquis 5 mg BID. Continue statin. Recommend he reach out to Dr Mariah Milling with updates re: recent episodes of afib.  T2DM- on jardiance. Glucose elevated today. Likely related to steroids. Will decrease dose. Reluctant to restrict diet d/t weight loss. Appreicate dietary input. Could consider insulin during treatment.  Tobacco use- he has now quit smoking x 1 month. Applauded efforts. Recommend continued cessation.  History of diffuse large B-cell lymphoma: Patient completed cycle 6 of R-CHOP in November 2016 at [facility]  By report, PET scan in December 2016 revealed no evidence of disease. Pain d/t malignancy- On fentanyl for long acting pain control and Percocet for short acting.   Port-a-cath: placed on 05/02/23 for administration of chemotherapy. Functioning appropriately  Disposition:  Carbo-taxol today; hold  avastin. Hold zometa.  Add IVF today.  RTC as scheduled. Add possible zometa.   Patient expressed understanding and was in agreement with this plan. He also understands that He can call clinic at any time with any questions, concerns, or complaints.    Cancer Staging  Adenocarcinoma, lung, right [facility]) Staging form: Lung, AJCC 8th Edition - Clinical stage from 04/28/2023: Stage IVB (cTX, cNX, pM1c) - Signed by Jeralyn Ruths, MD on 04/28/2023 Stage prefix: Initial diagnosis  Alinda Dooms, NP   06/03/2023   CC: Dr. Orlie Dakin

## 2023-06-06 ENCOUNTER — Encounter: Payer: Self-pay | Admitting: Hospice and Palliative Medicine

## 2023-06-06 ENCOUNTER — Inpatient Hospital Stay (HOSPITAL_BASED_OUTPATIENT_CLINIC_OR_DEPARTMENT_OTHER): Payer: Managed Care, Other (non HMO) | Admitting: Hospice and Palliative Medicine

## 2023-06-06 ENCOUNTER — Inpatient Hospital Stay: Payer: Managed Care, Other (non HMO)

## 2023-06-06 ENCOUNTER — Encounter: Payer: Self-pay | Admitting: *Deleted

## 2023-06-06 ENCOUNTER — Ambulatory Visit: Payer: Managed Care, Other (non HMO)

## 2023-06-06 ENCOUNTER — Other Ambulatory Visit: Payer: Self-pay

## 2023-06-06 ENCOUNTER — Ambulatory Visit
Admission: RE | Admit: 2023-06-06 | Discharge: 2023-06-06 | Disposition: A | Discharge status: Home / Self Care | Payer: Managed Care, Other (non HMO) | Source: Ambulatory Visit | Attending: Radiation Oncology | Admitting: Radiation Oncology

## 2023-06-06 VITALS — BP 98/58 | HR 66

## 2023-06-06 VITALS — BP 95/76 | HR 92 | Temp 97.2°F | Resp 18

## 2023-06-06 DIAGNOSIS — R600 Localized edema: Secondary | ICD-10-CM | POA: Diagnosis not present

## 2023-06-06 DIAGNOSIS — E860 Dehydration: Secondary | ICD-10-CM

## 2023-06-06 DIAGNOSIS — C3491 Malignant neoplasm of unspecified part of right bronchus or lung: Secondary | ICD-10-CM

## 2023-06-06 DIAGNOSIS — Z515 Encounter for palliative care: Secondary | ICD-10-CM | POA: Diagnosis not present

## 2023-06-06 DIAGNOSIS — G893 Neoplasm related pain (acute) (chronic): Secondary | ICD-10-CM

## 2023-06-06 DIAGNOSIS — Z95828 Presence of other vascular implants and grafts: Secondary | ICD-10-CM

## 2023-06-06 DIAGNOSIS — Z510 Encounter for antineoplastic radiation therapy: Secondary | ICD-10-CM | POA: Diagnosis not present

## 2023-06-06 LAB — RAD ONC ARIA SESSION SUMMARY
Course Elapsed Days: 33
Plan Fractions Treated to Date: 1
Plan Prescribed Dose Per Fraction: 3 Gy
Plan Total Fractions Prescribed: 3
Plan Total Prescribed Dose: 9 Gy
Reference Point Dosage Given to Date: 24 Gy
Reference Point Session Dosage Given: 3 Gy
Session Number: 8

## 2023-06-06 MED ORDER — SODIUM CHLORIDE 0.9% FLUSH
10.0000 mL | Freq: Once | INTRAVENOUS | Status: AC
  Administered 2023-06-06: 10 mL via INTRAVENOUS
  Filled 2023-06-06: qty 10

## 2023-06-06 MED ORDER — PEGFILGRASTIM-CBQV 6 MG/0.6ML ~~LOC~~ SOSY
6.0000 mg | PREFILLED_SYRINGE | Freq: Once | SUBCUTANEOUS | Status: AC
  Administered 2023-06-06: 6 mg via SUBCUTANEOUS
  Filled 2023-06-06: qty 0.6

## 2023-06-06 MED ORDER — HEPARIN SOD (PORK) LOCK FLUSH 100 UNIT/ML IV SOLN
500.0000 [IU] | Freq: Once | INTRAVENOUS | Status: AC
  Administered 2023-06-06: 500 [IU]
  Filled 2023-06-06: qty 5

## 2023-06-06 MED ORDER — HEPARIN SOD (PORK) LOCK FLUSH 100 UNIT/ML IV SOLN
500.0000 [IU] | Freq: Once | INTRAVENOUS | Status: DC
  Filled 2023-06-06: qty 5

## 2023-06-06 MED ORDER — FLUCONAZOLE 100 MG PO TABS: 100.0000 mg | ORAL_TABLET | Freq: Every day | ORAL | 0 refills | Status: DC

## 2023-06-06 MED ORDER — SODIUM CHLORIDE 0.9 % IV SOLN
INTRAVENOUS | Status: DC
  Filled 2023-06-06 (×2): qty 250

## 2023-06-06 NOTE — Progress Notes (Signed)
Palliative Medicine [facility] at [facility] [phone]) [phone] [phone]   Name: Charles Hickman Date: 06/06/2023 MRN: [id number]  DOB: Sep 28, 1961  Patient Care Team: Dorothey Baseman, MD as PCP - General (Family Medicine) Mariah Milling, Tollie Pizza, MD as PCP - Cardiology (Cardiology) Glory Buff, RN as Oncology Nurse Navigator    REASON FOR CONSULTATION: Charles Hickman is a 62 y.o. 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  Patient also has malignant pleural effusion and is status post Pleurx catheter.  He was referred to palliative care to address goals and manage ongoing symptoms.  SOCIAL HISTORY:     reports that he quit smoking about 4 weeks ago. His smoking use included cigarettes. He smoked an average of .5 packs per day. He has never used smokeless tobacco. He reports current alcohol use. He reports that he does not use drugs.  Patient is married and lives at home with his wife.  He has a son who lives nearby.  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  ADVANCE DIRECTIVES:    CODE STATUS:   PAST MEDICAL HISTORY: Past Medical History:  Diagnosis Date   Abnormal EKG 11/15/2017   Abnormal stress test    Atrial fibrillation (HCC)    CAD in native artery    a. 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 No EKG changes concerning for ischemia at peak stress or in recovery. Mod to high risk scan; b. LHC 12/15/17: pLAD 75% w/ FFR 0.74 s/p PCI/DES, mRCA 90%, CTO dRCA w/ L-R colatts, EF 45-50%, inf HK    Diabetes mellitus (HCC)    HCV (hepatitis C virus)    HLD (hyperlipidemia)    Hypertension    Large cell lymphoma (HCC)    a. s/p 6 cycles of R-CHOP   Peripheral neuropathy    Stroke [facility])    Systolic dysfunction    a.  TTE 8/16: EF > 55%, aortic sclerosis, normal RV systolic function; b. LHC 12/15/17: EF 45-50%, inf HK    PAST SURGICAL HISTORY:  Past Surgical History:  Procedure Laterality Date   APPENDECTOMY     CORONARY PRESSURE/FFR STUDY N/A 12/15/2017   Procedure: INTRAVASCULAR PRESSURE WIRE/FFR STUDY;  Surgeon: Iran Ouch, MD;  Location: [facility] INVASIVE CV LAB;  Service: Cardiovascular;  Laterality: N/A;   IR GUIDED DRAIN W CATHETER PLACEMENT  05/02/2023   IR IMAGING GUIDED PORT INSERTION  05/02/2023   IR RADIOLOGIST EVAL & MGMT  05/10/2023   IR US GUIDE BX ASP/DRAIN  05/02/2023   LEFT HEART CATH AND CORONARY ANGIOGRAPHY N/A 12/15/2017   Procedure: LEFT HEART CATH AND CORONARY ANGIOGRAPHY;  Surgeon: Iran Ouch, MD;  Location: [facility] INVASIVE CV LAB;  Service: Cardiovascular;  Laterality: N/A;   TONSILLECTOMY AND ADENOIDECTOMY      HEMATOLOGY/ONCOLOGY HISTORY:  Oncology History  Adenocarcinoma, lung, right (HCC)  04/28/2023 Initial Diagnosis   Adenocarcinoma, lung, unspecified laterality (HCC)   04/28/2023 Cancer Staging   Staging form: Lung, AJCC 8th Edition - Clinical stage from 04/28/2023: Stage IVB (cTX, cNX, pM1c) - Signed by Jeralyn Ruths, MD on 04/28/2023 Stage prefix: Initial diagnosis   05/06/2023 -  Chemotherapy   Patient is on Treatment Plan : LUNG NSCLC Carboplatin + Paclitaxel + Bevacizumab q21d  ALLERGIES:  has No Known Allergies.  MEDICATIONS:  Current Outpatient Medications  Medication Sig Dispense Refill   apixaban (ELIQUIS) 5 MG TABS tablet TAKE 1 TABLET BY MOUTH TWICE A DAY 180 tablet 1   blood glucose meter kit and supplies KIT Dispense based on patient and insurance preference. Use 2 times daily as directed. (FOR ICD-10 E11.9). (Patient not taking: Reported on 05/09/2023) 1 each 0   calcium carbonate (CALCIUM 600) 600 MG TABS tablet Take 2 tablets (1,200 mg total) by mouth daily. 90 tablet 2   cholecalciferol (VITAMIN D3) 25 MCG (1000 UNIT) tablet Take 1  tablet (1,000 Units total) by mouth daily. 90 tablet 1   dexamethasone (DECADRON) 2 MG tablet Take 1 tablet (2 mg total) by mouth daily. 30 tablet 1   diltiazem (CARDIZEM) 30 MG tablet Take 1 tablet (30 mg total) by mouth daily as needed (for fast heart rate). 30 tablet 11   fentaNYL (DURAGESIC) 25 MCG/HR Place 1 patch onto the skin every 3 (three) days. 10 patch 0   JARDIANCE 25 MG TABS tablet TAKE 1 TABLET BY MOUTH EVERY DAY 90 tablet 2   lidocaine-prilocaine (EMLA) cream Apply to affected area once 30 g 3   metoprolol succinate (TOPROL-XL) 100 MG 24 hr tablet Take 1 tablet (100 mg total) by mouth daily. TAKE WITH OR IMMEDIATELY FOLLOWING A MEAL. 90 tablet 1   mirtazapine (REMERON) 7.5 MG tablet Take 1 tablet (7.5 mg total) by mouth at bedtime. For mood, appetite, and sleep 30 tablet 1   Multiple Vitamin (MULTI-VITAMIN) tablet Take 1 tablet by mouth daily.     naloxone (NARCAN) nasal spray 4 mg/0.1 mL See admin instructions.     nitroGLYCERIN (NITROSTAT) 0.4 MG SL tablet Place 1 tablet (0.4 mg total) under the tongue every 5 (five) minutes as needed for chest pain. 25 tablet 3   nystatin (MYCOSTATIN) 100000 UNIT/ML suspension Take 5 mLs (500,000 Units total) by mouth 4 (four) times daily. Swish and spit. 473 mL 0   ondansetron (ZOFRAN) 8 MG tablet Take 1 tablet (8 mg total) by mouth every 8 (eight) hours as needed for nausea or vomiting. Start on the third day after carboplatin. 60 tablet 2   ONETOUCH VERIO test strip USE TWICE A DAY AS DIRECTED (Patient not taking: Reported on 05/09/2023) 100 strip 0   oxyCODONE (OXY IR/ROXICODONE) 5 MG immediate release tablet Take 1 tablet (5 mg total) by mouth every 6 (six) hours as needed for severe pain. 60 tablet 0   prochlorperazine (COMPAZINE) 10 MG tablet Take 1 tablet (10 mg total) by mouth every 6 (six) hours as needed for nausea or vomiting. 60 tablet 2   rosuvastatin (CRESTOR) 10 MG tablet TAKE 1 TABLET BY MOUTH EVERY DAY 90 tablet 0   senna-docusate  (SENOKOT-S) 8.6-50 MG tablet Take 2 tablets by mouth daily.     No current facility-administered medications for this visit.    VITAL SIGNS: There were no vitals taken for this visit. There were no vitals filed for this visit.  Estimated body mass index is 21.24 kg/m as calculated from the following:   Height as of 05/18/23: 5\' 10"  (1.778 m).   Weight as of 06/03/23: 148 lb (67.1 kg).  LABS: CBC:    Component Value Date/Time   WBC 12.5 (H) 06/03/2023 1028   WBC 2.0 (L) 05/18/2023 1729   HGB 13.5 06/03/2023 1028   HGB 17.4 12/11/2020 1340   HCT 41.0 06/03/2023 1028   HCT 49.7 12/11/2020  1340   PLT 214 06/03/2023 1028   PLT 200 12/11/2020 1340   MCV 84.9 06/03/2023 1028   MCV 88 12/11/2020 1340   MCV 88 10/25/2013 0247   NEUTROABS 10.2 (H) 06/03/2023 1028   NEUTROABS 3.6 12/07/2017 0839   LYMPHSABS 0.9 06/03/2023 1028   LYMPHSABS 1.9 12/07/2017 0839   MONOABS 1.1 (H) 06/03/2023 1028   EOSABS 0.0 06/03/2023 1028   EOSABS 0.1 12/07/2017 0839   BASOSABS 0.1 06/03/2023 1028   BASOSABS 0.0 12/07/2017 0839   Comprehensive Metabolic Panel:    Component Value Date/Time   NA 131 (L) 06/03/2023 1026   NA 140 12/11/2020 1340   NA 138 10/25/2013 0247   K 4.0 06/03/2023 1026   K 4.1 10/25/2013 0247   CL 97 (L) 06/03/2023 1026   CL 106 10/25/2013 0247   CO2 25 06/03/2023 1026   CO2 27 10/25/2013 0247   BUN 20 06/03/2023 1026   BUN 15 12/11/2020 1340   BUN 20 (H) 10/25/2013 0247   CREATININE 0.69 06/03/2023 1026   CREATININE 1.41 (H) 10/25/2013 0247   GLUCOSE 228 (H) 06/03/2023 1026   GLUCOSE 182 (H) 10/25/2013 0247   CALCIUM 8.1 (L) 06/03/2023 1026   CALCIUM 9.2 10/25/2013 0247   AST 21 06/03/2023 1026   ALT 29 06/03/2023 1026   ALT 40 10/25/2013 0247   ALKPHOS 142 (H) 06/03/2023 1026   ALKPHOS 82 10/25/2013 0247   BILITOT 0.8 06/03/2023 1026   PROT 5.7 (L) 06/03/2023 1026   PROT 6.5 12/07/2017 0839   PROT 7.3 10/25/2013 0247   ALBUMIN 2.8 (L) 06/03/2023 1026    ALBUMIN 4.5 12/07/2017 0839   ALBUMIN 4.2 10/25/2013 0247    RADIOGRAPHIC STUDIES: DG Chest Portable 1 View  Result Date: 05/18/2023 CLINICAL DATA:  Preop evaluation for hip surgery EXAM: PORTABLE CHEST 1 VIEW COMPARISON:  05/10/2023 FINDINGS: There is again noted complete opacification of the right hemithorax identified. PleurX catheter is noted in place. Right chest wall port is again seen. Stable interstitial opacities are noted throughout the left lung. No new focal abnormality is seen. IMPRESSION: Complete opacification of the right hemithorax similar to that seen on the prior exam. Diffuse stable interstitial changes are noted throughout the left lung. Electronically Signed   By: Alcide Clever M.D.   On: 05/18/2023 17:45   DG HIP UNILAT WITH PELVIS 2-3 VIEWS LEFT  Result Date: 05/18/2023 CLINICAL DATA:  Left-sided hip pain EXAM: DG HIP (WITH OR WITHOUT PELVIS) 2-3V LEFT COMPARISON:  PET CT 04/19/2023 FINDINGS: SI joints are non widened. Pubic symphysis and rami appear intact. Degenerative changes of both hips. Acute left femoral neck fracture, question lucency extending to the trochanter on frontal view of the pelvis. Underlying heterogenous mineralization corresponding to the lesion seen on PET CT previously. IMPRESSION: 1. Acute appearing proximal left femur fracture, involves femoral neck and possibly the trochanter, this would be consistent with pathologic fracture. 2. Degenerative changes of both hips. These results will be called to the ordering clinician or representative by the Radiologist Assistant, and communication documented in the PACS or [employer]. Electronically Signed   By: Jasmine Pang M.D.   On: 05/18/2023 15:42   DG Chest Port 1 View  Result Date: 05/10/2023 CLINICAL DATA:  Provided history: Hypoxia. EXAM: PORTABLE CHEST 1 VIEW COMPARISON:  Prior chest radiographs 05/09/2023 and earlier. Chest CT 03/31/2023. FINDINGS: Right chest infusion port catheter and right PleurX  catheter, unchanged in position from the prior examination of 05/09/2023. There is persistent  complete opacification of the right hemithorax. Extensive interstitial and nodular opacities throughout the left lung, also unchanged. No appreciable left pleural effusion. No evidence of pneumothorax. IMPRESSION: 1. No significant change from yesterday's chest radiograph. 2. Right chest infusion port catheter and right PleurX catheter. 3. Persistent complete opacification of the right hemithorax. 4. Extensive interstitial and nodular opacities throughout the left lung. Electronically Signed   By: Jackey Loge D.O.   On: 05/10/2023 15:02   IR Radiologist Eval & Mgmt  Result Date: 05/10/2023 EXAM: NEW PATIENT OFFICE VISIT CHIEF COMPLAINT: See EMR HISTORY OF PRESENT ILLNESS: PleurX drainage catheter accessed using standard technique. of dark colored fluid removed at bedside. REVIEW OF SYSTEMS: See EMR PHYSICAL EXAMINATION: See EMR ASSESSMENT AND PLAN: See EMR Electronically Signed   By: Olive Bass M.D.   On: 05/10/2023 13:48   US Venous Img Lower Bilateral  Result Date: 05/09/2023 CLINICAL DATA:  New LOWER extremity swelling.  History of cancer. EXAM: BILATERAL LOWER EXTREMITY VENOUS DOPPLER ULTRASOUND TECHNIQUE: Gray-scale sonography with compression, as well as color and duplex ultrasound, were performed to evaluate the deep venous system(s) from the level of the common femoral vein through the popliteal and proximal calf veins. COMPARISON:  04/28/2023 FINDINGS: VENOUS Normal compressibility of the common femoral, superficial femoral, and popliteal veins, as well as the visualized calf veins. Visualized portions of profunda femoral vein and great saphenous vein unremarkable. No filling defects to suggest DVT on grayscale or color Doppler imaging. Doppler waveforms show normal direction of venous flow, normal respiratory plasticity and response to augmentation. Limited views of the contralateral common  femoral vein are unremarkable. OTHER None. Limitations: none IMPRESSION: Negative. Electronically Signed   By: Norva Pavlov M.D.   On: 05/09/2023 18:51   DG Chest 2 View  Result Date: 05/09/2023 CLINICAL DATA:  Chest pain EXAM: CHEST - 2 VIEW COMPARISON:  05/02/2023 FINDINGS: Right chest port and right PleurX catheter are stable in positioning. Persistent complete opacification of the right hemithorax. Volume loss within the right hemithorax. Heart size is obscured. Diffuse interstitial opacities throughout the left lung with nodularity. No left pleural effusion or pneumothorax. IMPRESSION: 1. Persistent complete opacification of the right hemithorax. 2. Diffuse interstitial opacities throughout the left lung with nodularity. No significant interval change. Electronically Signed   By: Duanne Guess D.O.   On: 05/09/2023 17:38    PERFORMANCE STATUS (ECOG) : 1 - Symptomatic but completely ambulatory  Review of Systems Unless otherwise noted, a complete review of systems is negative.  Physical Exam General: NAD HEENT: Thrush Pulmonary: Clear to auscultation anterior fields, Pleurx Cardiac: Regular Abdomen, soft nontender bowel Extremities: no edema, no joint deformities Skin: no rashes Neurological: Weakness but otherwise nonfocal  IMPRESSION: I spoke with patient earlier by phone.  However, he presented to clinic for XRT to drain his Pleurx and requested to be seen for evaluation of mucositis, left upper extremity edema, sacral pressure ulcer.  Patient reports general oral discomfort with eating/drinking.  He has been on Magic mouthwash.  Does appear to have thrush today.  Will start on Diflucan.  Patient endorses 2 days of left upper extremity swelling or pain.  He questions if this is related to musculoskeletal injury from having to lift himself in bed.  He feels like swelling that is slightly improved today.  Will plan to send for ultrasound to rule out DVT.  Patient has stage I  sacral pressure ulcer.  Discussed pressure offloading techniques.  Recommend zinc oxide and Mepilex.  Oral intake poor.  Will proceed with IV fluids today.  Patient would benefit from weekly supportive care.  Reviewed MOST form which patient took home to discuss with family.   PLAN: -Continue current scope of treatment -Continue fentanyl/oxycodone -Daily bowel regimen -Add Mepilex to sacral wound -Start diflucan -IVFs today -MOST form given -[place] LUE -Follow up weekly for supportive care    Patient expressed understanding and was in agreement with this plan. He also understands that He can call the clinic at any time with any questions, concerns, or complaints.     Time Total: 20 minutes  Visit consisted of counseling and education dealing with the complex and emotionally intense issues of symptom management and palliative care in the setting of serious and potentially life-threatening illness.Greater than 50%  of this time was spent counseling and coordinating care related to the above assessment and plan.  Signed by: Laurette Schimke, PhD, NP-C

## 2023-06-06 NOTE — Addendum Note (Signed)
Addended by: Malachy Moan on: 06/06/2023 03:37 PM   Modules accepted: Level of Service

## 2023-06-06 NOTE — Progress Notes (Signed)
Met with patient during visit for injection today. Pt requested help with draining pleurx catheter. Successful drainage of 300 mL amber colored fluid removed. New dressing applied. Pt tolerated drainage well. Pt states that he has not been feeling well since chemo. Unable to eat and drink due to mouth soreness. Pt has been using magic mouthwash but states it does not help. Spoke with Josh who advised to see pt as add on today for possible IVF. Pt made aware. Nothing further needed at this time.

## 2023-06-06 NOTE — Progress Notes (Signed)
Virtual Visit via Telephone Note  I connected with Charles Hickman on 06/06/23 at  1:00 PM EDT by telephone and verified that I am speaking with the correct person using two identifiers.  Location: Patient: Home Provider: Clinic   I discussed the limitations, risks, security and privacy concerns of performing an evaluation and management service by telephone and the availability of in person appointments. I also discussed with the patient that there may be a patient responsible charge related to this service. The patient expressed understanding and agreed to proceed.   History of Present Illness: Charles Hickman is a 62 y.o. 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  Patient also has malignant pleural effusion and is status post Pleurx catheter.  He was referred to palliative care to address goals and manage ongoing symptoms.    Observations/Objective: I called and spoke with patient by phone.  He saw Consuello Masse, NP last week and was started on mirtazapine with dose reduction of dexamethasone given persistent insomnia.  Today, patient says that he has not yet picked those up from the pharmacy.  We discussed the potential benefit of mirtazapine and helping with insomnia and appetite.  Patient plans to start it when able.  He says that his pain is reasonably well-controlled on current regimen of fentanyl.  He is trying to limit his use of oxycodone as he was concerned that it could be contributing to A-fib.   Assessment and Plan: Stage IV lung cancer -on treatment CarboTaxol plus Bev  Neoplasm related pain -continue fentanyl/oxycodone  Insomnia -agree with trial of mirtazapine  Follow Up Instructions: Follow-up telephone visit 1 month   I discussed the assessment and treatment plan with the patient. The patient was provided an opportunity to ask questions and all were answered. The patient agreed with the plan  and demonstrated an understanding of the instructions.   The patient was advised to call back or seek an in-person evaluation if the symptoms worsen or if the condition fails to improve as anticipated.  I provided 5 minutes of non-face-to-face time during this encounter.   Malachy Moan, NP

## 2023-06-07 ENCOUNTER — Ambulatory Visit: Payer: Managed Care, Other (non HMO)

## 2023-06-07 ENCOUNTER — Ambulatory Visit
Admission: RE | Admit: 2023-06-07 | Discharge: 2023-06-07 | Disposition: A | Discharge status: Home / Self Care | Payer: Managed Care, Other (non HMO) | Source: Ambulatory Visit | Attending: Hospice and Palliative Medicine | Admitting: Hospice and Palliative Medicine

## 2023-06-07 ENCOUNTER — Other Ambulatory Visit: Payer: Self-pay

## 2023-06-07 ENCOUNTER — Ambulatory Visit
Admission: RE | Admit: 2023-06-07 | Discharge: 2023-06-07 | Disposition: A | Discharge status: Home / Self Care | Payer: Managed Care, Other (non HMO) | Source: Ambulatory Visit | Attending: Radiation Oncology | Admitting: Radiation Oncology

## 2023-06-07 ENCOUNTER — Encounter: Payer: Self-pay | Admitting: Oncology

## 2023-06-07 DIAGNOSIS — C3491 Malignant neoplasm of unspecified part of right bronchus or lung: Secondary | ICD-10-CM | POA: Insufficient documentation

## 2023-06-07 DIAGNOSIS — R600 Localized edema: Secondary | ICD-10-CM | POA: Diagnosis present

## 2023-06-07 DIAGNOSIS — Z510 Encounter for antineoplastic radiation therapy: Secondary | ICD-10-CM | POA: Diagnosis not present

## 2023-06-07 LAB — RAD ONC ARIA SESSION SUMMARY
Course Elapsed Days: 34
Plan Fractions Treated to Date: 2
Plan Prescribed Dose Per Fraction: 3 Gy
Plan Total Fractions Prescribed: 3
Plan Total Prescribed Dose: 9 Gy
Reference Point Dosage Given to Date: 27 Gy
Reference Point Session Dosage Given: 3 Gy
Session Number: 9

## 2023-06-08 ENCOUNTER — Other Ambulatory Visit: Payer: Self-pay

## 2023-06-08 ENCOUNTER — Ambulatory Visit: Payer: Managed Care, Other (non HMO)

## 2023-06-08 ENCOUNTER — Ambulatory Visit
Admission: RE | Admit: 2023-06-08 | Discharge: 2023-06-08 | Disposition: A | Discharge status: Home / Self Care | Payer: Managed Care, Other (non HMO) | Source: Ambulatory Visit | Attending: Radiation Oncology | Admitting: Radiation Oncology

## 2023-06-08 DIAGNOSIS — Z510 Encounter for antineoplastic radiation therapy: Secondary | ICD-10-CM | POA: Diagnosis not present

## 2023-06-08 LAB — RAD ONC ARIA SESSION SUMMARY
Course Elapsed Days: 35
Plan Fractions Treated to Date: 3
Plan Prescribed Dose Per Fraction: 3 Gy
Plan Total Fractions Prescribed: 3
Plan Total Prescribed Dose: 9 Gy
Reference Point Dosage Given to Date: 30 Gy
Reference Point Session Dosage Given: 3 Gy
Session Number: 10

## 2023-06-09 ENCOUNTER — Ambulatory Visit: Admission: RE | Admit: 2023-06-09 | Payer: Managed Care, Other (non HMO) | Source: Ambulatory Visit

## 2023-06-09 ENCOUNTER — Ambulatory Visit: Payer: Managed Care, Other (non HMO)

## 2023-06-10 ENCOUNTER — Ambulatory Visit: Payer: Managed Care, Other (non HMO)

## 2023-06-13 ENCOUNTER — Ambulatory Visit: Payer: Managed Care, Other (non HMO)

## 2023-06-13 ENCOUNTER — Inpatient Hospital Stay: Payer: Managed Care, Other (non HMO)

## 2023-06-13 ENCOUNTER — Emergency Department: Payer: Managed Care, Other (non HMO)

## 2023-06-13 ENCOUNTER — Other Ambulatory Visit: Payer: Self-pay

## 2023-06-13 ENCOUNTER — Telehealth: Payer: Self-pay | Admitting: *Deleted

## 2023-06-13 ENCOUNTER — Inpatient Hospital Stay
Admission: EM | Admit: 2023-06-13 | Discharge: 2023-06-21 | DRG: 871 | Disposition: A | Discharge status: Hospice | Payer: Managed Care, Other (non HMO) | Attending: Internal Medicine | Admitting: Internal Medicine

## 2023-06-13 ENCOUNTER — Encounter: Payer: Self-pay | Admitting: Emergency Medicine

## 2023-06-13 DIAGNOSIS — Z83438 Family history of other disorder of lipoprotein metabolism and other lipidemia: Secondary | ICD-10-CM

## 2023-06-13 DIAGNOSIS — Z923 Personal history of irradiation: Secondary | ICD-10-CM

## 2023-06-13 DIAGNOSIS — R6520 Severe sepsis without septic shock: Secondary | ICD-10-CM | POA: Diagnosis present

## 2023-06-13 DIAGNOSIS — I272 Pulmonary hypertension, unspecified: Secondary | ICD-10-CM | POA: Diagnosis present

## 2023-06-13 DIAGNOSIS — E871 Hypo-osmolality and hyponatremia: Secondary | ICD-10-CM | POA: Diagnosis present

## 2023-06-13 DIAGNOSIS — I5022 Chronic systolic (congestive) heart failure: Secondary | ICD-10-CM | POA: Diagnosis present

## 2023-06-13 DIAGNOSIS — J910 Malignant pleural effusion: Secondary | ICD-10-CM | POA: Diagnosis present

## 2023-06-13 DIAGNOSIS — I2510 Atherosclerotic heart disease of native coronary artery without angina pectoris: Secondary | ICD-10-CM | POA: Diagnosis not present

## 2023-06-13 DIAGNOSIS — E872 Acidosis, unspecified: Secondary | ICD-10-CM | POA: Diagnosis present

## 2023-06-13 DIAGNOSIS — Z79891 Long term (current) use of opiate analgesic: Secondary | ICD-10-CM

## 2023-06-13 DIAGNOSIS — I480 Paroxysmal atrial fibrillation: Secondary | ICD-10-CM | POA: Diagnosis not present

## 2023-06-13 DIAGNOSIS — R0602 Shortness of breath: Secondary | ICD-10-CM | POA: Diagnosis not present

## 2023-06-13 DIAGNOSIS — I252 Old myocardial infarction: Secondary | ICD-10-CM

## 2023-06-13 DIAGNOSIS — E1142 Type 2 diabetes mellitus with diabetic polyneuropathy: Secondary | ICD-10-CM | POA: Diagnosis present

## 2023-06-13 DIAGNOSIS — E119 Type 2 diabetes mellitus without complications: Secondary | ICD-10-CM

## 2023-06-13 DIAGNOSIS — Z6821 Body mass index (BMI) 21.0-21.9, adult: Secondary | ICD-10-CM

## 2023-06-13 DIAGNOSIS — Z1152 Encounter for screening for COVID-19: Secondary | ICD-10-CM | POA: Diagnosis not present

## 2023-06-13 DIAGNOSIS — D638 Anemia in other chronic diseases classified elsewhere: Secondary | ICD-10-CM | POA: Diagnosis present

## 2023-06-13 DIAGNOSIS — J9621 Acute and chronic respiratory failure with hypoxia: Secondary | ICD-10-CM | POA: Diagnosis not present

## 2023-06-13 DIAGNOSIS — D6959 Other secondary thrombocytopenia: Secondary | ICD-10-CM | POA: Diagnosis present

## 2023-06-13 DIAGNOSIS — Z8673 Personal history of transient ischemic attack (TIA), and cerebral infarction without residual deficits: Secondary | ICD-10-CM

## 2023-06-13 DIAGNOSIS — E1159 Type 2 diabetes mellitus with other circulatory complications: Secondary | ICD-10-CM | POA: Diagnosis not present

## 2023-06-13 DIAGNOSIS — I4891 Unspecified atrial fibrillation: Secondary | ICD-10-CM | POA: Diagnosis not present

## 2023-06-13 DIAGNOSIS — I2489 Other forms of acute ischemic heart disease: Secondary | ICD-10-CM | POA: Diagnosis present

## 2023-06-13 DIAGNOSIS — E43 Unspecified severe protein-calorie malnutrition: Secondary | ICD-10-CM | POA: Diagnosis present

## 2023-06-13 DIAGNOSIS — B1920 Unspecified viral hepatitis C without hepatic coma: Secondary | ICD-10-CM | POA: Diagnosis present

## 2023-06-13 DIAGNOSIS — Z955 Presence of coronary angioplasty implant and graft: Secondary | ICD-10-CM

## 2023-06-13 DIAGNOSIS — R778 Other specified abnormalities of plasma proteins: Secondary | ICD-10-CM | POA: Diagnosis not present

## 2023-06-13 DIAGNOSIS — Z833 Family history of diabetes mellitus: Secondary | ICD-10-CM

## 2023-06-13 DIAGNOSIS — J90 Pleural effusion, not elsewhere classified: Secondary | ICD-10-CM | POA: Diagnosis not present

## 2023-06-13 DIAGNOSIS — I700 Atherosclerosis of aorta: Secondary | ICD-10-CM | POA: Diagnosis present

## 2023-06-13 DIAGNOSIS — E860 Dehydration: Secondary | ICD-10-CM | POA: Diagnosis present

## 2023-06-13 DIAGNOSIS — C779 Secondary and unspecified malignant neoplasm of lymph node, unspecified: Secondary | ICD-10-CM | POA: Diagnosis present

## 2023-06-13 DIAGNOSIS — J9601 Acute respiratory failure with hypoxia: Secondary | ICD-10-CM | POA: Diagnosis present

## 2023-06-13 DIAGNOSIS — J440 Chronic obstructive pulmonary disease with acute lower respiratory infection: Secondary | ICD-10-CM | POA: Diagnosis present

## 2023-06-13 DIAGNOSIS — A419 Sepsis, unspecified organism: Principal | ICD-10-CM | POA: Diagnosis present

## 2023-06-13 DIAGNOSIS — J9811 Atelectasis: Secondary | ICD-10-CM | POA: Diagnosis present

## 2023-06-13 DIAGNOSIS — E785 Hyperlipidemia, unspecified: Secondary | ICD-10-CM | POA: Diagnosis present

## 2023-06-13 DIAGNOSIS — Z8249 Family history of ischemic heart disease and other diseases of the circulatory system: Secondary | ICD-10-CM

## 2023-06-13 DIAGNOSIS — C7951 Secondary malignant neoplasm of bone: Secondary | ICD-10-CM | POA: Diagnosis present

## 2023-06-13 DIAGNOSIS — I110 Hypertensive heart disease with heart failure: Secondary | ICD-10-CM | POA: Diagnosis present

## 2023-06-13 DIAGNOSIS — Z823 Family history of stroke: Secondary | ICD-10-CM

## 2023-06-13 DIAGNOSIS — J189 Pneumonia, unspecified organism: Secondary | ICD-10-CM | POA: Diagnosis present

## 2023-06-13 DIAGNOSIS — F17219 Nicotine dependence, cigarettes, with unspecified nicotine-induced disorders: Secondary | ICD-10-CM | POA: Diagnosis not present

## 2023-06-13 DIAGNOSIS — Z79899 Other long term (current) drug therapy: Secondary | ICD-10-CM

## 2023-06-13 DIAGNOSIS — Z515 Encounter for palliative care: Secondary | ICD-10-CM

## 2023-06-13 DIAGNOSIS — I4819 Other persistent atrial fibrillation: Secondary | ICD-10-CM | POA: Diagnosis present

## 2023-06-13 DIAGNOSIS — Z7984 Long term (current) use of oral hypoglycemic drugs: Secondary | ICD-10-CM

## 2023-06-13 DIAGNOSIS — Z7901 Long term (current) use of anticoagulants: Secondary | ICD-10-CM

## 2023-06-13 DIAGNOSIS — I10 Essential (primary) hypertension: Secondary | ICD-10-CM | POA: Diagnosis not present

## 2023-06-13 DIAGNOSIS — T461X5A Adverse effect of calcium-channel blockers, initial encounter: Secondary | ICD-10-CM | POA: Diagnosis not present

## 2023-06-13 DIAGNOSIS — C3491 Malignant neoplasm of unspecified part of right bronchus or lung: Secondary | ICD-10-CM | POA: Diagnosis present

## 2023-06-13 DIAGNOSIS — R5381 Other malaise: Secondary | ICD-10-CM | POA: Diagnosis present

## 2023-06-13 DIAGNOSIS — Z87891 Personal history of nicotine dependence: Secondary | ICD-10-CM

## 2023-06-13 DIAGNOSIS — Z8572 Personal history of non-Hodgkin lymphomas: Secondary | ICD-10-CM

## 2023-06-13 DIAGNOSIS — G893 Neoplasm related pain (acute) (chronic): Secondary | ICD-10-CM | POA: Diagnosis present

## 2023-06-13 DIAGNOSIS — D6481 Anemia due to antineoplastic chemotherapy: Secondary | ICD-10-CM | POA: Diagnosis present

## 2023-06-13 DIAGNOSIS — C3490 Malignant neoplasm of unspecified part of unspecified bronchus or lung: Secondary | ICD-10-CM | POA: Diagnosis not present

## 2023-06-13 DIAGNOSIS — Z9981 Dependence on supplemental oxygen: Secondary | ICD-10-CM

## 2023-06-13 DIAGNOSIS — T451X5A Adverse effect of antineoplastic and immunosuppressive drugs, initial encounter: Secondary | ICD-10-CM | POA: Diagnosis present

## 2023-06-13 DIAGNOSIS — Z841 Family history of disorders of kidney and ureter: Secondary | ICD-10-CM

## 2023-06-13 DIAGNOSIS — I952 Hypotension due to drugs: Secondary | ICD-10-CM | POA: Diagnosis not present

## 2023-06-13 LAB — URINALYSIS, ROUTINE W REFLEX MICROSCOPIC
Bilirubin Urine: NEGATIVE
Glucose, UA: >=500 mg/dL — AB
Hgb urine dipstick: NEGATIVE
Ketones, ur: 20 mg/dL — AB
Leukocytes,Ua: NEGATIVE
Nitrite: NEGATIVE
Protein, ur: NEGATIVE mg/dL
Specific Gravity, Urine: 1.025 (ref 1.005–1.030)
pH: 5 (ref 5.0–8.0)

## 2023-06-13 LAB — CBC WITH DIFFERENTIAL/PLATELET
Abs Immature Granulocytes: 0.91 10*3/uL — ABNORMAL HIGH (ref 0.00–0.07)
Basophils Absolute: 0 10*3/uL (ref 0.0–0.1)
Basophils Relative: 0 %
Eosinophils Absolute: 0 10*3/uL (ref 0.0–0.5)
Eosinophils Relative: 0 %
HCT: 39.8 % (ref 39.0–52.0)
Hemoglobin: 12.8 g/dL — ABNORMAL LOW (ref 13.0–17.0)
Immature Granulocytes: 9 %
Lymphocytes Relative: 3 %
Lymphs Abs: 0.3 10*3/uL — ABNORMAL LOW (ref 0.7–4.0)
MCH: 27.8 pg (ref 26.0–34.0)
MCHC: 32.2 g/dL (ref 30.0–36.0)
MCV: 86.3 fL (ref 80.0–100.0)
Monocytes Absolute: 0.9 10*3/uL (ref 0.1–1.0)
Monocytes Relative: 9 %
Neutro Abs: 8.4 10*3/uL — ABNORMAL HIGH (ref 1.7–7.7)
Neutrophils Relative %: 79 %
Platelets: 87 10*3/uL — ABNORMAL LOW (ref 150–400)
RBC: 4.61 MIL/uL (ref 4.22–5.81)
RDW: 19.3 % — ABNORMAL HIGH (ref 11.5–15.5)
Smear Review: NORMAL
WBC: 10.6 10*3/uL — ABNORMAL HIGH (ref 4.0–10.5)
nRBC: 0 % (ref 0.0–0.2)

## 2023-06-13 LAB — RESP PANEL BY RT-PCR (RSV, FLU A&B, COVID)  RVPGX2
Influenza A by PCR: NEGATIVE
Influenza B by PCR: NEGATIVE
Resp Syncytial Virus by PCR: NEGATIVE
SARS Coronavirus 2 by RT PCR: NEGATIVE

## 2023-06-13 LAB — COMPREHENSIVE METABOLIC PANEL
ALT: 16 U/L (ref 0–44)
AST: 19 U/L (ref 15–41)
Albumin: 2.5 g/dL — ABNORMAL LOW (ref 3.5–5.0)
Alkaline Phosphatase: 123 U/L (ref 38–126)
Anion gap: 18 — ABNORMAL HIGH (ref 5–15)
BUN: 39 mg/dL — ABNORMAL HIGH (ref 8–23)
CO2: 16 mmol/L — ABNORMAL LOW (ref 22–32)
Calcium: 8 mg/dL — ABNORMAL LOW (ref 8.9–10.3)
Chloride: 99 mmol/L (ref 98–111)
Creatinine, Ser: 0.83 mg/dL (ref 0.61–1.24)
GFR, Estimated: >60 mL/min (ref >60)
Glucose, Bld: 203 mg/dL — ABNORMAL HIGH (ref 70–99)
Potassium: 4.3 mmol/L (ref 3.5–5.1)
Sodium: 133 mmol/L — ABNORMAL LOW (ref 135–145)
Total Bilirubin: 1.5 mg/dL — ABNORMAL HIGH (ref 0.3–1.2)
Total Protein: 5.3 g/dL — ABNORMAL LOW (ref 6.5–8.1)

## 2023-06-13 LAB — PROTIME-INR
INR: 2.5 — ABNORMAL HIGH (ref 0.8–1.2)
Prothrombin Time: 27.5 seconds — ABNORMAL HIGH (ref 11.4–15.2)

## 2023-06-13 LAB — LACTIC ACID, PLASMA
Lactic Acid, Venous: 3.7 mmol/L (ref 0.5–1.9)
Lactic Acid, Venous: 3.1 mmol/L (ref 0.5–1.9)

## 2023-06-13 LAB — BRAIN NATRIURETIC PEPTIDE: B Natriuretic Peptide: 586.1 pg/mL — ABNORMAL HIGH (ref 0.0–100.0)

## 2023-06-13 MED ORDER — ALBUTEROL SULFATE (2.5 MG/3ML) 0.083% IN NEBU
2.5000 mg | INHALATION_SOLUTION | Freq: Once | RESPIRATORY_TRACT | Status: AC
  Administered 2023-06-13: 2.5 mg via RESPIRATORY_TRACT
  Filled 2023-06-13: qty 3

## 2023-06-13 MED ORDER — SODIUM CHLORIDE 0.9 % IV SOLN: 500.0000 mg | INTRAVENOUS | Status: DC

## 2023-06-13 MED ORDER — SODIUM CHLORIDE 0.9 % IV SOLN
2.0000 g | Freq: Once | INTRAVENOUS | Status: AC
  Administered 2023-06-13: 2 g via INTRAVENOUS
  Filled 2023-06-13: qty 12.5

## 2023-06-13 MED ORDER — MORPHINE SULFATE (PF) 2 MG/ML IV SOLN
2.0000 mg | INTRAVENOUS | Status: DC | PRN
  Administered 2023-06-14 – 2023-06-21 (×18): 2 mg via INTRAVENOUS
  Filled 2023-06-13 (×19): qty 1

## 2023-06-13 MED ORDER — SODIUM CHLORIDE 0.9 % IV BOLUS
1000.0000 mL | Freq: Once | INTRAVENOUS | Status: AC
  Administered 2023-06-13: 1000 mL via INTRAVENOUS

## 2023-06-13 MED ORDER — APIXABAN 5 MG PO TABS
5.0000 mg | ORAL_TABLET | Freq: Two times a day (BID) | ORAL | Status: DC
  Administered 2023-06-13 – 2023-06-21 (×16): 5 mg via ORAL
  Filled 2023-06-13 (×16): qty 1

## 2023-06-13 MED ORDER — ACETAMINOPHEN 325 MG PO TABS
650.0000 mg | ORAL_TABLET | Freq: Four times a day (QID) | ORAL | Status: DC | PRN
  Administered 2023-06-16 (×2): 650 mg via ORAL
  Filled 2023-06-13 (×2): qty 2

## 2023-06-13 MED ORDER — SODIUM CHLORIDE 0.9 % IV SOLN
2.0000 g | Freq: Three times a day (TID) | INTRAVENOUS | Status: AC
  Administered 2023-06-14 – 2023-06-16 (×9): 2 g via INTRAVENOUS
  Filled 2023-06-13 (×10): qty 12.5

## 2023-06-13 MED ORDER — VANCOMYCIN HCL IN DEXTROSE 1-5 GM/200ML-% IV SOLN
1000.0000 mg | Freq: Two times a day (BID) | INTRAVENOUS | Status: DC
  Administered 2023-06-14: 1000 mg via INTRAVENOUS
  Filled 2023-06-13: qty 200

## 2023-06-13 MED ORDER — SODIUM CHLORIDE 0.9% FLUSH
3.0000 mL | Freq: Two times a day (BID) | INTRAVENOUS | Status: DC
  Administered 2023-06-13 – 2023-06-20 (×13): 3 mL via INTRAVENOUS

## 2023-06-13 MED ORDER — IOHEXOL 350 MG/ML SOLN
75.0000 mL | Freq: Once | INTRAVENOUS | Status: AC | PRN
  Administered 2023-06-13: 75 mL via INTRAVENOUS

## 2023-06-13 MED ORDER — ROSUVASTATIN CALCIUM 10 MG PO TABS
10.0000 mg | ORAL_TABLET | Freq: Every day | ORAL | Status: DC
  Administered 2023-06-14 – 2023-06-21 (×8): 10 mg via ORAL
  Filled 2023-06-13 (×8): qty 1

## 2023-06-13 MED ORDER — METHYLPREDNISOLONE SODIUM SUCC 40 MG IJ SOLR
40.0000 mg | Freq: Two times a day (BID) | INTRAMUSCULAR | Status: DC
  Administered 2023-06-14 – 2023-06-16 (×5): 40 mg via INTRAVENOUS
  Filled 2023-06-13 (×5): qty 1

## 2023-06-13 MED ORDER — VANCOMYCIN HCL IN DEXTROSE 1-5 GM/200ML-% IV SOLN: 1000.0000 mg | Freq: Once | INTRAVENOUS | Status: DC

## 2023-06-13 MED ORDER — METHYLPREDNISOLONE SODIUM SUCC 125 MG IJ SOLR
60.0000 mg | Freq: Once | INTRAMUSCULAR | Status: AC
  Administered 2023-06-13: 60 mg via INTRAVENOUS
  Filled 2023-06-13: qty 2

## 2023-06-13 MED ORDER — SODIUM CHLORIDE 0.9 % IV SOLN: INTRAVENOUS | Status: AC

## 2023-06-13 MED ORDER — FLUCONAZOLE 100 MG PO TABS
100.0000 mg | ORAL_TABLET | Freq: Every day | ORAL | Status: AC
  Administered 2023-06-13 – 2023-06-15 (×3): 100 mg via ORAL
  Filled 2023-06-13 (×3): qty 1

## 2023-06-13 MED ORDER — ROSUVASTATIN CALCIUM 10 MG PO TABS
10.0000 mg | ORAL_TABLET | Freq: Every day | ORAL | Status: DC
  Filled 2023-06-13: qty 1

## 2023-06-13 MED ORDER — NICOTINE 14 MG/24HR TD PT24
14.0000 mg | MEDICATED_PATCH | Freq: Every day | TRANSDERMAL | Status: DC
  Administered 2023-06-15 – 2023-06-21 (×6): 14 mg via TRANSDERMAL
  Filled 2023-06-13 (×8): qty 1

## 2023-06-13 MED ORDER — VANCOMYCIN HCL 1250 MG/250ML IV SOLN
1250.0000 mg | Freq: Once | INTRAVENOUS | Status: AC
  Administered 2023-06-13: 1250 mg via INTRAVENOUS
  Filled 2023-06-13: qty 250

## 2023-06-13 MED ORDER — SODIUM CHLORIDE 0.9 % IV SOLN
500.0000 mg | Freq: Once | INTRAVENOUS | Status: AC
  Administered 2023-06-13: 500 mg via INTRAVENOUS
  Filled 2023-06-13: qty 5

## 2023-06-13 MED ORDER — FLUCONAZOLE 100 MG PO TABS: 100.0000 mg | ORAL_TABLET | Freq: Every day | ORAL | Status: DC

## 2023-06-13 MED ORDER — ACETAMINOPHEN 650 MG RE SUPP: 650.0000 mg | Freq: Four times a day (QID) | RECTAL | Status: DC | PRN

## 2023-06-13 MED ORDER — SODIUM CHLORIDE 0.9 % IV SOLN: INTRAVENOUS | Status: DC

## 2023-06-13 NOTE — Telephone Encounter (Signed)
Received call from pt's wife, Olegario Messier, that pt has progressively been getting weaker over the weekend and started to develop increased shortness of breath and cough. States that pt's blood pressure is low and his O2 sats drop with activity while on oxygen. Continues to complain of mouth sores which prevent pt from eating and drinking adequately. Pt's wife is concerned that she will be unable to bring pt in for radiation and IVF today due to his weakness and other symptoms. Informed that if pt able to come in today then could add on to [facility] schedule to further assess or offered for pt to be transported by EMS to the ED for further evaluation. Pt's wife stated that pt has decided to pursue further evaluation at the ED.

## 2023-06-13 NOTE — H&P (Signed)
History and Physical    Patient: Charles Hickman BJY:[id number] DOB: 1961-06-21 DOA: 06/13/2023 DOS: the patient was seen and examined on 06/13/2023 PCP: Dorothey Baseman, MD  Patient coming from: Home  Chief Complaint:  Chief Complaint  Patient presents with   Shortness of Breath    HPI: Charles Hickman is a 62 y.o. 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  Per wife's report patient has been getting weaker over the past few days had been having increased cough.  Patient decided to come to the emergency room as his condition declined and while in EMS patient's blood pressure dropped to over 70s over 50s per wife and EMS had to stop in the middle and was given IV fluids with some improvement of blood pressure.  Patient also has history of pleural effusion and complete opacification of his right side of chest on chest x-ray .  Patient is pale, skin is cool to touch, diaphoretic.  Stable alert awake oriented and answers questions.  In the emergency room his blood pressure has been mostly in the 90s and 89 systolic has been dyspneic with respirations ranging from 20-29.  O2 sats have been 96% and he is currently on 6 L nasal cannula. In the emergency room blood work shows hyponatremia of 133 bicarb of 16 glucose 203 normal creatinine anion gap of 18 albumin 2.5 total bili of 1.5 AST ALT within normal limits.  Elevated BNP of 586.1, lactic acidosis of 3.7, 3 point 1 repeat, leukocytosis of 10.6 anemia of 12.8 platelet count of 87, INR of 2.5, respiratory panel negative for flu COVID.  Urinalysis negative for bacteria leukocytes nitrites WBC 0-5.  Blood cultures collected in the  emergency room as patient meets severe sepsis criteria.  Chest x-ray today shows similar asymmetric opacification of the right lung with volume loss and rightward mediastinal shift similar interstitial nodular opacities of the left lung compared to the last x-ray presumed large left pleural effusion.EKG today shows sinus rhythm at 92 PR interval of 141 nonspecific T wave abnormality in the lateral leads QTc of 469.  Review of Systems: Review of Systems  Respiratory:  Positive for cough, sputum production and shortness of breath.   All other systems reviewed and are negative.   Past Medical History:  Diagnosis Date   Abnormal EKG 11/15/2017   Abnormal stress test    Atrial fibrillation (HCC)    CAD in native artery    a. 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 No EKG changes concerning for ischemia at peak stress or in recovery. Mod to high risk scan; b. LHC 12/15/17: pLAD 75% w/ FFR 0.74 s/p PCI/DES, mRCA 90%, CTO dRCA w/ L-R colatts, EF 45-50%, inf HK    Diabetes mellitus (HCC)    HCV (hepatitis C virus)    HLD (hyperlipidemia)    Hypertension    Large cell lymphoma (HCC)    a. s/p 6 cycles of R-CHOP   Peripheral neuropathy    Stroke (HCC)    Systolic dysfunction  a. TTE 8/16: EF > 55%, aortic sclerosis, normal RV systolic function; b. LHC 12/15/17: EF 45-50%, inf HK   Past Surgical History:  Procedure Laterality Date   APPENDECTOMY     CORONARY PRESSURE/FFR STUDY N/A 12/15/2017   Procedure: INTRAVASCULAR PRESSURE WIRE/FFR STUDY;  Surgeon: Iran Ouch, MD;  Location: [facility] INVASIVE CV LAB;  Service: Cardiovascular;  Laterality: N/A;   IR GUIDED DRAIN W CATHETER PLACEMENT  05/02/2023   IR IMAGING GUIDED PORT INSERTION  05/02/2023   IR RADIOLOGIST EVAL & MGMT  05/10/2023   IR US GUIDE BX ASP/DRAIN  05/02/2023   LEFT HEART CATH AND CORONARY  ANGIOGRAPHY N/A 12/15/2017   Procedure: LEFT HEART CATH AND CORONARY ANGIOGRAPHY;  Surgeon: Iran Ouch, MD;  Location: [facility] INVASIVE CV LAB;  Service: Cardiovascular;  Laterality: N/A;   TONSILLECTOMY AND ADENOIDECTOMY     Social History:  reports that he quit smoking about 5 weeks ago. His smoking use included cigarettes. He smoked an average of .5 packs per day. He has never used smokeless tobacco. He reports current alcohol use. He reports that he does not use drugs.  No Known Allergies  Family History  Problem Relation Age of Onset   Hypertension Mother    Hyperlipidemia Mother    Stroke Mother    Diabetes Mother    Kidney disease Father    Hypertension Father    Diabetes Father    Dementia Maternal Grandmother     Prior to Admission medications   Medication Sig Start Date End Date Taking? Authorizing Provider  acetaminophen (TYLENOL) 500 MG tablet Take 500 mg by mouth every 6 (six) hours as needed.    [provider]  apixaban (ELIQUIS) 5 MG TABS tablet TAKE 1 TABLET BY MOUTH TWICE A DAY 04/12/23   Antonieta Iba, MD  blood glucose meter kit and supplies KIT Dispense based on patient and insurance preference. Use 2 times daily as directed. (FOR ICD-10 E11.9). Patient not taking: Reported on 05/09/2023 09/29/20   Glori Luis, MD  calcium carbonate (CALCIUM 600) 600 MG TABS tablet Take 2 tablets (1,200 mg total) by mouth daily. 06/03/23   Alinda Dooms, NP  cholecalciferol (VITAMIN D3) 25 MCG (1000 UNIT) tablet Take 1 tablet (1,000 Units total) by mouth daily. 06/03/23   Alinda Dooms, NP  dexamethasone (DECADRON) 2 MG tablet Take 1 tablet (2 mg total) by mouth daily. 06/03/23   Alinda Dooms, NP  diltiazem (CARDIZEM) 30 MG tablet Take 1 tablet (30 mg total) by mouth daily as needed (for fast heart rate). Patient not taking: Reported on 06/06/2023 05/11/23 05/10/24  Kathrynn Running, MD  fentaNYL (DURAGESIC) 25 MCG/HR Place 1 patch onto the skin every 3  (three) days. 05/18/23   Jeralyn Ruths, MD  fluconazole (DIFLUCAN) 100 MG tablet Take 1 tablet (100 mg total) by mouth daily. 06/06/23   Borders, Daryl Eastern, NP  JARDIANCE 25 MG TABS tablet TAKE 1 TABLET BY MOUTH EVERY DAY 02/22/22   Worthy Rancher B, FNP  lidocaine-prilocaine (EMLA) cream Apply to affected area once 04/28/23   Jeralyn Ruths, MD  metoprolol succinate (TOPROL-XL) 100 MG 24 hr tablet Take 1 tablet (100 mg total) by mouth daily. TAKE WITH OR IMMEDIATELY FOLLOWING A MEAL. 05/11/23   Wouk, Wilfred Curtis, MD  mirtazapine (REMERON) 7.5 MG tablet Take 1 tablet (7.5 mg total) by mouth at bedtime. For mood, appetite, and sleep Patient not taking: Reported on 06/06/2023 06/03/23   Consuello Masse  G, NP  Multiple Vitamin (MULTI-VITAMIN) tablet Take 1 tablet by mouth daily.    [provider]  naloxone [facility]) nasal spray 4 mg/0.1 mL See admin instructions. Patient not taking: Reported on 06/06/2023 04/22/23   [provider]  nitroGLYCERIN (NITROSTAT) 0.4 MG SL tablet Place 1 tablet (0.4 mg total) under the tongue every 5 (five) minutes as needed for chest pain. Patient not taking: Reported on 06/06/2023 05/28/22   Antonieta Iba, MD  nystatin (MYCOSTATIN) 100000 UNIT/ML suspension Take 5 mLs (500,000 Units total) by mouth 4 (four) times daily. Swish and spit. 05/12/23   Jeralyn Ruths, MD  ondansetron (ZOFRAN) 8 MG tablet Take 1 tablet (8 mg total) by mouth every 8 (eight) hours as needed for nausea or vomiting. Start on the third day after carboplatin. Patient not taking: Reported on 06/06/2023 04/28/23   Jeralyn Ruths, MD  [facility] VERIO test strip USE TWICE A DAY AS DIRECTED Patient not taking: Reported on 05/09/2023 11/20/20   Glori Luis, MD  oxyCODONE (OXY IR/ROXICODONE) 5 MG immediate release tablet Take 1 tablet (5 mg total) by mouth every 6 (six) hours as needed for severe pain. 04/12/23   Jeralyn Ruths, MD  prochlorperazine (COMPAZINE) 10 MG tablet Take  1 tablet (10 mg total) by mouth every 6 (six) hours as needed for nausea or vomiting. 04/28/23   Jeralyn Ruths, MD  rosuvastatin (CRESTOR) 10 MG tablet TAKE 1 TABLET BY MOUTH EVERY DAY 05/26/23   Antonieta Iba, MD     Vitals:   06/13/23 1900 06/13/23 1930 06/13/23 2000 06/13/23 2030  BP: 98/70 (!) 89/63 103/67 103/70  Pulse: 89 88 84 83  Resp: 15 (!) 22 20 (!) 21  Temp:      TempSrc:      SpO2: 96% 98% 100% 100%  Weight:      Height:       Physical Exam Vitals and nursing note reviewed.  Constitutional:      General: He is not in acute distress. HENT:     Head: Normocephalic and atraumatic.     Right Ear: Hearing normal.     Left Ear: Hearing normal.     Nose: Nose normal. No nasal deformity.     Mouth/Throat:     Lips: Pink.     Tongue: No lesions.     Pharynx: Oropharynx is clear.  Eyes:     General: Lids are normal.     Extraocular Movements: Extraocular movements intact.  Cardiovascular:     Rate and Rhythm: Normal rate and regular rhythm.     Heart sounds: Normal heart sounds.  Pulmonary:     Breath sounds: Rales present.  Abdominal:     General: Bowel sounds are normal. There is no distension.     Palpations: Abdomen is soft. There is no mass.     Tenderness: There is no abdominal tenderness.  Musculoskeletal:     Right lower leg: No edema.     Left lower leg: No edema.  Skin:    General: Skin is warm.  Neurological:     General: No focal deficit present.     Mental Status: He is alert and oriented to person, place, and time.     Cranial Nerves: Cranial nerves 2-12 are intact.  Psychiatric:        Attention and Perception: Attention normal.        Mood and Affect: Mood normal.  Speech: Speech normal.        Behavior: Behavior normal. Behavior is cooperative.      Labs on Admission: I have personally reviewed following labs and imaging studies  CBC: Recent Labs  Lab 06/13/23 1640  WBC 10.6*  NEUTROABS 8.4*  HGB 12.8*  HCT 39.8  MCV  86.3  PLT 87*   Basic Metabolic Panel: Recent Labs  Lab 06/13/23 1640  NA 133*  K 4.3  CL 99  CO2 16*  GLUCOSE 203*  BUN 39*  CREATININE 0.83  CALCIUM 8.0*   GFR: Estimated Creatinine Clearance: 85.3 mL/min (by C-G formula based on SCr of 0.83 mg/dL). Liver Function Tests: Recent Labs  Lab 06/13/23 1640  AST 19  ALT 16  ALKPHOS 123  BILITOT 1.5*  PROT 5.3*  ALBUMIN 2.5*   No results for input(s): "LIPASE", "AMYLASE" in the last 168 hours. No results for input(s): "AMMONIA" in the last 168 hours. Coagulation Profile: Recent Labs  Lab 06/13/23 1640  INR 2.5*   Cardiac Enzymes: No results for input(s): "CKTOTAL", "CKMB", "CKMBINDEX", "TROPONINI" in the last 168 hours. BNP (last 3 results) No results for input(s): "PROBNP" in the last 8760 hours. HbA1C: No results for input(s): "HGBA1C" in the last 72 hours. CBG: No results for input(s): "GLUCAP" in the last 168 hours. Lipid Profile: No results for input(s): "CHOL", "HDL", "LDLCALC", "TRIG", "CHOLHDL", "LDLDIRECT" in the last 72 hours. Thyroid Function Tests: No results for input(s): "TSH", "T4TOTAL", "FREET4", "T3FREE", "THYROIDAB" in the last 72 hours. Anemia Panel: No results for input(s): "VITAMINB12", "FOLATE", "FERRITIN", "TIBC", "IRON", "RETICCTPCT" in the last 72 hours. Urine analysis:    Component Value Date/Time   COLORURINE YELLOW (A) 06/13/2023 1953   APPEARANCEUR HAZY (A) 06/13/2023 1953   APPEARANCEUR Clear 10/25/2013 0417   LABSPEC 1.025 06/13/2023 1953   LABSPEC 1.019 10/25/2013 0417   PHURINE 5.0 06/13/2023 1953   GLUCOSEU >=500 (A) 06/13/2023 1953   GLUCOSEU 50 mg/dL 60/45/4098 1191   HGBUR NEGATIVE 06/13/2023 1953   BILIRUBINUR NEGATIVE 06/13/2023 1953   BILIRUBINUR Negative 10/25/2013 0417   KETONESUR 20 (A) 06/13/2023 1953   PROTEINUR NEGATIVE 06/13/2023 1953   NITRITE NEGATIVE 06/13/2023 1953   LEUKOCYTESUR NEGATIVE 06/13/2023 1953   LEUKOCYTESUR Negative 10/25/2013 0417     Radiological Exams on Admission: DG Chest Port 1 View  Result Date: 06/13/2023 CLINICAL DATA:  Shortness of breath and increasing weakness EXAM: PORTABLE CHEST 1 VIEW COMPARISON:  Chest radiograph dated 05/18/2023 FINDINGS: Right chest wall port tip projects over the superior cavoatrial junction. Right pleural catheter tip projects over the right apex. Similar asymmetric opacification of the right lung with volume loss resulting in rightward mediastinal shift. Similar interstitial and nodular opacities of the left lung. Presumed large left pleural effusion. Heart borders are obscured. No acute osseous abnormality. IMPRESSION: 1. Similar asymmetric opacification of the right lung with volume loss resulting in rightward mediastinal shift. 2. Similar interstitial and nodular opacities of the left lung. 3. Presumed large left pleural effusion. Electronically Signed   By: Agustin Cree M.D.   On: 06/13/2023 17:30     Data Reviewed: Relevant notes from primary care and specialist visits, past discharge summaries as available in EHR, including Care Everywhere. Prior diagnostic testing as pertinent to current admission diagnoses Updated medications and problem lists for reconciliation ED course, including vitals, labs, imaging, treatment and response to treatment Triage notes, nursing and pharmacy notes and ED provider's notes Notable results as noted in HPI Assessment and Plan: ]>> Acute  on chronic respiratory failure with hypoxia: Secondary to suspected worsening pleural effusion and/or lung cancer. Continue with supplemental oxygen for goal O2 sats above 88%. Pulmonary consult as deemed appropriate, CTA ordered and patient may need IR for thoracentesis. Empiric broad-spectrum antibiotics as patient meets severe sepsis criteria.  >> Severe sepsis: Suspected pneumonia, continue with broad-spectrum IV antibiotics per sepsis protocol.  Elevated lactic acid levels.  We will trend and follow. Follow  culture sensitivity and tailor antibiotics further.  Will hold Jardiance and diltiazem ,cautious IV fluid hydration to prevent volume overload and pulmonary vascular congestion and further worsening of his respiratory failure. As needed albuterol as needed.  >> paroxsymal Atrial fibrillation: Currently patient is in sinus rhythm at 90 with nonspecific T wave abnormalities.  Continue anticoagulation with Eliquis. Diltiazem held secondary to hypotension.  Monitor electrolytes and correct.  >> Anemia: Suspect anemia of chronic disease, patient is currently followed by hematology oncology for his cancer. Will follow H&H.  No history of gross bleeding.   >> Diabetes mellitus type 2: Glycemic protocol to monitor blood sugars every 4 hours to prevent hypoglycemia.  >> CAD: Currently we will continue patient on Eliquis, and rosuvastatin 10 mg. No complaints of chest discomfort.  DVT prophylaxis:  Eliquis  Consults:  None  Advance Care Planning:    Code Status: Full Code   Family Communication:  Wife at bedside  Disposition Plan:  Back to previous home environment  Severity of Illness: The appropriate patient status for this patient is INPATIENT. Inpatient status is judged to be reasonable and necessary in order to provide the required intensity of service to ensure the patient's safety. The patient's presenting symptoms, physical exam findings, and initial radiographic and laboratory data in the context of their chronic comorbidities is felt to place them at high risk for further clinical deterioration. Furthermore, it is not anticipated that the patient will be medically stable for discharge from the hospital within 2 midnights of admission.   * I certify that at the point of admission it is my clinical judgment that the patient will require inpatient hospital care spanning beyond 2 midnights from the point of admission due to high intensity of service, high risk for further  deterioration and high frequency of surveillance required.*  Author: Gertha Calkin, MD 06/13/2023 8:55 PM  For on call review [email or web address].

## 2023-06-13 NOTE — Progress Notes (Signed)
Pharmacy Antibiotic Note  Charles Hickman is a 62 y.o. 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  Pharmacy has been consulted for vancomycin and cefepime dosing.  Plan:  1) start cefepime 2 grams IV every 8 hours  2) start vancomycin 1250 mg IV x 1 then 1000 mg every 12 hours Goal AUC 400-550. Expected AUC: 551  SCr used: 0.83 mg/dL   Height: 5\' 10"  (177.8 cm) Weight: 67.1 kg (147 lb 14.9 oz) IBW/kg (Calculated) : 73  Temp (24hrs), Avg:98.2 F (36.8 C), Min:98.2 F (36.8 C), Max:98.2 F (36.8 C)  Recent Labs  Lab 06/13/23 1640 06/13/23 1831  WBC 10.6*  --   CREATININE 0.83  --   LATICACIDVEN 3.7* 3.1*    Estimated Creatinine Clearance: 85.3 mL/min (by C-G formula based on SCr of 0.83 mg/dL).    No Known Allergies  Antimicrobials this admission: 06/24 azithromycin >> 06/24 vancomycin >>  06/24 cefepime >>   Microbiology results: 06/24 BCx: pending 06/24 MRSA PCR: pending  Thank you for allowing pharmacy to be a part of this patient's care.  Charles Hickman 06/13/2023 9:00 PM

## 2023-06-13 NOTE — ED Triage Notes (Signed)
Pt to ED via [facility] from home for shortness of breath. Pt has hx/o lung cancer. Pt had chemo on Friday and has gotten weaker and more short of breath. Pt chronically wears 2 liters of oxygen. Pts sats were initially in the lower 90's. EMS increased O2 to 6 liters with sats improving to 94-96%. Pt reports cough and that he has been feeling worse over the last 3 days. EMS reports pts initial blood pressure was 80/50 and RR 27. Pt was given 250 cc of fluid with some improvement in BP.

## 2023-06-13 NOTE — ED Provider Notes (Signed)
[facility] Provider Note    Event Date/Time   First MD Initiated Contact with Patient 06/13/23 1620     (approximate)   History   Shortness of Breath   HPI  Charles Hickman is a 62 y.o. male extensive past medical history including A-fib, diabetes, hep C, large cell lymphoma, recurrent pleural effusions, COPD on home oxygen presents to the ER for evaluation of worsening shortness of breath and O2 saturations as low as 70 at home despite increasing O2.  Particularly having shortness of breath with ambulation.     Physical Exam   Triage Vital Signs: ED Triage Vitals  Enc Vitals Group     BP 06/13/23 1635 104/74     Pulse Rate 06/13/23 1635 94     Resp 06/13/23 1635 (!) 29     Temp 06/13/23 1635 98.2 F (36.8 C)     Temp Source 06/13/23 1635 Oral     SpO2 06/13/23 1632 94 %     Weight 06/13/23 1636 147 lb 14.9 oz (67.1 kg)     Height 06/13/23 1636 5\' 10"  (1.778 m)     Head Circumference --      Peak Flow --      Pain Score 06/13/23 1637 6     Pain Loc --      Pain Edu? --      Excl. in GC? --     Most recent vital signs: Vitals:   06/13/23 1709 06/13/23 1730  BP:    Pulse: 94 88  Resp: (!) 30 (!) 22  Temp:    SpO2: 95% 96%     Constitutional: Alert  Eyes: Conjunctivae are normal.  Head: Atraumatic. Nose: No congestion/rhinnorhea. Mouth/Throat: Mucous membranes are moist.   Neck: Painless ROM.  Cardiovascular:   Good peripheral circulation. Respiratory: Mild tachypnea with absent breath sounds on the right.  Scattered rhonchi on the left. Gastrointestinal: Soft and nontender.  Musculoskeletal:  no deformity Neurologic:  MAE spontaneously. No gross focal neurologic deficits are appreciated.  Skin:  Skin is warm, dry and intact. No rash noted. Psychiatric: Mood and affect are normal. Speech and behavior are normal.    ED Results / Procedures / Treatments   Labs (all labs ordered are listed, but only abnormal results are  displayed) Labs Reviewed  COMPREHENSIVE METABOLIC PANEL - Abnormal; Notable for the following components:      Result Value   Sodium 133 (*)    CO2 16 (*)    Glucose, Bld 203 (*)    BUN 39 (*)    Calcium 8.0 (*)    Total Protein 5.3 (*)    Albumin 2.5 (*)    Total Bilirubin 1.5 (*)    Anion gap 18 (*)    All other components within normal limits  LACTIC ACID, PLASMA - Abnormal; Notable for the following components:   Lactic Acid, Venous 3.7 (*)    All other components within normal limits  CBC WITH DIFFERENTIAL/PLATELET - Abnormal; Notable for the following components:   WBC 10.6 (*)    Hemoglobin 12.8 (*)    RDW 19.3 (*)    Platelets 87 (*)    Neutro Abs 8.4 (*)    Lymphs Abs 0.3 (*)    Abs Immature Granulocytes 0.91 (*)    All other components within normal limits  PROTIME-INR - Abnormal; Notable for the following components:   Prothrombin Time 27.5 (*)    INR 2.5 (*)    All  other components within normal limits  CULTURE, BLOOD (ROUTINE X 2)  CULTURE, BLOOD (ROUTINE X 2)  RESP PANEL BY RT-PCR (RSV, FLU A&B, COVID)  RVPGX2  LACTIC ACID, PLASMA  URINALYSIS, ROUTINE W REFLEX MICROSCOPIC  BRAIN NATRIURETIC PEPTIDE     EKG  ED ECG REPORT I, Willy Eddy, the attending physician, personally viewed and interpreted this ECG.   Date: 06/13/2023  EKG Time: 16:29  Rate: 90  Rhythm: sinus  Axis: normal  Intervals: normal  ST&T Change: nonspecific t wave abn    RADIOLOGY Please see ED Course for my review and interpretation.  I personally reviewed all radiographic images ordered to evaluate for the above acute complaints and reviewed radiology reports and findings.  These findings were personally discussed with the patient.  Please see medical record for radiology report.    PROCEDURES:  Critical Care performed: Yes, see critical care procedure note(s)  .Critical Care  Performed by: Willy Eddy, MD Authorized by: Willy Eddy, MD   Critical  care provider statement:    Critical care time (minutes):  35   Critical care was necessary to treat or prevent imminent or life-threatening deterioration of the following conditions:  Respiratory failure   Critical care was time spent personally by me on the following activities:  Ordering and performing treatments and interventions, ordering and review of laboratory studies, ordering and review of radiographic studies, pulse oximetry, re-evaluation of patient's condition, review of old charts, obtaining history from patient or surrogate, examination of patient, evaluation of patient's response to treatment, discussions with primary provider, discussions with consultants and development of treatment plan with patient or surrogate    MEDICATIONS ORDERED IN ED: Medications  sodium chloride 0.9 % bolus 1,000 mL (has no administration in time range)  0.9 %  sodium chloride infusion (has no administration in time range)  ceFEPIme (MAXIPIME) 2 g in sodium chloride 0.9 % 100 mL IVPB (has no administration in time range)  azithromycin (ZITHROMAX) 500 mg in sodium chloride 0.9 % 250 mL IVPB (has no administration in time range)  vancomycin (VANCOREADY) IVPB 1250 mg/250 mL (has no administration in time range)  albuterol (PROVENTIL) (2.5 MG/3ML) 0.083% nebulizer solution 2.5 mg (has no administration in time range)     IMPRESSION / MDM / ASSESSMENT AND PLAN / ED COURSE  I reviewed the triage vital signs and the nursing notes.                              Differential diagnosis includes, but is not limited to, Asthma, copd, CHF, pna, ptx, malignancy, Pe, anemia  Patient presenting to the ER for evaluation of symptoms as described above.  Based on symptoms, risk factors and considered above differential, this presenting complaint could reflect a potentially life-threatening illness therefore the patient will be placed on continuous pulse oximetry and telemetry for monitoring.  Laboratory evaluation  will be sent to evaluate for the above complaints.  Patient is chronically ill-appearing but with evidence of acute on chronic respiratory failure with hypoxia.  Chest x-ray my review and interpretation shows evidence of complete opacification of the right hemithorax which is consistent with previous.  I do not see any evidence of pneumothorax.  Nodular opacities in the left hemithorax.  On review of his medical record record denies any history of CHF recent echo with preserved EF.  Possible bronchitis but given productive cough more concern for pneumonia.  He has had poor p.o. intake  recently will give IV fluids.  Have a lower suspicion for PE as he is compliant with his anticoagulation.    Clinical Course as of 06/13/23 1807  Mon Jun 13, 2023  1759 Workup is most consistent with sepsis and pneumonia in the setting acute on chronic respiratory failure with hypoxia.  Does feel more comfortable now at rest.  I have ordered broad-spectrum antibiotics as well as IV fluids for his lactic acidosis that I think a large component of this lactate elevation is secondary to prolonged hypoxia while at home.  Not consistent with PE as he is anticoagulated.  Will consult hospitalist for admission. [PR]    Clinical Course User Index [PR] Willy Eddy, MD     FINAL CLINICAL IMPRESSION(S) / ED DIAGNOSES   Final diagnoses:  Acute respiratory failure with hypoxia (HCC)     Rx / DC Orders   ED Discharge Orders     None        Note:  This document was prepared using Dragon voice recognition software and may include unintentional dictation errors.    Willy Eddy, MD 06/13/23 [phone]

## 2023-06-13 NOTE — ED Notes (Signed)
Pt taken to CT at this time with CT Tech, Romeo Apple.  Pt will be taken to ED room 33 following CT scan.

## 2023-06-13 NOTE — Progress Notes (Signed)
PHARMACY -  BRIEF ANTIBIOTIC NOTE   Pharmacy has received consult(s) for cefepime and vancomycin from an ED provider.  The patient's profile has been reviewed for ht/wt/allergies/indication/available labs.    One time order(s) placed for   1) cefepime 2 grams IV x 1  2) vancomycin 1250 mg IV x 1  Further antibiotics/pharmacy consults should be ordered by admitting physician if indicated.                       Thank you, RAMAN FEATHERSTON 06/13/2023  5:59 PM

## 2023-06-14 ENCOUNTER — Ambulatory Visit: Payer: Managed Care, Other (non HMO)

## 2023-06-14 ENCOUNTER — Ambulatory Visit: Payer: Managed Care, Other (non HMO) | Admitting: Medical

## 2023-06-14 ENCOUNTER — Encounter: Payer: Self-pay | Admitting: Internal Medicine

## 2023-06-14 DIAGNOSIS — F17219 Nicotine dependence, cigarettes, with unspecified nicotine-induced disorders: Secondary | ICD-10-CM

## 2023-06-14 DIAGNOSIS — Z515 Encounter for palliative care: Secondary | ICD-10-CM | POA: Diagnosis not present

## 2023-06-14 DIAGNOSIS — J90 Pleural effusion, not elsewhere classified: Secondary | ICD-10-CM

## 2023-06-14 DIAGNOSIS — I2510 Atherosclerotic heart disease of native coronary artery without angina pectoris: Secondary | ICD-10-CM

## 2023-06-14 DIAGNOSIS — J9621 Acute and chronic respiratory failure with hypoxia: Secondary | ICD-10-CM

## 2023-06-14 DIAGNOSIS — I4819 Other persistent atrial fibrillation: Secondary | ICD-10-CM | POA: Diagnosis not present

## 2023-06-14 DIAGNOSIS — I10 Essential (primary) hypertension: Secondary | ICD-10-CM

## 2023-06-14 LAB — COMPREHENSIVE METABOLIC PANEL
ALT: 16 U/L (ref 0–44)
AST: 17 U/L (ref 15–41)
Albumin: 2.1 g/dL — ABNORMAL LOW (ref 3.5–5.0)
Alkaline Phosphatase: 103 U/L (ref 38–126)
Anion gap: 16 — ABNORMAL HIGH (ref 5–15)
BUN: 23 mg/dL (ref 8–23)
CO2: 14 mmol/L — ABNORMAL LOW (ref 22–32)
Calcium: 7.6 mg/dL — ABNORMAL LOW (ref 8.9–10.3)
Chloride: 104 mmol/L (ref 98–111)
Creatinine, Ser: 0.57 mg/dL — ABNORMAL LOW (ref 0.61–1.24)
GFR, Estimated: >60 mL/min (ref >60)
Glucose, Bld: 131 mg/dL — ABNORMAL HIGH (ref 70–99)
Potassium: 3.6 mmol/L (ref 3.5–5.1)
Sodium: 134 mmol/L — ABNORMAL LOW (ref 135–145)
Total Bilirubin: 1.6 mg/dL — ABNORMAL HIGH (ref 0.3–1.2)
Total Protein: 4.9 g/dL — ABNORMAL LOW (ref 6.5–8.1)

## 2023-06-14 LAB — BASIC METABOLIC PANEL
Anion gap: 18 — ABNORMAL HIGH (ref 5–15)
BUN: 17 mg/dL (ref 8–23)
CO2: 14 mmol/L — ABNORMAL LOW (ref 22–32)
Calcium: 7.6 mg/dL — ABNORMAL LOW (ref 8.9–10.3)
Chloride: 99 mmol/L (ref 98–111)
Creatinine, Ser: 0.52 mg/dL — ABNORMAL LOW (ref 0.61–1.24)
GFR, Estimated: >60 mL/min (ref >60)
Glucose, Bld: 214 mg/dL — ABNORMAL HIGH (ref 70–99)
Potassium: 3.3 mmol/L — ABNORMAL LOW (ref 3.5–5.1)
Sodium: 131 mmol/L — ABNORMAL LOW (ref 135–145)

## 2023-06-14 LAB — CBC
HCT: 33.5 % — ABNORMAL LOW (ref 39.0–52.0)
Hemoglobin: 10.9 g/dL — ABNORMAL LOW (ref 13.0–17.0)
MCH: 28.4 pg (ref 26.0–34.0)
MCHC: 32.5 g/dL (ref 30.0–36.0)
MCV: 87.2 fL (ref 80.0–100.0)
Platelets: 75 10*3/uL — ABNORMAL LOW (ref 150–400)
RBC: 3.84 MIL/uL — ABNORMAL LOW (ref 4.22–5.81)
RDW: 19.7 % — ABNORMAL HIGH (ref 11.5–15.5)
WBC: 8.3 10*3/uL (ref 4.0–10.5)
nRBC: 0 % (ref 0.0–0.2)

## 2023-06-14 LAB — LACTIC ACID, PLASMA
Lactic Acid, Venous: 2.1 mmol/L (ref 0.5–1.9)
Lactic Acid, Venous: 2 mmol/L (ref 0.5–1.9)

## 2023-06-14 LAB — STREP PNEUMONIAE URINARY ANTIGEN: Strep Pneumo Urinary Antigen: NEGATIVE

## 2023-06-14 LAB — MRSA NEXT GEN BY PCR, NASAL: MRSA by PCR Next Gen: NOT DETECTED

## 2023-06-14 LAB — TROPONIN I (HIGH SENSITIVITY)
Troponin I (High Sensitivity): 22 ng/L — ABNORMAL HIGH (ref <18)
Troponin I (High Sensitivity): 22 ng/L — ABNORMAL HIGH (ref <18)

## 2023-06-14 LAB — CULTURE, BLOOD (ROUTINE X 2)

## 2023-06-14 LAB — GLUCOSE, CAPILLARY
Glucose-Capillary: 197 mg/dL — ABNORMAL HIGH (ref 70–99)
Glucose-Capillary: 207 mg/dL — ABNORMAL HIGH (ref 70–99)
Glucose-Capillary: 216 mg/dL — ABNORMAL HIGH (ref 70–99)

## 2023-06-14 LAB — PROCALCITONIN: Procalcitonin: 1.12 ng/mL

## 2023-06-14 MED ORDER — SODIUM CHLORIDE 0.9% FLUSH
10.0000 mL | Freq: Two times a day (BID) | INTRAVENOUS | Status: DC
  Administered 2023-06-14 – 2023-06-20 (×11): 10 mL

## 2023-06-14 MED ORDER — ENSURE ENLIVE PO LIQD
237.0000 mL | Freq: Two times a day (BID) | ORAL | Status: DC
  Administered 2023-06-14 – 2023-06-15 (×3): 237 mL via ORAL

## 2023-06-14 MED ORDER — DILTIAZEM HCL 60 MG PO TABS
30.0000 mg | ORAL_TABLET | Freq: Three times a day (TID) | ORAL | Status: DC
  Administered 2023-06-14: 30 mg via ORAL
  Filled 2023-06-14: qty 1

## 2023-06-14 MED ORDER — AMIODARONE HCL IN DEXTROSE 360-4.14 MG/200ML-% IV SOLN
60.0000 mg/h | INTRAVENOUS | Status: AC
  Administered 2023-06-14 (×2): 60 mg/h via INTRAVENOUS
  Filled 2023-06-14 (×2): qty 200

## 2023-06-14 MED ORDER — PHENYLEPHRINE HCL-NACL 20-0.9 MG/250ML-% IV SOLN
0.0000 ug/min | INTRAVENOUS | Status: DC
  Administered 2023-06-14: 25 ug/min via INTRAVENOUS
  Filled 2023-06-14: qty 250

## 2023-06-14 MED ORDER — SODIUM CHLORIDE 0.9 % IV SOLN
250.0000 mL | INTRAVENOUS | Status: DC
  Administered 2023-06-14: 250 mL via INTRAVENOUS

## 2023-06-14 MED ORDER — AMIODARONE HCL IN DEXTROSE 360-4.14 MG/200ML-% IV SOLN
30.0000 mg/h | INTRAVENOUS | Status: AC
  Administered 2023-06-15: 30 mg/h via INTRAVENOUS
  Filled 2023-06-14: qty 200

## 2023-06-14 MED ORDER — METOPROLOL TARTRATE 5 MG/5ML IV SOLN
2.5000 mg | Freq: Once | INTRAVENOUS | Status: AC
  Administered 2023-06-14: 2.5 mg via INTRAVENOUS
  Filled 2023-06-14: qty 5

## 2023-06-14 MED ORDER — AMIODARONE LOAD VIA INFUSION
150.0000 mg | Freq: Once | INTRAVENOUS | Status: AC
  Administered 2023-06-14: 150 mg via INTRAVENOUS
  Filled 2023-06-14: qty 83.34

## 2023-06-14 MED ORDER — SODIUM BICARBONATE 8.4 % IV SOLN
INTRAVENOUS | Status: DC
  Filled 2023-06-14: qty 1000

## 2023-06-14 MED ORDER — NOREPINEPHRINE 4 MG/250ML-% IV SOLN
INTRAVENOUS | Status: AC
  Administered 2023-06-14: 5 ug/min via INTRAVENOUS
  Filled 2023-06-14: qty 250

## 2023-06-14 MED ORDER — MIDODRINE HCL 5 MG PO TABS
5.0000 mg | ORAL_TABLET | Freq: Three times a day (TID) | ORAL | Status: DC
  Administered 2023-06-14 – 2023-06-15 (×2): 5 mg via ORAL
  Filled 2023-06-14 (×2): qty 1

## 2023-06-14 MED ORDER — DILTIAZEM HCL 25 MG/5ML IV SOLN: 10.0000 mg | Freq: Once | INTRAVENOUS | Status: AC

## 2023-06-14 MED ORDER — IPRATROPIUM BROMIDE 0.02 % IN SOLN
0.5000 mg | Freq: Four times a day (QID) | RESPIRATORY_TRACT | Status: DC
  Administered 2023-06-14 – 2023-06-17 (×11): 0.5 mg via RESPIRATORY_TRACT
  Filled 2023-06-14 (×12): qty 2.5

## 2023-06-14 MED ORDER — LEVALBUTEROL HCL 1.25 MG/0.5ML IN NEBU
1.2500 mg | INHALATION_SOLUTION | Freq: Four times a day (QID) | RESPIRATORY_TRACT | Status: DC
  Administered 2023-06-14 – 2023-06-17 (×11): 1.25 mg via RESPIRATORY_TRACT
  Filled 2023-06-14 (×12): qty 0.5

## 2023-06-14 MED ORDER — FENTANYL 25 MCG/HR TD PT72
1.0000 | MEDICATED_PATCH | TRANSDERMAL | Status: DC
  Administered 2023-06-14 – 2023-06-20 (×3): 1 via TRANSDERMAL
  Filled 2023-06-14 (×3): qty 1

## 2023-06-14 MED ORDER — POTASSIUM CHLORIDE CRYS ER 20 MEQ PO TBCR
40.0000 meq | EXTENDED_RELEASE_TABLET | Freq: Once | ORAL | Status: AC
  Administered 2023-06-14: 40 meq via ORAL
  Filled 2023-06-14: qty 2

## 2023-06-14 MED ORDER — NOREPINEPHRINE 4 MG/250ML-% IV SOLN: 0.0000 ug/min | INTRAVENOUS | Status: DC

## 2023-06-14 MED ORDER — INSULIN ASPART 100 UNIT/ML IJ SOLN
5.0000 [IU] | Freq: Once | INTRAMUSCULAR | Status: AC
  Administered 2023-06-14: 5 [IU] via SUBCUTANEOUS
  Filled 2023-06-14: qty 1

## 2023-06-14 MED ORDER — SODIUM BICARBONATE 8.4 % IV SOLN
100.0000 meq | Freq: Once | INTRAVENOUS | Status: AC
  Administered 2023-06-14: 100 meq via INTRAVENOUS
  Filled 2023-06-14: qty 50

## 2023-06-14 MED ORDER — NOREPINEPHRINE 4 MG/250ML-% IV SOLN: 2.0000 ug/min | INTRAVENOUS | Status: DC

## 2023-06-14 MED ORDER — DILTIAZEM HCL 25 MG/5ML IV SOLN
INTRAVENOUS | Status: AC
  Administered 2023-06-14: 10 mg via INTRAVENOUS
  Filled 2023-06-14: qty 5

## 2023-06-14 MED ORDER — BUDESONIDE 0.5 MG/2ML IN SUSP
0.5000 mg | Freq: Two times a day (BID) | RESPIRATORY_TRACT | Status: DC
  Administered 2023-06-14 – 2023-06-21 (×13): 0.5 mg via RESPIRATORY_TRACT
  Filled 2023-06-14 (×14): qty 2

## 2023-06-14 MED ORDER — INSULIN ASPART 100 UNIT/ML IJ SOLN
0.0000 [IU] | INTRAMUSCULAR | Status: DC
  Administered 2023-06-14: 4 [IU] via SUBCUTANEOUS
  Administered 2023-06-15 (×3): 3 [IU] via SUBCUTANEOUS
  Administered 2023-06-15: 7 [IU] via SUBCUTANEOUS
  Administered 2023-06-15 – 2023-06-16 (×5): 4 [IU] via SUBCUTANEOUS
  Administered 2023-06-16 – 2023-06-17 (×2): 3 [IU] via SUBCUTANEOUS
  Filled 2023-06-14 (×13): qty 1

## 2023-06-14 MED ORDER — VANCOMYCIN HCL IN DEXTROSE 1-5 GM/200ML-% IV SOLN
1000.0000 mg | Freq: Two times a day (BID) | INTRAVENOUS | Status: DC
  Administered 2023-06-14 – 2023-06-15 (×2): 1000 mg via INTRAVENOUS
  Filled 2023-06-14 (×2): qty 200

## 2023-06-14 MED ORDER — CHLORHEXIDINE GLUCONATE CLOTH 2 % EX PADS
6.0000 | MEDICATED_PAD | Freq: Every day | CUTANEOUS | Status: DC
  Administered 2023-06-14 – 2023-06-20 (×7): 6 via TOPICAL

## 2023-06-14 MED ORDER — MAGIC MOUTHWASH W/LIDOCAINE
5.0000 mL | Freq: Four times a day (QID) | ORAL | Status: DC
  Administered 2023-06-14 – 2023-06-20 (×25): 5 mL via ORAL
  Filled 2023-06-14 (×31): qty 5

## 2023-06-14 MED ORDER — POTASSIUM CHLORIDE 10 MEQ/100ML IV SOLN
10.0000 meq | INTRAVENOUS | Status: AC
  Administered 2023-06-14 (×2): 10 meq via INTRAVENOUS
  Filled 2023-06-14 (×2): qty 100

## 2023-06-14 MED ORDER — ORAL CARE MOUTH RINSE: 15.0000 mL | OROMUCOSAL | Status: DC | PRN

## 2023-06-14 NOTE — Consult Note (Signed)
Palliative Medicine [facility] at [facility] [phone]) [phone] Fax:([phone]   Name: Charles Hickman Date: 06/14/2023 MRN: [id number]  DOB: 1961/09/03  Patient Care Team: Dorothey Baseman, MD as PCP - General (Family Medicine) Mariah Milling, Tollie Pizza, MD as PCP - Cardiology (Cardiology) Glory Buff, RN as Oncology Nurse Navigator    REASON FOR CONSULTATION: Charles Hickman is a 62 y.o. 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 Patient also has malignant pleural effusion and is status post Pleurx catheter.  Patient is status post recent pathologic fracture of the left hip after transfer from from wheelchair to the car.  He underwent arthroplasty  at [facility] on 05/21/2023.  He is now admitted 06/13/2023 to the hospital with acute on chronic respiratory failure.  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  He was referred to palliative care to address goals and manage ongoing symptoms.  SOCIAL HISTORY:     reports that he quit smoking about 5 weeks ago. His smoking use included cigarettes. He smoked an average of .5 packs per day. He has never used smokeless tobacco. He reports current alcohol use. He reports that he does not use drugs.  Patient is married and lives at home with his wife.  He has a son who lives nearby.  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   ADVANCE DIRECTIVES:  Does not have   CODE STATUS: Full code  PAST MEDICAL HISTORY: Past Medical History:  Diagnosis Date   Abnormal EKG 11/15/2017   Abnormal stress test    Atrial fibrillation (HCC)    CAD in native artery    a. Myoview 12/18: mod sized region of mild ischemia in the mid to apical inferior wall c/w peri-infarct ischemia, large  region of HK of the inf wall, EF 34%, EKG NSR w/ old inf MI. No EKG changes concerning for ischemia at peak stress or in recovery. Mod to high risk scan; b. LHC 12/15/17: pLAD 75% w/ FFR 0.74 s/p PCI/DES, mRCA 90%, CTO dRCA w/ L-R colatts, EF 45-50%, inf HK    Diabetes mellitus (HCC)    HCV (hepatitis C virus)    HLD (hyperlipidemia)    Hypertension    Large cell lymphoma (HCC)    a. s/p 6 cycles of R-CHOP   Peripheral neuropathy    Stroke [facility])    Systolic dysfunction    a. TTE 8/16: EF > 55%, aortic sclerosis, normal RV systolic function; b. LHC 12/15/17: EF 45-50%, inf HK    PAST SURGICAL HISTORY:  Past Surgical History:  Procedure Laterality Date   APPENDECTOMY     CORONARY PRESSURE/FFR STUDY N/A 12/15/2017   Procedure: INTRAVASCULAR PRESSURE WIRE/FFR STUDY;  Surgeon: Iran Ouch, MD;  Location: [facility] INVASIVE CV LAB;  Service: Cardiovascular;  Laterality: N/A;   IR GUIDED DRAIN W CATHETER PLACEMENT  05/02/2023   IR IMAGING GUIDED PORT INSERTION  05/02/2023   IR RADIOLOGIST EVAL & MGMT  05/10/2023   IR US GUIDE BX ASP/DRAIN  05/02/2023   LEFT HEART CATH AND CORONARY ANGIOGRAPHY N/A 12/15/2017   Procedure: LEFT HEART CATH AND CORONARY ANGIOGRAPHY;  Surgeon: Iran Ouch, MD;  Location: [facility] INVASIVE CV LAB;  Service: Cardiovascular;  Laterality: N/A;   TONSILLECTOMY AND ADENOIDECTOMY      HEMATOLOGY/ONCOLOGY HISTORY:  Oncology  History  Adenocarcinoma, lung, right (HCC)  04/28/2023 Initial Diagnosis   Adenocarcinoma, lung, unspecified laterality (HCC)   04/28/2023 Cancer Staging   Staging form: Lung, AJCC 8th Edition - Clinical stage from 04/28/2023: Stage IVB (cTX, cNX, pM1c) - Signed by Jeralyn Ruths, MD on 04/28/2023 Stage prefix: Initial diagnosis   05/06/2023 -  Chemotherapy   Patient is on Treatment Plan : LUNG NSCLC Carboplatin + Paclitaxel + Bevacizumab q21d       ALLERGIES:  has No Known Allergies.  MEDICATIONS:  Current Facility-Administered Medications   Medication Dose Route Frequency Provider Last Rate Last Admin   0.9 %  sodium chloride infusion   Intravenous Continuous Gertha Calkin, MD 50 mL/hr at 06/13/23 2119 New Bag at 06/13/23 2119   acetaminophen (TYLENOL) tablet 650 mg  650 mg Oral Q6H PRN Gertha Calkin, MD       Or   acetaminophen (TYLENOL) suppository 650 mg  650 mg Rectal Q6H PRN Gertha Calkin, MD       apixaban (ELIQUIS) tablet 5 mg  5 mg Oral BID Irena Cords V, MD   5 mg at 06/14/23 0943   ceFEPIme (MAXIPIME) 2 g in sodium chloride 0.9 % 100 mL IVPB  2 g Intravenous Q8H Sophie, Quiles, RPH   Stopped at 06/14/23 1020   diltiazem (CARDIZEM) injection 10 mg  10 mg Intravenous Once Enedina Finner, MD       feeding supplement (ENSURE ENLIVE / ENSURE PLUS) liquid 237 mL  237 mL Oral BID BM Enedina Finner, MD   237 mL at 06/14/23 1405   fluconazole (DIFLUCAN) tablet 100 mg  100 mg Oral Daily Gertha Calkin, MD   100 mg at 06/14/23 0960   magic mouthwash w/lidocaine  5 mL Oral QID Enedina Finner, MD   5 mL at 06/14/23 1333   methylPREDNISolone sodium succinate (SOLU-MEDROL) 40 mg/mL injection 40 mg  40 mg Intravenous Q12H Irena Cords V, MD   40 mg at 06/14/23 0845   morphine (PF) 2 MG/ML injection 2 mg  2 mg Intravenous Q4H PRN Gertha Calkin, MD   2 mg at 06/14/23 1342   nicotine (NICODERM CQ - dosed in mg/24 hours) patch 14 mg  14 mg Transdermal Daily Irena Cords V, MD       rosuvastatin (CRESTOR) tablet 10 mg  10 mg Oral Daily Barrie Folk, RPH   10 mg at 06/14/23 0944   sodium chloride flush (NS) 0.9 % injection 3 mL  3 mL Intravenous Q12H Gertha Calkin, MD   3 mL at 06/13/23 2122   Current Outpatient Medications  Medication Sig Dispense Refill   calcium carbonate ([employer] CALCIUM) 600 MG tablet Take 2 tablets by mouth daily.     acetaminophen (TYLENOL) 500 MG tablet Take 500 mg by mouth every 6 (six) hours as needed.     apixaban (ELIQUIS) 5 MG TABS tablet TAKE 1 TABLET BY MOUTH TWICE A DAY 180 tablet 1   blood glucose meter kit and  supplies KIT Dispense based on patient and insurance preference. Use 2 times daily as directed. (FOR ICD-10 E11.9). (Patient not taking: Reported on 05/09/2023) 1 each 0   calcium carbonate (CALCIUM 600) 600 MG TABS tablet Take 2 tablets (1,200 mg total) by mouth daily. 90 tablet 2   cholecalciferol (VITAMIN D3) 25 MCG (1000 UNIT) tablet Take 1 tablet (1,000 Units total) by mouth daily. 90 tablet 1   dexamethasone (DECADRON) 2 MG tablet Take 1  tablet (2 mg total) by mouth daily. 30 tablet 1   diltiazem (CARDIZEM) 30 MG tablet Take 1 tablet (30 mg total) by mouth daily as needed (for fast heart rate). (Patient not taking: Reported on 06/06/2023) 30 tablet 11   fentaNYL (DURAGESIC) 25 MCG/HR Place 1 patch onto the skin every 3 (three) days. 10 patch 0   fluconazole (DIFLUCAN) 100 MG tablet Take 1 tablet (100 mg total) by mouth daily. 10 tablet 0   JARDIANCE 25 MG TABS tablet TAKE 1 TABLET BY MOUTH EVERY DAY 90 tablet 2   lidocaine-prilocaine (EMLA) cream Apply to affected area once 30 g 3   metoprolol succinate (TOPROL-XL) 100 MG 24 hr tablet Take 1 tablet (100 mg total) by mouth daily. TAKE WITH OR IMMEDIATELY FOLLOWING A MEAL. 90 tablet 1   mirtazapine (REMERON) 7.5 MG tablet Take 1 tablet (7.5 mg total) by mouth at bedtime. For mood, appetite, and sleep (Patient not taking: Reported on 06/06/2023) 30 tablet 1   Multiple Vitamin (MULTI-VITAMIN) tablet Take 1 tablet by mouth daily.     naloxone (NARCAN) nasal spray 4 mg/0.1 mL See admin instructions. (Patient not taking: Reported on 06/06/2023)     nitroGLYCERIN (NITROSTAT) 0.4 MG SL tablet Place 1 tablet (0.4 mg total) under the tongue every 5 (five) minutes as needed for chest pain. (Patient not taking: Reported on 06/06/2023) 25 tablet 3   nystatin (MYCOSTATIN) 100000 UNIT/ML suspension Take 5 mLs (500,000 Units total) by mouth 4 (four) times daily. Swish and spit. 473 mL 0   ondansetron (ZOFRAN) 8 MG tablet Take 1 tablet (8 mg total) by mouth every 8  (eight) hours as needed for nausea or vomiting. Start on the third day after carboplatin. (Patient not taking: Reported on 06/06/2023) 60 tablet 2   ONETOUCH VERIO test strip USE TWICE A DAY AS DIRECTED (Patient not taking: Reported on 05/09/2023) 100 strip 0   oxyCODONE (OXY IR/ROXICODONE) 5 MG immediate release tablet Take 1 tablet (5 mg total) by mouth every 6 (six) hours as needed for severe pain. 60 tablet 0   prochlorperazine (COMPAZINE) 10 MG tablet Take 1 tablet (10 mg total) by mouth every 6 (six) hours as needed for nausea or vomiting. 60 tablet 2   rosuvastatin (CRESTOR) 10 MG tablet TAKE 1 TABLET BY MOUTH EVERY DAY 90 tablet 0    VITAL SIGNS: BP (!) 81/60   Pulse (!) 161   Temp (!) 97.5 F (36.4 C) (Oral)   Resp (!) 24   Ht 5\' 10"  (1.778 m)   Wt 147 lb 14.9 oz (67.1 kg)   SpO2 99%   BMI 21.23 kg/m  Filed Weights   06/13/23 1636  Weight: 147 lb 14.9 oz (67.1 kg)    Estimated body mass index is 21.23 kg/m as calculated from the following:   Height as of this encounter: 5\' 10"  (1.778 m).   Weight as of this encounter: 147 lb 14.9 oz (67.1 kg).  LABS: CBC:    Component Value Date/Time   WBC 8.3 06/14/2023 0547   HGB 10.9 (L) 06/14/2023 0547   HGB 13.5 06/03/2023 1028   HGB 17.4 12/11/2020 1340   HCT 33.5 (L) 06/14/2023 0547   HCT 49.7 12/11/2020 1340   PLT 75 (L) 06/14/2023 0547   PLT 214 06/03/2023 1028   PLT 200 12/11/2020 1340   MCV 87.2 06/14/2023 0547   MCV 88 12/11/2020 1340   MCV 88 10/25/2013 0247   NEUTROABS 8.4 (H) 06/13/2023 1640  NEUTROABS 3.6 12/07/2017 0839   LYMPHSABS 0.3 (L) 06/13/2023 1640   LYMPHSABS 1.9 12/07/2017 0839   MONOABS 0.9 06/13/2023 1640   EOSABS 0.0 06/13/2023 1640   EOSABS 0.1 12/07/2017 0839   BASOSABS 0.0 06/13/2023 1640   BASOSABS 0.0 12/07/2017 0839   Comprehensive Metabolic Panel:    Component Value Date/Time   NA 134 (L) 06/14/2023 0547   NA 140 12/11/2020 1340   NA 138 10/25/2013 0247   K 3.6 06/14/2023 0547   K  4.1 10/25/2013 0247   CL 104 06/14/2023 0547   CL 106 10/25/2013 0247   CO2 14 (L) 06/14/2023 0547   CO2 27 10/25/2013 0247   BUN 23 06/14/2023 0547   BUN 15 12/11/2020 1340   BUN 20 (H) 10/25/2013 0247   CREATININE 0.57 (L) 06/14/2023 0547   CREATININE 0.69 06/03/2023 1026   CREATININE 1.41 (H) 10/25/2013 0247   GLUCOSE 131 (H) 06/14/2023 0547   GLUCOSE 182 (H) 10/25/2013 0247   CALCIUM 7.6 (L) 06/14/2023 0547   CALCIUM 9.2 10/25/2013 0247   AST 17 06/14/2023 0547   AST 21 06/03/2023 1026   ALT 16 06/14/2023 0547   ALT 29 06/03/2023 1026   ALT 40 10/25/2013 0247   ALKPHOS 103 06/14/2023 0547   ALKPHOS 82 10/25/2013 0247   BILITOT 1.6 (H) 06/14/2023 0547   BILITOT 0.8 06/03/2023 1026   PROT 4.9 (L) 06/14/2023 0547   PROT 6.5 12/07/2017 0839   PROT 7.3 10/25/2013 0247   ALBUMIN 2.1 (L) 06/14/2023 0547   ALBUMIN 4.5 12/07/2017 0839   ALBUMIN 4.2 10/25/2013 0247    RADIOGRAPHIC STUDIES: CT Angio Chest Pulmonary Embolism (PE) W or WO Contrast  Result Date: 06/13/2023 CLINICAL DATA:  Past medical history of stage IV adenocarcinoma of the lung with metastases to the bone admitted on 06/13/2023 with pneumonia. Pleural effusion, known or suspected. PE suspected EXAM: CT ANGIOGRAPHY CHEST WITH CONTRAST TECHNIQUE: Multidetector CT imaging of the chest was performed using the standard protocol during bolus administration of intravenous contrast. Multiplanar CT image reconstructions and MIPs were obtained to evaluate the vascular anatomy. RADIATION DOSE REDUCTION: This exam was performed according to the departmental dose-optimization program which includes automated exposure control, adjustment of the mA and/or kV according to patient size and/or use of iterative reconstruction technique. CONTRAST:  75mL OMNIPAQUE IOHEXOL 350 MG/ML SOLN COMPARISON:  Chest radiographs 06/13/2023 and PET/CT 04/19/2023 FINDINGS: Cardiovascular: No acute pulmonary embolism. There is abrupt tapering of the right  middle lobe pulmonary artery similar to 03/31/2023. Normal heart size. No pericardial effusion. Coronary artery and aortic atherosclerotic calcification. Right chest wall Port-A-Cath. Mediastinum/Nodes: Unremarkable esophagus. Right paratracheal lymph nodes measuring 12 (4/35) and 13 mm (4/44), respectively. Prevascular node on 4/39 measures 8 mm. Left supraclavicular lymphadenopathy measuring up 10 mm on 04/10. These are similar to 04/19/2023. Lungs/Pleura: Layering fluid in the esophagus extending into the right mainstem bronchus which is completely opacified. Complete atelectasis of the right lung. Large right pleural effusion. There is some ill-defined pleural nodularity similar to PET/CT 04/19/2023. Right chest tube. Moderate left pleural effusion associated atelectasis. Diffuse nodular interlobular septal thickening and bronchial wall thickening in the left lung compatible with lymphangitic carcinomatosis. Upper Abdomen: No acute abnormality. 2.2 cm mass in the left adrenal gland similar to prior and compatible with metastasis. Musculoskeletal: Multiple vertebral body osseous metastases. Left fifth rib osseous metastasis anteriorly. Review of the MIP images confirms the above findings. IMPRESSION: 1. No acute pulmonary embolism. Abrupt tapering of the right middle lobe  pulmonary artery similar to 03/31/2023. 2. Complete atelectasis of the right lung. Large right pleural effusion with ill-defined pleural nodularity similar to PET/CT 04/19/2023. 3. Moderate left pleural effusion with associated atelectasis. 4. Diffuse nodular interlobular septal thickening and bronchial wall thickening in the left lung compatible with lymphangitic carcinomatosis. 5. Layering fluid in the lower trachea incompletely occluding the right mainstem bronchus. 6. Osseous, mediastinal lymph node, and left adrenal gland metastases Aortic Atherosclerosis (ICD10-I70.0). Electronically Signed   By: Minerva Fester M.D.   On: 06/13/2023 22:39    DG Chest Port 1 View  Result Date: 06/13/2023 CLINICAL DATA:  Shortness of breath and increasing weakness EXAM: PORTABLE CHEST 1 VIEW COMPARISON:  Chest radiograph dated 05/18/2023 FINDINGS: Right chest wall port tip projects over the superior cavoatrial junction. Right pleural catheter tip projects over the right apex. Similar asymmetric opacification of the right lung with volume loss resulting in rightward mediastinal shift. Similar interstitial and nodular opacities of the left lung. Presumed large left pleural effusion. Heart Johanthan Kneeland are obscured. No acute osseous abnormality. IMPRESSION: 1. Similar asymmetric opacification of the right lung with volume loss resulting in rightward mediastinal shift. 2. Similar interstitial and nodular opacities of the left lung. 3. Presumed large left pleural effusion. Electronically Signed   By: Agustin Cree M.D.   On: 06/13/2023 17:30   US Venous Img Upper Uni Left  Result Date: 06/07/2023 CLINICAL DATA:  Left upper extremity edema. EXAM: LEFT UPPER EXTREMITY VENOUS DOPPLER ULTRASOUND TECHNIQUE: Gray-scale sonography with graded compression, as well as color Doppler and duplex ultrasound were performed to evaluate the upper extremity deep venous system from the level of the subclavian vein and including the jugular, axillary, basilic, radial, ulnar and upper cephalic vein. Spectral Doppler was utilized to evaluate flow at rest and with distal augmentation maneuvers. COMPARISON:  None Available. FINDINGS: Contralateral Subclavian Vein: Respiratory phasicity is normal and symmetric with the symptomatic side. No evidence of thrombus. Normal compressibility. Internal Jugular Vein: No evidence of thrombus. Normal compressibility, respiratory phasicity and response to augmentation. Subclavian Vein: No evidence of thrombus. Normal compressibility, respiratory phasicity and response to augmentation. Axillary Vein: No evidence of thrombus. Normal compressibility, respiratory  phasicity and response to augmentation. Cephalic Vein: No evidence of thrombus. Normal compressibility, respiratory phasicity and response to augmentation. Basilic Vein: No evidence of thrombus. Normal compressibility, respiratory phasicity and response to augmentation. Brachial Veins: No evidence of thrombus. Normal compressibility, respiratory phasicity and response to augmentation. Radial Veins: No evidence of thrombus. Normal compressibility, respiratory phasicity and response to augmentation. Ulnar Veins: No evidence of thrombus. Normal compressibility, respiratory phasicity and response to augmentation. Venous Reflux:  None visualized. Other Findings:  None visualized. IMPRESSION: No evidence of DVT within the LEFT upper extremity. Electronically Signed   By: Aram Candela M.D.   On: 06/07/2023 18:32   DG Chest Portable 1 View  Result Date: 05/18/2023 CLINICAL DATA:  Preop evaluation for hip surgery EXAM: PORTABLE CHEST 1 VIEW COMPARISON:  05/10/2023 FINDINGS: There is again noted complete opacification of the right hemithorax identified. PleurX catheter is noted in place. Right chest wall port is again seen. Stable interstitial opacities are noted throughout the left lung. No new focal abnormality is seen. IMPRESSION: Complete opacification of the right hemithorax similar to that seen on the prior exam. Diffuse stable interstitial changes are noted throughout the left lung. Electronically Signed   By: Alcide Clever M.D.   On: 05/18/2023 17:45   DG HIP UNILAT WITH PELVIS 2-3 VIEWS LEFT  Result  Date: 05/18/2023 CLINICAL DATA:  Left-sided hip pain EXAM: DG HIP (WITH OR WITHOUT PELVIS) 2-3V LEFT COMPARISON:  PET CT 04/19/2023 FINDINGS: SI joints are non widened. Pubic symphysis and rami appear intact. Degenerative changes of both hips. Acute left femoral neck fracture, question lucency extending to the trochanter on frontal view of the pelvis. Underlying heterogenous mineralization corresponding to the  lesion seen on PET CT previously. IMPRESSION: 1. Acute appearing proximal left femur fracture, involves femoral neck and possibly the trochanter, this would be consistent with pathologic fracture. 2. Degenerative changes of both hips. These results will be called to the ordering clinician or representative by the Radiologist Assistant, and communication documented in the PACS or [employer]. Electronically Signed   By: Jasmine Pang M.D.   On: 05/18/2023 15:42    PERFORMANCE STATUS (ECOG) : 2 - Symptomatic, <50% confined to bed  Review of Systems Unless otherwise noted, a complete review of systems is negative.  Physical Exam General: NAD Cardiovascular: regular rate and rhythm Pulmonary: clear ant fields Abdomen: soft, nontender, + bowel sounds GU: no suprapubic tenderness Extremities: no edema, no joint deformities Skin: no rashes Neurological: Weakness but otherwise nonfocal  IMPRESSION: Patient is known to me from the clinic.  He was seen today in the emergency department.  Patient was admitted with acute on chronic respiratory failure with CTA of the chest showing bilateral pleural effusions with a left lung bronchial wall thickening consistent with lymphangitic carcinomatosis.  Patient states that his breathing is slightly improved.  He has right-sided Pleurx, which patient drains daily at home.  Recommend considering therapeutic left sided thoracentesis.  Discussed overall goals with patient.  He recognizes that his cancer treatment is with palliative intent and we discussed the multiple setbacks that he has had thus far in his cancer journey.  He remains committed to pursuing continued chemotherapy and says that he hopes that he can have "more days under the sun."  However, he says he realizes that his future goals may change as his cancer inevitably worsens.  We also discussed the importance of ensuring acceptable quality of life as he pursues cancer treatment.  We discussed  CODE STATUS.  I previously sent patient home with a MOST form.  Patient says that he has tried to talk with his wife some about decision-making and needs to speak with her more about end-of-life planning.  PLAN: -Continue current scope of treatment -Patient plans to speak with his wife about CODE STATUS and decision making -Outpatient follow-up   Time Total: 45 minutes  Visit consisted of counseling and education dealing with the complex and emotionally intense issues of symptom management and palliative care in the setting of serious and potentially life-threatening illness.Greater than 50%  of this time was spent counseling and coordinating care related to the above assessment and plan.  Signed by: Laurette Schimke, PhD, NP-C

## 2023-06-14 NOTE — Progress Notes (Signed)
[facility]  - [place] [facility]   PATIENT NAME: Charles Hickman    MR#:  [id number]  DATE OF BIRTH:  [birth year]  SUBJECTIVE:  no family at bedside during my evaluation. Patient came in with increased ability, fatigue ability unable to get around doing his ADL. Recently started on chemo and radiation for his lung cancer which is metastasized. Patient tells me his last chemo was passed Friday and has felt drained out. He has a right chronic pleurex catheter placement. Awaiting for wife to bring the pleurex kit for drainage to be done. Patient has chronic cough.  VITALS:  Blood pressure (!) 105/92, pulse (!) 129, temperature (!) 97.5 F (36.4 C), temperature source Oral, resp. rate 18, height 5\' 10"  (1.778 m), weight 67.1 kg, SpO2 100 %.  PHYSICAL EXAMINATION:   GENERAL:  62 y.o.-year-old patient with no acute distress. Appears chronically ill, debilitated LUNGS: decreased breath sounds more on right than left  CARDIOVASCULAR: S1, S2 normal. Tachycardia, a fib ABDOMEN: Soft, nontender, nondistended. Bowel sounds present.  EXTREMITIES: trace edema b/l.    NEUROLOGIC: nonfocal  patient is alert and awake SKIN: per Rn  LABORATORY PANEL:  CBC Recent Labs  Lab 06/14/23 0547  WBC 8.3  HGB 10.9*  HCT 33.5*  PLT 75*    Chemistries  Recent Labs  Lab 06/14/23 0547  NA 134*  K 3.6  CL 104  CO2 14*  GLUCOSE 131*  BUN 23  CREATININE 0.57*  CALCIUM 7.6*  AST 17  ALT 16  ALKPHOS 103  BILITOT 1.6*   Cardiac Enzymes No results for input(s): "TROPONINI" in the last 168 hours. 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 Pleural effusion, known or suspected. PE suspected EXAM: CT ANGIOGRAPHY CHEST WITH CONTRAST TECHNIQUE: Multidetector CT imaging of the chest was performed  using the standard protocol during bolus administration of intravenous contrast. Multiplanar CT image reconstructions and MIPs were obtained to evaluate the vascular anatomy. RADIATION DOSE REDUCTION: This exam was performed according to the departmental dose-optimization program which includes automated exposure control, adjustment of the mA and/or kV according to patient size and/or use of iterative reconstruction technique. CONTRAST:  75mL OMNIPAQUE IOHEXOL 350 MG/ML SOLN COMPARISON:  Chest radiographs 06/13/2023 and PET/CT 04/19/2023 FINDINGS: Cardiovascular: No acute pulmonary embolism. There is abrupt tapering of the right middle lobe pulmonary artery similar to 03/31/2023. Normal heart size. No pericardial effusion. Coronary artery and aortic atherosclerotic calcification. Right chest wall Port-A-Cath. Mediastinum/Nodes: Unremarkable esophagus. Right paratracheal lymph nodes measuring 12 (4/35) and 13 mm (4/44), respectively. Prevascular node on 4/39 measures 8 mm. Left supraclavicular lymphadenopathy measuring up 10 mm on 04/10. These are similar to 04/19/2023. Lungs/Pleura: Layering fluid in the esophagus extending into the right mainstem bronchus which is completely opacified. Complete atelectasis of the right lung. Large right pleural effusion. There is some ill-defined pleural nodularity similar to PET/CT 04/19/2023. Right chest tube. Moderate left pleural effusion associated atelectasis. Diffuse nodular interlobular septal thickening and bronchial wall thickening in the left lung compatible with lymphangitic carcinomatosis. Upper Abdomen: No acute abnormality. 2.2 cm mass in the left adrenal gland similar to prior and compatible with metastasis. Musculoskeletal: Multiple vertebral body osseous metastases. Left fifth rib osseous metastasis anteriorly. Review of the MIP images confirms the above findings. IMPRESSION: 1. No acute pulmonary embolism. Abrupt tapering  of the right middle lobe pulmonary artery  similar to 03/31/2023. 2. Complete atelectasis of the right lung. Large right pleural effusion with ill-defined pleural nodularity similar to PET/CT 04/19/2023. 3. Moderate left pleural effusion with associated atelectasis. 4. Diffuse nodular interlobular septal thickening and bronchial wall thickening in the left lung compatible with lymphangitic carcinomatosis. 5. Layering fluid in the lower trachea incompletely occluding the right mainstem bronchus. 6. Osseous, mediastinal lymph node, and left adrenal gland metastases Aortic Atherosclerosis (ICD10-I70.0). Electronically Signed   By: Minerva Fester M.D.   On: 06/13/2023 22:39   DG Chest Port 1 View  Result Date: 06/13/2023 CLINICAL DATA:  Shortness of breath and increasing weakness EXAM: PORTABLE CHEST 1 VIEW COMPARISON:  Chest radiograph dated 05/18/2023 FINDINGS: Right chest wall port tip projects over the superior cavoatrial junction. Right pleural catheter tip projects over the right apex. Similar asymmetric opacification of the right lung with volume loss resulting in rightward mediastinal shift. Similar interstitial and nodular opacities of the left lung. Presumed large left pleural effusion. Heart borders are obscured. No acute osseous abnormality. IMPRESSION: 1. Similar asymmetric opacification of the right lung with volume loss resulting in rightward mediastinal shift. 2. Similar interstitial and nodular opacities of the left lung. 3. Presumed large left pleural effusion. Electronically Signed   By: Agustin Cree M.D.   On: 06/13/2023 17:30    Assessment and Plan  Charles Hickman is a 62 y.o. 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  Particularly having shortness of breath with ambulation.   Acute on chronic hypoxic respiratory failure secondary to chronic right lung atelectasis with recurrent pleural effusion and stage IV adenocarcinoma lung with skeletal metastasis with history of COPD on chronic home oxygen -- continue IV steroids -- continue IV empiric antibiotic -- will get right pleural drainage on daily basis -- consider IR to drain left lung pleural effusion if symptoms do not improve -- patient has very poor lung reserve. Continue nebs and PRN inhalers  Metabolic acidosis -- will give bicarb  A fib with RVR acute on chronic in the setting of respiratory failure hypotension -- will start patient on amiodarone drip. -- received IV 10 mg diltiazem after patient went into a fib working with PT. -- Start patient on levophed gtt -- this was discussed with Dr. Belia Heman to see patient --consider cardiology consult if needed --cont eliquis  Overall generalized ability, deconditioning, fatigue ability, poor PO intake -- PT OT to see patient -- patient seen by palliative care -- dietitian to see patient  Right sided large pleural effusion/total atelectasis/white out chest x-ray -- patient has pleurx catheter--wife to bring and will consider drainage qd . Anemia of chronic disease -- hemoglobin stable. Patient followed by hematology oncology  Type II diabetes -- continue sliding scale  Chronic pain due to cancer -- PRN pain meds  Thrombocytopenia -- continue to monitor. No active bleeding. Patient not eliquis   Procedures: Family communication : spoke with wife Radek Carnero Consults : ICU, palliative, oncology CODE STATUS: full DVT Prophylaxis : eliquis Level of care: Telemetry Cardiac Status is: Inpatient Remains inpatient appropriate because: a fib RVR, acute on chronic hypoxic respiratory failure    TOTAL TIME TAKING  CARE OF THIS PATIENT: 35 minutes.  >50% time spent on  counselling and coordination of care  Note: This dictation was prepared with Dragon dictation along with smaller phrase technology. Any transcriptional errors that result from this process are unintentional.  Enedina Finner M.D    [facility]   CC: Primary care physician; Dorothey Baseman, MD

## 2023-06-14 NOTE — Progress Notes (Deleted)
Cardiology Office Note:    Date:  06/14/2023   ID:  Charles Hickman, DOB 1961/07/10, MRN [id number]  PCP:  Dorothey Baseman, MD  [facility] Cardiologist:  Julien Nordmann, MD  [facility] Electrophysiologist:  None   Referring MD: Dorothey Baseman, MD   Chief Complaint: ***  History of Present Illness:    Charles Hickman is a 62 y.o. male with a hx of ***  Past Medical History:  Diagnosis Date   Abnormal EKG 11/15/2017   Abnormal stress test    Atrial fibrillation (HCC)    CAD in native artery    a. 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 No EKG changes concerning for ischemia at peak stress or in recovery. Mod to high risk scan; b. LHC 12/15/17: pLAD 75% w/ FFR 0.74 s/p PCI/DES, mRCA 90%, CTO dRCA w/ L-R colatts, EF 45-50%, inf HK    Diabetes mellitus (HCC)    HCV (hepatitis C virus)    HLD (hyperlipidemia)    Hypertension    Large cell lymphoma (HCC)    a. s/p 6 cycles of R-CHOP   Peripheral neuropathy    Stroke [facility])    Systolic dysfunction    a. TTE 8/16: EF > 55%, aortic sclerosis, normal RV systolic function; b. LHC 12/15/17: EF 45-50%, inf HK    Past Surgical History:  Procedure Laterality Date   APPENDECTOMY     CORONARY PRESSURE/FFR STUDY N/A 12/15/2017   Procedure: INTRAVASCULAR PRESSURE WIRE/FFR STUDY;  Surgeon: Iran Ouch, MD;  Location: [facility] INVASIVE CV LAB;  Service: Cardiovascular;  Laterality: N/A;   IR GUIDED DRAIN W CATHETER PLACEMENT  05/02/2023   IR IMAGING GUIDED PORT INSERTION  05/02/2023   IR RADIOLOGIST EVAL & MGMT  05/10/2023   IR US GUIDE BX ASP/DRAIN  05/02/2023   LEFT HEART CATH AND CORONARY ANGIOGRAPHY N/A 12/15/2017   Procedure: LEFT HEART CATH AND CORONARY ANGIOGRAPHY;  Surgeon: Iran Ouch, MD;  Location: [facility] INVASIVE CV LAB;  Service: Cardiovascular;  Laterality: N/A;   TONSILLECTOMY AND  ADENOIDECTOMY      Current Medications: No outpatient medications have been marked as taking for the 06/14/23 encounter (Appointment) with Fransico Michael, Amrita Radu H, PA-C.     Allergies:   Patient has no known allergies.   Social History   Socioeconomic History   Marital status: Married    Spouse name: Not on file   Number of children: Not on file   Years of education: Not on file   Highest education level: Not on file  Occupational History   Not on file  Tobacco Use   Smoking status: Former    Packs/day: .5    Types: Cigarettes    Quit date: 05/04/2023    Years since quitting: 0.1   Smokeless tobacco: Never   Tobacco comments:    Down to a couple of cigarettes a day  Substance and Sexual Activity   Alcohol use: Yes    Comment: occasional   Drug use: No   Sexual activity: Not on file  Other Topics Concern   Not on file  Social History Narrative   Not on file   Social Determinants of Health   Financial Resource Strain: Low Risk  (04/06/2023)   Overall Financial Resource Strain (CARDIA)    Difficulty of Paying Living Expenses: Not hard at all  Food Insecurity: No Food Insecurity (05/10/2023)   Hunger Vital Sign    Worried About Running Out of Food in the Last Year: Never true    Ran Out of Food in the Last Year: Never true  Transportation Needs: No Transportation Needs (05/10/2023)   PRAPARE - Administrator, Civil Service (Medical): No    Lack of Transportation (Non-Medical): No  Physical Activity: Not on file  Stress: Not on file  Social Connections: Not on file     Family History: The patient's ***family history includes Dementia in his maternal grandmother; Diabetes in his father and mother; Hyperlipidemia in his mother; Hypertension in his father and mother; Kidney disease in his father; Stroke in his mother.  ROS:   Please see the history of present illness.    *** All other systems reviewed and are negative.  EKGs/Labs/Other Studies Reviewed:    The  following studies were reviewed today: ***  EKG:  EKG is *** ordered today.  The ekg ordered today demonstrates ***  Recent Labs: 05/09/2023: Magnesium 2.3; TSH 0.917 06/13/2023: B Natriuretic Peptide 586.1 06/14/2023: ALT 16; BUN 23; Creatinine, Ser 0.57; Hemoglobin 10.9; Platelets 75; Potassium 3.6; Sodium 134  Recent Lipid Panel    Component Value Date/Time   CHOL 153 09/26/2020 0847   CHOL 216 (H) 10/05/2017 0853   TRIG 297.0 (H) 09/26/2020 0847   HDL 43.20 09/26/2020 0847   HDL 37 (L) 10/05/2017 0853   CHOLHDL 4 09/26/2020 0847   VLDL 59.4 (H) 09/26/2020 0847   LDLCALC 42 02/16/2019 0939   LDLCALC 116 (H) 10/05/2017 0853   LDLDIRECT 60.0 09/26/2020 0847     Risk Assessment/Calculations:   {Does this patient have ATRIAL FIBRILLATION?[phone]}   Physical Exam:    VS:  There were no vitals taken for this visit.    Wt Readings from Last 3 Encounters:  06/13/23 147 lb 14.9 oz (67.1 kg)  06/03/23 148 lb 11.2 oz (67.4 kg)  06/03/23 148 lb (67.1 kg)     GEN: *** Well nourished, well developed in no acute distress HEENT: Normal NECK: No JVD; No carotid bruits LYMPHATICS: No lymphadenopathy CARDIAC: ***RRR, no murmurs, rubs, gallops RESPIRATORY:  Clear to auscultation without rales, wheezing or rhonchi  ABDOMEN: Soft, non-tender, non-distended MUSCULOSKELETAL:  No edema; No deformity  SKIN: Warm and dry NEUROLOGIC:  Alert and oriented x 3 PSYCHIATRIC:  Normal affect   ASSESSMENT:    No diagnosis found. PLAN:    In order of problems listed above:  ***  Disposition: Follow up {follow up:15908} with ***   Shared Decision Making/Informed Consent   {Are you ordering a CV Procedure (e.g. stress test, cath, DCCV, TEE, etc)?   Press F2        :[id number]}    Signed, Erva Koke Ardelle Lesches  06/14/2023 6:55 AM    [facility]

## 2023-06-14 NOTE — Consult Note (Cosign Needed Addendum)
NAME:  [name], MRN:  [id number], DOB:  Jul 15, 1961, LOS: 1 ADMISSION DATE:  06/13/2023, CONSULTATION DATE:  06/14/2023 REFERRING MD:  Dr. Enedina Finner, CHIEF COMPLAINT:  Shortness of Breath   Brief Pt Description / Synopsis:  62 y.o. 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  Hospital course complicated by Atrial Fibrillation w/ RVR with associated hypotension.  History of Present Illness:  Charles Hickman is a 62 y.o. male with a past medical history significant for A-fib, CAD, Hypertension, HFrEF, diabetes, hepatitis C, Stage IV Adenocarcinoma of the lung with mets to the bone, large cell lymphoma, recurrent pleural effusions, COPD on 2L supplemental oxygen at baseline, and stroke who presented to [facility] ED on 06/13/23 for evaluation of worsening shortness of breath and O2 saturations as low as 70 at home despite increasing O2.    Over the past few days he has had progressive shortness of breath particularly with ambulation, increased cough, and generalized weakness.   ED Course: Initial Vital Signs: Temperature 98.2 F, BP 104/74, Pulse 94, RR 29, SpO2 94% Significant Labs: hyponatremia of 133 bicarb of 16 glucose 203 normal creatinine anion gap of 18 albumin 2.5 total bili of 1.5 AST ALT within normal limits. Elevated BNP of 586.1, lactic acidosis of 3.7, 3 point 1 repeat, leukocytosis of 10.6 anemia of 12.8 platelet count of 87, INR of 2.5, respiratory panel negative for flu COVID. Urinalysis negative for bacteria leukocytes nitrites WBC 0-5.  EKG showed sinus rhythm at 92 PR interval of 141 nonspecific T wave abnormality in the lateral leads QTc of 469  Imaging Chest  X-ray>>IMPRESSION: 1. Similar asymmetric opacification of the right lung with volume loss resulting in rightward mediastinal shift. 2. Similar interstitial and nodular opacities of the left lung. 3. Presumed large left pleural effusion. CTA Chest>>IMPRESSION: 1. No acute pulmonary embolism. Abrupt tapering of the right middle lobe pulmonary artery similar to 03/31/2023. 2. Complete atelectasis of the right lung. Large right pleural effusion with ill-defined pleural nodularity similar to PET/CT 04/19/2023. 3. Moderate left pleural effusion with associated atelectasis. 4. Diffuse nodular interlobular septal thickening and bronchial wall thickening in the left lung compatible with lymphangitic carcinomatosis. 5. Layering fluid in the lower trachea incompletely occluding the right mainstem bronchus. 6. Osseous, mediastinal lymph node, and left adrenal gland metastases Medications Administered: 1L NS bolus, IV Cefepime/Azithromycin/Vancomycin  He met Sepsis criteria with concern for pneumonia, therefore blood cultures and broad spectrum antibiotics given.  Hospitalist were asked to admit for further workup and treatment.  On 06/14/23 while boarding in the ED he developed atrial fibrillation with RVR and was given Cardizem x2, then developed hypotension.  PCCM consulted.  Please see "Significant Hospital Events" section below for full detailed hospital course.   Pertinent  Medical History   Past Medical History:  Diagnosis Date   Abnormal EKG 11/15/2017   Abnormal stress test    Atrial fibrillation (HCC)    CAD in native artery    a. 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 No EKG changes concerning for ischemia at peak stress or  in recovery. Mod to high risk scan; b. LHC 12/15/17: pLAD 75% w/ FFR 0.74 s/p PCI/DES, mRCA 90%, CTO dRCA w/ L-R colatts, EF  45-50%, inf HK    Diabetes mellitus (HCC)    HCV (hepatitis C virus)    HLD (hyperlipidemia)    Hypertension    Large cell lymphoma (HCC)    a. s/p 6 cycles of R-CHOP   Peripheral neuropathy    Stroke [facility])    Systolic dysfunction    a. TTE 8/16: EF > 55%, aortic sclerosis, normal RV systolic function; b. LHC 12/15/17: EF 45-50%, inf HK    Micro Data:  6/24: COVID/RSV/FLU PCR>>negative 6/24: RVP>> 6/24: Blood cultures x2>> no growth to date 6/25: MRSA PCR>>negative 6/25: Strep pneumo urinary antigen>> 6/25: Legionella urinary antigen>> 6/25: Pleural fluid>> 6/25: Sputum>>  Antimicrobials:   Anti-infectives (From admission, onward)    Start     Dose/Rate Route Frequency Ordered Stop   06/14/23 1800  azithromycin (ZITHROMAX) 500 mg in sodium chloride 0.9 % 250 mL IVPB  Status:  Discontinued        500 mg 250 mL/hr over 60 Minutes Intravenous Every 24 hours 06/13/23 2055 06/14/23 1450   06/14/23 1800  vancomycin (VANCOCIN) IVPB 1000 mg/200 mL premix        1,000 mg 200 mL/hr over 60 Minutes Intravenous Every 12 hours 06/14/23 1627     06/14/23 0600  vancomycin (VANCOCIN) IVPB 1000 mg/200 mL premix  Status:  Discontinued        1,000 mg 200 mL/hr over 60 Minutes Intravenous Every 12 hours 06/13/23 2106 06/14/23 1128   06/14/23 0200  ceFEPIme (MAXIPIME) 2 g in sodium chloride 0.9 % 100 mL IVPB        2 g 200 mL/hr over 30 Minutes Intravenous Every 8 hours 06/13/23 2106     06/13/23 2300  fluconazole (DIFLUCAN) tablet 100 mg        100 mg Oral Daily 06/13/23 2136 06/16/23 0959   06/13/23 2145  fluconazole (DIFLUCAN) tablet 100 mg  Status:  Discontinued        100 mg Oral Daily 06/13/23 2136 06/13/23 2136   06/13/23 1815  vancomycin (VANCOREADY) IVPB 1250 mg/250 mL        1,250 mg 166.7 mL/hr over 90 Minutes Intravenous  Once 06/13/23 1801 06/13/23 2108   06/13/23 1800  vancomycin (VANCOCIN) IVPB 1000 mg/200 mL premix  Status:  Discontinued        1,000 mg 200 mL/hr over 60  Minutes Intravenous  Once 06/13/23 1757 06/13/23 1801   06/13/23 1800  ceFEPIme (MAXIPIME) 2 g in sodium chloride 0.9 % 100 mL IVPB        2 g 200 mL/hr over 30 Minutes Intravenous  Once 06/13/23 1757 06/13/23 1905   06/13/23 1800  azithromycin (ZITHROMAX) 500 mg in sodium chloride 0.9 % 250 mL IVPB        500 mg 250 mL/hr over 60 Minutes Intravenous  Once 06/13/23 1757 06/13/23 1953        Significant Hospital Events: Including procedures, antibiotic start and stop dates in addition to other pertinent events   6/24: Admitted by [facility]. 6/25: Developed A.fib w/ RVR, became hypotensive following Cardizem x2.  PCCM consulted.  Interim History / Subjective:  -Patient seen and evaluated in the ICU -Currently on amiodarone infusion with heart rate in the 120s to 130s, on 5 mics of Levophed with systolic blood pressure in the low 100s -Awake and alert, on 3 L  nasal cannula in no acute distress -Reports feeling extremely fatigued and weak, shortness of breath has improved since admission  Objective   Blood pressure (!) 88/64, pulse (!) 150, temperature (!) 97.5 F (36.4 C), temperature source Oral, resp. rate 19, height 5\' 10"  (1.778 m), weight 67.1 kg, SpO2 100 %.        Intake/Output Summary (Last 24 hours) at 06/14/2023 1621 Last data filed at 06/14/2023 1020 Gross per 24 hour  Intake 2600 ml  Output 380 ml  Net 2220 ml   Filed Weights   06/13/23 1636  Weight: 67.1 kg    Examination: General: Acute on chronically ill-appearing frail male, laying in bed, on 3 L nasal cannula, no acute distress HENT: Atraumatic, normocephalic, neck supple, no JVD, dry mucous membranes with ulcerations in the roof of his mouth and medial aspect of tongue Lungs: Clear breath sounds to the left, diminished on the right side, even, nonlabored Cardiovascular: Tachycardia, irregular irregular rhythm, no murmurs, rubs, gallops Abdomen: Soft, nontender, nondistended, no guarding or rebound tenderness,  bowel sounds positive x 4 Extremities: Generalized weakness, no deformities, no edema Neuro: Awake and alert, oriented x 4, moves all extremities to command, no focal deficits, speech clear, pupils PERRLA GU: External male catheter in place  Resolved Hospital Problem list     Assessment & Plan:   #Acute on Chronic Hypoxic Respiratory Failure due to Right Lung Atelectasis, recurrent right pleural effusion, Stage IV Adenocarcinoma, and concern for Pneumonia #COPD without acute exacerbation PMHx: COPD on supplemental O2 at baseline, former smoker Has chronic Pleurx catheter in place for approximately 3 to 4 weeks -Supplemental O2 as needed to maintain O2 sats 88 to 92% -BiPAP if needed, wean as tolerated -Follow intermittent Chest X-ray & ABG as needed -Bronchodilators and Pulmicort nebs -IV Steroids (Solumedrol 40 mg BID) -ABX as above -Diuresis as BP and renal function permits -Drainage of pleural effusions via Pleurx catheter as needed -Pulmonary toilet as able  #Atrial Fibrillation w/ RVR, suspect exacerbated by Acute Hypoxic Respiratory Failure #Hypotension: Hypovolemic + Cardiogenic vs. Septic vs. related to CCB administration #Chronic HFpEF without acute exacerbation PMHx: A.fib on Eliquis, HFpEF, CAD, Hypertension Echocardiogram 01/29/21: LVEF 55-60%, Grade I DD, RV systolic function normal -Continuous cardiac monitoring -Maintain MAP >65 -Gentle IV fluids -Vasopressors as needed to maintain MAP goal -Start Midodrine 5 mg TID -Trend lactic acid until normalized -Trend HS Troponin until peaked -Echocardiogram pending -Check TSH/thyroid panel -Amiodarone gtt -Continue outpatient Eliquis -Cardiology consult, appreciate input  #Severe Sepsis due to suspected Pneumonia (Meets SIRS Criteria on 6/25 @ 14:00 : RR 24, HR 161) -Monitor fever curve -Trend WBC's & Procalcitonin -Follow cultures as above -Continue empiric Cefepime, Vancomycin, and Diflucan pending cultures &  sensitivities  #Anemia, suspect due to chemotherapy #Thrombocytopenia, suspect due to chemotherapy -Monitor for S/Sx of bleeding -Trend CBC -Eliquis for Anticoagulation/VTE Prophylaxis  -Transfuse for Hgb <7  #Mild Hyponatremia due to dehydration #AG Metabolic Acidosis due to Lactic Acidosis -Monitor I&O's / urinary output -Follow BMP -Ensure adequate renal perfusion -Avoid nephrotoxic agents as able -Replace electrolytes as indicated ~ Pharmacy following for assistance with electrolyte replacement -Start Bicarb gtt @ 50 ml/hr  #Diabetes Mellitus Type II -CBG's ac & hs; Target range of 140 to 180 -SSI -Follow ICU Hypo/Hyperglycemia protocol  #Stage IV Lung Adenocarcinoma with mets to bone -Will consult Oncology, appreciate input      Best Practice (right click and "Reselect all SmartList Selections" daily)   Diet/type: Regular consistency (see orders) DVT prophylaxis:  DOAC GI prophylaxis: PPI Lines: N/A Foley:  N/A Code Status:  full code Last date of multidisciplinary goals of care discussion [N/A]  6/25: Pt and his wife updated at bedside.  All questions answered.  Labs   CBC: Recent Labs  Lab 06/13/23 1640 06/14/23 0547  WBC 10.6* 8.3  NEUTROABS 8.4*  --   HGB 12.8* 10.9*  HCT 39.8 33.5*  MCV 86.3 87.2  PLT 87* 75*    Basic Metabolic Panel: Recent Labs  Lab 06/13/23 1640 06/14/23 0547  NA 133* 134*  K 4.3 3.6  CL 99 104  CO2 16* 14*  GLUCOSE 203* 131*  BUN 39* 23  CREATININE 0.83 0.57*  CALCIUM 8.0* 7.6*   GFR: Estimated Creatinine Clearance: 88.5 mL/min (A) (by C-G formula based on SCr of 0.57 mg/dL (L)). Recent Labs  Lab 06/13/23 1640 06/13/23 1831 06/14/23 0547  PROCALCITON  --   --  1.12  WBC 10.6*  --  8.3  LATICACIDVEN 3.7* 3.1*  --     Liver Function Tests: Recent Labs  Lab 06/13/23 1640 06/14/23 0547  AST 19 17  ALT 16 16  ALKPHOS 123 103  BILITOT 1.5* 1.6*  PROT 5.3* 4.9*  ALBUMIN 2.5* 2.1*   No results for  input(s): "LIPASE", "AMYLASE" in the last 168 hours. No results for input(s): "AMMONIA" in the last 168 hours.  ABG No results found for: "PHART", "PCO2ART", "PO2ART", "HCO3", "TCO2", "ACIDBASEDEF", "O2SAT"   Coagulation Profile: Recent Labs  Lab 06/13/23 1640  INR 2.5*    Cardiac Enzymes: No results for input(s): "CKTOTAL", "CKMB", "CKMBINDEX", "TROPONINI" in the last 168 hours.  HbA1C: Hemoglobin A1C  Date/Time Value Ref Range Status  01/15/2021 10:29 AM 7.8 (A) 4.0 - 5.6 % Final   Hgb A1c MFr Bld  Date/Time Value Ref Range Status  05/09/2023 04:44 PM 7.3 (H) 4.8 - 5.6 % Final    Comment:    (NOTE) Pre diabetes:          5.7%-6.4%  Diabetes:              >6.4%  Glycemic control for   <7.0% adults with diabetes   09/26/2020 08:47 AM 8.2 (H) 4.6 - 6.5 % Final    Comment:    Glycemic Control Guidelines for People with Diabetes:Non Diabetic:  <6%Goal of Therapy: <7%Additional Action Suggested:  >8%     CBG: No results for input(s): "GLUCAP" in the last 168 hours.  Review of Systems:   Positives in BOLD: Gen: Denies fever, chills, weight change, fatigue, night sweats HEENT: Denies blurred vision, double vision, hearing loss, tinnitus, sinus congestion, rhinorrhea, sore throat, neck stiffness, dysphagia PULM: Denies shortness of breath, cough, sputum production, hemoptysis, wheezing CV: Denies chest pain, edema, orthopnea, paroxysmal nocturnal dyspnea, palpitations GI: Denies abdominal pain, nausea, vomiting, diarrhea, hematochezia, melena, constipation, change in bowel habits GU: Denies dysuria, hematuria, polyuria, oliguria, urethral discharge Endocrine: Denies hot or cold intolerance, polyuria, polyphagia or appetite change Derm: Denies rash, dry skin, scaling or peeling skin change Heme: Denies easy bruising, bleeding, bleeding gums Neuro: Denies headache, numbness, weakness, slurred speech, loss of memory or consciousness   Past Medical History:  He,  has a  past medical history of Abnormal EKG (11/15/2017), Abnormal stress test, Atrial fibrillation (HCC), CAD in native artery, Diabetes mellitus (HCC), HCV (hepatitis C virus), HLD (hyperlipidemia), Hypertension, Large cell lymphoma (HCC), Peripheral neuropathy, Stroke (HCC), and Systolic dysfunction.   Surgical History:   Past Surgical History:  Procedure Laterality Date  APPENDECTOMY     CORONARY PRESSURE/FFR STUDY N/A 12/15/2017   Procedure: INTRAVASCULAR PRESSURE WIRE/FFR STUDY;  Surgeon: Iran Ouch, MD;  Location: [facility] INVASIVE CV LAB;  Service: Cardiovascular;  Laterality: N/A;   IR GUIDED DRAIN W CATHETER PLACEMENT  05/02/2023   IR IMAGING GUIDED PORT INSERTION  05/02/2023   IR RADIOLOGIST EVAL & MGMT  05/10/2023   IR US GUIDE BX ASP/DRAIN  05/02/2023   LEFT HEART CATH AND CORONARY ANGIOGRAPHY N/A 12/15/2017   Procedure: LEFT HEART CATH AND CORONARY ANGIOGRAPHY;  Surgeon: Iran Ouch, MD;  Location: [facility] INVASIVE CV LAB;  Service: Cardiovascular;  Laterality: N/A;   TONSILLECTOMY AND ADENOIDECTOMY       Social History:   reports that he quit smoking about 5 weeks ago. His smoking use included cigarettes. He smoked an average of .5 packs per day. He has never used smokeless tobacco. He reports current alcohol use. He reports that he does not use drugs.   Family History:  His family history includes Dementia in his maternal grandmother; Diabetes in his father and mother; Hyperlipidemia in his mother; Hypertension in his father and mother; Kidney disease in his father; Stroke in his mother.   Allergies No Known Allergies   Home Medications  Prior to Admission medications   Medication Sig Start Date End Date Taking? Authorizing Provider  calcium carbonate ([employer] CALCIUM) 600 MG tablet Take 2 tablets by mouth daily. 06/03/23  Yes [provider]  acetaminophen (TYLENOL) 500 MG tablet Take 500 mg by mouth every 6 (six) hours as needed.    [provider]  apixaban  (ELIQUIS) 5 MG TABS tablet TAKE 1 TABLET BY MOUTH TWICE A DAY 04/12/23   Antonieta Iba, MD  blood glucose meter kit and supplies KIT Dispense based on patient and insurance preference. Use 2 times daily as directed. (FOR ICD-10 E11.9). Patient not taking: Reported on 05/09/2023 09/29/20   Glori Luis, MD  calcium carbonate (CALCIUM 600) 600 MG TABS tablet Take 2 tablets (1,200 mg total) by mouth daily. 06/03/23   Alinda Dooms, NP  cholecalciferol (VITAMIN D3) 25 MCG (1000 UNIT) tablet Take 1 tablet (1,000 Units total) by mouth daily. 06/03/23   Alinda Dooms, NP  dexamethasone (DECADRON) 2 MG tablet Take 1 tablet (2 mg total) by mouth daily. 06/03/23   Alinda Dooms, NP  diltiazem (CARDIZEM) 30 MG tablet Take 1 tablet (30 mg total) by mouth daily as needed (for fast heart rate). 05/11/23 05/10/24  Wouk, Wilfred Curtis, MD  fentaNYL (DURAGESIC) 25 MCG/HR Place 1 patch onto the skin every 3 (three) days. 05/18/23   Jeralyn Ruths, MD  fluconazole (DIFLUCAN) 100 MG tablet Take 1 tablet (100 mg total) by mouth daily. 06/06/23   Borders, Daryl Eastern, NP  JARDIANCE 25 MG TABS tablet TAKE 1 TABLET BY MOUTH EVERY DAY 02/22/22   Worthy Rancher B, FNP  lidocaine-prilocaine (EMLA) cream Apply to affected area once 04/28/23   Jeralyn Ruths, MD  metoprolol succinate (TOPROL-XL) 100 MG 24 hr tablet Take 1 tablet (100 mg total) by mouth daily. TAKE WITH OR IMMEDIATELY FOLLOWING A MEAL. 05/11/23   Wouk, Wilfred Curtis, MD  mirtazapine (REMERON) 7.5 MG tablet Take 1 tablet (7.5 mg total) by mouth at bedtime. For mood, appetite, and sleep 06/03/23   Alinda Dooms, NP  Multiple Vitamin (MULTI-VITAMIN) tablet Take 1 tablet by mouth daily.    [provider]  naloxone [facility]) nasal spray 4 mg/0.1 mL See  admin instructions. 04/22/23   [provider]  nitroGLYCERIN (NITROSTAT) 0.4 MG SL tablet Place 1 tablet (0.4 mg total) under the tongue every 5 (five) minutes as needed for chest pain. 05/28/22    Antonieta Iba, MD  nystatin (MYCOSTATIN) 100000 UNIT/ML suspension Take 5 mLs (500,000 Units total) by mouth 4 (four) times daily. Swish and spit. 05/12/23   Jeralyn Ruths, MD  ondansetron (ZOFRAN) 8 MG tablet Take 1 tablet (8 mg total) by mouth every 8 (eight) hours as needed for nausea or vomiting. Start on the third day after carboplatin. 04/28/23   Jeralyn Ruths, MD  ONETOUCH VERIO test strip USE TWICE A DAY AS DIRECTED Patient not taking: Reported on 05/09/2023 11/20/20   Glori Luis, MD  oxyCODONE (OXY IR/ROXICODONE) 5 MG immediate release tablet Take 1 tablet (5 mg total) by mouth every 6 (six) hours as needed for severe pain. 04/12/23   Jeralyn Ruths, MD  prochlorperazine (COMPAZINE) 10 MG tablet Take 1 tablet (10 mg total) by mouth every 6 (six) hours as needed for nausea or vomiting. 04/28/23   Jeralyn Ruths, MD  rosuvastatin (CRESTOR) 10 MG tablet TAKE 1 TABLET BY MOUTH EVERY DAY 05/26/23   Antonieta Iba, MD     Critical care time: 55 minutes     Harlon Ditty, AGACNP-BC [facility] Prefer epic messenger for cross cover needs If after hours, please call E-link

## 2023-06-14 NOTE — Progress Notes (Signed)
Received patient from ED, pt have been in Ed holding area for 25 hrs per wife. Pt is A/O x 4, on Amio and Levo gtt. NS at 50 mls. Pt and wife oriented to room and call light.

## 2023-06-14 NOTE — Evaluation (Signed)
Occupational Therapy Evaluation Patient Details Name: Charles Hickman MRN: [id number] DOB: 05-08-1961 Today's Date: 06/14/2023   History of Present Illness 62 y.o. 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 Patient also has malignant pleural effusion and is status post Pleurx catheter.  Patient is status post recent pathologic fracture of the left hip after transfer from from wheelchair to the car.  He underwent arthroplasty  at [facility] on 05/21/2023.  He is now admitted 06/13/2023 to the hospital with acute on chronic respiratory failure.  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   Clinical Impression   Patient presenting with decreased Ind in self care,balance, functional mobility/transfers, endurance, and safety awareness. Patient reports being mod I at baseline and living with wife. He does endorse some bad days and wife has been able to physically assist him at home. Pt rolling L <> R to pull pants over B hips. OT noted pt with stitches in L hip and notified RN. Supine >sit with min A and pt reports feeling very dizzy with HR increasing to 160's and RR increased to 40's. Pt returned to supine secondary to not feeling well and notified RN.  Patient will benefit from acute OT to increase overall independence in the areas of ADLs, functional mobility, and safety awareness in order to safely discharge.     Recommendations for follow up therapy are one component of a multi-disciplinary discharge planning process, led by the attending physician.  Recommendations may be updated based on patient status, additional functional criteria and insurance authorization.   Assistance Recommended at Discharge Intermittent Supervision/Assistance  Patient can return home with  the following A little help with walking and/or transfers;A little help with bathing/dressing/bathroom;Assistance with cooking/housework;Assist for transportation;Help with stairs or ramp for entrance    Functional Status Assessment  Patient has had a recent decline in their functional status and demonstrates the ability to make significant improvements in function in a reasonable and predictable amount of time.  Equipment Recommendations  Hospital bed       Precautions / Restrictions Precautions Precautions: Fall      Mobility Bed Mobility Overal bed mobility: Needs Assistance Bed Mobility: Supine to Sit, Sit to Supine     Supine to sit: Min assist Sit to supine: Min assist        Transfers                   General transfer comment: unable to stand secondary to HR in 160's and RR in 40's with sitting EOB      Balance Overall balance assessment: Needs assistance Sitting-balance support: Feet supported Sitting balance-Leahy Scale: Good                                     ADL either performed or assessed with clinical judgement   ADL Overall ADL's : Needs assistance/impaired                     Lower Body Dressing: Minimal assistance;Bed level Lower Body Dressing Details (indicate cue type and reason): pt rolling L <> R in bed with min A and therapist pulling pants over B hips  Vision Patient Visual Report: No change from baseline              Pertinent Vitals/Pain Pain Assessment Pain Assessment: No/denies pain     Hand Dominance Right   Extremity/Trunk Assessment Upper Extremity Assessment Upper Extremity Assessment: Generalized weakness   Lower Extremity Assessment Lower Extremity Assessment: Generalized weakness       Communication Communication Communication: No difficulties   Cognition Arousal/Alertness: Awake/alert Behavior During Therapy: WFL for tasks assessed/performed Overall  Cognitive Status: Within Functional Limits for tasks assessed                                                  Home Living Family/patient expects to be discharged to:: Private residence Living Arrangements: Spouse/significant other Available Help at Discharge: Family;Available 24 hours/day Type of Home: House Home Access: Stairs to enter [employer] of Steps: 2-3   Home Layout: One level     Bathroom Shower/Tub: Tub/shower unit         Home Equipment: Agricultural consultant (2 wheels);BSC/3in1          Prior Functioning/Environment Prior Level of Function : Needs assist               ADLs Comments: Pt endorses being Mod I recently with self care tasks but that he does have "bad day" and needs assistance from wife at time.        OT Problem List: Decreased strength;Decreased activity tolerance;Decreased safety awareness;Impaired balance (sitting and/or standing);Decreased knowledge of use of DME or AE      OT Treatment/Interventions: Self-care/ADL training;Therapeutic exercise;Therapeutic activities;Energy conservation;DME and/or AE instruction;Patient/family education;Manual therapy;Balance training    OT Goals(Current goals can be found in the care plan section) Acute Rehab OT Goals Patient Stated Goal: to feel better and return home OT Goal Formulation: With patient Time For Goal Achievement: 06/28/23 Potential to Achieve Goals: Fair ADL Goals Pt Will Perform Grooming: with supervision;sitting Pt Will Perform Lower Body Dressing: with supervision;sit to/from stand Pt Will Transfer to Toilet: bedside commode;with supervision Pt Will Perform Toileting - Clothing Manipulation and hygiene: with supervision;sit to/from stand  OT Frequency: Min 2X/week       AM-PAC OT "6 Clicks" Daily Activity     Outcome Measure Help from another person eating meals?: None Help from another person taking care of personal grooming?: A Little Help from  another person toileting, which includes using toliet, bedpan, or urinal?: A Lot Help from another person bathing (including washing, rinsing, drying)?: A Lot Help from another person to put on and taking off regular upper body clothing?: A Little Help from another person to put on and taking off regular lower body clothing?: A Lot 6 Click Score: 16   End of Session Nurse Communication: Mobility status;Other (comment) (HR and RR)  Activity Tolerance: Treatment limited secondary to medical complications (Comment) Patient left: in bed  OT Visit Diagnosis: Unsteadiness on feet (R26.81);Repeated falls (R29.6);Muscle weakness (generalized) (M62.81)                Time: 1345-1411 OT Time Calculation (min): 26 min Charges:  OT General Charges $OT Visit: 1 Visit OT Evaluation $OT Eval Moderate Complexity: 1 Mod OT Treatments $Self Care/Home Management : 8-22 mins  Jackquline Denmark, MS, OTR/L , CBIS ascom [phone]  06/14/23, 3:44 PM

## 2023-06-14 NOTE — Progress Notes (Signed)
Pharmacy Antibiotic Note  Charles Hickman is a 62 y.o. 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  Pharmacy has been consulted for vancomycin and cefepime dosing.  Plan:  1) continue cefepime 2 grams IV every 8 hours  2) start vancomycin 1000 mg IV every 12 hours Goal AUC 400-550. Expected AUC: 551  SCr used: 0.80 mg/dL (rounded up)   Height: 5\' 10"  (177.8 cm) Weight: 67.1 kg (147 lb 14.9 oz) IBW/kg (Calculated) : 73  Temp (24hrs), Avg:97.9 F (36.6 C), Min:97.5 F (36.4 C), Max:98.2 F (36.8 C)  Recent Labs  Lab 06/13/23 1640 06/13/23 1831 06/14/23 0547  WBC 10.6*  --  8.3  CREATININE 0.83  --  0.57*  LATICACIDVEN 3.7* 3.1*  --      Estimated Creatinine Clearance: 88.5 mL/min (A) (by C-G formula based on SCr of 0.57 mg/dL (L)).    No Known Allergies  Antimicrobials this admission: 06/24 azithromycin x 1 06/24 vancomycin >>  06/24 cefepime >>  06/25 fluconazole >>  Microbiology results: 06/24 BCx: NG < 24h 06/24 MRSA PCR: negative  Thank you for allowing pharmacy to be a part of this patient's care.  BRACY PEPPER 06/14/2023 4:24 PM

## 2023-06-14 NOTE — ED Notes (Signed)
Patient placed on bedpan at this time.

## 2023-06-15 ENCOUNTER — Ambulatory Visit: Payer: Managed Care, Other (non HMO)

## 2023-06-15 ENCOUNTER — Inpatient Hospital Stay (HOSPITAL_COMMUNITY)
Admit: 2023-06-15 | Discharge: 2023-06-15 | Disposition: A | Discharge status: Home / Self Care | Payer: Managed Care, Other (non HMO) | Attending: Pulmonary Disease | Admitting: Pulmonary Disease

## 2023-06-15 DIAGNOSIS — J9621 Acute and chronic respiratory failure with hypoxia: Secondary | ICD-10-CM | POA: Diagnosis not present

## 2023-06-15 DIAGNOSIS — R778 Other specified abnormalities of plasma proteins: Secondary | ICD-10-CM

## 2023-06-15 DIAGNOSIS — E1159 Type 2 diabetes mellitus with other circulatory complications: Secondary | ICD-10-CM

## 2023-06-15 DIAGNOSIS — I4891 Unspecified atrial fibrillation: Secondary | ICD-10-CM | POA: Diagnosis not present

## 2023-06-15 DIAGNOSIS — E43 Unspecified severe protein-calorie malnutrition: Secondary | ICD-10-CM | POA: Insufficient documentation

## 2023-06-15 DIAGNOSIS — J90 Pleural effusion, not elsewhere classified: Secondary | ICD-10-CM | POA: Diagnosis not present

## 2023-06-15 DIAGNOSIS — I4819 Other persistent atrial fibrillation: Secondary | ICD-10-CM | POA: Diagnosis not present

## 2023-06-15 LAB — RENAL FUNCTION PANEL
Albumin: 2 g/dL — ABNORMAL LOW (ref 3.5–5.0)
Anion gap: 10 (ref 5–15)
BUN: 14 mg/dL (ref 8–23)
CO2: 22 mmol/L (ref 22–32)
Calcium: 7.5 mg/dL — ABNORMAL LOW (ref 8.9–10.3)
Chloride: 100 mmol/L (ref 98–111)
Creatinine, Ser: 0.38 mg/dL — ABNORMAL LOW (ref 0.61–1.24)
GFR, Estimated: >60 mL/min (ref >60)
Glucose, Bld: 146 mg/dL — ABNORMAL HIGH (ref 70–99)
Phosphorus: 1.3 mg/dL — ABNORMAL LOW (ref 2.5–4.6)
Potassium: 3.1 mmol/L — ABNORMAL LOW (ref 3.5–5.1)
Sodium: 132 mmol/L — ABNORMAL LOW (ref 135–145)

## 2023-06-15 LAB — CBC
HCT: 32.9 % — ABNORMAL LOW (ref 39.0–52.0)
Hemoglobin: 10.8 g/dL — ABNORMAL LOW (ref 13.0–17.0)
MCH: 28.5 pg (ref 26.0–34.0)
MCHC: 32.8 g/dL (ref 30.0–36.0)
MCV: 86.8 fL (ref 80.0–100.0)
Platelets: 95 10*3/uL — ABNORMAL LOW (ref 150–400)
RBC: 3.79 MIL/uL — ABNORMAL LOW (ref 4.22–5.81)
RDW: 19.9 % — ABNORMAL HIGH (ref 11.5–15.5)
WBC: 10.9 10*3/uL — ABNORMAL HIGH (ref 4.0–10.5)
nRBC: 0 % (ref 0.0–0.2)

## 2023-06-15 LAB — GLUCOSE, CAPILLARY
Glucose-Capillary: 133 mg/dL — ABNORMAL HIGH (ref 70–99)
Glucose-Capillary: 177 mg/dL — ABNORMAL HIGH (ref 70–99)
Glucose-Capillary: 238 mg/dL — ABNORMAL HIGH (ref 70–99)
Glucose-Capillary: 183 mg/dL — ABNORMAL HIGH (ref 70–99)
Glucose-Capillary: 150 mg/dL — ABNORMAL HIGH (ref 70–99)
Glucose-Capillary: 144 mg/dL — ABNORMAL HIGH (ref 70–99)

## 2023-06-15 LAB — THYROID PANEL WITH TSH
Free Thyroxine Index: 0.8 — ABNORMAL LOW (ref 1.2–4.9)
T3 Uptake Ratio: 30 % (ref 24–39)
T4, Total: 2.8 ug/dL — ABNORMAL LOW (ref 4.5–12.0)
TSH: 0.874 u[IU]/mL (ref 0.450–4.500)

## 2023-06-15 LAB — TROPONIN I (HIGH SENSITIVITY): Troponin I (High Sensitivity): 71 ng/L — ABNORMAL HIGH (ref <18)

## 2023-06-15 LAB — HEPATIC FUNCTION PANEL
ALT: 16 U/L (ref 0–44)
AST: 18 U/L (ref 15–41)
Albumin: 2 g/dL — ABNORMAL LOW (ref 3.5–5.0)
Alkaline Phosphatase: 118 U/L (ref 38–126)
Bilirubin, Direct: 0.1 mg/dL (ref 0.0–0.2)
Indirect Bilirubin: 0.8 mg/dL (ref 0.3–0.9)
Total Bilirubin: 0.9 mg/dL (ref 0.3–1.2)
Total Protein: 5 g/dL — ABNORMAL LOW (ref 6.5–8.1)

## 2023-06-15 LAB — ECHOCARDIOGRAM COMPLETE
Est EF: UNDETERMINED
Height: 70 in
Weight: 2409.19 oz

## 2023-06-15 LAB — PHOSPHORUS: Phosphorus: 3.7 mg/dL (ref 2.5–4.6)

## 2023-06-15 LAB — CULTURE, BLOOD (ROUTINE X 2)

## 2023-06-15 LAB — POTASSIUM: Potassium: 3.9 mmol/L (ref 3.5–5.1)

## 2023-06-15 LAB — MAGNESIUM: Magnesium: 1.7 mg/dL (ref 1.7–2.4)

## 2023-06-15 LAB — CORTISOL: Cortisol, Plasma: 8.2 ug/dL

## 2023-06-15 LAB — PROCALCITONIN: Procalcitonin: 0.59 ng/mL

## 2023-06-15 MED ORDER — MAGNESIUM SULFATE 2 GM/50ML IV SOLN
2.0000 g | Freq: Once | INTRAVENOUS | Status: AC
  Administered 2023-06-15: 2 g via INTRAVENOUS
  Filled 2023-06-15: qty 50

## 2023-06-15 MED ORDER — AMIODARONE HCL 200 MG PO TABS
400.0000 mg | ORAL_TABLET | Freq: Two times a day (BID) | ORAL | Status: AC
  Administered 2023-06-15 – 2023-06-19 (×9): 400 mg via ORAL
  Filled 2023-06-15 (×10): qty 2

## 2023-06-15 MED ORDER — POTASSIUM PHOSPHATES 15 MMOLE/5ML IV SOLN
45.0000 mmol | Freq: Once | INTRAVENOUS | Status: AC
  Administered 2023-06-15: 45 mmol via INTRAVENOUS
  Filled 2023-06-15: qty 15

## 2023-06-15 MED ORDER — ALBUMIN HUMAN 25 % IV SOLN
25.0000 g | Freq: Four times a day (QID) | INTRAVENOUS | Status: AC
  Administered 2023-06-15 (×2): 25 g via INTRAVENOUS
  Filled 2023-06-15 (×2): qty 100

## 2023-06-15 MED ORDER — MIDODRINE HCL 5 MG PO TABS
10.0000 mg | ORAL_TABLET | Freq: Three times a day (TID) | ORAL | Status: DC
  Administered 2023-06-15 – 2023-06-16 (×3): 10 mg via ORAL
  Filled 2023-06-15 (×3): qty 2

## 2023-06-15 MED ORDER — ADULT MULTIVITAMIN W/MINERALS CH
1.0000 | ORAL_TABLET | Freq: Every day | ORAL | Status: DC
  Administered 2023-06-15 – 2023-06-21 (×7): 1 via ORAL
  Filled 2023-06-15 (×7): qty 1

## 2023-06-15 MED ORDER — ENSURE ENLIVE PO LIQD
237.0000 mL | Freq: Three times a day (TID) | ORAL | Status: DC
  Administered 2023-06-15 – 2023-06-18 (×8): 237 mL via ORAL

## 2023-06-15 MED ORDER — ALBUMIN HUMAN 25 % IV SOLN: 25.0000 g | Freq: Once | INTRAVENOUS | Status: DC

## 2023-06-15 NOTE — Progress Notes (Signed)
PT Cancellation Note  Patient Details Name: JOVONTA LEVIT MRN: [id number] DOB: 27-Oct-1961   Cancelled Treatment:    Reason Eval/Treat Not Completed: Medical issues which prohibited therapy.Communicated with care team via secure chat and was instructed to hold on evaluation today due to amiodarone use and low BP. Will re attempt tomorrow.   Malachi Carl, SPT  Malachi Carl 06/15/2023, 8:54 AM

## 2023-06-15 NOTE — Consult Note (Signed)
WOC Nurse Consult Note: Reason for Consult: Stage 2 gluteal cleft  Wound type: partial thickness skin loss likely related to friction and moisture (wife and patient state they believe this area is from sliding up and down in the chair)  Pressure Injury POA: NA, not pressure related  Measurement: 7 cm x 10 cm total area gluteal cleft and coccyx with  erythema and  partial thickness skin loss  Wound bed: 100% pink moist  Drainage (amount, consistency, odor) minimal serosanguinous  Periwound: erythematous  Dressing procedure/placement/frequency: Clean coccyx and gluteal cleft with NS, apply Xeroform gauze [place] [phone]) to entire area daily and secure with sacral silicone foam with tip pointing upwards. May lift sacral foam to reapply Xeroform daily.  Change foam dressing q3 days and prn soiling.   Pressure redistribution cushion ordered for patient as well Hart [place] [phone]) for when patient is up in chair.  Did discuss with wife and patient that Desitin would be okay to use on this area at home.    POC discussed with patient, wife and bedside nurse.  WOC team will not follow at this time. Re-consult if further needs arise.   Thank you,    Priscella Mann MSN, RN-BC, [employer] [phone]

## 2023-06-15 NOTE — Progress Notes (Signed)
Initial Nutrition Assessment  DOCUMENTATION CODES:   Severe malnutrition in context of chronic illness  INTERVENTION:   -MVI with minerals daily -Ensure Enlive po TID, each supplement provides 350 kcal and 20 grams of protein -Magic cup TID with meals, each supplement provides 290 kcal and 9 grams of protein   NUTRITION DIAGNOSIS:   Severe Malnutrition related to chronic illness (stage IV lung cancer with mets to bone) as evidenced by moderate fat depletion, severe fat depletion, moderate muscle depletion, severe muscle depletion, percent weight loss.  GOAL:   Patient will meet greater than or equal to 90% of their needs  MONITOR:   PO intake, Supplement acceptance  REASON FOR ASSESSMENT:   Malnutrition Screening Tool    ASSESSMENT:   Pt with a past medical history significant for A-fib, CAD, Hypertension, HFrEF, diabetes, hepatitis C, Stage IV Adenocarcinoma of the lung with mets to the bone, large cell lymphoma, recurrent pleural effusions, COPD on 2L supplemental oxygen at baseline, and stroke who presented for evaluation of worsening shortness of breath and O2 saturations as low as 70 at home despite increasing O2.  Pt admitted with a-fib with RVR.   Reviewed I/O's: +1.7 L x 24 hours and +3.8 L since admission  UOP: 750 ml x 24 hours  Drain output: 550 ml x 24 hours  Case discussed with MD, RN, and during ICU rounds. Pt admitted yesterday with pneumonia. He developed a-fib with RVR and was placed an amiodarone. Pt has a pleural drain- 500 ml removed yesterday. Per RN, pt is not eating much. His last chemo treatment was Friday, 06/10/23. He is very weak and PT/OT evaluations have been ordered.    Per [facility] note on 06/15/23, pt with partial thickness skin loss to gluteal cleft and coccyx.   Per RN, pt eating better today and is drinking Ensure.   Spoke with pt at bedside, who was pleasant and in good spirits today. He reports feeling a little better today. He shares  that at baseline is appetite comes and goes secondary to side effects of chemotherapy treatments. Pt's largest complaint today is feeling "like my mouth is burning". He has had this symptom since admission, which he attributes to side effects from chemo. Pt shares he has had some nausea in the past, but this is controlled now. Pt does not have a structured eating pattern, but tries to get in as much protein and calories as possible. He drinks a protein shake at home intermittently; he reports this drink has 80 grams of protein per serving, but does not recall the name of it. He ate some grits and potatoes oup today without difficulty. He consumed about 25% of his Ensure supplement. Pt shares he does not like this supplement much, as he does not prefer the flavor options, but willing to drink if needed.   Pt shares that his UBW is around 205#. He endorses progressive wt loss over the past year. He explains that he started losing weight, but became much more noticeable after he was diagnosed with cancer. Reviewed wt hx; pt has experienced a 7%  wt loss over the past month, which is significant for time frame. Pt shared with this RD pictures of himself from November and December 2023. Pt appear much more thin and frail in comparison to pictures.   Discussed importance of good meal and supplement intake to promote healing. Pt amenable to supplements.   Palliative care following for goals of care; pt desires full aggressive care at this  time.   Medications reviewed and include IV albumin, solu-medrol, neo-synephrine, and potassium phoasphate.   Lab Results  Component Value Date   HGBA1C 7.3 (H) 05/09/2023   PTA DM medications are 25 mg jardiance daily.   Labs reviewed: CBGS: 238 (inpatient orders for glycemic control are 0-20 units insulin aspart every 4 hours).    NUTRITION - FOCUSED PHYSICAL EXAM:  Flowsheet Row Most Recent Value  Orbital Region Moderate depletion  Upper Arm Region Severe depletion   Thoracic and Lumbar Region Severe depletion  Buccal Region Moderate depletion  Temple Region Moderate depletion  Clavicle Bone Region Severe depletion  Clavicle and Acromion Bone Region Severe depletion  Scapular Bone Region Severe depletion  Dorsal Hand Severe depletion  Patellar Region Severe depletion  Anterior Thigh Region Severe depletion  Posterior Calf Region Severe depletion  Edema (RD Assessment) Mild  Hair Reviewed  Eyes Reviewed  Mouth Reviewed  Skin Reviewed  Nails Reviewed       Diet Order:   Diet Order             Diet full liquid Room service appropriate? Yes; Fluid consistency: Thin  Diet effective now                   EDUCATION NEEDS:   Education needs have been addressed  Skin:  Skin Assessment: Skin Integrity Issues: Skin Integrity Issues:: Other (Comment) Other: partial thickness skin loss to gluteal cleft and coccyx  Last BM:  06/13/23  Height:   Ht Readings from Last 1 Encounters:  06/13/23 5\' 10"  (1.778 m)    Weight:   Wt Readings from Last 1 Encounters:  06/15/23 68.3 kg    Ideal Body Weight:  75.5 kg  BMI:  Body mass index is 21.61 kg/m.  Estimated Nutritional Needs:   Kcal:  2050-2250  Protein:  105-120 grams  Fluid:  > 2 L    Levada Schilling, RD, LDN, CDCES Registered Dietitian II Certified Diabetes Care and Education Specialist Please refer to [facility] for RD and/or RD on-call/weekend/after hours pager

## 2023-06-15 NOTE — Progress Notes (Addendum)
[facility]  - [place] [facility]   PATIENT NAME: Charles Hickman    MR#:  [id number]  DATE OF BIRTH:  [birth year]  SUBJECTIVE:  no family at bedside during my evaluation. Patient came in with increased ability, fatigue ability unable to get around doing his ADL. Recently started on chemo and radiation for his lung cancer which is metastasized. Patient tells me his last chemo was passed Friday and has felt drained out. He has a right chronic pleurex catheter placement. Awaiting for wife to bring the pleurex kit for drainage to be done. Patient has chronic cough. 550 cc was drained yday Remains on low dose Phenylephrine. BP much improved. Now off Amiodarone gtt. HR in the 70-80's   VITALS:  Blood pressure 110/61, pulse 77, temperature 98 F (36.7 C), temperature source Oral, resp. rate 18, height 5\' 10"  (1.778 m), weight 68.3 kg, SpO2 100 %.  PHYSICAL EXAMINATION:   GENERAL:  62 y.o.-year-old patient with no acute distress. Appears chronically ill, debilitated LUNGS: decreased breath sounds more on right than left  CARDIOVASCULAR: S1, S2 normal.no murmur ABDOMEN: Soft, nontender, nondistended. Bowel sounds present.  EXTREMITIES: trace edema b/l.    NEUROLOGIC: nonfocal  patient is alert and awake, weak, deconditioned SKIN:  Pressure Injury 06/14/23 Coccyx Right;Left;Medial Stage 2 -  Partial thickness loss of dermis presenting as a shallow open injury with a red, pink wound bed without slough. Red open area with skin slogging (Active)  06/14/23 1745  Location: Coccyx  Location Orientation: Right;Left;Medial  Staging: Stage 2 -  Partial thickness loss of dermis presenting as a shallow open injury with a red, pink wound bed without slough.  Wound Description (Comments): Red open area with skin slogging  Present on Admission: Yes      LABORATORY PANEL:  CBC Recent Labs  Lab 06/15/23 0412  WBC 10.9*  HGB 10.8*  HCT 32.9*  PLT 95*     Chemistries  Recent  Labs  Lab 06/15/23 0412  NA 132*  K 3.1*  CL 100  CO2 22  GLUCOSE 146*  BUN 14  CREATININE 0.38*  CALCIUM 7.5*  MG 1.7  AST 18  ALT 16  ALKPHOS 118  BILITOT 0.9    Cardiac Enzymes No results for input(s): "TROPONINI" in the last 168 hours. 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 Pleural effusion, known or suspected. PE suspected EXAM: CT ANGIOGRAPHY CHEST WITH CONTRAST TECHNIQUE: Multidetector CT imaging of the chest was performed using the standard protocol during bolus administration of intravenous contrast. Multiplanar CT image reconstructions and MIPs were obtained to evaluate the vascular anatomy. RADIATION DOSE REDUCTION: This exam was performed according to the departmental dose-optimization program which includes automated exposure control, adjustment of the mA and/or kV according to patient size and/or use of iterative reconstruction technique. CONTRAST:  75mL OMNIPAQUE IOHEXOL 350 MG/ML SOLN COMPARISON:  Chest radiographs 06/13/2023 and PET/CT 04/19/2023 FINDINGS: Cardiovascular: No acute pulmonary embolism. There is abrupt tapering of the right middle lobe pulmonary artery similar to 03/31/2023. Normal heart size. No pericardial effusion. Coronary artery and aortic atherosclerotic calcification. Right chest wall Port-A-Cath. Mediastinum/Nodes: Unremarkable esophagus. Right paratracheal lymph nodes measuring 12 (4/35) and 13 mm (4/44), respectively. Prevascular node on 4/39 measures 8 mm. Left supraclavicular lymphadenopathy measuring up 10 mm on 04/10. These are similar to  04/19/2023. Lungs/Pleura: Layering fluid in the esophagus extending into the right mainstem bronchus which is completely opacified. Complete atelectasis of the right lung. Large right pleural effusion. There is some  ill-defined pleural nodularity similar to PET/CT 04/19/2023. Right chest tube. Moderate left pleural effusion associated atelectasis. Diffuse nodular interlobular septal thickening and bronchial wall thickening in the left lung compatible with lymphangitic carcinomatosis. Upper Abdomen: No acute abnormality. 2.2 cm mass in the left adrenal gland similar to prior and compatible with metastasis. Musculoskeletal: Multiple vertebral body osseous metastases. Left fifth rib osseous metastasis anteriorly. Review of the MIP images confirms the above findings. IMPRESSION: 1. No acute pulmonary embolism. Abrupt tapering of the right middle lobe pulmonary artery similar to 03/31/2023. 2. Complete atelectasis of the right lung. Large right pleural effusion with ill-defined pleural nodularity similar to PET/CT 04/19/2023. 3. Moderate left pleural effusion with associated atelectasis. 4. Diffuse nodular interlobular septal thickening and bronchial wall thickening in the left lung compatible with lymphangitic carcinomatosis. 5. Layering fluid in the lower trachea incompletely occluding the right mainstem bronchus. 6. Osseous, mediastinal lymph node, and left adrenal gland metastases Aortic Atherosclerosis (ICD10-I70.0). Electronically Signed   By: Minerva Fester M.D.   On: 06/13/2023 22:39   DG Chest Port 1 View  Result Date: 06/13/2023 CLINICAL DATA:  Shortness of breath and increasing weakness EXAM: PORTABLE CHEST 1 VIEW COMPARISON:  Chest radiograph dated 05/18/2023 FINDINGS: Right chest wall port tip projects over the superior cavoatrial junction. Right pleural catheter tip projects over the right apex. Similar asymmetric opacification of the right lung with volume loss resulting in rightward mediastinal shift. Similar interstitial and nodular opacities of the left lung. Presumed large left pleural effusion. Heart borders are obscured. No acute osseous abnormality. IMPRESSION: 1. Similar asymmetric opacification of the  right lung with volume loss resulting in rightward mediastinal shift. 2. Similar interstitial and nodular opacities of the left lung. 3. Presumed large left pleural effusion. Electronically Signed   By: Agustin Cree M.D.   On: 06/13/2023 17:30    Assessment and Plan  Charles Hickman is a 62 y.o. 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  Particularly having shortness of breath with ambulation.   Acute on chronic hypoxic respiratory failure secondary to chronic right lung atelectasis with recurrent pleural effusion and stage IV adenocarcinoma lung with skeletal metastasis with history of COPD on chronic home oxygen Presumed Sepsis -- continue IV steroids -- continue IV empiric antibiotic -- cont right pleural drainage on daily basis -- consider IR to drain left lung pleural effusion if symptoms do not improve -- patient has very poor lung reserve. Continue nebs and PRN inhalers  Metabolic acidosis -- resolved  A fib with RVR acute on chronic in the setting of respiratory failure hypotension -- will start patient on amiodarone drip--now stable--changed to oral amiodarone -- received IV 10 mg diltiazem after patient went into a fib working with PT. -- weaning off IV phenylephrine --cont eliquis  generalized debility, deconditioning, fatigue ability, poor PO intake -- PT OT to see patient -- patient seen by palliative care -- dietitian to see patient  Right sided large pleural effusion/total atelectasis/white out chest x-ray -- patient has pleurx catheter--cont drainage qd  Anemia of chronic disease -- hemoglobin stable. Patient followed by hematology  oncology  Type II diabetes -- continue sliding scale  Chronic pain due to cancer -- PRN pain meds  Thrombocytopenia -- continue to monitor. No active bleeding. Patient not eliquis   Procedures: Family communication : none today Consults : ICU, palliative, oncology CODE STATUS: full DVT Prophylaxis : eliquis Level of care: Stepdown Status is: Inpatient Remains inpatient appropriate because: a fib RVR, acute on chronic hypoxic respiratory failure    TOTAL TIME TAKING CARE OF THIS PATIENT: 35 minutes.  >50% time spent on counselling and coordination of care  Note: This dictation was prepared with Dragon dictation along with smaller phrase technology. Any transcriptional errors that result from this process are unintentional.  Enedina Finner M.D    [facility]   CC: Primary care physician; Dorothey Baseman, MD

## 2023-06-15 NOTE — Consult Note (Signed)
Cardiology Consultation   Patient ID: Charles Hickman MRN: [id number]; DOB: 1961/09/05  Admit date: 06/13/2023 Date of Consult: 06/15/2023  PCP:  Dorothey Baseman, MD   [facility] Providers Cardiologist:  Julien Nordmann, MD        Patient Profile:   Charles Hickman is a 62 y.o. 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 Delton See, NP.  History of Present Illness:   Charles Hickman has a longstanding complex past medical history.  Blood pressure found to be elevated in the emergency department 05/2011 and he has also had a current infarct and carotid atherosclerosis that was found at a CVA with left leg residual that resolved quickly.  He had a previous echocardiogram in 07/2015 during chemotherapy with an EF greater than 50%, aortic sclerosis, normal RVSF. Abnormal stress testing in 2018 he underwent diagnostic left heart catheterization that revealed significant two-vessel CAD.  EF 45-50% with inferior wall hypokinesis.  Catheterization revealed chronically occluded distal RCA with left-to-right collaterals.  75% proximal LAD disease with FFR 0.74 and EF of 45-50%.  He underwent successful DES/PCI to the proximal LAD.  DAPT was recommended for minimum of 6 months with aggressive risk factor modification.  The RCA was left to be treated medically.  Echo 2/22 revealed-60%, no regional wall motion, G1 DD, mild aortic valve  sclerosis without stenosis.  Recent PET scan 9/24 revealed widespread metastatic disease to her lungs and bones.  MRI of the brain revealed a 3 mm focus.  He was to undergo chemotherapy every 21 days and continue to radiation of his left hip and to start with left skull base within the next few weeks.  The plan was to reimage with PET scan at the conclusion of cycle 4.  He continues to follow with oncology.  Patient presents to the [facility] emergency department via EMS from home for shortness of breath.  He did chemo on Friday and got weaker and more short of breath.  He chronically wears 2 L of O2 via nasal cannula at home.  Patient's sats were initially in the lower 90s.  EMS increased his oxygen to 6 L and sats improved to 94 to 96%.  He had reported that he had had a cough and had been feeling worse over the last 3 days.  Initial blood pressure was 80/50 with respirations of 27.  He was given a 250 mL fluid bolus with improvement of his blood pressure and route to the emergency department.  His wife remains at the bedside reports that he been getting weaker and was having increased cough.  Patient decided to come to the emergency room and was condition declined.  He stated he occasionally had palpitations, shortness of breath, and chest discomfort with the A-fib RVR.  Since that time it has resolved.  He continues have shortness of breath and the cough.  But his breathing has not been easier.  Patient stated that he developed atrial fibrillation with RVR while in the emergency department  is given Cardizem x 2, he developed hypotension.  At [facility] was consulted for further management.  He was also found to meet sepsis criteria with concern for pneumonia had blood cultures drawn and broad-spectrum antibiotics given.  With hypotension related to Cardizem he was started on amiodarone and initially required vasopressor support for hypotension.  Initial Vitals: blood pressure 104/74, pulse of 94, respirations of 29,  temperature 98.2  Pertinent labs: Sodium 133, CO2 16, blood glucose 203, BUN 39, lactic acid 3.7, WBCs 10.6, hemoglobin 12.8, INR 2.5  Imaging: Chest x-ray revealed similar asymmetric opacification of the right lung with volume loss resulting in rightward mediastinal shift, similar interstitial nodular opacities of the left lung, presumably large left pleural effusion; CT angio of the chest was negative for pulmonary embolism, abrupt tapering of the right middle lobe pulmonary artery similar to 4/24, complete atelectasis of the right lung, large right pleural effusion with ill-defined pleural nodularity similar to PET/CT, moderate left pleural effusion with associated atelectasis, dense nodular intralobular septal thickening, layering fluid of the lower trachea incompletely occluding the right main bronchus, osseous mediastinal lymph node, and left adrenal gland metastasis  Patient administered in the emergency department normal saline bolus of 1 L, cefepime 2 g IVPB, azithromycin 500 mg IV PB, vancomycin 1250 mg IVPB, and albuterol nebulizer  Cardiology was consulted to help with atrial fibrillation RVR and hypotension after being placed on diltiazem which required amiodarone infusion and vasopressor support.   Past Medical History:  Diagnosis Date   Abnormal EKG 11/15/2017   Abnormal stress test    Atrial fibrillation (HCC)    CAD in native artery    a. 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 No EKG changes concerning for ischemia at peak stress or in recovery. Mod to high risk scan; b. LHC 12/15/17: pLAD 75% w/ FFR 0.74 s/p PCI/DES, mRCA 90%, CTO dRCA w/ L-R colatts, EF 45-50%, inf HK    Diabetes mellitus (HCC)    HCV (hepatitis C virus)    HLD (hyperlipidemia)    Hypertension    Large cell lymphoma (HCC)    a. s/p 6 cycles of R-CHOP   Peripheral  neuropathy    Stroke [facility])    Systolic dysfunction    a. TTE 8/16: EF > 55%, aortic sclerosis, normal RV systolic function; b. LHC 12/15/17: EF 45-50%, inf HK    Past Surgical History:  Procedure Laterality Date   APPENDECTOMY     CORONARY PRESSURE/FFR STUDY N/A 12/15/2017   Procedure: INTRAVASCULAR PRESSURE WIRE/FFR STUDY;  Surgeon: Iran Ouch, MD;  Location: [facility] INVASIVE CV LAB;  Service: Cardiovascular;  Laterality: N/A;   IR GUIDED DRAIN W CATHETER PLACEMENT  05/02/2023   IR IMAGING GUIDED PORT INSERTION  05/02/2023   IR RADIOLOGIST EVAL & MGMT  05/10/2023   IR US GUIDE BX ASP/DRAIN  05/02/2023   LEFT HEART CATH AND CORONARY ANGIOGRAPHY N/A 12/15/2017   Procedure: LEFT HEART CATH AND CORONARY ANGIOGRAPHY;  Surgeon: Iran Ouch, MD;  Location: [facility] INVASIVE CV LAB;  Service: Cardiovascular;  Laterality: N/A;   TONSILLECTOMY AND ADENOIDECTOMY       Home Medications:  Prior to Admission medications   Medication Sig Start Date End Date Taking? Authorizing Provider  acetaminophen (TYLENOL) 500 MG tablet Take 500 mg by mouth every 6 (six) hours as needed.  Yes [provider]  apixaban (ELIQUIS) 5 MG TABS tablet TAKE 1 TABLET BY MOUTH TWICE A DAY 04/12/23  Yes Gollan, Tollie Pizza, MD  calcium carbonate (CALCIUM 600) 600 MG TABS tablet Take 2 tablets (1,200 mg total) by mouth daily. 06/03/23  Yes Alinda Dooms, NP  calcium carbonate ([employer] CALCIUM) 600 MG tablet Take 2 tablets by mouth daily. 06/03/23  Yes [provider]  cholecalciferol (VITAMIN D3) 25 MCG (1000 UNIT) tablet Take 1 tablet (1,000 Units total) by mouth daily. 06/03/23  Yes Alinda Dooms, NP  dexamethasone (DECADRON) 2 MG tablet Take 1 tablet (2 mg total) by mouth daily. 06/03/23  Yes Alinda Dooms, NP  diltiazem (CARDIZEM) 30 MG tablet Take 1 tablet (30 mg total) by mouth daily as needed (for fast heart rate). 05/11/23 05/10/24 Yes Wouk, Wilfred Curtis, MD  fentaNYL (DURAGESIC) 25 MCG/HR Place 1 patch  onto the skin every 3 (three) days. 05/18/23  Yes Jeralyn Ruths, MD  fluconazole (DIFLUCAN) 100 MG tablet Take 1 tablet (100 mg total) by mouth daily. 06/06/23  Yes Borders, Daryl Eastern, NP  JARDIANCE 25 MG TABS tablet TAKE 1 TABLET BY MOUTH EVERY DAY 02/22/22  Yes Worthy Rancher B, FNP  metoprolol succinate (TOPROL-XL) 100 MG 24 hr tablet Take 1 tablet (100 mg total) by mouth daily. TAKE WITH OR IMMEDIATELY FOLLOWING A MEAL. 05/11/23  Yes Wouk, Wilfred Curtis, MD  Multiple Vitamin (MULTI-VITAMIN) tablet Take 1 tablet by mouth daily.   Yes [provider]  naloxone (NARCAN) nasal spray 4 mg/0.1 mL Hickman admin instructions. 04/22/23  Yes [provider]  nitroGLYCERIN (NITROSTAT) 0.4 MG SL tablet Place 1 tablet (0.4 mg total) under the tongue every 5 (five) minutes as needed for chest pain. 05/28/22  Yes Gollan, Tollie Pizza, MD  nystatin (MYCOSTATIN) 100000 UNIT/ML suspension Take 5 mLs (500,000 Units total) by mouth 4 (four) times daily. Swish and spit. 05/12/23  Yes Jeralyn Ruths, MD  ondansetron (ZOFRAN) 8 MG tablet Take 1 tablet (8 mg total) by mouth every 8 (eight) hours as needed for nausea or vomiting. Start on the third day after carboplatin. 04/28/23  Yes Jeralyn Ruths, MD  oxyCODONE (OXY IR/ROXICODONE) 5 MG immediate release tablet Take 1 tablet (5 mg total) by mouth every 6 (six) hours as needed for severe pain. 04/12/23  Yes Jeralyn Ruths, MD  prochlorperazine (COMPAZINE) 10 MG tablet Take 1 tablet (10 mg total) by mouth every 6 (six) hours as needed for nausea or vomiting. 04/28/23  Yes Jeralyn Ruths, MD  rosuvastatin (CRESTOR) 10 MG tablet TAKE 1 TABLET BY MOUTH EVERY DAY 05/26/23  Yes Gollan, Tollie Pizza, MD  blood glucose meter kit and supplies KIT Dispense based on patient and insurance preference. Use 2 times daily as directed. (FOR ICD-10 E11.9). Patient not taking: Reported on 05/09/2023 09/29/20   Glori Luis, MD  lidocaine-prilocaine (EMLA) cream Apply to  affected area once 04/28/23   Jeralyn Ruths, MD  mirtazapine (REMERON) 7.5 MG tablet Take 1 tablet (7.5 mg total) by mouth at bedtime. For mood, appetite, and sleep 06/03/23   Alinda Dooms, NP  [facility] VERIO test strip USE TWICE A DAY AS DIRECTED Patient not taking: Reported on 05/09/2023 11/20/20   Glori Luis, MD    Inpatient Medications: Scheduled Meds:  apixaban  5 mg Oral BID   budesonide (PULMICORT) nebulizer solution  0.5 mg Nebulization BID   Chlorhexidine Gluconate Cloth  6 each Topical Daily  feeding supplement  237 mL Oral BID BM   fentaNYL  1 patch Transdermal Q72H   fluconazole  100 mg Oral Daily   insulin aspart  0-20 Units Subcutaneous Q4H   ipratropium  0.5 mg Nebulization Q6H   levalbuterol  1.25 mg Nebulization Q6H   magic mouthwash w/lidocaine  5 mL Oral QID   methylPREDNISolone (SOLU-MEDROL) injection  40 mg Intravenous Q12H   midodrine  5 mg Oral TID WC   nicotine  14 mg Transdermal Daily   rosuvastatin  10 mg Oral Daily   sodium chloride flush  10-40 mL Intracatheter Q12H   sodium chloride flush  3 mL Intravenous Q12H   Continuous Infusions:  sodium chloride 10 mL/hr at 06/15/23 0807   albumin human Stopped (06/15/23 1610)   amiodarone 30 mg/hr (06/15/23 0807)   ceFEPime (MAXIPIME) IV Stopped (06/15/23 0134)   phenylephrine (NEO-SYNEPHRINE) Adult infusion Stopped (06/15/23 0402)   potassium PHOSPHATE IVPB (in mmol) 64.4 mL/hr at 06/15/23 0807   vancomycin Stopped (06/15/23 0607)   PRN Meds: acetaminophen **OR** acetaminophen, morphine injection, mouth rinse  Allergies:   No Known Allergies  Social History:   Social History   Socioeconomic History   Marital status: Married    Spouse name: Not on file   Number of children: Not on file   Years of education: Not on file   Highest education level: Not on file  Occupational History   Not on file  Tobacco Use   Smoking status: Former    Packs/day: .5    Types: Cigarettes    Quit date:  05/04/2023    Years since quitting: 0.1   Smokeless tobacco: Never   Tobacco comments:    Down to a couple of cigarettes a day  Substance and Sexual Activity   Alcohol use: Yes    Comment: occasional   Drug use: No   Sexual activity: Not on file  Other Topics Concern   Not on file  Social History Narrative   Not on file   Social Determinants of Health   Financial Resource Strain: Low Risk  (04/06/2023)   Overall Financial Resource Strain (CARDIA)    Difficulty of Paying Living Expenses: Not hard at all  Food Insecurity: No Food Insecurity (06/14/2023)   Hunger Vital Sign    Worried About Running Out of Food in the Last Year: Never true    Ran Out of Food in the Last Year: Never true  Transportation Needs: No Transportation Needs (06/14/2023)   PRAPARE - Administrator, Civil Service (Medical): No    Lack of Transportation (Non-Medical): No  Physical Activity: Not on file  Stress: Not on file  Social Connections: Not on file  Intimate Partner Violence: Not At Risk (06/14/2023)   Humiliation, Afraid, Rape, and Kick questionnaire    Fear of Current or Ex-Partner: No    Emotionally Abused: No    Physically Abused: No    Sexually Abused: No    Family History:    Family History  Problem Relation Age of Onset   Hypertension Mother    Hyperlipidemia Mother    Stroke Mother    Diabetes Mother    Kidney disease Father    Hypertension Father    Diabetes Father    Dementia Maternal Grandmother      ROS:  Please Hickman the history of present illness.  Review of Systems  Constitutional:  Positive for malaise/fatigue and weight loss.  Respiratory:  Positive for shortness of breath.  Cardiovascular:  Positive for chest pain, palpitations and leg swelling.  Musculoskeletal:  Positive for back pain.  Neurological:  Positive for weakness.    All other ROS reviewed and negative.     Physical Exam/Data:   Vitals:   06/15/23 0645 06/15/23 0700 06/15/23 0744 06/15/23  0800  BP: 98/61 95/62 (!) 93/57 (!) 89/54  Pulse: 71 65 66 66  Resp: 16 13 10 18   Temp:      TempSrc:      SpO2: 100% 100% 100% 98%  Weight:      Height:        Intake/Output Summary (Last 24 hours) at 06/15/2023 0853 Last data filed at 06/15/2023 0807 Gross per 24 hour  Intake 3433.73 ml  Output 1300 ml  Net 2133.73 ml      06/15/2023    4:04 AM 06/13/2023    4:36 PM 06/03/2023   10:37 AM  Last 3 Weights  Weight (lbs) 150 lb 9.2 oz 147 lb 14.9 oz 148 lb  Weight (kg) 68.3 kg 67.1 kg 67.132 kg     Body mass index is 21.61 kg/m.  General: Ill-appearing, no acute distress HEENT: normal Neck: no JVD Vascular: No carotid bruits; Distal pulses 2+ bilaterally Cardiac:  normal S1, S2; RRR; no murmur  Lungs: Diminished throughout worse on the right with left to auscultation bilaterally, currently on 3 L of O2 via nasal cannula Abd: soft, nontender, no hepatomegaly  Ext: no edema Musculoskeletal:  No deformities, BUE and BLE strength normal and equal Skin: warm and dry  Neuro:  CNs 2-12 intact, no focal abnormalities noted Psych:  Normal affect   EKG:  The EKG was personally reviewed and demonstrates: On arrival was sinus rhythm with rate 92 with LVH and nonspecific T wave abnormalities Telemetry:  Telemetry was personally reviewed and demonstrates: Sinus rhythm rate of 70s converted from atrial fibrillation around midnight  Relevant CV Studies:  Echocardiogram ordered and pending  TTE 01/29/21 1. Left ventricular ejection fraction, by estimation, is 55 to 60%. The  left ventricle has normal function. The left ventricle has no regional  wall motion abnormalities. Left ventricular diastolic parameters are  consistent with Grade I diastolic  dysfunction (impaired relaxation).   2. Right ventricular systolic function is normal. The right ventricular  size is normal.   3. The mitral valve is normal in structure. No evidence of mitral valve  regurgitation.   4. The aortic valve  is tricuspid. Aortic valve regurgitation is not  visualized. Mild aortic valve sclerosis is present, with no evidence of  aortic valve stenosis.   5. The inferior vena cava is normal in size with greater than 50%  respiratory variability, suggesting right atrial pressure of 3 mmHg.    Laboratory Data:  High Sensitivity Troponin:   Recent Labs  Lab 06/14/23 1819 06/14/23 1935 06/15/23 0412  TROPONINIHS 22* 22* 71*     Chemistry Recent Labs  Lab 06/14/23 0547 06/14/23 1819 06/15/23 0412  NA 134* 131* 132*  K 3.6 3.3* 3.1*  CL 104 99 100  CO2 14* 14* 22  GLUCOSE 131* 214* 146*  BUN 23 17 14   CREATININE 0.57* 0.52* 0.38*  CALCIUM 7.6* 7.6* 7.5*  MG  --   --  1.7  GFRNONAA >60 >60 >60  ANIONGAP 16* 18* 10    Recent Labs  Lab 06/13/23 1640 06/14/23 0547 06/15/23 0412  PROT 5.3* 4.9* 5.0*  ALBUMIN 2.5* 2.1* 2.0*  2.0*  AST 19 17 18   ALT  16 16 16   ALKPHOS 123 103 118  BILITOT 1.5* 1.6* 0.9   Lipids No results for input(s): "CHOL", "TRIG", "HDL", "LABVLDL", "LDLCALC", "CHOLHDL" in the last 168 hours.  Hematology Recent Labs  Lab 06/13/23 1640 06/14/23 0547 06/15/23 0412  WBC 10.6* 8.3 10.9*  RBC 4.61 3.84* 3.79*  HGB 12.8* 10.9* 10.8*  HCT 39.8 33.5* 32.9*  MCV 86.3 87.2 86.8  MCH 27.8 28.4 28.5  MCHC 32.2 32.5 32.8  RDW 19.3* 19.7* 19.9*  PLT 87* 75* 95*   Thyroid  Recent Labs  Lab 06/14/23 1639  TSH 0.874    BNP Recent Labs  Lab 06/13/23 1756  BNP 586.1*    DDimer No results for input(s): "DDIMER" in the last 168 hours.   Radiology/Studies:  CT Angio Chest Pulmonary Embolism (PE) W or WO Contrast  Result Date: 06/13/2023 CLINICAL DATA:  Past medical history of stage IV adenocarcinoma of the lung with metastases to the bone admitted on 06/13/2023 with pneumonia. Pleural effusion, known or suspected. PE suspected EXAM: CT ANGIOGRAPHY CHEST WITH CONTRAST TECHNIQUE: Multidetector CT imaging of the chest was performed using the standard protocol  during bolus administration of intravenous contrast. Multiplanar CT image reconstructions and MIPs were obtained to evaluate the vascular anatomy. RADIATION DOSE REDUCTION: This exam was performed according to the departmental dose-optimization program which includes automated exposure control, adjustment of the mA and/or kV according to patient size and/or use of iterative reconstruction technique. CONTRAST:  75mL OMNIPAQUE IOHEXOL 350 MG/ML SOLN COMPARISON:  Chest radiographs 06/13/2023 and PET/CT 04/19/2023 FINDINGS: Cardiovascular: No acute pulmonary embolism. There is abrupt tapering of the right middle lobe pulmonary artery similar to 03/31/2023. Normal heart size. No pericardial effusion. Coronary artery and aortic atherosclerotic calcification. Right chest wall Port-A-Cath. Mediastinum/Nodes: Unremarkable esophagus. Right paratracheal lymph nodes measuring 12 (4/35) and 13 mm (4/44), respectively. Prevascular node on 4/39 measures 8 mm. Left supraclavicular lymphadenopathy measuring up 10 mm on 04/10. These are similar to 04/19/2023. Lungs/Pleura: Layering fluid in the esophagus extending into the right mainstem bronchus which is completely opacified. Complete atelectasis of the right lung. Large right pleural effusion. There is some ill-defined pleural nodularity similar to PET/CT 04/19/2023. Right chest tube. Moderate left pleural effusion associated atelectasis. Diffuse nodular interlobular septal thickening and bronchial wall thickening in the left lung compatible with lymphangitic carcinomatosis. Upper Abdomen: No acute abnormality. 2.2 cm mass in the left adrenal gland similar to prior and compatible with metastasis. Musculoskeletal: Multiple vertebral body osseous metastases. Left fifth rib osseous metastasis anteriorly. Review of the MIP images confirms the above findings. IMPRESSION: 1. No acute pulmonary embolism. Abrupt tapering of the right middle lobe pulmonary artery similar to 03/31/2023. 2.  Complete atelectasis of the right lung. Large right pleural effusion with ill-defined pleural nodularity similar to PET/CT 04/19/2023. 3. Moderate left pleural effusion with associated atelectasis. 4. Diffuse nodular interlobular septal thickening and bronchial wall thickening in the left lung compatible with lymphangitic carcinomatosis. 5. Layering fluid in the lower trachea incompletely occluding the right mainstem bronchus. 6. Osseous, mediastinal lymph node, and left adrenal gland metastases Aortic Atherosclerosis (ICD10-I70.0). Electronically Signed   By: Minerva Fester M.D.   On: 06/13/2023 22:39   DG Chest Port 1 View  Result Date: 06/13/2023 CLINICAL DATA:  Shortness of breath and increasing weakness EXAM: PORTABLE CHEST 1 VIEW COMPARISON:  Chest radiograph dated 05/18/2023 FINDINGS: Right chest wall port tip projects over the superior cavoatrial junction. Right pleural catheter tip projects over the right apex. Similar asymmetric opacification of  the right lung with volume loss resulting in rightward mediastinal shift. Similar interstitial and nodular opacities of the left lung. Presumed large left pleural effusion. Heart borders are obscured. No acute osseous abnormality. IMPRESSION: 1. Similar asymmetric opacification of the right lung with volume loss resulting in rightward mediastinal shift. 2. Similar interstitial and nodular opacities of the left lung. 3. Presumed large left pleural effusion. Electronically Signed   By: Agustin Cree M.D.   On: 06/13/2023 17:30     Assessment and Plan:   Atrial fibrillation with RVR -Patient was found to be in atrial fibrillation RVR in the emergency department -Became hypotensive after Cardizem and transition to amiodarone infusion -Converted from atrial fibrillation to sinus rhythm around midnight -Transitioned off of IV amiodarone and started on amiodarone 400 mg twice daily -Continued on apixaban 5 mg twice daily for stroke prophylaxis -Neo-Synephrine  being weaned off and started on midodrine for blood pressure support -Continue with cardiac monitoring -Patient with longstanding history of paroxysmal atrial fibrillation stating that he has never taken any of his as needed diltiazem  Acute on chronic hypoxic respiratory failure due to right lung atelectasis, recurrent right pleural effusion, stage IV lung adenocarcinoma concern for pneumonia -Continued on chronic oxygen therapy -BNP slightly elevated -Pleural effusions noted on imaging -Patient has Pleurx catheter for recurrent pleural effusion status wife has been draining -Typically drains a daily to every other day will need effusion drained -Asked ICU staff to get appropriate supplies sent from the cancer center to prevent completion of patient's home supplies -Continued antibiotics -Continued steroids and breathing treatments -Continue with supportive care -Continued management per PCCM and IM  Elevated high-sensitivity troponin/coronary artery disease -High-sensitivity troponins trended 22, 22, and 71 -Likely secondary to atrial fibrillation RVR, hypotension, and sepsis -Denies any current chest discomfort -No ischemic changes noted on telemetry -On apixaban and lieu of aspirin and Plavix -Continue statin therapy -EKG as needed for pain or changes  Severe sepsis due to suspected pneumonia -Lactic acid 2.0 -Continue to follow cultures -Continued on antibiotics -Continued on Neo-Synephrine, try to wean off as tolerated as blood pressure allows -Management per IM  Anemia and thrombocytopenia likely secondary to chemotherapy -Hemoglobin 10.8 -Platelets 95 -Remains stable -Transfuse for hemoglobin less than 8 -Daily CBC  Mild hyponatremia likely due to dehydration -Serum sodium 132 -Daily BMP -Monitor/Trend/replete electrolytes as needed  Type 2 diabetes -Continued on insulin therapy -Management per IM  Tobacco abuse -Nicotine patches -Tobacco cessation is  recommended  Stage IV lung adenocarcinoma with mets to the bone -Undergone chemotherapy and radiation -Continues to be followed by oncology   Risk Assessment/Risk Scores:          CHA2DS2-VASc Score = 6   This indicates a 9.7% annual risk of stroke. The patient's score is based upon: CHF History: 1 HTN History: 1 Diabetes History: 1 Stroke History: 2 Vascular Disease History: 1 Age Score: 0 Gender Score: 0         For questions or updates, please contact [facility] Please consult [email or web address] for contact info under    Signed, Salimah Martinovich, NP  06/15/2023 8:53 AM

## 2023-06-15 NOTE — Progress Notes (Addendum)
NAME:  Charles Hickman, MRN:  [id number], DOB:  20-Nov-1961, LOS: 2 ADMISSION DATE:  06/13/2023, CONSULTATION DATE:  06/14/2023 REFERRING MD:  Dr. Enedina Finner, CHIEF COMPLAINT:  Shortness of Breath   Brief Pt Description / Synopsis:  62 y.o. 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  Hospital course complicated by Atrial Fibrillation w/ RVR with associated hypotension.  History of Present Illness:  Charles Hickman is a 62 y.o. male with a past medical history significant for A-fib, CAD, Hypertension, HFrEF, diabetes, hepatitis C, Stage IV Adenocarcinoma of the lung with mets to the bone, large cell lymphoma, recurrent pleural effusions, COPD on 2L supplemental oxygen at baseline, and stroke who presented to [facility] ED on 06/13/23 for evaluation of worsening shortness of breath and O2 saturations as low as 70 at home despite increasing O2.    Over the past few days he has had progressive shortness of breath particularly with ambulation, increased cough, and generalized weakness.   ED Course: Initial Vital Signs: Temperature 98.2 F, BP 104/74, Pulse 94, RR 29, SpO2 94% Significant Labs: hyponatremia of 133 bicarb of 16 glucose 203 normal creatinine anion gap of 18 albumin 2.5 total bili of 1.5 AST ALT within normal limits. Elevated BNP of 586.1, lactic acidosis of 3.7, 3 point 1 repeat, leukocytosis of 10.6 anemia of 12.8 platelet count of 87, INR of 2.5, respiratory panel negative for flu COVID. Urinalysis negative for bacteria leukocytes nitrites WBC 0-5.  EKG showed sinus rhythm at 92 PR interval of 141 nonspecific T wave abnormality in the lateral leads QTc of 469  Imaging Chest  X-ray>>IMPRESSION: 1. Similar asymmetric opacification of the right lung with volume loss resulting in rightward mediastinal shift. 2. Similar interstitial and nodular opacities of the left lung. 3. Presumed large left pleural effusion. CTA Chest>>IMPRESSION: 1. No acute pulmonary embolism. Abrupt tapering of the right middle lobe pulmonary artery similar to 03/31/2023. 2. Complete atelectasis of the right lung. Large right pleural effusion with ill-defined pleural nodularity similar to PET/CT 04/19/2023. 3. Moderate left pleural effusion with associated atelectasis. 4. Diffuse nodular interlobular septal thickening and bronchial wall thickening in the left lung compatible with lymphangitic carcinomatosis. 5. Layering fluid in the lower trachea incompletely occluding the right mainstem bronchus. 6. Osseous, mediastinal lymph node, and left adrenal gland metastases Medications Administered: 1L NS bolus, IV Cefepime/Azithromycin/Vancomycin  He met Sepsis criteria with concern for pneumonia, therefore blood cultures and broad spectrum antibiotics given.  Hospitalist were asked to admit for further workup and treatment.  On 06/14/23 while boarding in the ED he developed atrial fibrillation with RVR and was given Cardizem x2, then developed hypotension.  PCCM consulted.  Please see "Significant Hospital Events" section below for full detailed hospital course.   Pertinent  Medical History   Past Medical History:  Diagnosis Date   Abnormal EKG 11/15/2017   Abnormal stress test    Atrial fibrillation (HCC)    CAD in native artery    a. 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 No EKG changes concerning for ischemia at peak stress or  in recovery. Mod to high risk scan; b. LHC 12/15/17: pLAD 75% w/ FFR 0.74 s/p PCI/DES, mRCA 90%, CTO dRCA w/ L-R colatts, EF  45-50%, inf HK    Diabetes mellitus (HCC)    HCV (hepatitis C virus)    HLD (hyperlipidemia)    Hypertension    Large cell lymphoma (HCC)    a. s/p 6 cycles of R-CHOP   Peripheral neuropathy    Stroke [facility])    Systolic dysfunction    a. TTE 8/16: EF > 55%, aortic sclerosis, normal RV systolic function; b. LHC 12/15/17: EF 45-50%, inf HK    Micro Data:  6/24: COVID/RSV/FLU PCR>>negative 6/24: RVP>> 6/24: Blood cultures x2>> no growth to date 6/25: MRSA PCR>>negative 6/25: Strep pneumo urinary antigen>>negative  6/25: Legionella urinary antigen>> 6/25: Pleural fluid>> 6/25: Sputum>>  Antimicrobials:   Anti-infectives (From admission, onward)    Start     Dose/Rate Route Frequency Ordered Stop   06/14/23 1800  azithromycin (ZITHROMAX) 500 mg in sodium chloride 0.9 % 250 mL IVPB  Status:  Discontinued        500 mg 250 mL/hr over 60 Minutes Intravenous Every 24 hours 06/13/23 2055 06/14/23 1450   06/14/23 1800  vancomycin (VANCOCIN) IVPB 1000 mg/200 mL premix        1,000 mg 200 mL/hr over 60 Minutes Intravenous Every 12 hours 06/14/23 1627     06/14/23 0600  vancomycin (VANCOCIN) IVPB 1000 mg/200 mL premix  Status:  Discontinued        1,000 mg 200 mL/hr over 60 Minutes Intravenous Every 12 hours 06/13/23 2106 06/14/23 1128   06/14/23 0200  ceFEPIme (MAXIPIME) 2 g in sodium chloride 0.9 % 100 mL IVPB        2 g 200 mL/hr over 30 Minutes Intravenous Every 8 hours 06/13/23 2106     06/13/23 2300  fluconazole (DIFLUCAN) tablet 100 mg        100 mg Oral Daily 06/13/23 2136 06/16/23 0959   06/13/23 2145  fluconazole (DIFLUCAN) tablet 100 mg  Status:  Discontinued        100 mg Oral Daily 06/13/23 2136 06/13/23 2136   06/13/23 1815  vancomycin (VANCOREADY) IVPB 1250 mg/250 mL        1,250 mg 166.7 mL/hr over 90 Minutes Intravenous  Once 06/13/23 1801 06/13/23 2108   06/13/23 1800  vancomycin (VANCOCIN) IVPB 1000 mg/200 mL premix  Status:  Discontinued        1,000 mg 200 mL/hr  over 60 Minutes Intravenous  Once 06/13/23 1757 06/13/23 1801   06/13/23 1800  ceFEPIme (MAXIPIME) 2 g in sodium chloride 0.9 % 100 mL IVPB        2 g 200 mL/hr over 30 Minutes Intravenous  Once 06/13/23 1757 06/13/23 1905   06/13/23 1800  azithromycin (ZITHROMAX) 500 mg in sodium chloride 0.9 % 250 mL IVPB        500 mg 250 mL/hr over 60 Minutes Intravenous  Once 06/13/23 1757 06/13/23 1953        Significant Hospital Events: Including procedures, antibiotic start and stop dates in addition to other pertinent events   6/24: Admitted by [facility]. 6/25: Developed A.fib w/ RVR, became hypotensive following Cardizem x2.  PCCM consulted. 6/26: Pt remains on amiodarone gtt currently in NSR.  No longer requiring vasopressors.  If pt remains off vasopressors throughout the day PCCM team will sign off   Interim History / Subjective:  As outlined above under significant events  Objective   Blood pressure 95/62, pulse 65, temperature 97.6 F (36.4 C), temperature source Oral, resp. rate 13, height 5\' 10"  (1.778 m), weight 68.3 kg, SpO2 100 %.        Intake/Output Summary (Last 24 hours) at 06/15/2023 0755 Last data filed at 06/15/2023 0545 Gross per 24 hour  Intake 3009.73 ml  Output 1300 ml  Net 1709.73 ml   Filed Weights   06/13/23 1636 06/15/23 0404  Weight: 67.1 kg 68.3 kg   Examination: General: Acute on chronically ill-appearing frail male, laying in bed, on 3L nasal cannula, no acute distress HENT: Atraumatic, normocephalic, neck supple, no JVD, dry mucous membranes with ulcerations in the roof of his mouth and medial aspect of tongue Lungs: Diminished throughout worse on the right, even, non labored  Cardiovascular: NSR, s1s2, no m/r/g, 2+ radial/1+ distal pulses, 1+ bilateral ankle/feet pitting edema  Abdomen: Soft, nontender, non distended, no guarding or rebound tenderness, bowel sounds positive x4 Extremities: Generalized weakness, no deformities Neuro: Alert and oriented,  following commands, PERRLA  GU: External male catheter in place  Resolved Hospital Problem list     Assessment & Plan:  #Acute on Chronic Hypoxic Respiratory Failure due to Right Lung Atelectasis, recurrent right pleural effusion, Stage IV Adenocarcinoma, and concern for Pneumonia #COPD without acute exacerbation PMHx: COPD on supplemental O2 at baseline, former smoker Has chronic Pleurx catheter in place for approximately 3 to 4 weeks - Supplemental O2 as needed to maintain O2 sats 88 to 92% - BiPAP if needed, wean as tolerated - Follow intermittent Chest X-ray & ABG as needed - Scheduled and prn bronchodilator therapy  - Continue nebulized and IV Steroids (Solumedrol 40 mg BID) - ABX as above - Diuresis as BP and renal function permits - Drainage of pleural effusions via Pleurx catheter as needed - Pulmonary toilet as able  #Atrial Fibrillation w/ RVR, suspect exacerbated by Acute Hypoxic Respiratory Failure~resolved  #Hypotension: Hypovolemic + Cardiogenic vs. Septic vs. related to CCB administration~improving  #Chronic HFpEF without acute exacerbation PMHx: A.fib on Eliquis, HFpEF, CAD, Hypertension Echocardiogram 01/29/21: LVEF 55-60%, Grade I DD, RV systolic function normal - Continuous cardiac monitoring - Gentle IV fluids - Vasopressors as needed to maintain MAP >65 - Continue midodrine 5 mg TID - Repeat troponin  - Echocardiogram pending - TSH/thyroid panel pending  - Continue Amiodarone gtt - Continue outpatient Eliquis - Cardiology consulted appreciate input   #Severe Sepsis due to suspected Pneumonia (Meets SIRS Criteria on 6/25 @ 14:00 : RR 24, HR 161) - Trend WBC and monitor fever curve  - Trend PCT  - Follow cultures  - Continue abx as outlined above pending culture results/sensitivities   #Anemia, suspect due to chemotherapy #Thrombocytopenia, suspect due to chemotherapy - Trend CBC  - Monitor for s/sx of bleeding  - Eliquis for Anticoagulation/VTE  Prophylaxis  - Transfuse for Hgb <7  #Mild Hyponatremia due to dehydration #AG Metabolic Acidosis due to Lactic Acidosis - Trend BMP  - Replace electrolytes  - Avoid nephrotoxic agents as able - Replace electrolytes as indicated ~ Pharmacy following for assistance with electrolyte replacement  #Diabetes Mellitus Type II - CBG's ac & hs; Target range of 140 to 180 - SSI - Follow ICU Hypo/Hyperglycemia protocol  #Stage IV Lung Adenocarcinoma with mets to bone - Oncology consulted appreciate input   Best Practice (right click and "Reselect all SmartList Selections" daily)   Diet/type: Full liquid diet advance as tolerated  DVT prophylaxis: DOAC GI prophylaxis: PPI Lines: Right  chest portacath and still needed  Foley:  N/A Code Status:  full code Last date of multidisciplinary goals of care discussion [06/15/2023]  06/26: Updated pt and pts wife regarding plan of care and all questions answered  Labs   CBC: Recent Labs  Lab 06/13/23 1640 06/14/23 0547 06/15/23 0412  WBC 10.6* 8.3 10.9*  NEUTROABS 8.4*  --   --   HGB 12.8* 10.9* 10.8*  HCT 39.8 33.5* 32.9*  MCV 86.3 87.2 86.8  PLT 87* 75* 95*    Basic Metabolic Panel: Recent Labs  Lab 06/13/23 1640 06/14/23 0547 06/14/23 1819 06/15/23 0412  NA 133* 134* 131* 132*  K 4.3 3.6 3.3* 3.1*  CL 99 104 99 100  CO2 16* 14* 14* 22  GLUCOSE 203* 131* 214* 146*  BUN 39* 23 17 14   CREATININE 0.83 0.57* 0.52* 0.38*  CALCIUM 8.0* 7.6* 7.6* 7.5*  MG  --   --   --  1.7  PHOS  --   --   --  1.3*   GFR: Estimated Creatinine Clearance: 90.1 mL/min (A) (by C-G formula based on SCr of 0.38 mg/dL (L)). Recent Labs  Lab 06/13/23 1640 06/13/23 1831 06/14/23 0547 06/14/23 1819 06/14/23 1935 06/15/23 0412  PROCALCITON  --   --  1.12  --   --  0.59  WBC 10.6*  --  8.3  --   --  10.9*  LATICACIDVEN 3.7* 3.1*  --  2.1* 2.0*  --     Liver Function Tests: Recent Labs  Lab 06/13/23 1640 06/14/23 0547 06/15/23 0412  AST 19  17 18   ALT 16 16 16   ALKPHOS 123 103 118  BILITOT 1.5* 1.6* 0.9  PROT 5.3* 4.9* 5.0*  ALBUMIN 2.5* 2.1* 2.0*  2.0*   No results for input(s): "LIPASE", "AMYLASE" in the last 168 hours. No results for input(s): "AMMONIA" in the last 168 hours.  ABG No results found for: "PHART", "PCO2ART", "PO2ART", "HCO3", "TCO2", "ACIDBASEDEF", "O2SAT"   Coagulation Profile: Recent Labs  Lab 06/13/23 1640  INR 2.5*    Cardiac Enzymes: No results for input(s): "CKTOTAL", "CKMB", "CKMBINDEX", "TROPONINI" in the last 168 hours.  HbA1C: Hemoglobin A1C  Date/Time Value Ref Range Status  01/15/2021 10:29 AM 7.8 (A) 4.0 - 5.6 % Final   Hgb A1c MFr Bld  Date/Time Value Ref Range Status  05/09/2023 04:44 PM 7.3 (H) 4.8 - 5.6 % Final    Comment:    (NOTE) Pre diabetes:          5.7%-6.4%  Diabetes:              >6.4%  Glycemic control for   <7.0% adults with diabetes   09/26/2020 08:47 AM 8.2 (H) 4.6 - 6.5 % Final    Comment:    Glycemic Control Guidelines for People with Diabetes:Non Diabetic:  <6%Goal of Therapy: <7%Additional Action Suggested:  >8%     CBG: Recent Labs  Lab 06/14/23 1817 06/14/23 2131 06/14/23 2355 06/15/23 0358 06/15/23 0743  GLUCAP 216* 207* 197* 133* 177*    Review of Systems:   Positives in BOLD: Gen: Denies fever, chills, weight change, fatigue, night sweats HEENT: Denies blurred vision, double vision, hearing loss, tinnitus, sinus congestion, rhinorrhea, sore throat, neck stiffness, dysphagia PULM: Denies shortness of breath, cough, sputum production, hemoptysis, wheezing CV: Denies chest pain, edema, orthopnea, paroxysmal nocturnal dyspnea, palpitations GI: Denies abdominal pain, nausea, vomiting, diarrhea, hematochezia, melena, constipation, change in bowel habits GU: Denies dysuria, hematuria, polyuria, oliguria, urethral  discharge Endocrine: Denies hot or cold intolerance, polyuria, polyphagia or appetite change Derm: Denies rash, dry skin,  scaling or peeling skin change Heme: Denies easy bruising, bleeding, bleeding gums Neuro: Denies headache, numbness, weakness, slurred speech, loss of memory or consciousness   Past Medical History:  He,  has a past medical history of Abnormal EKG (11/15/2017), Abnormal stress test, Atrial fibrillation (HCC), CAD in native artery, Diabetes mellitus (HCC), HCV (hepatitis C virus), HLD (hyperlipidemia), Hypertension, Large cell lymphoma (HCC), Peripheral neuropathy, Stroke (HCC), and Systolic dysfunction.   Surgical History:   Past Surgical History:  Procedure Laterality Date   APPENDECTOMY     CORONARY PRESSURE/FFR STUDY N/A 12/15/2017   Procedure: INTRAVASCULAR PRESSURE WIRE/FFR STUDY;  Surgeon: Iran Ouch, MD;  Location: [facility] INVASIVE CV LAB;  Service: Cardiovascular;  Laterality: N/A;   IR GUIDED DRAIN W CATHETER PLACEMENT  05/02/2023   IR IMAGING GUIDED PORT INSERTION  05/02/2023   IR RADIOLOGIST EVAL & MGMT  05/10/2023   IR US GUIDE BX ASP/DRAIN  05/02/2023   LEFT HEART CATH AND CORONARY ANGIOGRAPHY N/A 12/15/2017   Procedure: LEFT HEART CATH AND CORONARY ANGIOGRAPHY;  Surgeon: Iran Ouch, MD;  Location: [facility] INVASIVE CV LAB;  Service: Cardiovascular;  Laterality: N/A;   TONSILLECTOMY AND ADENOIDECTOMY       Social History:   reports that he quit smoking about 6 weeks ago. His smoking use included cigarettes. He smoked an average of .5 packs per day. He has never used smokeless tobacco. He reports current alcohol use. He reports that he does not use drugs.   Family History:  His family history includes Dementia in his maternal grandmother; Diabetes in his father and mother; Hyperlipidemia in his mother; Hypertension in his father and mother; Kidney disease in his father; Stroke in his mother.   Allergies No Known Allergies   Home Medications  Prior to Admission medications   Medication Sig Start Date End Date Taking? Authorizing Provider  calcium carbonate ([employer]  CALCIUM) 600 MG tablet Take 2 tablets by mouth daily. 06/03/23  Yes [provider]  acetaminophen (TYLENOL) 500 MG tablet Take 500 mg by mouth every 6 (six) hours as needed.    [provider]  apixaban (ELIQUIS) 5 MG TABS tablet TAKE 1 TABLET BY MOUTH TWICE A DAY 04/12/23   Antonieta Iba, MD  blood glucose meter kit and supplies KIT Dispense based on patient and insurance preference. Use 2 times daily as directed. (FOR ICD-10 E11.9). Patient not taking: Reported on 05/09/2023 09/29/20   Glori Luis, MD  calcium carbonate (CALCIUM 600) 600 MG TABS tablet Take 2 tablets (1,200 mg total) by mouth daily. 06/03/23   Alinda Dooms, NP  cholecalciferol (VITAMIN D3) 25 MCG (1000 UNIT) tablet Take 1 tablet (1,000 Units total) by mouth daily. 06/03/23   Alinda Dooms, NP  dexamethasone (DECADRON) 2 MG tablet Take 1 tablet (2 mg total) by mouth daily. 06/03/23   Alinda Dooms, NP  diltiazem (CARDIZEM) 30 MG tablet Take 1 tablet (30 mg total) by mouth daily as needed (for fast heart rate). 05/11/23 05/10/24  Wouk, Wilfred Curtis, MD  fentaNYL (DURAGESIC) 25 MCG/HR Place 1 patch onto the skin every 3 (three) days. 05/18/23   Jeralyn Ruths, MD  fluconazole (DIFLUCAN) 100 MG tablet Take 1 tablet (100 mg total) by mouth daily. 06/06/23   Borders, Daryl Eastern, NP  JARDIANCE 25 MG TABS tablet TAKE 1 TABLET BY MOUTH EVERY DAY 02/22/22   Hyman Hopes,  Marylouise Stacks, FNP  lidocaine-prilocaine (EMLA) cream Apply to affected area once 04/28/23   Jeralyn Ruths, MD  metoprolol succinate (TOPROL-XL) 100 MG 24 hr tablet Take 1 tablet (100 mg total) by mouth daily. TAKE WITH OR IMMEDIATELY FOLLOWING A MEAL. 05/11/23   Wouk, Wilfred Curtis, MD  mirtazapine (REMERON) 7.5 MG tablet Take 1 tablet (7.5 mg total) by mouth at bedtime. For mood, appetite, and sleep 06/03/23   Alinda Dooms, NP  Multiple Vitamin (MULTI-VITAMIN) tablet Take 1 tablet by mouth daily.    [provider]  naloxone [facility]) nasal  spray 4 mg/0.1 mL See admin instructions. 04/22/23   [provider]  nitroGLYCERIN (NITROSTAT) 0.4 MG SL tablet Place 1 tablet (0.4 mg total) under the tongue every 5 (five) minutes as needed for chest pain. 05/28/22   Antonieta Iba, MD  nystatin (MYCOSTATIN) 100000 UNIT/ML suspension Take 5 mLs (500,000 Units total) by mouth 4 (four) times daily. Swish and spit. 05/12/23   Jeralyn Ruths, MD  ondansetron (ZOFRAN) 8 MG tablet Take 1 tablet (8 mg total) by mouth every 8 (eight) hours as needed for nausea or vomiting. Start on the third day after carboplatin. 04/28/23   Jeralyn Ruths, MD  ONETOUCH VERIO test strip USE TWICE A DAY AS DIRECTED Patient not taking: Reported on 05/09/2023 11/20/20   Glori Luis, MD  oxyCODONE (OXY IR/ROXICODONE) 5 MG immediate release tablet Take 1 tablet (5 mg total) by mouth every 6 (six) hours as needed for severe pain. 04/12/23   Jeralyn Ruths, MD  prochlorperazine (COMPAZINE) 10 MG tablet Take 1 tablet (10 mg total) by mouth every 6 (six) hours as needed for nausea or vomiting. 04/28/23   Jeralyn Ruths, MD  rosuvastatin (CRESTOR) 10 MG tablet TAKE 1 TABLET BY MOUTH EVERY DAY 05/26/23   Antonieta Iba, MD     Critical care time: 35 minutes     Zada Girt, AGNP  Pulmonary/Critical Care Pager [phone] (please enter 7 digits) PCCM Consult Pager [phone] (please enter 7 digits)

## 2023-06-16 ENCOUNTER — Ambulatory Visit: Payer: Managed Care, Other (non HMO)

## 2023-06-16 DIAGNOSIS — J9621 Acute and chronic respiratory failure with hypoxia: Secondary | ICD-10-CM | POA: Diagnosis not present

## 2023-06-16 DIAGNOSIS — I480 Paroxysmal atrial fibrillation: Secondary | ICD-10-CM | POA: Diagnosis not present

## 2023-06-16 LAB — RENAL FUNCTION PANEL
Albumin: 2.6 g/dL — ABNORMAL LOW (ref 3.5–5.0)
Anion gap: 8 (ref 5–15)
BUN: 9 mg/dL (ref 8–23)
CO2: 26 mmol/L (ref 22–32)
Calcium: 8 mg/dL — ABNORMAL LOW (ref 8.9–10.3)
Chloride: 99 mmol/L (ref 98–111)
Creatinine, Ser: 0.31 mg/dL — ABNORMAL LOW (ref 0.61–1.24)
GFR, Estimated: >60 mL/min (ref >60)
Glucose, Bld: 126 mg/dL — ABNORMAL HIGH (ref 70–99)
Phosphorus: 1.7 mg/dL — ABNORMAL LOW (ref 2.5–4.6)
Potassium: 3.2 mmol/L — ABNORMAL LOW (ref 3.5–5.1)
Sodium: 133 mmol/L — ABNORMAL LOW (ref 135–145)

## 2023-06-16 LAB — CBC
HCT: 28.9 % — ABNORMAL LOW (ref 39.0–52.0)
Hemoglobin: 9.4 g/dL — ABNORMAL LOW (ref 13.0–17.0)
MCH: 28.1 pg (ref 26.0–34.0)
MCHC: 32.5 g/dL (ref 30.0–36.0)
MCV: 86.3 fL (ref 80.0–100.0)
Platelets: 67 10*3/uL — ABNORMAL LOW (ref 150–400)
RBC: 3.35 MIL/uL — ABNORMAL LOW (ref 4.22–5.81)
RDW: 19.9 % — ABNORMAL HIGH (ref 11.5–15.5)
WBC: 6.6 10*3/uL (ref 4.0–10.5)
nRBC: 0 % (ref 0.0–0.2)

## 2023-06-16 LAB — LEGIONELLA PNEUMOPHILA SEROGP 1 UR AG: L. pneumophila Serogp 1 Ur Ag: NEGATIVE

## 2023-06-16 LAB — EXPECTORATED SPUTUM ASSESSMENT W GRAM STAIN, RFLX TO RESP C

## 2023-06-16 LAB — CULTURE, BLOOD (ROUTINE X 2)
Culture: NO GROWTH
Special Requests: ADEQUATE

## 2023-06-16 LAB — RESPIRATORY PANEL BY PCR

## 2023-06-16 LAB — GLUCOSE, CAPILLARY
Glucose-Capillary: 102 mg/dL — ABNORMAL HIGH (ref 70–99)
Glucose-Capillary: 145 mg/dL — ABNORMAL HIGH (ref 70–99)
Glucose-Capillary: 175 mg/dL — ABNORMAL HIGH (ref 70–99)
Glucose-Capillary: 183 mg/dL — ABNORMAL HIGH (ref 70–99)
Glucose-Capillary: 153 mg/dL — ABNORMAL HIGH (ref 70–99)

## 2023-06-16 LAB — PROCALCITONIN: Procalcitonin: 0.31 ng/mL

## 2023-06-16 LAB — MAGNESIUM: Magnesium: 2 mg/dL (ref 1.7–2.4)

## 2023-06-16 MED ORDER — METOPROLOL TARTRATE 25 MG PO TABS
12.5000 mg | ORAL_TABLET | Freq: Two times a day (BID) | ORAL | Status: DC
  Administered 2023-06-16 – 2023-06-21 (×10): 12.5 mg via ORAL
  Filled 2023-06-16 (×11): qty 1

## 2023-06-16 MED ORDER — PREDNISONE 20 MG PO TABS
40.0000 mg | ORAL_TABLET | Freq: Every day | ORAL | Status: DC
  Administered 2023-06-17: 40 mg via ORAL
  Filled 2023-06-16: qty 2

## 2023-06-16 MED ORDER — MIDODRINE HCL 5 MG PO TABS
5.0000 mg | ORAL_TABLET | Freq: Three times a day (TID) | ORAL | Status: DC
  Administered 2023-06-16 – 2023-06-21 (×14): 5 mg via ORAL
  Filled 2023-06-16 (×14): qty 1

## 2023-06-16 MED ORDER — AMOXICILLIN-POT CLAVULANATE 875-125 MG PO TABS
1.0000 | ORAL_TABLET | Freq: Two times a day (BID) | ORAL | Status: AC
  Administered 2023-06-17 – 2023-06-20 (×7): 1 via ORAL
  Filled 2023-06-16 (×7): qty 1

## 2023-06-16 MED ORDER — AMIODARONE HCL 200 MG PO TABS
200.0000 mg | ORAL_TABLET | Freq: Two times a day (BID) | ORAL | Status: DC
  Administered 2023-06-20 – 2023-06-21 (×3): 200 mg via ORAL
  Filled 2023-06-16 (×3): qty 1

## 2023-06-16 NOTE — Evaluation (Signed)
Physical Therapy Evaluation Patient Details Name: FELIX MERAS MRN: [id number] DOB: Oct 27, 1961 Today's Date: 06/16/2023  History of Present Illness  62 y.o. 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 Patient also has malignant pleural effusion and is status post Pleurx catheter.  Patient is status post recent pathologic fracture of the left hip after transfer from from wheelchair to the car.  He underwent arthroplasty  at [facility] on 05/21/2023.  He is now admitted 06/13/2023 to the hospital with acute on chronic respiratory failure.  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  Clinical Impression  Pt is a pleasant 62 year old male who was admitted for multiple medical problems including stage IV adenocarcinoma of the right lung and wide spread skeletal metastases. Pt performs bed mobility with mod I, transfers and ambulation not assessed due to medical complications. Performed rolling in bed b/l mod I. Demonstrated AROM with SLR, ankle pumps, and heel slides b/l x1. Pt demonstrates deficits with endurance, mobility, balance, ROM, and strength. Patient left in bed with call bell and alarm set. Pt will continue to receive skilled PT services while admitted and will defer to TOC/care team for updates regarding disposition planning.        Recommendations for follow up therapy are one component of a multi-disciplinary discharge planning process, led by the attending physician.  Recommendations may be updated based on patient status, additional functional criteria and insurance authorization.  Follow Up Recommendations Can patient physically be transported by private vehicle: No     Assistance Recommended at Discharge Frequent or constant Supervision/Assistance   Patient can return home with the following  Two people to help with walking and/or transfers;A lot of help with bathing/dressing/bathroom;Assistance with cooking/housework;Assist for transportation;Help with stairs or ramp for entrance    Equipment Recommendations None recommended by PT  Recommendations for Other Services       Functional Status Assessment Patient has had a recent decline in their functional status and/or demonstrates limited ability to make significant improvements in function in a reasonable and predictable amount of time     Precautions / Restrictions Precautions Precautions: Fall Restrictions Weight Bearing Restrictions: No      Mobility  Bed Mobility Overal bed mobility: Needs Assistance Bed Mobility: Rolling Rolling: Modified independent (Device/Increase time)         General bed mobility comments: use of bed rails for bed mobility    Transfers                   General transfer comment: Unable to stand due to patient comfort and vital signs    Ambulation/Gait               General Gait Details: unable assess  Stairs            Wheelchair Mobility    Modified Rankin (Stroke Patients Only)       Balance                                             Pertinent Vitals/Pain Pain Assessment Pain Assessment: 0-10 Pain Score: 5  Pain Location: everywhere Pain Descriptors / Indicators: Dull, Discomfort Pain Intervention(s): Limited activity within patient's tolerance, Monitored  during session    Home Living Family/patient expects to be discharged to:: Private residence Living Arrangements: Spouse/significant other Available Help at Discharge: Family;Available 24 hours/day Type of Home: House Home Access: Stairs to enter Entrance Stairs-Rails: Can reach both Entrance Stairs-Number of Steps: 2-3   Home Layout: One level Home Equipment: Agricultural consultant (2 wheels);BSC/3in1      Prior Function Prior  Level of Function : Needs assist       Physical Assist : ADLs (physical)   ADLs (physical): IADLs   ADLs Comments: Pt states he is mostly MOD I but on bad days needs his wifes assistance.     Hand Dominance   Dominant Hand: Right    Extremity/Trunk Assessment   Upper Extremity Assessment Upper Extremity Assessment: Generalized weakness    Lower Extremity Assessment Lower Extremity Assessment: Generalized weakness       Communication   Communication: No difficulties  Cognition Arousal/Alertness: Awake/alert Behavior During Therapy: WFL for tasks assessed/performed Overall Cognitive Status: Within Functional Limits for tasks assessed                                          General Comments      Exercises General Exercises - Lower Extremity Ankle Circles/Pumps: AROM, Both, Supine, Other reps (comment) (2) Heel Slides: AROM, Both, Supine (1) Straight Leg Raises: PROM, Both, Other reps (comment), Supine (1)   Assessment/Plan    PT Assessment Patient needs continued PT services  PT Problem List Decreased strength;Decreased range of motion;Decreased activity tolerance;Decreased balance;Decreased mobility;Pain;Cardiopulmonary status limiting activity       PT Treatment Interventions DME instruction;Gait training;Stair training;Functional mobility training;Therapeutic activities;Therapeutic exercise;Balance training;Patient/family education    PT Goals (Current goals can be found in the Care Plan section)  Acute Rehab PT Goals Patient Stated Goal: to get better PT Goal Formulation: With patient Time For Goal Achievement: 06/30/23 Potential to Achieve Goals: Fair    Frequency Min 3X/week     Co-evaluation               AM-PAC PT "6 Clicks" Mobility  Outcome Measure Help needed turning from your back to your side while in a flat bed without using bedrails?: A Lot Help needed moving from lying on your back to sitting on the side of a  flat bed without using bedrails?: A Lot Help needed moving to and from a bed to a chair (including a wheelchair)?: A Lot Help needed standing up from a chair using your arms (e.g., wheelchair or bedside chair)?: A Lot Help needed to walk in hospital room?: A Lot Help needed climbing 3-5 steps with a railing? : A Lot 6 Click Score: 12    End of Session Equipment Utilized During Treatment: Oxygen Activity Tolerance: Patient limited by fatigue Patient left: in bed;with call bell/phone within reach;with bed alarm set Nurse Communication: Mobility status PT Visit Diagnosis: Pain;Muscle weakness (generalized) (M62.81) Pain - Right/Left:  (everywhere) Pain - part of body: Shoulder;Arm;Hand;Knee;Leg    Time: 1345-1410 PT Time Calculation (min) (ACUTE ONLY): 25 min   Charges:              Malachi Carl, SPT   Malachi Carl 06/16/2023, 4:47 PM

## 2023-06-16 NOTE — Progress Notes (Signed)
Rounding Note    Patient Name: Charles Hickman Date of Encounter: 06/16/2023  [facility] Cardiologist: Julien Nordmann, MD   Subjective   He remains in sinus rhythm on oral amiodarone.  Inpatient Medications    Scheduled Meds:  amiodarone  400 mg Oral BID   apixaban  5 mg Oral BID   budesonide (PULMICORT) nebulizer solution  0.5 mg Nebulization BID   Chlorhexidine Gluconate Cloth  6 each Topical Daily   feeding supplement  237 mL Oral TID BM   fentaNYL  1 patch Transdermal Q72H   insulin aspart  0-20 Units Subcutaneous Q4H   ipratropium  0.5 mg Nebulization Q6H   levalbuterol  1.25 mg Nebulization Q6H   magic mouthwash w/lidocaine  5 mL Oral QID   methylPREDNISolone (SOLU-MEDROL) injection  40 mg Intravenous Q12H   metoprolol tartrate  12.5 mg Oral BID   midodrine  10 mg Oral TID WC   multivitamin with minerals  1 tablet Oral Daily   nicotine  14 mg Transdermal Daily   rosuvastatin  10 mg Oral Daily   sodium chloride flush  10-40 mL Intracatheter Q12H   sodium chloride flush  3 mL Intravenous Q12H   Continuous Infusions:  sodium chloride Stopped (06/16/23 0106)   ceFEPime (MAXIPIME) IV 2 g (06/16/23 1037)   PRN Meds: acetaminophen **OR** acetaminophen, morphine injection, mouth rinse   Vital Signs    Vitals:   06/16/23 0700 06/16/23 0759 06/16/23 0800 06/16/23 0900  BP: 123/71  118/78 131/73  Pulse: 87  92 99  Resp:   17 20  Temp:   (!) 97.4 F (36.3 C)   TempSrc:   Oral   SpO2: 100% 100% 97% 98%  Weight:      Height:        Intake/Output Summary (Last 24 hours) at 06/16/2023 1051 Last data filed at 06/16/2023 0900 Gross per 24 hour  Intake 1963.29 ml  Output 2300 ml  Net -336.71 ml      06/16/2023    3:20 AM 06/15/2023    4:04 AM 06/13/2023    4:36 PM  Last 3 Weights  Weight (lbs) 151 lb 7.3 oz 150 lb 9.2 oz 147 lb 14.9 oz  Weight (kg) 68.7 kg 68.3 kg 67.1 kg      Telemetry    Sinus rhythm with intermittent sinus tachycardia-  Personally Reviewed  ECG     - Personally Reviewed  Physical Exam   GEN: No acute distress.   Neck: No JVD Cardiac: RRR, no murmurs, rubs, or gallops.  Respiratory: Diminished breath sounds at the base bilaterally. GI: Soft, nontender, non-distended  MS: No edema; No deformity. Neuro:  Nonfocal  Psych: Normal affect   Labs    High Sensitivity Troponin:   Recent Labs  Lab 06/14/23 1819 06/14/23 1935 06/15/23 0412  TROPONINIHS 22* 22* 71*     Chemistry Recent Labs  Lab 06/13/23 1640 06/14/23 0547 06/14/23 1819 06/15/23 0412 06/15/23 1530 06/16/23 0341  NA 133* 134* 131* 132*  --  133*  K 4.3 3.6 3.3* 3.1* 3.9 3.2*  CL 99 104 99 100  --  99  CO2 16* 14* 14* 22  --  26  GLUCOSE 203* 131* 214* 146*  --  126*  BUN 39* 23 17 14   --  9  CREATININE 0.83 0.57* 0.52* 0.38*  --  0.31*  CALCIUM 8.0* 7.6* 7.6* 7.5*  --  8.0*  MG  --   --   --  1.7  --  2.0  PROT 5.3* 4.9*  --  5.0*  --   --   ALBUMIN 2.5* 2.1*  --  2.0*  2.0*  --  2.6*  AST 19 17  --  18  --   --   ALT 16 16  --  16  --   --   ALKPHOS 123 103  --  118  --   --   BILITOT 1.5* 1.6*  --  0.9  --   --   GFRNONAA >60 >60 >60 >60  --  >60  ANIONGAP 18* 16* 18* 10  --  8    Lipids No results for input(s): "CHOL", "TRIG", "HDL", "LABVLDL", "LDLCALC", "CHOLHDL" in the last 168 hours.  Hematology Recent Labs  Lab 06/14/23 0547 06/15/23 0412 06/16/23 0341  WBC 8.3 10.9* 6.6  RBC 3.84* 3.79* 3.35*  HGB 10.9* 10.8* 9.4*  HCT 33.5* 32.9* 28.9*  MCV 87.2 86.8 86.3  MCH 28.4 28.5 28.1  MCHC 32.5 32.8 32.5  RDW 19.7* 19.9* 19.9*  PLT 75* 95* 67*   Thyroid  Recent Labs  Lab 06/14/23 1639  TSH 0.874    BNP Recent Labs  Lab 06/13/23 1756  BNP 586.1*    DDimer No results for input(s): "DDIMER" in the last 168 hours.   Radiology    ECHOCARDIOGRAM COMPLETE  Result Date: 06/15/2023    ECHOCARDIOGRAM REPORT   Patient Name:   Charles Hickman Date of Exam: 06/15/2023 Medical Rec #:  [id number]     Height:        70.0 in Accession #:    [id number]    Weight:       150.6 lb Date of Birth:  [birth year]     BSA:          1.850 m Patient Age:    62 years      BP:           101/68 mmHg Patient Gender: M             HR:           76 bpm. Exam Location:  [facility] Procedure: 2D Echo, Cardiac Doppler and Color Doppler Indications:     Atrial Fibrillation  History:         Patient has prior history of Echocardiogram examinations, most                  recent 01/29/2021. CAD, Arrythmias:Atrial Fibrillation; Risk                  Factors:Hypertension, Diabetes, Dyslipidemia, Current Smoker                  and Sleep Apnea. Lung CA, Pleural effusion.  Sonographer:     Mikki Harbor Referring Phys:  [id number] Judithe Modest Diagnosing Phys: Debbe Odea MD  Sonographer Comments: Technically challenging study due to limited acoustic windows, no parasternal window, no apical window and suboptimal subcostal window. Image acquisition challenging due to patient body habitus. IMPRESSIONS  1. Consider repeat study, could not visualize any cardiac chamber.. Left ventricular ejection fraction, by estimation, is unable to assess%. The left ventricle has cannot be obtained function. Left ventricular endocardial border not optimally defined to  evaluate regional wall motion. The left ventricular internal cavity size was not visualized. Left ventricular diastolic function could not be evaluated.  2. Right ventricular systolic function grossly normal. The right ventricular size is not well visualized.  3. The  mitral valve was not well visualized. No evidence of mitral valve regurgitation.  4. The aortic valve was not well visualized. Aortic valve regurgitation is not visualized. FINDINGS  Left Ventricle: Consider repeat study, could not visualize any cardiac chamber. Left ventricular ejection fraction, by estimation, is unable to assess%. The left ventricle has cannot be obtained function. Left ventricular endocardial border not optimally  defined to evaluate regional wall motion. The left ventricular internal cavity size was not visualized. Suboptimal image quality limits for assessment of left ventricular hypertrophy. Left ventricular diastolic function could not be evaluated. Right Ventricle: The right ventricular size is not well visualized. Right vetricular wall thickness was not assessed. Right ventricular systolic function grossly normal. Left Atrium: Left atrial size was not well visualized. Right Atrium: Right atrial size was not well visualized. Pericardium: The pericardium was not well visualized. Mitral Valve: The mitral valve was not well visualized. No evidence of mitral valve regurgitation. Tricuspid Valve: The tricuspid valve is grossly normal. Tricuspid valve regurgitation is mild. Aortic Valve: The aortic valve was not well visualized. Aortic valve regurgitation is not visualized. Pulmonic Valve: The pulmonic valve was not well visualized. Pulmonic valve regurgitation is not visualized. Aorta: The aortic root was not well visualized. IAS/Shunts: The interatrial septum was not well visualized.  TRICUSPID VALVE TR Peak grad:   34.8 mmHg TR Vmax:        295.00 cm/s Debbe Odea MD Electronically signed by Debbe Odea MD Signature Date/Time: 06/15/2023/5:17:59 PM    Final     Cardiac Studies   Echocardiogram done yesterday was not diagnostic due to very poor visualization.  Patient Profile     62 y.o. male male with a hx of coronary artery disease status post stent to the LAD and CTO of the distal RCA (11/2017), paroxysmal atrial fibrillation on chronic anticoagulation, hypertension, hyperlipidemia, palpitations, large cell lymphoma, type 2 diabetes, nicotine dependence, CVA, carotid atherosclerosis, HFpEF, stage IV adenocarcinoma of the right lung with mets to the bone, recurrent pleural effusions with Pleurx catheter in place, COPD on supplemental oxygen therapy, acute on chronic respiratory failure due to right lung  atelectasis, who is being seen 06/15/2023 for the evaluation of atrial fibrillation RVR at the request of D. Delton See, NP.   Assessment & Plan    1.  Paroxysmal atrial fibrillation with RVR: He converted to sinus rhythm with IV amiodarone.  He is at high risk for recurrent atrial arrhythmia. Continue oral amiodarone 400 mg twice daily for 5 days and then decrease to 200 mg twice daily. He was on Toprol 50 mg once daily as an outpatient and currently with mild sinus tachycardia.  Given that he is no longer hypotensive, I elected to resume small dose metoprolol tartrate 12.5 mg twice daily. Continue anticoagulation with Eliquis.  2.  Acute on chronic respiratory failure due to atelectasis and right pleural effusion in the setting of stage IV lung adenocarcinoma.  Continue supportive care.  3.  Mild elevated troponin is likely due to supply demand ischemia.  No plans for ischemic cardiac evaluation at this time.  4.  Sepsis: He is no longer hypotensive.  Currently on antibiotics.     For questions or updates, please contact [facility] Please consult [email or web address] for contact info under        Signed, Lorine Bears, MD  06/16/2023, 10:51 AM

## 2023-06-16 NOTE — Progress Notes (Signed)
[facility]  - [place] [facility]   PATIENT NAME: Charles Hickman    MR#:  [id number]  DATE OF BIRTH:  15-Nov-1961  SUBJECTIVE:  no family at bedside. Spoke with wife Charles Hickman on the phone. Patient looks a whole lot better today. Off amiodarone drip off IV visa pressers. Worked a little bit with physical therapy. Quite deconditioned. Wife helps drain through pleurx catheter daily  VITALS:  Blood pressure 118/76, pulse 79, temperature 97.7 F (36.5 C), temperature source Oral, resp. rate 17, height 5\' 10"  (1.778 m), weight 68.7 kg, SpO2 100 %.  PHYSICAL EXAMINATION:   GENERAL:  62 y.o.-year-old patient with no acute distress. Appears chronically ill, debilitated LUNGS: decreased breath sounds more on right than left . Right side Pleurx catheter + CARDIOVASCULAR: S1, S2 normal.no murmur ABDOMEN: Soft, nontender, nondistended. Bowel sounds present.  EXTREMITIES: trace edema b/l.    NEUROLOGIC: nonfocal  patient is alert and awake, weak, deconditioned SKIN:  Pressure Injury 06/14/23 Coccyx Right;Left;Medial Stage 2 -  Partial thickness loss of dermis presenting as a shallow open injury with a red, pink wound bed without slough. Red open area with skin slogging (Active)  06/14/23 1745  Location: Coccyx  Location Orientation: Right;Left;Medial  Staging: Stage 2 -  Partial thickness loss of dermis presenting as a shallow open injury with a red, pink wound bed without slough.  Wound Description (Comments): Red open area with skin slogging  Present on Admission: Yes    LABORATORY PANEL:  CBC Recent Labs  Lab 06/16/23 0341  WBC 6.6  HGB 9.4*  HCT 28.9*  PLT 67*     Chemistries  Recent Labs  Lab 06/15/23 0412 06/15/23 1530 06/16/23 0341  NA 132*  --  133*  K 3.1*   < > 3.2*  CL 100  --  99  CO2 22  --  26  GLUCOSE 146*  --  126*  BUN 14  --  9  CREATININE 0.38*  --  0.31*  CALCIUM 7.5*  --  8.0*  MG 1.7  --  2.0  AST 18  --   --   ALT 16  --   --    ALKPHOS 118  --   --   BILITOT 0.9  --   --    < > = values in this interval not displayed.    Cardiac Enzymes No results for input(s): "TROPONINI" in the last 168 hours. RADIOLOGY:  ECHOCARDIOGRAM COMPLETE  Result Date: 06/15/2023    ECHOCARDIOGRAM REPORT   Patient Name:   Charles Hickman Date of Exam: 06/15/2023 Medical Rec #:  [id number]     Height:       70.0 in Accession #:    [id number]    Weight:       150.6 lb Date of Birth:  1961/02/19     BSA:          1.850 m Patient Age:    62 years      BP:           101/68 mmHg Patient Gender: M             HR:           76 bpm. Exam Location:  [facility] Procedure: 2D Echo, Cardiac Doppler and Color Doppler Indications:     Atrial Fibrillation  History:         Patient has prior history of Echocardiogram examinations, most  recent 01/29/2021. CAD, Arrythmias:Atrial Fibrillation; Risk                  Factors:Hypertension, Diabetes, Dyslipidemia, Current Smoker                  and Sleep Apnea. Lung CA, Pleural effusion.  Sonographer:     Mikki Harbor Referring Phys:  [id number] Judithe Modest Diagnosing Phys: Debbe Odea MD  Sonographer Comments: Technically challenging study due to limited acoustic windows, no parasternal window, no apical window and suboptimal subcostal window. Image acquisition challenging due to patient body habitus. IMPRESSIONS  1. Consider repeat study, could not visualize any cardiac chamber.. Left ventricular ejection fraction, by estimation, is unable to assess%. The left ventricle has cannot be obtained function. Left ventricular endocardial border not optimally defined to  evaluate regional wall motion. The left ventricular internal cavity size was not visualized. Left ventricular diastolic function could not be evaluated.  2. Right ventricular systolic function grossly normal. The right ventricular size is not well visualized.  3. The mitral valve was not well visualized. No evidence of mitral valve regurgitation.   4. The aortic valve was not well visualized. Aortic valve regurgitation is not visualized. FINDINGS  Left Ventricle: Consider repeat study, could not visualize any cardiac chamber. Left ventricular ejection fraction, by estimation, is unable to assess%. The left ventricle has cannot be obtained function. Left ventricular endocardial border not optimally defined to evaluate regional wall motion. The left ventricular internal cavity size was not visualized. Suboptimal image quality limits for assessment of left ventricular hypertrophy. Left ventricular diastolic function could not be evaluated. Right Ventricle: The right ventricular size is not well visualized. Right vetricular wall thickness was not assessed. Right ventricular systolic function grossly normal. Left Atrium: Left atrial size was not well visualized. Right Atrium: Right atrial size was not well visualized. Pericardium: The pericardium was not well visualized. Mitral Valve: The mitral valve was not well visualized. No evidence of mitral valve regurgitation. Tricuspid Valve: The tricuspid valve is grossly normal. Tricuspid valve regurgitation is mild. Aortic Valve: The aortic valve was not well visualized. Aortic valve regurgitation is not visualized. Pulmonic Valve: The pulmonic valve was not well visualized. Pulmonic valve regurgitation is not visualized. Aorta: The aortic root was not well visualized. IAS/Shunts: The interatrial septum was not well visualized.  TRICUSPID VALVE TR Peak grad:   34.8 mmHg TR Vmax:        295.00 cm/s Debbe Odea MD Electronically signed by Debbe Odea MD Signature Date/Time: 06/15/2023/5:17:59 PM    Final     Assessment and Plan  Charles Hickman is a 62 y.o. 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  Particularly having shortness of breath with ambulation.   Acute on chronic hypoxic respiratory failure secondary to chronic right lung atelectasis with recurrent pleural effusion and stage IV adenocarcinoma lung with skeletal metastasis with history of COPD on chronic home oxygen Presumed Sepsis -- continue IV steroids -- continue IV empiric antibiotic -- cont right pleural drainage on daily basis -- consider IR to drain left lung pleural effusion if symptoms do not improve -- patient has very poor lung reserve. Continue nebs and PRN inhalers --6/27--  oral steroids and oral antibiotic.  Metabolic acidosis -- resolved  A fib with RVR acute on chronic in the setting of respiratory failure hypotension -- will start patient on amiodarone drip--now stable--changed to oral amiodarone -- received IV 10 mg diltiazem after patient went into a fib working with PT. -- weaning off IV phenylephrine --cont eliquis -- patient now on oral amiodarone per cardiology recommendation. Appreciate Dr. Kirke Corin as input.  generalized debility, deconditioning, fatigue ability, poor PO intake -- PT OT to see patient -- patient seen by palliative care -- dietitian to see patient  Right sided large pleural effusion/total atelectasis/white out chest x-ray -- patient has pleurx catheter--cont drainage qd  Anemia of chronic disease -- hemoglobin stable. Patient followed by hematology oncology  Type II diabetes -- continue sliding scale  Chronic pain due to cancer -- PRN pain meds  Thrombocytopenia -- continue to monitor. No active bleeding. Patient not eliquis  Overall poor long-term prognosis. Patient is quite deconditioned. Transfer out of ICU  Procedures: Family communication : wife catherine on the phone Consults : ICU, palliative, oncology CODE STATUS: full DVT Prophylaxis :  eliquis Level of care: Progressive Status is: Inpatient Remains inpatient appropriate because: a fib RVR, acute on chronic hypoxic respiratory failure    TOTAL TIME TAKING CARE OF THIS PATIENT: 35 minutes.  >50% time spent on counselling and coordination of care  Note: This dictation was prepared with Dragon dictation along with smaller phrase technology. Any transcriptional errors that result from this process are unintentional.  Enedina Finner M.D    [facility]   CC: Primary care physician; Dorothey Baseman, MD

## 2023-06-17 ENCOUNTER — Ambulatory Visit: Payer: Managed Care, Other (non HMO) | Admitting: Nurse Practitioner

## 2023-06-17 ENCOUNTER — Other Ambulatory Visit: Payer: Managed Care, Other (non HMO)

## 2023-06-17 ENCOUNTER — Ambulatory Visit: Payer: Managed Care, Other (non HMO)

## 2023-06-17 DIAGNOSIS — J9621 Acute and chronic respiratory failure with hypoxia: Secondary | ICD-10-CM | POA: Diagnosis not present

## 2023-06-17 DIAGNOSIS — I480 Paroxysmal atrial fibrillation: Secondary | ICD-10-CM | POA: Diagnosis not present

## 2023-06-17 LAB — CBC
HCT: 32.1 % — ABNORMAL LOW (ref 39.0–52.0)
Hemoglobin: 10.7 g/dL — ABNORMAL LOW (ref 13.0–17.0)
MCH: 29 pg (ref 26.0–34.0)
MCHC: 33.3 g/dL (ref 30.0–36.0)
MCV: 87 fL (ref 80.0–100.0)
Platelets: 106 10*3/uL — ABNORMAL LOW (ref 150–400)
RBC: 3.69 MIL/uL — ABNORMAL LOW (ref 4.22–5.81)
RDW: 19.9 % — ABNORMAL HIGH (ref 11.5–15.5)
WBC: 13.8 10*3/uL — ABNORMAL HIGH (ref 4.0–10.5)
nRBC: 0.1 % (ref 0.0–0.2)

## 2023-06-17 LAB — RENAL FUNCTION PANEL
Albumin: 2.5 g/dL — ABNORMAL LOW (ref 3.5–5.0)
Anion gap: 8 (ref 5–15)
BUN: 8 mg/dL (ref 8–23)
CO2: 28 mmol/L (ref 22–32)
Calcium: 8.3 mg/dL — ABNORMAL LOW (ref 8.9–10.3)
Chloride: 98 mmol/L (ref 98–111)
Creatinine, Ser: 0.41 mg/dL — ABNORMAL LOW (ref 0.61–1.24)
GFR, Estimated: >60 mL/min (ref >60)
Glucose, Bld: 138 mg/dL — ABNORMAL HIGH (ref 70–99)
Phosphorus: 1.4 mg/dL — ABNORMAL LOW (ref 2.5–4.6)
Potassium: 3.1 mmol/L — ABNORMAL LOW (ref 3.5–5.1)
Sodium: 134 mmol/L — ABNORMAL LOW (ref 135–145)

## 2023-06-17 LAB — GLUCOSE, CAPILLARY
Glucose-Capillary: 103 mg/dL — ABNORMAL HIGH (ref 70–99)
Glucose-Capillary: 113 mg/dL — ABNORMAL HIGH (ref 70–99)
Glucose-Capillary: 122 mg/dL — ABNORMAL HIGH (ref 70–99)
Glucose-Capillary: 133 mg/dL — ABNORMAL HIGH (ref 70–99)
Glucose-Capillary: 142 mg/dL — ABNORMAL HIGH (ref 70–99)
Glucose-Capillary: 173 mg/dL — ABNORMAL HIGH (ref 70–99)
Glucose-Capillary: 62 mg/dL — ABNORMAL LOW (ref 70–99)
Glucose-Capillary: 93 mg/dL (ref 70–99)

## 2023-06-17 LAB — CULTURE, BLOOD (ROUTINE X 2)

## 2023-06-17 MED ORDER — IPRATROPIUM BROMIDE 0.02 % IN SOLN
0.5000 mg | Freq: Three times a day (TID) | RESPIRATORY_TRACT | Status: DC
  Administered 2023-06-17 – 2023-06-18 (×2): 0.5 mg via RESPIRATORY_TRACT
  Filled 2023-06-17 (×2): qty 2.5

## 2023-06-17 MED ORDER — POTASSIUM CHLORIDE CRYS ER 20 MEQ PO TBCR
40.0000 meq | EXTENDED_RELEASE_TABLET | Freq: Once | ORAL | Status: AC
  Administered 2023-06-17: 40 meq via ORAL
  Filled 2023-06-17: qty 2

## 2023-06-17 MED ORDER — LEVALBUTEROL HCL 1.25 MG/0.5ML IN NEBU
1.2500 mg | INHALATION_SOLUTION | Freq: Three times a day (TID) | RESPIRATORY_TRACT | Status: DC
  Administered 2023-06-17 – 2023-06-18 (×2): 1.25 mg via RESPIRATORY_TRACT
  Filled 2023-06-17 (×2): qty 0.5

## 2023-06-17 MED ORDER — INSULIN ASPART 100 UNIT/ML IJ SOLN
0.0000 [IU] | INTRAMUSCULAR | Status: DC
  Administered 2023-06-17 – 2023-06-19 (×5): 4 [IU] via SUBCUTANEOUS
  Administered 2023-06-19: 3 [IU] via SUBCUTANEOUS
  Administered 2023-06-20 (×3): 4 [IU] via SUBCUTANEOUS
  Filled 2023-06-17 (×9): qty 1

## 2023-06-17 NOTE — TOC Progression Note (Signed)
Transition of Care [facility]) - Progression Note    Patient Details  Name: Charles Hickman MRN: [id number] Date of Birth: 08/18/1961  Transition of Care [employer] [facility]) CM/SW Contact  Darleene Cleaver, [place] Phone Number: 06/17/2023, 5:15pm  Clinical Narrative:    CSW received message that patient needs home health services.  CSW informed therapist, and physician that because of his insurance it will be very difficult to find an agency that can accept him.  CSW spoke to patient's wife Charles Hickman and informed her that because of his insurance it will be a challenge to find home health for patient.  Patient's wife stated that she was told that patient could receive HH PT, OT, RN, aide, and social worker from physician and therapist.  CSW then informed her that that is not the case and TOC would have to see what agencies are able to provide for patient.  It will depend on what agency is able to accept patient, and what insurance decides he is eligible for.  CSW explained that because of his [employer] there are only a couple of agencies in network.  CSW tried [facility], [facility], [facility], and [facility].  [facility] was the only one that can provide [facility] PT, but that is all she would be able to get from insurance.    Patient's wife requested that a hospital bed be ordered, CSW informed her that it can be ordered, and the DME will coordinate with her when it can be delivered and what the cost may be.  Patient's wife stated that she could not think of any other equipment that he will need.  CSW then asked her if she would consider having patient go to SNF again, she did not like the idea but said she will think about it.  CSW explained the process, what is involved in getting approval for SNF from the insurance company and TOC's role in finding placement.  Patient's wife requested that weekend TOC follow up with her and ask her if she has changed her mind and would like HH instead.      Patient's wife also asked how  can patient get home, CSW informed her that either family can transport or she can use EMS.  Patient's wife asked how much it would be for EMS transport home, CSW said it would depend on his insurance and what has been paid already.  CSW informed her that once the PT comes and completes the evaluation, then patient may be eligible for something else.  Patient's wife also asked about private duty care providers, and she thought insurance would pay for them.  CSW informed her that sometimes insurance companies will provide some aide services, but she will have to contact insurance company to find out.    Patient's wife also asked about the difference between palliative and hospice.  CSW explained the difference, and she said she would like palliative to follow patient once he discharges back home.  She did not have a preference for an agency.  Patient's wife expressed frustration, that she was told he would be able to get all of the home health services, and now she found out she can't get everything requested for patient.  Per patient's wife she would still like to have him come home with home health and palliative to follow outpatient.    TOC to see if patient and wife changed their mind and would like SNF instead.     Expected Discharge Plan and Services  Home with  home health.                                             Social Determinants of Health (SDOH) Interventions SDOH Screenings   Food Insecurity: No Food Insecurity (06/14/2023)  Housing: Low Risk  (06/14/2023)  Transportation Needs: No Transportation Needs (06/14/2023)  Utilities: Not At Risk (06/14/2023)  Depression (PHQ2-9): Low Risk  (12/29/2020)  Financial Resource Strain: Low Risk  (04/06/2023)  Tobacco Use: Medium Risk (06/14/2023)    Readmission Risk Interventions     No data to display

## 2023-06-17 NOTE — TOC Initial Note (Addendum)
Transition of Care [facility]) - Initial/Assessment Note    Patient Details  Name: Charles Hickman MRN: [id number] Date of Birth: June 14, 1961  Transition of Care [facility]) CM/SW Contact:    Darleene Cleaver, LCSW Phone Number: 06/17/2023, 5:00pm  Clinical Narrative:                  CSW was informed that patient will need [facility] services and a hospital bed.  CSW spoke to patient's wife she did not have a preference for [facility] agency.  Cory from [place] was able to accept for [facility] PT only.  Patient has a plurx catheter, that he gets from cancer center.  Wife has been draining it and taking care of it, no HH nurse available.  Expected Discharge Plan: Home w Home Health Services Barriers to Discharge: Continued Medical Work up   Patient Goals and CMS Choice Patient states their goals for this hospitalization and ongoing recovery are:: To return home with home heatlh CMS Medicare.gov Compare Post Acute Care list provided to:: Patient Represenative (must comment) (Patient's wife) Choice offered to / list presented to : Spouse [place] ownership interest in [facility].provided to:: Spouse    Expected Discharge Plan and Services In-house Referral: Clinical Social Work, Hospice / Palliative Care   Post Acute Care Choice: Home Health Living arrangements for the past 2 months: Single Family Home                 DME Arranged: Hospital bed         HH Arranged: PT HH Agency: [facility] Date [facility] Agency Contacted: 06/17/23 Time HH Agency Contacted: 1600 Representative spoke with at [facility] Agency: Kandee Keen  Prior Living Arrangements/Services Living arrangements for the past 2 months: Single Family Home Lives with:: Spouse Patient language and need for interpreter reviewed:: Yes Do you feel safe going back to the place where you live?: Yes      Need for Family Participation in Patient Care: Yes (Comment) Care giver support system in place?: No (comment) Current home services: DME Criminal  Activity/Legal Involvement Pertinent to Current Situation/Hospitalization: No - Comment as needed  Activities of Daily Living Home Assistive Devices/Equipment: Walker (specify type), Oxygen ADL Screening (condition at time of admission) Patient's cognitive ability adequate to safely complete daily activities?: Yes Is the patient deaf or have difficulty hearing?: No Does the patient have difficulty seeing, even when wearing glasses/contacts?: No Does the patient have difficulty concentrating, remembering, or making decisions?: No Patient able to express need for assistance with ADLs?: Yes Does the patient have difficulty dressing or bathing?: Yes Independently performs ADLs?: No Communication: Independent Dressing (OT): Needs assistance Is this a change from baseline?: Change from baseline, expected to last >3 days Grooming: Independent Feeding: Independent Bathing: Needs assistance Is this a change from baseline?: Change from baseline, expected to last >3 days Toileting: Needs assistance Is this a change from baseline?: Change from baseline, expected to last >3days In/Out Bed: Needs assistance Is this a change from baseline?: Change from baseline, expected to last >3 days Walks in Home: Needs assistance Is this a change from baseline?: Change from baseline, expected to last >3 days Does the patient have difficulty walking or climbing stairs?: Yes Weakness of Legs: Both Weakness of Arms/Hands: Both  Permission Sought/Granted Permission sought to share information with : Family Supports Permission granted to share information with : Yes, Release of Information Signed  Share Information with NAME: Charles Hickman, Charles Hickman [phone]  Permission granted  to share info w AGENCY: HH agencies        Emotional Assessment Appearance:: Appears stated age   Affect (typically observed): Calm Orientation: : Oriented to Self, Oriented to Place, Oriented to  Time, Oriented to  Situation Alcohol / Substance Use: Not Applicable    Admission diagnosis:  Acute respiratory failure with hypoxia (HCC) [J96.01] Acute and chronic respiratory failure with hypoxia (HCC) [J96.21] Acute on chronic respiratory failure with hypoxemia (HCC) [J96.21] Patient Active Problem List   Diagnosis Date Noted   Protein-calorie malnutrition, severe 06/15/2023   Palliative care encounter 06/14/2023   Acute on chronic respiratory failure with hypoxemia (HCC) 06/14/2023   Persistent atrial fibrillation (HCC) 06/14/2023   Pleural effusion on right 06/14/2023   Acute and chronic respiratory failure with hypoxia (HCC) 06/13/2023   Cancer, metastatic to bone (HCC) 06/03/2023   Port-A-Cath in place 06/03/2023   Anxiety associated with cancer diagnosis (HCC) 06/03/2023   Pain due to malignant neoplasm metastatic to bone (HCC) 06/03/2023   Pathological fracture of left hip due to neoplastic disease (HCC) 05/19/2023   Atrial fibrillation with rapid ventricular response (HCC) 05/09/2023   Adenocarcinoma, lung, right (HCC) 04/28/2023   Paroxysmal atrial fibrillation (HCC) 02/16/2023   Palpitations 12/29/2020   Allergic rhinitis 02/16/2019   Fatty liver 03/06/2018   History of hepatitis C 03/06/2018   Hypocalcemia 01/06/2018   Chronic low back pain 01/06/2018   CAD (coronary artery disease), native coronary artery 12/29/2017   Systolic dysfunction 12/16/2017   Nicotine dependence, cigarettes, w unsp disorders 12/16/2017   HLD (hyperlipidemia) 12/16/2017   Essential hypertension 10/03/2017   Diabetes (HCC) 10/03/2017   History of lymphoma 10/03/2017   Diffuse large B cell lymphoma (HCC) 06/16/2015   PCP:  Dorothey Baseman, MD Pharmacy:   [facility] [phone] [place], [place] - [address] [place] [address] Phone: [phone] Fax: [phone]  [facility] [address], [place] - [address] [place] [address] Phone: [phone] Fax:  [phone]  [facility] - [place], [place] - [address] [place] [address] Phone: [phone] Fax: [phone]     Social Determinants of Health (SDOH) Social History: SDOH Screenings   Food Insecurity: No Food Insecurity (06/14/2023)  Housing: Low Risk  (06/14/2023)  Transportation Needs: No Transportation Needs (06/14/2023)  Utilities: Not At Risk (06/14/2023)  Depression (PHQ2-9): Low Risk  (12/29/2020)  Financial Resource Strain: Low Risk  (04/06/2023)  Tobacco Use: Medium Risk (06/14/2023)   SDOH Interventions:     Readmission Risk Interventions     No data to display

## 2023-06-17 NOTE — Progress Notes (Signed)
   Patient Name: Charles Hickman Date of Encounter: 06/17/2023 [facility] Cardiologist: Julien Nordmann, MD   Interval Summary  .    Breathing feels a little labored today with some mild chest tightness.  Last Pleurx catheter drain was performed yesterday evening, yielding between 400 and 500 mL before pain developed and fluid removal was stopped.  Mr. Beckham and his wife are concerned about stitches overlying the left hip which have been in place ever since his surgery at [facility]; they request that they be removed.  Vital Signs .    Vitals:   06/17/23 0730 06/17/23 0730 06/17/23 1213 06/17/23 1312  BP:  108/75 103/75   Pulse:  83 80   Resp:  18 18   Temp:   97.6 F (36.4 C)   TempSrc:      SpO2: 97% 97% 97% 96%  Weight:      Height:        Intake/Output Summary (Last 24 hours) at 06/17/2023 1548 Last data filed at 06/17/2023 1531 Gross per 24 hour  Intake 1541.09 ml  Output 1300 ml  Net 241.09 ml      06/16/2023    3:20 AM 06/15/2023    4:04 AM 06/13/2023    4:36 PM  Last 3 Weights  Weight (lbs) 151 lb 7.3 oz 150 lb 9.2 oz 147 lb 14.9 oz  Weight (kg) 68.7 kg 68.3 kg 67.1 kg      Telemetry/ECG    Normal sinus rhythm- Personally Reviewed  Physical Exam .   GEN: No acute distress.   Neck: No JVD Cardiac: RRR, no murmurs, rubs, or gallops.  Respiratory: Diminished breath sounds bilaterally, right greater than the left. GI: Soft, nontender, non-distended  MS: Trace ankle edema bilaterally.  Left hip incision well-approximated with sutures in place.  Assessment & Plan .     Paroxysmal atrial fibrillation: Mr. Depa has continued to maintain sinus rhythm.  I agree with continuation of oral amiodarone to prevent recurrent atrial fibrillation, which she did not tolerate well.  Oral amiodarone loading documented in Dr. Jari Sportsman note from yesterday (amiodarone 400 mg twice daily x 4 more days, followed by 200 mg twice daily x 1 week, then 200 mg daily).  Continue  low-dose metoprolol and apixaban.  Acute on chronic respiratory failure with hypoxia: Ongoing management per internal medicine with pleural fluid removal as tolerated via Pleurx catheter.  Elevated troponin: Felt to be due to supply-demand mismatch in the setting of atrial fibrillation with rapid ventricular response.  Patient is not a good candidate for ischemia evaluation.  I suspect that his atypical chest pain is from combination of supply/demand mismatch and his stage IV adenocarcinoma with bilateral pleural effusions.  Septic shock: Blood pressure has improved.  Continue midodrine at the discretion of primary team.  Sutures: I have spoken with Dr. Allena Katz, who will arrange for suture removal.  Disposition: No further inpatient cardiac needs are present.  Mr. Kisler should continue his amiodarone loading (400 mg p.o. twice daily for 4 more days followed by 200 mg twice daily for 1 week, then 200 mg daily).  He can follow-up with Dr. Mariah Milling or an APP about 2 weeks after hospital discharge.  We will sign off at this time.  For questions or updates, please contact [facility] Please consult [email or web address] for contact info under [facility].     Signed, Yvonne Kendall, MD

## 2023-06-17 NOTE — Progress Notes (Signed)
Hypoglycemic Event  CBG: 62  Treatment: 4 oz juice/soda  Symptoms: None  Follow-up CBG: Time:0556 CBG Result:103  Possible Reasons for Event: Medication regimen: Insulin scheduled  Comments/MD notified:Brenda Mayo Ao, Illene Regulus

## 2023-06-17 NOTE — Inpatient Diabetes Management (Signed)
Inpatient Diabetes Program Recommendations  AACE/ADA: New Consensus Statement on Inpatient Glycemic Control   Target Ranges:  Prepandial:   less than 140 mg/dL      Peak postprandial:   less than 180 mg/dL (1-2 hours)      Critically ill patients:  140 - 180 mg/dL    Latest Reference Range & Units 06/17/23 00:33 06/17/23 05:36 06/17/23 05:56 06/17/23 07:31  Glucose-Capillary 70 - 99 mg/dL 161 (H) 62 (L) 096 (H) 93    Latest Reference Range & Units 06/16/23 07:20 06/16/23 11:18 06/16/23 15:49 06/16/23 21:13  Glucose-Capillary 70 - 99 mg/dL 045 (H) 409 (H) 811 (H) 153 (H)   Review of Glycemic Control  Diabetes history: DM2 Outpatient Diabetes medications: Jardiance 25 mg daily Current orders for Inpatient glycemic control: Novolog 0-20 units Q4H; Prednisone 40 mg QAM  Inpatient Diabetes Program Recommendations:    Insulin: CBG 62 mg/dl at 9:14 am today, likely due to Novolog correction. Please consider decreasing Novolog correction to 0-15 units Q4H (if patient is eating, may want to change frequency of CBGs and Novolog to AC&HS).  Thanks, Orlando Penner, RN, MSN, CDCES Diabetes Coordinator Inpatient Diabetes Program [phone] (Team Pager from 8am to 5pm)

## 2023-06-17 NOTE — Progress Notes (Signed)
[facility]  - [place] [facility]   PATIENT NAME: Charles Hickman    MR#:  [id number]  DATE OF BIRTH:  [birth year]  SUBJECTIVE:  since wife Charles Hickman at bedside.  Patient looks a whole lot better today. Quite deconditioned. Wife helps drain through pleurx catheter daily  VITALS:  Blood pressure 103/75, pulse 80, temperature 97.6 F (36.4 C), resp. rate 18, height 5\' 10"  (1.778 m), weight 68.7 kg, SpO2 96 %.  PHYSICAL EXAMINATION:   GENERAL:  62 y.o.-year-old patient with no acute distress. Appears chronically ill, debilitated LUNGS: decreased breath sounds more on right than left . Right side Pleurx catheter + CARDIOVASCULAR: S1, S2 normal.no murmur ABDOMEN: Soft, nontender, nondistended. Bowel sounds present.  EXTREMITIES: trace edema b/l.    NEUROLOGIC: nonfocal  patient is alert and awake, weak, deconditioned SKIN:  Pressure Injury 06/14/23 Coccyx Right;Left;Medial Stage 2 -  Partial thickness loss of dermis presenting as a shallow open injury with a red, pink wound bed without slough. Red open area with skin slogging (Active)  06/14/23 1745  Location: Coccyx  Location Orientation: Right;Left;Medial  Staging: Stage 2 -  Partial thickness loss of dermis presenting as a shallow open injury with a red, pink wound bed without slough.  Wound Description (Comments): Red open area with skin slogging  Present on Admission: Yes    LABORATORY PANEL:  CBC Recent Labs  Lab 06/17/23 0628  WBC 13.8*  HGB 10.7*  HCT 32.1*  PLT 106*     Chemistries  Recent Labs  Lab 06/15/23 0412 06/15/23 1530 06/16/23 0341 06/17/23 0628  NA 132*  --  133* 134*  K 3.1*   < > 3.2* 3.1*  CL 100  --  99 98  CO2 22  --  26 28  GLUCOSE 146*  --  126* 138*  BUN 14  --  9 8  CREATININE 0.38*  --  0.31* 0.41*  CALCIUM 7.5*  --  8.0* 8.3*  MG 1.7  --  2.0  --   AST 18  --   --   --   ALT 16  --   --   --   ALKPHOS 118  --   --   --   BILITOT 0.9  --   --   --    < > = values  in this interval not displayed.    Cardiac Enzymes No results for input(s): "TROPONINI" in the last 168 hours. RADIOLOGY:  No results found.  Assessment and Plan  Charles Hickman is a 62 y.o. 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  Particularly having shortness of breath with ambulation.   Acute on chronic hypoxic respiratory failure secondary to chronic right lung atelectasis with recurrent pleural effusion and stage IV adenocarcinoma lung with skeletal metastasis with history of COPD on chronic home oxygen Presumed Sepsis -- continue IV steroids -- continue IV empiric antibiotic -- cont right pleural drainage on daily basis -- consider IR to drain left lung pleural effusion if symptoms do not improve -- patient has very poor lung reserve. Continue nebs and PRN inhalers --6/27--  oral steroids and oral antibiotic.  Metabolic acidosis -- resolved  A fib with  RVR acute on chronic in the setting of respiratory failure hypotension -- will start patient on amiodarone drip--now stable--changed to oral amiodarone -- received IV 10 mg diltiazem after patient went into a fib working with PT. -- weaning off IV phenylephrine --cont eliquis -- patient now on oral amiodarone per cardiology recommendation. Appreciate Dr. Kirke Corin as input.  generalized debility, deconditioning, fatigue ability, poor PO intake -- PT OT to see patient -- patient seen by palliative care -- dietitian to see patient  Right sided large pleural effusion/total atelectasis/white out chest x-ray -- patient has pleurx catheter--cont drainage qd  Anemia of chronic disease -- hemoglobin  stable. Patient followed by hematology oncology  Type II diabetes -- continue sliding scale  Chronic pain due to cancer -- PRN pain meds  Thrombocytopenia -- continue to monitor. No active bleeding. Patient not eliquis  Overall poor long-term prognosis. Patient is quite deconditioned.   Discussed at length with patient's wife discharge planning. She prefers taking him home with home health. TOC to maximize home health arrangement. Patient wants to wait till Monday for discharge.  Family communication : wife Charles Hickman at bedside Consults : ICU, palliative, oncology CODE STATUS: full DVT Prophylaxis : eliquis Level of care: Progressive Status is: Inpatient Remains inpatient appropriate because: a fib RVR, acute on chronic hypoxic respiratory failure    TOTAL TIME TAKING CARE OF THIS PATIENT: 35 minutes.  >50% time spent on counselling and coordination of care  Note: This dictation was prepared with Dragon dictation along with smaller phrase technology. Any transcriptional errors that result from this process are unintentional.  Enedina Finner M.D    [facility]   CC: Primary care physician; Dorothey Baseman, MD

## 2023-06-17 NOTE — Progress Notes (Signed)
Physical Therapy Treatment Patient Details Name: Charles Hickman MRN: [id number] DOB: March 15, 1961 Today's Date: 06/17/2023   History of Present Illness 62 y.o. 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 Patient also has malignant pleural effusion and is status post Pleurx catheter.  Patient is status post recent pathologic fracture of the left hip after transfer from from wheelchair to the car.  He underwent arthroplasty  at [facility] on 05/21/2023.  He is now admitted 06/13/2023 to the hospital with acute on chronic respiratory failure.  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    PT Comments    Patient sitting up in bed upon arrival and states that he is tired from last night. Patient unwilling to sit EOB due to previous experience. SPT encouraged patient that sitting up EOB is safe and important for him to return home, patient continued to refuse. Performed bed level exercises, AROM quad sets x 5, AAROM SLR x 10 and AROM ankle pumps x 15. Assisted patient in setting up breakfast tray that arrived during session. Left patient in bed with call bell and alarm set. Communicated with care team via secure chat regarding discharge recommendation.    Recommendations for follow up therapy are one component of a multi-disciplinary discharge planning process, led by the attending physician.  Recommendations may be updated based on patient status, additional functional criteria and insurance authorization.  Follow Up Recommendations  Can patient physically be transported by private vehicle: No    Assistance Recommended at Discharge Frequent or constant Supervision/Assistance  Patient can return home with the following Two people to help with walking and/or transfers;A  lot of help with bathing/dressing/bathroom;Assistance with cooking/housework;Assist for transportation;Help with stairs or ramp for entrance   Equipment Recommendations  None recommended by PT    Recommendations for Other Services       Precautions / Restrictions Precautions Precautions: Fall Restrictions Weight Bearing Restrictions: No     Mobility  Bed Mobility Overal bed mobility: Needs Assistance             General bed mobility comments: Not formally assessed due to patient fatigue.    Transfers                   General transfer comment: Unable to stand due to patient comfort    Ambulation/Gait               General Gait Details: unable assess   Stairs             Wheelchair Mobility    Modified Rankin (Stroke Patients Only)       Balance                                            Cognition Arousal/Alertness: Awake/alert Behavior During Therapy: WFL for tasks assessed/performed Overall Cognitive Status: Within Functional Limits for tasks assessed                                          Exercises General Exercises - Lower Extremity Ankle Circles/Pumps: AROM, Both, 15 reps, Supine Quad Sets: AROM, Both,  5 reps, Supine Straight Leg Raises: AAROM, Both, 10 reps, Supine    General Comments        Pertinent Vitals/Pain Pain Assessment Pain Assessment: No/denies pain    Home Living                          Prior Function            PT Goals (current goals can now be found in the care plan section) Acute Rehab PT Goals Patient Stated Goal: to get better PT Goal Formulation: With patient Time For Goal Achievement: 06/30/23 Potential to Achieve Goals: Fair Progress towards PT goals: Progressing toward goals    Frequency    Min 3X/week      PT Plan Discharge plan needs to be updated    Co-evaluation              AM-PAC PT "6 Clicks" Mobility   Outcome  Measure  Help needed turning from your back to your side while in a flat bed without using bedrails?: A Little Help needed moving from lying on your back to sitting on the side of a flat bed without using bedrails?: A Little Help needed moving to and from a bed to a chair (including a wheelchair)?: A Lot Help needed standing up from a chair using your arms (e.g., wheelchair or bedside chair)?: A Lot Help needed to walk in hospital room?: A Lot Help needed climbing 3-5 steps with a railing? : A Lot 6 Click Score: 14    End of Session Equipment Utilized During Treatment: Oxygen Activity Tolerance: Patient limited by fatigue Patient left: in bed;with call bell/phone within reach;with bed alarm set Nurse Communication: Mobility status PT Visit Diagnosis: Pain;Muscle weakness (generalized) (M62.81) Pain - Right/Left:  (everywhere) Pain - part of body: Shoulder;Arm;Hand;Knee;Leg     Time: 9629-5284 PT Time Calculation (min) (ACUTE ONLY): 13 min  Charges:  $Therapeutic Activity: 8-22 mins                     Malachi Carl, SPT    Malachi Carl 06/17/2023, 12:30 PM

## 2023-06-17 NOTE — Progress Notes (Signed)
Occupational Therapy Treatment Patient Details Name: Charles Hickman MRN: [id number] DOB: July 10, 1961 Today's Date: 06/17/2023   History of present illness 62 y.o. 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 Patient also has malignant pleural effusion and is status post Pleurx catheter.  Patient is status post recent pathologic fracture of the left hip after transfer from from wheelchair to the car.  He underwent arthroplasty  at [facility] on 05/21/2023.  He is now admitted 06/13/2023 to the hospital with acute on chronic respiratory failure.  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   OT comments  Upon entering the room, pt supine in bed and agreeable to OT intervention with wife present. Pt's wife with several questions regarding discharge recommendation. Pt's wife reports that she has been his caregiver recently and is still learning but they want him to return home with [facility] services. OT talked to them about hospital bed recommendation. OT educated and demonstrates proper technique and good body mechanics with use of hospital bed for rolling for self care needs and discussion of multiple ways to perform toileting needs depending on situation and pt's needs for safety. Pt declined to attempt EOB this session secondary to fatigue and pain. OT notified TOC and MD of pt's questions and wishes as discussed during this session.    Recommendations for follow up therapy are one component of a multi-disciplinary discharge planning process, led by the attending physician.  Recommendations may be updated based on patient status, additional functional criteria and insurance authorization.    Assistance Recommended at Discharge Intermittent Supervision/Assistance  Patient can  return home with the following  A little help with walking and/or transfers;A little help with bathing/dressing/bathroom;Assistance with cooking/housework;Assist for transportation;Help with stairs or ramp for entrance   Equipment Recommendations  Hospital bed       Precautions / Restrictions Precautions Precautions: Fall       Mobility Bed Mobility               General bed mobility comments: Not formally assessed due to patient fatigue.    Transfers                             ADL either performed or assessed with clinical judgement    Extremity/Trunk Assessment Upper Extremity Assessment Upper Extremity Assessment: Generalized weakness   Lower Extremity Assessment Lower Extremity Assessment: Generalized weakness        Vision Patient Visual Report: No change from baseline            Cognition Arousal/Alertness: Awake/alert Behavior During Therapy: WFL for tasks assessed/performed Overall Cognitive Status: Within Functional Limits for tasks assessed                                                     Pertinent Vitals/ Pain       Pain Assessment Pain Assessment: Faces Faces Pain Scale: Hurts little more Pain Location: generalized Pain Descriptors / Indicators: Discomfort Pain Intervention(s): Monitored during session, Premedicated before session, Repositioned         Frequency  Min 2X/week        Progress  Toward Goals  OT Goals(current goals can now be found in the care plan section)  Progress towards OT goals: Progressing toward goals     Plan Discharge plan remains appropriate;Frequency remains appropriate       AM-PAC OT "6 Clicks" Daily Activity     Outcome Measure   Help from another person eating meals?: None Help from another person taking care of personal grooming?: A Little Help from another person toileting, which includes using toliet, bedpan, or urinal?: A Lot Help from another person  bathing (including washing, rinsing, drying)?: A Lot Help from another person to put on and taking off regular upper body clothing?: A Little Help from another person to put on and taking off regular lower body clothing?: A Lot 6 Click Score: 16    End of Session    OT Visit Diagnosis: Unsteadiness on feet (R26.81);Repeated falls (R29.6);Muscle weakness (generalized) (M62.81)   Activity Tolerance Patient limited by pain;Patient limited by fatigue   Patient Left in bed;with call bell/phone within reach;with bed alarm set;with family/visitor present   Nurse Communication Mobility status;Other (comment) (purewick malfunction)        Time: 2536-6440 OT Time Calculation (min): 29 min  Charges: OT General Charges $OT Visit: 1 Visit OT Treatments $Self Care/Home Management : 23-37 mins  Jackquline Denmark, MS, OTR/L , CBIS ascom [phone]  06/17/23, 4:26 PM

## 2023-06-18 DIAGNOSIS — I480 Paroxysmal atrial fibrillation: Secondary | ICD-10-CM | POA: Diagnosis not present

## 2023-06-18 DIAGNOSIS — J9621 Acute and chronic respiratory failure with hypoxia: Secondary | ICD-10-CM | POA: Diagnosis not present

## 2023-06-18 LAB — RENAL FUNCTION PANEL
Albumin: 2.4 g/dL — ABNORMAL LOW (ref 3.5–5.0)
Anion gap: 8 (ref 5–15)
BUN: 7 mg/dL — ABNORMAL LOW (ref 8–23)
CO2: 31 mmol/L (ref 22–32)
Calcium: 8.5 mg/dL — ABNORMAL LOW (ref 8.9–10.3)
Chloride: 97 mmol/L — ABNORMAL LOW (ref 98–111)
Creatinine, Ser: 0.37 mg/dL — ABNORMAL LOW (ref 0.61–1.24)
GFR, Estimated: >60 mL/min (ref >60)
Glucose, Bld: 121 mg/dL — ABNORMAL HIGH (ref 70–99)
Phosphorus: 1.8 mg/dL — ABNORMAL LOW (ref 2.5–4.6)
Potassium: 5 mmol/L (ref 3.5–5.1)
Sodium: 136 mmol/L (ref 135–145)

## 2023-06-18 LAB — CBC
HCT: 39.9 % (ref 39.0–52.0)
Hemoglobin: 12.7 g/dL — ABNORMAL LOW (ref 13.0–17.0)
MCH: 28.3 pg (ref 26.0–34.0)
MCHC: 31.8 g/dL (ref 30.0–36.0)
MCV: 89.1 fL (ref 80.0–100.0)
Platelets: 100 10*3/uL — ABNORMAL LOW (ref 150–400)
RBC: 4.48 MIL/uL (ref 4.22–5.81)
RDW: 20.7 % — ABNORMAL HIGH (ref 11.5–15.5)
WBC: 16.3 10*3/uL — ABNORMAL HIGH (ref 4.0–10.5)
nRBC: 0.3 % — ABNORMAL HIGH (ref 0.0–0.2)

## 2023-06-18 LAB — GLUCOSE, CAPILLARY
Glucose-Capillary: 114 mg/dL — ABNORMAL HIGH (ref 70–99)
Glucose-Capillary: 122 mg/dL — ABNORMAL HIGH (ref 70–99)
Glucose-Capillary: 126 mg/dL — ABNORMAL HIGH (ref 70–99)
Glucose-Capillary: 156 mg/dL — ABNORMAL HIGH (ref 70–99)
Glucose-Capillary: 156 mg/dL — ABNORMAL HIGH (ref 70–99)
Glucose-Capillary: 191 mg/dL — ABNORMAL HIGH (ref 70–99)
Glucose-Capillary: 70 mg/dL (ref 70–99)

## 2023-06-18 LAB — CULTURE, BLOOD (ROUTINE X 2): Culture: NO GROWTH

## 2023-06-18 MED ORDER — IPRATROPIUM BROMIDE 0.02 % IN SOLN
0.5000 mg | Freq: Two times a day (BID) | RESPIRATORY_TRACT | Status: DC
  Administered 2023-06-18 – 2023-06-21 (×5): 0.5 mg via RESPIRATORY_TRACT
  Filled 2023-06-18 (×6): qty 2.5

## 2023-06-18 MED ORDER — LEVALBUTEROL HCL 1.25 MG/0.5ML IN NEBU
1.2500 mg | INHALATION_SOLUTION | Freq: Two times a day (BID) | RESPIRATORY_TRACT | Status: DC
  Administered 2023-06-18 – 2023-06-21 (×5): 1.25 mg via RESPIRATORY_TRACT
  Filled 2023-06-18 (×6): qty 0.5

## 2023-06-18 NOTE — TOC Progression Note (Signed)
Transition of Care [facility]) - Progression Note    Patient Details  Name: Charles Hickman MRN: [id number] Date of Birth: 1961-05-15  Transition of Care [facility]) CM/SW Contact  Dannielle Karvonen Phone Number: 06/18/2023, 12:34 PM  Clinical Narrative:     CSW spoke with pt's wife [name] concerning dc plans, she states she is not sure pt would be agreeable to SNF as he has declined it in the past. Spouse states she is concerned about pt's port getting infected at a SNF and prefers to bring pt home, declines workup for SNF. Spouse continues to express frustration about the lack of benefits pt's insurance provides, CSW discussed alternative options for DME such as [employer] for bed pads and gowns along with other supplies. Spouse requesting hospital bed, CSW spoke with [facility], she states there needs to be a medical necessity note placed in the chart, she confirmed Rosann Auerbach will cover the entire cost of the hospital bed, Jasmine states she will call pt's spouse to be updated on delivery.    Expected Discharge Plan: Home w Home Health Services Barriers to Discharge: Continued Medical Work up  Expected Discharge Plan and Services In-house Referral: Clinical Social Work, Hospice / Palliative Care   Post Acute Care Choice: Home Health Living arrangements for the past 2 months: Single Family Home                 DME Arranged: Hospital bed         HH Arranged: PT HH Agency: [facility] Date [facility] Agency Contacted: 06/17/23 Time HH Agency Contacted: 1600 Representative spoke with at [facility] Agency: Kandee Keen   Social Determinants of Health (SDOH) Interventions SDOH Screenings   Food Insecurity: No Food Insecurity (06/14/2023)  Housing: Low Risk  (06/14/2023)  Transportation Needs: No Transportation Needs (06/14/2023)  Utilities: Not At Risk (06/14/2023)  Depression (PHQ2-9): Low Risk  (12/29/2020)  Financial Resource Strain: Low Risk  (04/06/2023)  Tobacco Use: Medium Risk (06/14/2023)     Readmission Risk Interventions     No data to display

## 2023-06-18 NOTE — TOC Progression Note (Signed)
Transition of Care [facility]) - Progression Note    Patient Details  Name: Charles Hickman MRN: [id number] Date of Birth: 08-04-1961  Transition of Care [facility]) CM/SW Contact  Carmina Miller, LCSWA Phone Number: 06/18/2023, 12:51 PM  Clinical Narrative:        Durable Medical Equipment  (From admission, onward)           Start     Ordered   06/18/23 0930  For home use only DME Hospital bed  Once       Question Answer Comment  Length of Need Lifetime   Bed type Semi-electric      06/18/23 0865            The patient requires the ability to reposition frequently.   Expected Discharge Plan: Home w Home Health Services Barriers to Discharge: Continued Medical Work up  Expected Discharge Plan and Services In-house Referral: Clinical Social Work, Hospice / Palliative Care   Post Acute Care Choice: Home Health Living arrangements for the past 2 months: Single Family Home                 DME Arranged: Hospital bed         HH Arranged: PT HH Agency: [facility] Date [facility] Agency Contacted: 06/17/23 Time HH Agency Contacted: 1600 Representative spoke with at [facility] Agency: Kandee Keen   Social Determinants of Health (SDOH) Interventions SDOH Screenings   Food Insecurity: No Food Insecurity (06/14/2023)  Housing: Low Risk  (06/14/2023)  Transportation Needs: No Transportation Needs (06/14/2023)  Utilities: Not At Risk (06/14/2023)  Depression (PHQ2-9): Low Risk  (12/29/2020)  Financial Resource Strain: Low Risk  (04/06/2023)  Tobacco Use: Medium Risk (06/14/2023)    Readmission Risk Interventions     No data to display

## 2023-06-18 NOTE — Progress Notes (Signed)
Left hip suture removed per MD order.

## 2023-06-18 NOTE — Progress Notes (Signed)
[facility]  - [place] [facility]   PATIENT NAME: Charles Hickman    MR#:  [id number]  DATE OF BIRTH:  September 02, 1961  SUBJECTIVE:  Spoke with wife [name] on the phone  Patient looks a whole lot better today. Quite deconditioned. Wife helps drain  pleurx catheter daily  VITALS:  Blood pressure 102/77, pulse (!) 106, temperature (!) 97.5 F (36.4 C), resp. rate 15, height 5\' 10"  (1.778 m), weight 68.7 kg, SpO2 96 %.  PHYSICAL EXAMINATION:   GENERAL:  62 y.o.-year-old patient with no acute distress. Appears chronically ill, debilitated LUNGS: decreased breath sounds more on right than left . Right side Pleurx catheter + CARDIOVASCULAR: S1, S2 normal.no murmur ABDOMEN: Soft, nontender, nondistended. Bowel sounds present.  EXTREMITIES: trace edema b/l.    NEUROLOGIC: nonfocal  patient is alert and awake, weak, deconditioned SKIN:  Pressure Injury 06/14/23 Coccyx Right;Left;Medial Stage 2 -  Partial thickness loss of dermis presenting as a shallow open injury with a red, pink wound bed without slough. Red open area with skin slogging (Active)  06/14/23 1745  Location: Coccyx  Location Orientation: Right;Left;Medial  Staging: Stage 2 -  Partial thickness loss of dermis presenting as a shallow open injury with a red, pink wound bed without slough.  Wound Description (Comments): Red open area with skin slogging  Present on Admission: Yes    LABORATORY PANEL:  CBC Recent Labs  Lab 06/18/23 0625  WBC 16.3*  HGB 12.7*  HCT 39.9  PLT 100*     Chemistries  Recent Labs  Lab 06/15/23 0412 06/15/23 1530 06/16/23 0341 06/17/23 0628 06/18/23 0625  NA 132*  --  133*   < > 136  K 3.1*   < > 3.2*   < > 5.0  CL 100  --  99   < > 97*  CO2 22  --  26   < > 31  GLUCOSE 146*  --  126*   < > 121*  BUN 14  --  9   < > 7*  CREATININE 0.38*  --  0.31*   < > 0.37*  CALCIUM 7.5*  --  8.0*   < > 8.5*  MG 1.7  --  2.0  --   --   AST 18  --   --   --   --   ALT 16  --   --    --   --   ALKPHOS 118  --   --   --   --   BILITOT 0.9  --   --   --   --    < > = values in this interval not displayed.    Cardiac Enzymes No results for input(s): "TROPONINI" in the last 168 hours. RADIOLOGY:  No results found.  Assessment and Plan  KEMARI BALBIN is a 62 y.o. 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  Particularly having shortness of breath with ambulation.   Acute on chronic hypoxic respiratory failure secondary to chronic right lung atelectasis with recurrent pleural effusion and stage IV adenocarcinoma lung with skeletal metastasis with history of COPD on chronic home oxygen Presumed Sepsis -- continue IV steroids -- continue  IV empiric antibiotic -- cont right pleural drainage on daily basis -- consider IR to drain left lung pleural effusion if symptoms do not improve -- patient has very poor lung reserve. Continue nebs and PRN inhalers --6/27--  oral steroids and oral antibiotic.  Metabolic acidosis -- resolved  A fib with RVR acute on chronic in the setting of respiratory failure hypotension -- will start patient on amiodarone drip--now stable--changed to oral amiodarone -- received IV 10 mg diltiazem after patient went into a fib working with PT. -- weaned off IV phenylephrine --cont eliquis -- patient now on oral amiodarone per cardiology recommendation. Appreciate Dr. Kirke Corin as input.  generalized debility, deconditioning, fatigue ability, poor PO intake -- PT OT to see patient-- d/w wife pt will d/c to home with PT -- patient seen by palliative care -- dietitian to see patient  Right sided large pleural effusion/total  atelectasis/white out chest x-ray -- patient has pleurx catheter--cont drainage qd  Anemia of chronic disease -- hemoglobin stable. Patient followed by hematology oncology  Type II diabetes -- continue sliding scale  Chronic pain due to cancer -- PRN pain meds  Thrombocytopenia -- No active bleeding.   Overall poor long-term prognosis. Patient is quite deconditioned.   Discussed at length with patient's wife discharge planning. She prefers taking him home with home health. TOC to maximize home health arrangement--per [facility] only PT covered with insurance Patient wants to wait till Monday for discharge. D/w wife to consider private help if   Family communication : wife catherine at bedside Consults : ICU, palliative, oncology CODE STATUS: full DVT Prophylaxis : eliquis Level of care: Progressive Status is: Inpatient Remains inpatient appropriate because: a fib RVR, acute on chronic hypoxic respiratory failure    TOTAL TIME TAKING CARE OF THIS PATIENT: 35 minutes.  >50% time spent on counselling and coordination of care  Note: This dictation was prepared with Dragon dictation along with smaller phrase technology. Any transcriptional errors that result from this process are unintentional.  Enedina Finner M.D    [facility]   CC: Primary care physician; Dorothey Baseman, MD

## 2023-06-19 DIAGNOSIS — I480 Paroxysmal atrial fibrillation: Secondary | ICD-10-CM | POA: Diagnosis not present

## 2023-06-19 DIAGNOSIS — J9621 Acute and chronic respiratory failure with hypoxia: Secondary | ICD-10-CM | POA: Diagnosis not present

## 2023-06-19 LAB — RENAL FUNCTION PANEL
Albumin: 2.2 g/dL — ABNORMAL LOW (ref 3.5–5.0)
Anion gap: 6 (ref 5–15)
BUN: 9 mg/dL (ref 8–23)
CO2: 31 mmol/L (ref 22–32)
Calcium: 7.9 mg/dL — ABNORMAL LOW (ref 8.9–10.3)
Chloride: 95 mmol/L — ABNORMAL LOW (ref 98–111)
Creatinine, Ser: <0.3 mg/dL — ABNORMAL LOW (ref 0.61–1.24)
Glucose, Bld: 91 mg/dL (ref 70–99)
Phosphorus: 2.5 mg/dL (ref 2.5–4.6)
Potassium: 3.4 mmol/L — ABNORMAL LOW (ref 3.5–5.1)
Sodium: 132 mmol/L — ABNORMAL LOW (ref 135–145)

## 2023-06-19 LAB — CBC
HCT: 37.9 % — ABNORMAL LOW (ref 39.0–52.0)
Hemoglobin: 12.4 g/dL — ABNORMAL LOW (ref 13.0–17.0)
MCH: 28.5 pg (ref 26.0–34.0)
MCHC: 32.7 g/dL (ref 30.0–36.0)
MCV: 87.1 fL (ref 80.0–100.0)
Platelets: 122 10*3/uL — ABNORMAL LOW (ref 150–400)
RBC: 4.35 MIL/uL (ref 4.22–5.81)
RDW: 20.2 % — ABNORMAL HIGH (ref 11.5–15.5)
WBC: 17 10*3/uL — ABNORMAL HIGH (ref 4.0–10.5)
nRBC: 0.3 % — ABNORMAL HIGH (ref 0.0–0.2)

## 2023-06-19 LAB — GLUCOSE, CAPILLARY
Glucose-Capillary: 111 mg/dL — ABNORMAL HIGH (ref 70–99)
Glucose-Capillary: 120 mg/dL — ABNORMAL HIGH (ref 70–99)
Glucose-Capillary: 122 mg/dL — ABNORMAL HIGH (ref 70–99)
Glucose-Capillary: 168 mg/dL — ABNORMAL HIGH (ref 70–99)
Glucose-Capillary: 59 mg/dL — ABNORMAL LOW (ref 70–99)
Glucose-Capillary: 83 mg/dL (ref 70–99)
Glucose-Capillary: 98 mg/dL (ref 70–99)

## 2023-06-19 NOTE — Plan of Care (Signed)
  Problem: Education: Goal: Knowledge of General Education information will improve Description: Including pain rating scale, medication(s)/side effects and non-pharmacologic comfort measures Outcome: Progressing   Problem: Health Behavior/Discharge Planning: Goal: Ability to manage health-related needs will improve Outcome: Progressing   Problem: Clinical Measurements: Goal: Ability to maintain clinical measurements within normal limits will improve Outcome: Progressing Goal: Will remain free from infection Outcome: Progressing Goal: Diagnostic test results will improve Outcome: Progressing Goal: Respiratory complications will improve Outcome: Progressing Goal: Cardiovascular complication will be avoided Outcome: Progressing   Problem: Activity: Goal: Risk for activity intolerance will decrease Outcome: Progressing   Problem: Nutrition: Goal: Adequate nutrition will be maintained Outcome: Progressing   Problem: Coping: Goal: Level of anxiety will decrease Outcome: Progressing   Problem: Elimination: Goal: Will not experience complications related to bowel motility Outcome: Progressing Goal: Will not experience complications related to urinary retention Outcome: Progressing   Problem: Pain Managment: Goal: General experience of comfort will improve Outcome: Progressing   Problem: Safety: Goal: Ability to remain free from injury will improve Outcome: Progressing   Problem: Skin Integrity: Goal: Risk for impaired skin integrity will decrease Outcome: Progressing   Problem: Education: Goal: Ability to describe self-care measures that may prevent or decrease complications (Diabetes Survival Skills Education) will improve Outcome: Progressing   Problem: Coping: Goal: Ability to adjust to condition or change in health will improve Outcome: Progressing   Problem: Fluid Volume: Goal: Ability to maintain a balanced intake and output will improve Outcome:  Progressing   Problem: Health Behavior/Discharge Planning: Goal: Ability to identify and utilize available resources and services will improve Outcome: Progressing Goal: Ability to manage health-related needs will improve Outcome: Progressing   Problem: Metabolic: Goal: Ability to maintain appropriate glucose levels will improve Outcome: Progressing   Problem: Nutritional: Goal: Maintenance of adequate nutrition will improve Outcome: Progressing Goal: Progress toward achieving an optimal weight will improve Outcome: Progressing   Problem: Skin Integrity: Goal: Risk for impaired skin integrity will decrease Outcome: Progressing   Problem: Tissue Perfusion: Goal: Adequacy of tissue perfusion will improve Outcome: Progressing   

## 2023-06-19 NOTE — Progress Notes (Signed)
[facility]  - [facility]   PATIENT NAME: Charles Hickman    MR#:  [id number]  DATE OF BIRTH:  [birth year]  SUBJECTIVE:  Spoke with wife [name] on the phone  Patient remains stable  overall deconditioned. Wife helps drain  pleurx catheter daily  VITALS:  Blood pressure 102/71, pulse 99, temperature 98.3 F (36.8 C), temperature source Oral, resp. rate 18, height 5\' 10"  (1.778 m), weight 68.7 kg, SpO2 97 %.  PHYSICAL EXAMINATION:   GENERAL:  62 y.o.-year-old patient with no acute distress. Appears chronically ill, debilitated LUNGS: decreased breath sounds more on right than left . Right side Pleurx catheter + CARDIOVASCULAR: S1, S2 normal.no murmur ABDOMEN: Soft, nontender, nondistended EXTREMITIES: trace edema b/l.    NEUROLOGIC: nonfocal  patient is alert and awake, weak, deconditioned SKIN:  Pressure Injury 06/14/23 Coccyx Right;Left;Medial Stage 2 -  Partial thickness loss of dermis presenting as a shallow open injury with a red, pink wound bed without slough. Red open area with skin slogging (Active)  06/14/23 1745  Location: Coccyx  Location Orientation: Right;Left;Medial  Staging: Stage 2 -  Partial thickness loss of dermis presenting as a shallow open injury with a red, pink wound bed without slough.  Wound Description (Comments): Red open area with skin slogging  Present on Admission: Yes    LABORATORY PANEL:  CBC Recent Labs  Lab 06/19/23 0700  WBC 17.0*  HGB 12.4*  HCT 37.9*  PLT 122*     Chemistries  Recent Labs  Lab 06/15/23 0412 06/15/23 1530 06/16/23 0341 06/17/23 0628 06/19/23 0700  NA 132*  --  133*   < > 132*  K 3.1*   < > 3.2*   < > 3.4*  CL 100  --  99   < > 95*  CO2 22  --  26   < > 31  GLUCOSE 146*  --  126*   < > 91  BUN 14  --  9   < > 9  CREATININE 0.38*  --  0.31*   < > <0.30*  CALCIUM 7.5*  --  8.0*   < > 7.9*  MG 1.7  --  2.0  --   --   AST 18  --   --   --   --   ALT 16  --   --   --   --   ALKPHOS  118  --   --   --   --   BILITOT 0.9  --   --   --   --    < > = values in this interval not displayed.     Assessment and Plan  SHREE KERNER is a 62 y.o. 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  Particularly having shortness of breath with ambulation.   Acute on chronic hypoxic respiratory failure secondary to chronic right lung atelectasis with recurrent pleural effusion and stage IV adenocarcinoma lung with skeletal metastasis with history of COPD on chronic home oxygen Presumed Sepsis -- continue IV steroids -- continue IV empiric antibiotic -- cont right pleural drainage on daily basis by wife --- patient has very poor lung reserve. Continue nebs and  PRN inhalers - on oral steroids and oral antibiotic.  Metabolic acidosis -- resolved  A fib with RVR acute on chronic in the setting of respiratory failure hypotension -- will start patient on amiodarone drip--now stable--changed to oral amiodarone -- received IV 10 mg diltiazem after patient went into a fib working with PT. -- weaned off IV phenylephrine --cont eliquis -- patient now on oral amiodarone per cardiology recommendation. Appreciate Dr. Kirke Corin as input.  generalized debility, deconditioning, fatigue ability, poor PO intake -- PT OT to see patient-- d/w wife pt will d/c to home with PT -- patient seen by palliative care -- dietitian to see patient  Right sided large pleural effusion/total atelectasis/white out chest x-ray -- patient has pleurx catheter--cont drainage qd  Anemia of chronic disease -- hemoglobin stable. Patient followed by hematology oncology  Type II diabetes --  continue sliding scale  Chronic pain due to cancer -- PRN pain meds  Thrombocytopenia -- No active bleeding.   Overall poor long-term prognosis. Patient is quite deconditioned.   Discussed at length with patient's wife discharge planning. She prefers taking him home with home health. TOC to maximize home health arrangement--per [facility] only PT covered with insurance Patient wants to wait till Monday for discharge. D/w wife to consider private help  Family communication : none today Consults : ICU, palliative, oncology CODE STATUS: full DVT Prophylaxis : eliquis Level of care: Progressive Status is: Inpatient Remains inpatient appropriate because: a fib RVR, acute on chronic hypoxic respiratory failure    TOTAL TIME TAKING CARE OF THIS PATIENT: 35 minutes.  >50% time spent on counselling and coordination of care  Note: This dictation was prepared with Dragon dictation along with smaller phrase technology. Any transcriptional errors that result from this process are unintentional.  Enedina Finner M.D    [facility]   CC: Primary care physician; Dorothey Baseman, MD

## 2023-06-20 ENCOUNTER — Ambulatory Visit: Payer: Managed Care, Other (non HMO)

## 2023-06-20 ENCOUNTER — Other Ambulatory Visit: Payer: Self-pay | Admitting: Oncology

## 2023-06-20 ENCOUNTER — Other Ambulatory Visit: Payer: Self-pay | Admitting: Hospice and Palliative Medicine

## 2023-06-20 ENCOUNTER — Inpatient Hospital Stay: Payer: Managed Care, Other (non HMO)

## 2023-06-20 DIAGNOSIS — J9621 Acute and chronic respiratory failure with hypoxia: Secondary | ICD-10-CM | POA: Diagnosis not present

## 2023-06-20 LAB — GLUCOSE, CAPILLARY
Glucose-Capillary: 109 mg/dL — ABNORMAL HIGH (ref 70–99)
Glucose-Capillary: 153 mg/dL — ABNORMAL HIGH (ref 70–99)
Glucose-Capillary: 163 mg/dL — ABNORMAL HIGH (ref 70–99)
Glucose-Capillary: 180 mg/dL — ABNORMAL HIGH (ref 70–99)
Glucose-Capillary: 73 mg/dL (ref 70–99)
Glucose-Capillary: 90 mg/dL (ref 70–99)
Glucose-Capillary: 95 mg/dL (ref 70–99)

## 2023-06-20 MED ORDER — LEVALBUTEROL HCL 1.25 MG/0.5ML IN NEBU: 1.2500 mg | INHALATION_SOLUTION | Freq: Two times a day (BID) | RESPIRATORY_TRACT | 12 refills | Status: DC

## 2023-06-20 MED ORDER — IPRATROPIUM BROMIDE 0.02 % IN SOLN: 0.5000 mg | Freq: Two times a day (BID) | RESPIRATORY_TRACT | 12 refills | Status: DC

## 2023-06-20 MED ORDER — OXYCODONE HCL 5 MG PO TABS: 5.0000 mg | ORAL_TABLET | ORAL | 0 refills | Status: DC | PRN

## 2023-06-20 MED ORDER — AMIODARONE HCL 200 MG PO TABS: 200.0000 mg | ORAL_TABLET | Freq: Two times a day (BID) | ORAL | 1 refills | Status: DC

## 2023-06-20 MED ORDER — OXYCODONE HCL 5 MG PO TABS: 5.0000 mg | ORAL_TABLET | Freq: Four times a day (QID) | ORAL | 0 refills | Status: DC | PRN

## 2023-06-20 MED ORDER — NICOTINE 14 MG/24HR TD PT24: 14.0000 mg | MEDICATED_PATCH | Freq: Every day | TRANSDERMAL | 0 refills | Status: DC

## 2023-06-20 MED ORDER — METOPROLOL SUCCINATE ER 25 MG PO TB24: 25.0000 mg | ORAL_TABLET | Freq: Every day | ORAL | 0 refills | Status: DC

## 2023-06-20 MED ORDER — MIDODRINE HCL 5 MG PO TABS: 5.0000 mg | ORAL_TABLET | Freq: Three times a day (TID) | ORAL | 0 refills | Status: DC

## 2023-06-20 NOTE — Progress Notes (Signed)
[facility] Donnella Bi called me just now to say that they are unable to pick up pt until 10 a.m. tomorrow morning due to their service ending at 7 p.m. and they still have 2 other calls to go pick up and deliver to their homes. She apologized and said they have no choice and the best they can do is 10 a.m. tomorrow morning.   Charge nurse Noreene Larsson notified. Called wife twice but no answer; left voice message. Pt's port-a-cath is still in place.

## 2023-06-20 NOTE — Progress Notes (Signed)
Asked pt if his DME equipment arrived and he said his wife hadn't called him to notify him and that she said she would when it arrived.

## 2023-06-20 NOTE — TOC Transition Note (Signed)
Transition of Care [facility]) - CM/SW Discharge Note   Patient Details  Name: Charles Hickman MRN: [id number] Date of Birth: 09-08-1961  Transition of Care [facility]) CM/SW Contact:  Margarito Liner, LCSW Phone Number: 06/20/2023, 4:42 PM   Clinical Narrative:  Patient has orders to discharge home today. Wife will call the unit when the bed is delivered so that the RN can call EMS transport. EMS paperwork is in the discharge packet. No further concerns. CSW signing off.   Final next level of care: Home w Home Health Services Barriers to Discharge: Barriers Resolved   Patient Goals and CMS Choice CMS Medicare.gov Compare Post Acute Care list provided to:: Patient Represenative (must comment) (Patient's wife) Choice offered to / list presented to : Spouse  Discharge Placement                  Patient to be transferred to facility by: EMS Name of family member notified: Jerry Caras Patient and family notified of of transfer: 06/20/23  Discharge Plan and Services Additional resources added to the After Visit Summary for   In-house Referral: Clinical Social Work, Hospice / Palliative Care   Post Acute Care Choice: Home Health          DME Arranged: Nebulizer machine, Hospital bed DME Agency: [employer] Date DME Agency Contacted: 06/20/23   Representative spoke with at DME Agency: Cecilio Asper HH Arranged: PT HH Agency: [facility] Date [facility] Agency Contacted: 06/20/23 Time HH Agency Contacted: 1600 Representative spoke with at [facility] Agency: Cindie Silmon  Social Determinants of Health (SDOH) Interventions SDOH Screenings   Food Insecurity: No Food Insecurity (06/14/2023)  Housing: Low Risk  (06/14/2023)  Transportation Needs: No Transportation Needs (06/14/2023)  Utilities: Not At Risk (06/14/2023)  Depression (PHQ2-9): Low Risk  (12/29/2020)  Financial Resource Strain: Low Risk  (04/06/2023)  Tobacco Use: Medium Risk (06/14/2023)     Readmission Risk Interventions     No  data to display

## 2023-06-20 NOTE — Progress Notes (Signed)
Wife never called to notify us that all the DME supplies arrived. I called her at 6:55 p.m. just to find out that yes the supplies arrived and she was making up the bed, getting dinner for herself, etc. Called for transport.

## 2023-06-20 NOTE — Discharge Summary (Addendum)
Physician Discharge Summary   Patient: Charles Hickman MRN: [id number] DOB: May 01, 1961  Admit date:     06/13/2023  Discharge date: 06/20/23  Discharge Physician: Enedina Finner   PCP: Dorothey Baseman, MD   Recommendations at discharge:   Use your oxygen, nebulizer as before continue your daily pleurex catheter drainage as before follow-up Dr. Orlie Dakin at the cancer center on your   Discharge Diagnoses: Principal Problem:   Acute and chronic respiratory failure with hypoxia (HCC) Active Problems:   Nicotine dependence, cigarettes, w unsp disorders   Essential hypertension   Diabetes (HCC)   CAD (coronary artery disease), native coronary artery   Paroxysmal atrial fibrillation [facility])   Palliative care encounter   Acute on chronic respiratory failure with hypoxemia (HCC)   Persistent atrial fibrillation (HCC)   Pleural effusion on right   Protein-calorie malnutrition, severe  Charles Hickman is a 62 y.o. 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  Particularly having shortness of breath with ambulation.    Acute on chronic hypoxic respiratory failure secondary to chronic right lung atelectasis with recurrent pleural effusion and stage IV adenocarcinoma lung with skeletal metastasis with history of COPD on chronic home oxygen Presumed Sepsis -- continue IV steroids and empiric antibiotic -- cont right pleural drainage on daily basis by wife --- patient has very poor lung reserve. Continue nebs and PRN inhalers - on oral steroids and oral antibiotic. --dr Orlie Dakin aware pt going home   Metabolic acidosis -- resolved   A fib with RVR acute on chronic in the  setting of respiratory failure hypotension -- will start patient on amiodarone drip--now stable--changed to oral amiodarone -- received IV 10 mg diltiazem after patient went into a fib working with PT. -- weaned off IV phenylephrine --cont eliquis -- patient now on oral amiodarone per cardiology recommendation. Appreciate Dr. Kirke Corin as input.   generalized debility, deconditioning, fatigue ability, poor PO intake -- PT OT to see patient-- d/w wife pt will d/c to home with PT -- patient seen by palliative care -- dietitian to see patient   Right sided large pleural effusion/total atelectasis/white out chest x-ray -- patient has pleurx catheter--cont drainage qd   Anemia of chronic disease -- hemoglobin stable. Patient followed by hematology oncology   Type II diabetes -- continue sliding scale   Chronic pain due to cancer -- PRN pain meds   Thrombocytopenia -- No active bleeding.   Nutrition Status: Nutrition Problem: Severe Malnutrition Etiology: chronic illness (stage IV lung cancer with mets to bone) Signs/Symptoms: moderate fat depletion, severe fat depletion, moderate muscle depletion, severe muscle depletion, percent weight loss Interventions: Ensure Enlive (each supplement provides 350kcal and 20 grams of protein), MVI, Magic cup     Overall poor long-term prognosis. Patient is quite deconditioned.    Discussed at length with patient's wife discharge planning. She prefers taking him home with home health. TOC to maximize home health arrangement--per [facility] only PT covered with insurance Patient wants to wait till Monday for discharge. D/w wife to consider private help   Family communication : none today Consults : ICU, palliative, oncology, cardiology CODE STATUS: full DVT Prophylaxis : eliquis     Pain control - [place] Controlled Substance Reporting System database was reviewed. and patient was instructed, not to drive, operate heavy machinery, perform  activities at heights, swimming or participation in water activities or provide baby-sitting services while on Pain, Sleep and Anxiety Medications; until their outpatient Physician has advised to do so again. Also recommended to not to take more than prescribed Pain, Sleep and Anxiety Medications.  Disposition: Home health Diet recommendation:  Discharge Diet Orders (From admission, onward)     Start     Ordered   06/20/23 0000  Diet - low sodium heart healthy        06/20/23 1318          DISCHARGE MEDICATION: Allergies as of 06/20/2023   No Known Allergies      Medication List     STOP taking these medications    blood glucose meter kit and supplies Kit   calcium carbonate 600 MG Tabs tablet Commonly known as: Calcium 600   dexamethasone 2 MG tablet Commonly known as: DECADRON   diltiazem 30 MG tablet Commonly known as: Cardizem   fluconazole 100 MG tablet Commonly known as: DIFLUCAN   OneTouch Verio test strip Generic drug: glucose blood       TAKE these medications    acetaminophen 500 MG tablet Commonly known as: TYLENOL Take 500 mg by mouth every 6 (six) hours as needed.   amiodarone 200 MG tablet Commonly known as: PACERONE Take 1 tablet (200 mg total) by mouth 2 (two) times daily.   cholecalciferol 25 MCG (1000 UNIT) tablet Commonly known as: VITAMIN D3 Take 1 tablet (1,000 Units total) by mouth daily.   [employer] Calcium 600 MG tablet Generic drug: calcium carbonate Take 2 tablets by mouth daily.   Eliquis 5 MG Tabs tablet Generic drug: apixaban TAKE 1 TABLET BY MOUTH TWICE A DAY   fentaNYL 25 MCG/HR Commonly known as: DURAGESIC Place 1 patch onto the skin every 3 (three) days.   ipratropium 0.02 % nebulizer solution Commonly known as: ATROVENT Take 2.5 mLs (0.5 mg total) by nebulization 2 (two) times daily.   Jardiance 25 MG Tabs tablet Generic drug: empagliflozin TAKE 1 TABLET BY MOUTH EVERY DAY   levalbuterol 1.25 MG/0.5ML nebulizer  solution Commonly known as: XOPENEX Take 1.25 mg by nebulization 2 (two) times daily.   lidocaine-prilocaine cream Commonly known as: EMLA Apply to affected area once   metoprolol succinate 25 MG 24 hr tablet Commonly known as: TOPROL-XL Take 1 tablet (25 mg total) by mouth daily. TAKE WITH OR IMMEDIATELY FOLLOWING A MEAL. What changed:  medication strength how much to take   midodrine 5 MG tablet Commonly known as: PROAMATINE Take 1 tablet (5 mg total) by mouth 3 (three) times daily with meals.   mirtazapine 7.5 MG tablet Commonly known as: REMERON Take 1 tablet (7.5 mg total) by mouth at bedtime. For mood, appetite, and sleep   Multi-Vitamin tablet Take 1 tablet by mouth daily.   naloxone 4 MG/0.1ML Liqd nasal spray kit Commonly known as: NARCAN See admin instructions.   nicotine 14 mg/24hr patch Commonly known as: NICODERM CQ - dosed in mg/24 hours Place 1 patch (14 mg total) onto the skin daily. Start taking on: June 21, 2023   nitroGLYCERIN 0.4 MG SL tablet Commonly known as: NITROSTAT Place 1 tablet (0.4 mg total) under the tongue every 5 (five) minutes as needed for chest pain.   nystatin 100000 UNIT/ML suspension Commonly known as: MYCOSTATIN Take 5 mLs (500,000 Units total) by mouth  4 (four) times daily. Swish and spit.   ondansetron 8 MG tablet Commonly known as: Zofran Take 1 tablet (8 mg total) by mouth every 8 (eight) hours as needed for nausea or vomiting. Start on the third day after carboplatin.   oxyCODONE 5 MG immediate release tablet Commonly known as: Oxy IR/ROXICODONE Take 1-2 tablets (5-10 mg total) by mouth every 4 (four) hours as needed for severe pain. What changed:  how much to take when to take this   prochlorperazine 10 MG tablet Commonly known as: COMPAZINE Take 1 tablet (10 mg total) by mouth every 6 (six) hours as needed for nausea or vomiting.   rosuvastatin 10 MG tablet Commonly known as: CRESTOR TAKE 1 TABLET BY MOUTH EVERY  DAY               Durable Medical Equipment  (From admission, onward)           Start     Ordered   06/20/23 1418  For home use only DME Hospital bed  Once       Comments: Fully Electric bed  Question Answer Comment  Length of Need Lifetime   Bed type Semi-electric   Support Surface: Gel Overlay      06/20/23 1417   06/20/23 0000  For home use only DME Nebulizer machine       Question Answer Comment  Patient needs a nebulizer to treat with the following condition COPD (chronic obstructive pulmonary disease) (HCC)   Length of Need Lifetime      06/20/23 1318   06/18/23 0930  For home use only DME Hospital bed  Once       Question Answer Comment  Length of Need Lifetime   Bed type Semi-electric      06/18/23 0929              Discharge Care Instructions  (From admission, onward)           Start     Ordered   06/20/23 0000  Discharge wound care:       Comments: 06/16/23 0500    Wound care  Daily      Comments: Clean coccyx and gluteal cleft with NS, apply Xeroform gauze [place] [phone]) to entire area daily and secure with sacral silicone foam with tip pointing upwards. May lift sacral foam to reapply Xeroform daily.  Change foam dressing q3 days and prn soiling  06/15/23 0910   06/20/23 1318            Follow-up Information     Dorothey Baseman, MD. Schedule an appointment as soon as possible for a visit in 1 week(s).   Specialty: Family Medicine Contact information: [address] [place] [address] [phone]         Jeralyn Ruths, MD. Go in 1 week(s).   Specialty: Oncology Why: f/u on your out pt appt Contact information: [address] [place] [address] [phone]         Iran Ouch, MD. Schedule an appointment as soon as possible for a visit in 1 week(s).   Specialty: Cardiology Why: f/u afib Contact information: [address] [place] [address] [phone]                 Discharge Exam: Ceasar Mons Weights   06/13/23 1636 06/15/23 0404 06/16/23 0320  Weight: 67.1 kg 68.3 kg 68.7 kg    GENERAL:  62 y.o.-year-old patient with no acute  distress. Appears chronically ill, debilitated LUNGS: decreased breath sounds more on right than left . Right side Pleurx catheter + CARDIOVASCULAR: S1, S2 normal.no murmur ABDOMEN: Soft, nontender, nondistended EXTREMITIES: trace edema b/l.    NEUROLOGIC: nonfocal  patient is alert and awake, weak, deconditioned SKIN:  Pressure Injury 06/14/23 Coccyx Right;Left;Medial Stage 2 -  Partial thickness loss of dermis presenting as a shallow open injury with a red, pink wound bed without slough. Red open area with skin slogging (Active)  06/14/23 1745  Location: Coccyx  Location Orientation: Right;Left;Medial  Staging: Stage 2 -  Partial thickness loss of dermis presenting as a shallow open injury with a red, pink wound bed without slough.  Wound Description (Comments): Red open area with skin slogging  Present on Admission: Yes    Condition at discharge: fair  The results of significant diagnostics from this hospitalization (including imaging, microbiology, ancillary and laboratory) are listed below for reference.   Imaging Studies: ECHOCARDIOGRAM COMPLETE  Result Date: 06/15/2023    ECHOCARDIOGRAM REPORT   Patient Name:   DEANGELO KISSICK Date of Exam: 06/15/2023 Medical Rec #:  [id number]     Height:       70.0 in Accession #:    [id number]    Weight:       150.6 lb Date of Birth:  1961/09/16     BSA:          1.850 m Patient Age:    62 years      BP:           101/68 mmHg Patient Gender: M             HR:           76 bpm. Exam Location:  [facility] Procedure: 2D Echo, Cardiac Doppler and Color Doppler Indications:     Atrial Fibrillation  History:         Patient has prior history of Echocardiogram examinations, most                  recent 01/29/2021. CAD, Arrythmias:Atrial Fibrillation; Risk                  Factors:Hypertension,  Diabetes, Dyslipidemia, Current Smoker                  and Sleep Apnea. Lung CA, Pleural effusion.  Sonographer:     Mikki Harbor Referring Phys:  [id number] Judithe Modest Diagnosing Phys: Debbe Odea MD  Sonographer Comments: Technically challenging study due to limited acoustic windows, no parasternal window, no apical window and suboptimal subcostal window. Image acquisition challenging due to patient body habitus. IMPRESSIONS  1. Consider repeat study, could not visualize any cardiac chamber.. Left ventricular ejection fraction, by estimation, is unable to assess%. The left ventricle has cannot be obtained function. Left ventricular endocardial border not optimally defined to  evaluate regional wall motion. The left ventricular internal cavity size was not visualized. Left ventricular diastolic function could not be evaluated.  2. Right ventricular systolic function grossly normal. The right ventricular size is not well visualized.  3. The mitral valve was not well visualized. No evidence of mitral valve regurgitation.  4. The aortic valve was not well visualized. Aortic valve regurgitation is not visualized. FINDINGS  Left Ventricle: Consider repeat study, could not visualize any cardiac chamber. Left ventricular ejection fraction, by estimation, is unable to assess%. The left ventricle has cannot be obtained function. Left ventricular endocardial border not optimally defined to evaluate  regional wall motion. The left ventricular internal cavity size was not visualized. Suboptimal image quality limits for assessment of left ventricular hypertrophy. Left ventricular diastolic function could not be evaluated. Right Ventricle: The right ventricular size is not well visualized. Right vetricular wall thickness was not assessed. Right ventricular systolic function grossly normal. Left Atrium: Left atrial size was not well visualized. Right Atrium: Right atrial size was not well visualized. Pericardium: The  pericardium was not well visualized. Mitral Valve: The mitral valve was not well visualized. No evidence of mitral valve regurgitation. Tricuspid Valve: The tricuspid valve is grossly normal. Tricuspid valve regurgitation is mild. Aortic Valve: The aortic valve was not well visualized. Aortic valve regurgitation is not visualized. Pulmonic Valve: The pulmonic valve was not well visualized. Pulmonic valve regurgitation is not visualized. Aorta: The aortic root was not well visualized. IAS/Shunts: The interatrial septum was not well visualized.  TRICUSPID VALVE TR Peak grad:   34.8 mmHg TR Vmax:        295.00 cm/s Debbe Odea MD Electronically signed by Debbe Odea MD Signature Date/Time: 06/15/2023/5:17:59 PM    Final    CT Angio Chest Pulmonary Embolism (PE) W or WO Contrast  Result Date: 06/13/2023 CLINICAL DATA:  Past medical history of stage IV adenocarcinoma of the lung with metastases to the bone admitted on 06/13/2023 with pneumonia. Pleural effusion, known or suspected. PE suspected EXAM: CT ANGIOGRAPHY CHEST WITH CONTRAST TECHNIQUE: Multidetector CT imaging of the chest was performed using the standard protocol during bolus administration of intravenous contrast. Multiplanar CT image reconstructions and MIPs were obtained to evaluate the vascular anatomy. RADIATION DOSE REDUCTION: This exam was performed according to the departmental dose-optimization program which includes automated exposure control, adjustment of the mA and/or kV according to patient size and/or use of iterative reconstruction technique. CONTRAST:  75mL OMNIPAQUE IOHEXOL 350 MG/ML SOLN COMPARISON:  Chest radiographs 06/13/2023 and PET/CT 04/19/2023 FINDINGS: Cardiovascular: No acute pulmonary embolism. There is abrupt tapering of the right middle lobe pulmonary artery similar to 03/31/2023. Normal heart size. No pericardial effusion. Coronary artery and aortic atherosclerotic calcification. Right chest wall Port-A-Cath.  Mediastinum/Nodes: Unremarkable esophagus. Right paratracheal lymph nodes measuring 12 (4/35) and 13 mm (4/44), respectively. Prevascular node on 4/39 measures 8 mm. Left supraclavicular lymphadenopathy measuring up 10 mm on 04/10. These are similar to 04/19/2023. Lungs/Pleura: Layering fluid in the esophagus extending into the right mainstem bronchus which is completely opacified. Complete atelectasis of the right lung. Large right pleural effusion. There is some ill-defined pleural nodularity similar to PET/CT 04/19/2023. Right chest tube. Moderate left pleural effusion associated atelectasis. Diffuse nodular interlobular septal thickening and bronchial wall thickening in the left lung compatible with lymphangitic carcinomatosis. Upper Abdomen: No acute abnormality. 2.2 cm mass in the left adrenal gland similar to prior and compatible with metastasis. Musculoskeletal: Multiple vertebral body osseous metastases. Left fifth rib osseous metastasis anteriorly. Review of the MIP images confirms the above findings. IMPRESSION: 1. No acute pulmonary embolism. Abrupt tapering of the right middle lobe pulmonary artery similar to 03/31/2023. 2. Complete atelectasis of the right lung. Large right pleural effusion with ill-defined pleural nodularity similar to PET/CT 04/19/2023. 3. Moderate left pleural effusion with associated atelectasis. 4. Diffuse nodular interlobular septal thickening and bronchial wall thickening in the left lung compatible with lymphangitic carcinomatosis. 5. Layering fluid in the lower trachea incompletely occluding the right mainstem bronchus. 6. Osseous, mediastinal lymph node, and left adrenal gland metastases Aortic Atherosclerosis (ICD10-I70.0). Electronically Signed   By: Angelique Holm.D.  On: 06/13/2023 22:39   DG Chest Port 1 View  Result Date: 06/13/2023 CLINICAL DATA:  Shortness of breath and increasing weakness EXAM: PORTABLE CHEST 1 VIEW COMPARISON:  Chest radiograph dated  05/18/2023 FINDINGS: Right chest wall port tip projects over the superior cavoatrial junction. Right pleural catheter tip projects over the right apex. Similar asymmetric opacification of the right lung with volume loss resulting in rightward mediastinal shift. Similar interstitial and nodular opacities of the left lung. Presumed large left pleural effusion. Heart borders are obscured. No acute osseous abnormality. IMPRESSION: 1. Similar asymmetric opacification of the right lung with volume loss resulting in rightward mediastinal shift. 2. Similar interstitial and nodular opacities of the left lung. 3. Presumed large left pleural effusion. Electronically Signed   By: Agustin Cree M.D.   On: 06/13/2023 17:30   US Venous Img Upper Uni Left  Result Date: 06/07/2023 CLINICAL DATA:  Left upper extremity edema. EXAM: LEFT UPPER EXTREMITY VENOUS DOPPLER ULTRASOUND TECHNIQUE: Gray-scale sonography with graded compression, as well as color Doppler and duplex ultrasound were performed to evaluate the upper extremity deep venous system from the level of the subclavian vein and including the jugular, axillary, basilic, radial, ulnar and upper cephalic vein. Spectral Doppler was utilized to evaluate flow at rest and with distal augmentation maneuvers. COMPARISON:  None Available. FINDINGS: Contralateral Subclavian Vein: Respiratory phasicity is normal and symmetric with the symptomatic side. No evidence of thrombus. Normal compressibility. Internal Jugular Vein: No evidence of thrombus. Normal compressibility, respiratory phasicity and response to augmentation. Subclavian Vein: No evidence of thrombus. Normal compressibility, respiratory phasicity and response to augmentation. Axillary Vein: No evidence of thrombus. Normal compressibility, respiratory phasicity and response to augmentation. Cephalic Vein: No evidence of thrombus. Normal compressibility, respiratory phasicity and response to augmentation. Basilic Vein: No  evidence of thrombus. Normal compressibility, respiratory phasicity and response to augmentation. Brachial Veins: No evidence of thrombus. Normal compressibility, respiratory phasicity and response to augmentation. Radial Veins: No evidence of thrombus. Normal compressibility, respiratory phasicity and response to augmentation. Ulnar Veins: No evidence of thrombus. Normal compressibility, respiratory phasicity and response to augmentation. Venous Reflux:  None visualized. Other Findings:  None visualized. IMPRESSION: No evidence of DVT within the LEFT upper extremity. Electronically Signed   By: Aram Candela M.D.   On: 06/07/2023 18:32    Microbiology: Results for orders placed or performed during the hospital encounter of 06/13/23  Culture, blood (Routine x 2)     Status: None   Collection Time: 06/13/23  4:40 PM   Specimen: BLOOD  Result Value Ref Range Status   Specimen Description BLOOD BLOOD RIGHT ARM  Final   Special Requests   Final    BOTTLES DRAWN AEROBIC AND ANAEROBIC Blood Culture adequate volume   Culture   Final    NO GROWTH 5 DAYS Performed at [facility], [address] [place], [place] [address]    Report Status 06/18/2023 FINAL  Final  Culture, blood (Routine x 2)     Status: None   Collection Time: 06/13/23  4:53 PM   Specimen: BLOOD  Result Value Ref Range Status   Specimen Description BLOOD BLOOD LEFT ARM  Final   Special Requests   Final    BOTTLES DRAWN AEROBIC AND ANAEROBIC Blood Culture results may not be optimal due to an excessive volume of blood received in culture bottles   Culture   Final    NO GROWTH 5 DAYS Performed at [facility], [address] [place],  [place] [address]    Report Status 06/18/2023 FINAL  Final  Resp panel by RT-PCR (RSV, Flu A&B, Covid) Anterior Nasal Swab     Status: None   Collection Time: 06/13/23  6:31 PM   Specimen: Anterior Nasal Swab  Result Value Ref Range Status   SARS Coronavirus 2 by RT PCR  NEGATIVE NEGATIVE Final    Comment: (NOTE) SARS-CoV-2 target nucleic acids are NOT DETECTED.  The SARS-CoV-2 RNA is generally detectable in upper respiratory specimens during the acute phase of infection. The lowest concentration of SARS-CoV-2 viral copies this assay can detect is 138 copies/mL. A negative result does not preclude SARS-Cov-2 infection and should not be used as the sole basis for treatment or other patient management decisions. A negative result may occur with  improper specimen collection/handling, submission of specimen other than nasopharyngeal swab, presence of viral mutation(s) within the areas targeted by this assay, and inadequate number of viral copies(<138 copies/mL). A negative result must be combined with clinical observations, patient history, and epidemiological information. The expected result is Negative.  Fact Sheet for Patients:  BloggerCourse.com  Fact Sheet for Healthcare Providers:  SeriousBroker.it  This test is no t yet approved or cleared by the [place] FDA and  has been authorized for detection and/or diagnosis of SARS-CoV-2 by FDA under an Emergency Use Authorization (EUA). This EUA will remain  in effect (meaning this test can be used) for the duration of the COVID-19 declaration under Section 564(b)(1) of the Act, 21 U.S.C.section 360bbb-3(b)(1), unless the authorization is terminated  or revoked sooner.       Influenza A by PCR NEGATIVE NEGATIVE Final   Influenza B by PCR NEGATIVE NEGATIVE Final    Comment: (NOTE) The Xpert Xpress SARS-CoV-2/FLU/RSV plus assay is intended as an aid in the diagnosis of influenza from Nasopharyngeal swab specimens and should not be used as a sole basis for treatment. Nasal washings and aspirates are unacceptable for Xpert Xpress SARS-CoV-2/FLU/RSV testing.  Fact Sheet for Patients: BloggerCourse.com  Fact Sheet for  Healthcare Providers: SeriousBroker.it  This test is not yet approved or cleared by the [place] FDA and has been authorized for detection and/or diagnosis of SARS-CoV-2 by FDA under an Emergency Use Authorization (EUA). This EUA will remain in effect (meaning this test can be used) for the duration of the COVID-19 declaration under Section 564(b)(1) of the Act, 21 U.S.C. section 360bbb-3(b)(1), unless the authorization is terminated or revoked.     Resp Syncytial Virus by PCR NEGATIVE NEGATIVE Final    Comment: (NOTE) Fact Sheet for Patients: BloggerCourse.com  Fact Sheet for Healthcare Providers: SeriousBroker.it  This test is not yet approved or cleared by the [place] FDA and has been authorized for detection and/or diagnosis of SARS-CoV-2 by FDA under an Emergency Use Authorization (EUA). This EUA will remain in effect (meaning this test can be used) for the duration of the COVID-19 declaration under Section 564(b)(1) of the Act, 21 U.S.C. section 360bbb-3(b)(1), unless the authorization is terminated or revoked.  Performed at [facility], [address] [place], [place] [address]   MRSA Next Gen by PCR, Nasal     Status: None   Collection Time: 06/14/23  5:47 AM   Specimen: Nasal Mucosa; Nasal Swab  Result Value Ref Range Status   MRSA by PCR Next Gen NOT DETECTED NOT DETECTED Final    Comment: (NOTE) The GeneXpert MRSA Assay (FDA approved for NASAL specimens only), is one component of a comprehensive MRSA colonization  surveillance program. It is not intended to diagnose MRSA infection nor to guide or monitor treatment for MRSA infections. Test performance is not FDA approved in patients less than 49 years old. Performed at [facility], [address] [place], [place] [address]   Expectorated Sputum Assessment w Gram Stain, Rflx to Resp Cult     Status: None    Collection Time: 06/15/23  2:24 PM   Specimen: Sputum  Result Value Ref Range Status   Specimen Description SPUTUM  Final   Special Requests NONE  Final   Sputum evaluation   Final    Sputum specimen not acceptable for testing.  Please recollect.   C/ IMMACULATE [id number] 06/16/23 1458 MU Performed at [facility], [address] [place], [place] [address]    Report Status 06/16/2023 FINAL  Final  Respiratory (~20 pathogens) panel by PCR     Status: None   Collection Time: 06/16/23  4:32 AM   Specimen: Nasopharyngeal Swab; Respiratory  Result Value Ref Range Status   Adenovirus NOT DETECTED NOT DETECTED Final   Coronavirus 229E NOT DETECTED NOT DETECTED Final    Comment: (NOTE) The Coronavirus on the Respiratory Panel, DOES NOT test for the novel  Coronavirus (2019 nCoV)    Coronavirus HKU1 NOT DETECTED NOT DETECTED Final   Coronavirus NL63 NOT DETECTED NOT DETECTED Final   Coronavirus OC43 NOT DETECTED NOT DETECTED Final   Metapneumovirus NOT DETECTED NOT DETECTED Final   Rhinovirus / Enterovirus NOT DETECTED NOT DETECTED Final   Influenza A NOT DETECTED NOT DETECTED Final   Influenza B NOT DETECTED NOT DETECTED Final   Parainfluenza Virus 1 NOT DETECTED NOT DETECTED Final   Parainfluenza Virus 2 NOT DETECTED NOT DETECTED Final   Parainfluenza Virus 3 NOT DETECTED NOT DETECTED Final   Parainfluenza Virus 4 NOT DETECTED NOT DETECTED Final   Respiratory Syncytial Virus NOT DETECTED NOT DETECTED Final   Bordetella pertussis NOT DETECTED NOT DETECTED Final   Bordetella Parapertussis NOT DETECTED NOT DETECTED Final   Chlamydophila pneumoniae NOT DETECTED NOT DETECTED Final   Mycoplasma pneumoniae NOT DETECTED NOT DETECTED Final    Comment: Performed at [facility], [address] [place], [place] [address]    Labs: CBC: Recent Labs  Lab 06/15/23 [phone] 06/16/23 0341 06/17/23 0628 06/18/23 0625 06/19/23 0700  WBC 10.9* 6.6 13.8* 16.3* 17.0*  HGB 10.8* 9.4*  10.7* 12.7* 12.4*  HCT 32.9* 28.9* 32.1* 39.9 37.9*  MCV 86.8 86.3 87.0 89.1 87.1  PLT 95* 67* 106* 100* 122*   Basic Metabolic Panel: Recent Labs  Lab 06/15/23 0412 06/15/23 1530 06/16/23 0341 06/17/23 0628 06/18/23 0625 06/19/23 0700  NA 132*  --  133* 134* 136 132*  K 3.1* 3.9 3.2* 3.1* 5.0 3.4*  CL 100  --  99 98 97* 95*  CO2 22  --  26 28 31 31   GLUCOSE 146*  --  126* 138* 121* 91  BUN 14  --  9 8 7* 9  CREATININE 0.38*  --  0.31* 0.41* 0.37* <0.30*  CALCIUM 7.5*  --  8.0* 8.3* 8.5* 7.9*  MG 1.7  --  2.0  --   --   --   PHOS 1.3* 3.7 1.7* 1.4* 1.8* 2.5   Liver Function Tests: Recent Labs  Lab 06/14/23 0547 06/15/23 0412 06/16/23 0341 06/17/23 0628 06/18/23 0625 06/19/23 0700  AST 17 18  --   --   --   --   ALT 16 16  --   --   --   --  ALKPHOS 103 118  --   --   --   --   BILITOT 1.6* 0.9  --   --   --   --   PROT 4.9* 5.0*  --   --   --   --   ALBUMIN 2.1* 2.0*  2.0* 2.6* 2.5* 2.4* 2.2*   CBG: Recent Labs  Lab 06/20/23 0038 06/20/23 0506 06/20/23 0600 06/20/23 0906 06/20/23 1214  GLUCAP 73 90 95 109* 163*    Discharge time spent: greater than 30 minutes.  Signed: Enedina Finner, MD [facility] 06/20/2023

## 2023-06-20 NOTE — TOC Progression Note (Addendum)
Transition of Care [facility]) - Progression Note    Patient Details  Name: Charles Hickman MRN: [id number] Date of Birth: 31-May-1961  Transition of Care [facility]) CM/SW Contact  Margarito Liner, LCSW Phone Number: 06/20/2023, 11:34 AM  Clinical Narrative:  Wife said bed will be delivered in the next 30 minutes. Patient will need EMS transport home. Address on facesheet is correct. CSW will print off contact information for [facility] and [facility] to assist with personal care services if needed. CSW notified wife that these agencies are not affiliated with [facility].   1:51 pm: [employer] brought a semielectric bed to the home today. Wife said she was told insurance would cover a fully electric bed. [employer] reviewed and said patient has met his out-of-pocket so insurance will cover 100%. New orders entered to include fully electric and gel overlay.   Expected Discharge Plan: Home w Home Health Services Barriers to Discharge: Continued Medical Work up  Expected Discharge Plan and Services In-house Referral: Clinical Social Work, Hospice / Palliative Care   Post Acute Care Choice: Home Health Living arrangements for the past 2 months: Single Family Home                 DME Arranged: Hospital bed         HH Arranged: PT HH Agency: [facility] Date [facility] Agency Contacted: 06/17/23 Time HH Agency Contacted: 1600 Representative spoke with at [facility] Agency: Kandee Keen   Social Determinants of Health (SDOH) Interventions SDOH Screenings   Food Insecurity: No Food Insecurity (06/14/2023)  Housing: Low Risk  (06/14/2023)  Transportation Needs: No Transportation Needs (06/14/2023)  Utilities: Not At Risk (06/14/2023)  Depression (PHQ2-9): Low Risk  (12/29/2020)  Financial Resource Strain: Low Risk  (04/06/2023)  Tobacco Use: Medium Risk (06/14/2023)    Readmission Risk Interventions     No data to display

## 2023-06-20 NOTE — Progress Notes (Signed)
Discussed with hospital RN. Oxycodone liberalized to 5-10mg  Q4H PRN for discharge home.

## 2023-06-20 NOTE — Progress Notes (Signed)
Hypoglycemic Event  CBG: 59  Treatment: 8 oz juice/soda  Symptoms: None  Follow-up CBG: Time:0038 CBG Result:73  Possible Reasons for Event: Unknown  Comments/MD notified: Patient given 8 oz of juice and 1 cup of ice cream.     Lamonte Richer

## 2023-06-20 NOTE — Progress Notes (Signed)
Pt's sacral drsg changed.

## 2023-06-20 NOTE — Progress Notes (Signed)
Occupational Therapy Treatment Patient Details Name: Charles Hickman MRN: [id number] DOB: 12-31-1960 Today's Date: 06/20/2023   History of present illness 62 y.o. 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 Patient also has malignant pleural effusion and is status post Pleurx catheter.  Patient is status post recent pathologic fracture of the left hip after transfer from from wheelchair to the car.  He underwent arthroplasty  at [facility] on 05/21/2023.  He is now admitted 06/13/2023 to the hospital with acute on chronic respiratory failure.  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   OT comments  Pt received supine in bed, agreeable to OT tx. VSS throughout tx, on 3L Sp02% via [place]. HR 86-87, Sp02% 95-97%. Pt performing LE exercises and OT discussed AROM for UE to maintain abilities to perform UB ADLs. See below for additional details. Attempted to sit pt EOB, but pt could not tolerate. Movements effortful, with multiple rest breaks required. +2 to scoot pt up towards HOB; pt coughing up phlegm when supine (RN aware). Assisted pt with cutting up pancakes and setting up breakfast tray. Pt left eating breakfast, call bell/phone within reach, bed alarm on. Continues to benefit from skilled OT to address functional limitations, decrease caregiver burden, and improve tolerance to activity.    Recommendations for follow up therapy are one component of a multi-disciplinary discharge planning process, led by the attending physician.  Recommendations may be updated based on patient status, additional functional criteria and insurance authorization.    Assistance Recommended at Discharge Intermittent Supervision/Assistance  Patient can return home with the following   Assistance with cooking/housework;Assist for transportation;Help with stairs or ramp for entrance;A lot of help with walking and/or transfers;A lot of help with bathing/dressing/bathroom   Equipment Recommendations  Hospital bed       Precautions / Restrictions Precautions Precautions: Fall Precaution Comments: Posterior hip precautions LLE Restrictions Weight Bearing Restrictions: No       Mobility Bed Mobility Overal bed mobility: Needs Assistance Bed Mobility: Supine to Sit Rolling: Mod assist   Supine to sit: Mod assist Sit to supine: Mod assist   General bed mobility comments: Use of bed rails; extensive time and cuing provided, VSS, but pt requiring frequent rest breaks d/t breathing    Transfers                             ADL either performed or assessed with clinical judgement   ADL Overall ADL's : Needs assistance/impaired Eating/Feeding: Minimal assistance (Requires assist to cut food d/t fatigue)   Grooming: Wash/dry face;Wash/dry hands;Set up;Bed level       Lower Body Bathing: Set up;Bed level Lower Body Bathing Details (indicate cue type and reason): Pt performing proximal bathing with washcloth per request                       General ADL Comments: Pt generally fatigued and deconditioned      Cognition Arousal/Alertness: Awake/alert Behavior During Therapy: WFL for tasks assessed/performed Overall Cognitive Status: Within Functional Limits for tasks assessed  Exercises General Exercises - Lower Extremity Ankle Circles/Pumps: AROM, Both, 15 reps, Supine Quad Sets: Right, 10 reps (To hooklying with minA from OT,)            Pertinent Vitals/ Pain       Pain Assessment Pain Assessment: 0-10 Pain Score: 5    Frequency  Min 2X/week        Progress Toward Goals  OT Goals(current goals can now be found in the care plan section)  Progress towards OT  goals: Progressing toward goals  Acute Rehab OT Goals Time For Goal Achievement: 06/28/23 Potential to Achieve Goals: Fair  Plan Discharge plan remains appropriate;Frequency remains appropriate       AM-PAC OT "6 Clicks" Daily Activity     Outcome Measure   Help from another person eating meals?: A Little Help from another person taking care of personal grooming?: A Little Help from another person toileting, which includes using toliet, bedpan, or urinal?: A Lot Help from another person bathing (including washing, rinsing, drying)?: A Lot Help from another person to put on and taking off regular upper body clothing?: A Little Help from another person to put on and taking off regular lower body clothing?: A Lot 6 Click Score: 15    End of Session Equipment Utilized During Treatment: Oxygen (3L SpO2%)  OT Visit Diagnosis: Unsteadiness on feet (R26.81);Repeated falls (R29.6);Muscle weakness (generalized) (M62.81)   Activity Tolerance Patient limited by pain;Patient limited by fatigue   Patient Left in bed;with call bell/phone within reach;with bed alarm set           Time: 0903-1006 OT Time Calculation (min): 63 min  Charges: OT General Charges $OT Visit: 1 Visit OT Treatments $Self Care/Home Management : 53-67 mins Dorthey Depace L. Yareth Kearse, OTR/L  06/20/23, 10:21 AM

## 2023-06-20 NOTE — TOC CM/SW Note (Addendum)
    Durable Medical Equipment  (From admission, onward)           Start     Ordered   06/20/23 1418  For home use only DME Hospital bed  Once       Comments: Fully Electric bed  Question Answer Comment  Length of Need Lifetime   Bed type Semi-electric   Support Surface: Gel Overlay      06/20/23 1417   06/20/23 0000  For home use only DME Nebulizer machine       Question Answer Comment  Patient needs a nebulizer to treat with the following condition COPD (chronic obstructive pulmonary disease) (HCC)   Length of Need Lifetime      06/20/23 1318   06/18/23 0930  For home use only DME Hospital bed  Once       Question Answer Comment  Length of Need Lifetime   Bed type Semi-electric      06/18/23 0929

## 2023-06-20 NOTE — Progress Notes (Signed)
Patient being discharged home today.  I called and spoke with his wife by phone.  We discussed his poor performance status and how that might prove to be a barrier for future cancer treatment.  At this time their goals are still aligned with treatment as possible.  Patient scheduled for follow-up in the cancer center next week for evaluation.  Will refer to community palliative care as wife is requesting additional resources at home.  Would benefit from social work consultation as well.

## 2023-06-20 NOTE — Discharge Instructions (Signed)
Use your oxygen, nebulizer as before continue your daily pleurex catheter drainage as before

## 2023-06-21 ENCOUNTER — Inpatient Hospital Stay: Payer: Managed Care, Other (non HMO) | Attending: Oncology | Admitting: Hospice and Palliative Medicine

## 2023-06-21 ENCOUNTER — Ambulatory Visit: Payer: Managed Care, Other (non HMO)

## 2023-06-21 DIAGNOSIS — C3491 Malignant neoplasm of unspecified part of right bronchus or lung: Secondary | ICD-10-CM

## 2023-06-21 LAB — GLUCOSE, CAPILLARY
Glucose-Capillary: 124 mg/dL — ABNORMAL HIGH (ref 70–99)
Glucose-Capillary: 66 mg/dL — ABNORMAL LOW (ref 70–99)
Glucose-Capillary: 90 mg/dL (ref 70–99)
Glucose-Capillary: 93 mg/dL (ref 70–99)

## 2023-06-21 MED ORDER — HEPARIN SOD (PORK) LOCK FLUSH 100 UNIT/ML IV SOLN
500.0000 [IU] | Freq: Once | INTRAVENOUS | Status: AC
  Administered 2023-06-21: 500 [IU] via INTRAVENOUS
  Filled 2023-06-21: qty 5

## 2023-06-21 NOTE — Plan of Care (Signed)
  Problem: Education: Goal: Knowledge of General Education information will improve Description: Including pain rating scale, medication(s)/side effects and non-pharmacologic comfort measures Outcome: Progressing   Problem: Health Behavior/Discharge Planning: Goal: Ability to manage health-related needs will improve Outcome: Progressing   Problem: Clinical Measurements: Goal: Ability to maintain clinical measurements within normal limits will improve Outcome: Progressing Goal: Will remain free from infection Outcome: Progressing Goal: Diagnostic test results will improve Outcome: Progressing Goal: Respiratory complications will improve Outcome: Progressing Goal: Cardiovascular complication will be avoided Outcome: Progressing   Problem: Activity: Goal: Risk for activity intolerance will decrease Outcome: Progressing   Problem: Nutrition: Goal: Adequate nutrition will be maintained Outcome: Progressing   Problem: Coping: Goal: Level of anxiety will decrease Outcome: Progressing   Problem: Elimination: Goal: Will not experience complications related to bowel motility Outcome: Progressing Goal: Will not experience complications related to urinary retention Outcome: Progressing   Problem: Pain Managment: Goal: General experience of comfort will improve Outcome: Progressing   Problem: Safety: Goal: Ability to remain free from injury will improve Outcome: Progressing   Problem: Skin Integrity: Goal: Risk for impaired skin integrity will decrease Outcome: Progressing   Problem: Education: Goal: Ability to describe self-care measures that may prevent or decrease complications (Diabetes Survival Skills Education) will improve Outcome: Progressing   Problem: Coping: Goal: Ability to adjust to condition or change in health will improve Outcome: Progressing   Problem: Fluid Volume: Goal: Ability to maintain a balanced intake and output will improve Outcome:  Progressing   Problem: Health Behavior/Discharge Planning: Goal: Ability to identify and utilize available resources and services will improve Outcome: Progressing Goal: Ability to manage health-related needs will improve Outcome: Progressing   Problem: Metabolic: Goal: Ability to maintain appropriate glucose levels will improve Outcome: Progressing   Problem: Nutritional: Goal: Maintenance of adequate nutrition will improve Outcome: Progressing Goal: Progress toward achieving an optimal weight will improve Outcome: Progressing   Problem: Skin Integrity: Goal: Risk for impaired skin integrity will decrease Outcome: Progressing   Problem: Tissue Perfusion: Goal: Adequacy of tissue perfusion will improve Outcome: Progressing   

## 2023-06-21 NOTE — Progress Notes (Signed)
Patient A&O x 4, pt patient blood sugar 66 during midnight check, treated according to hypoglycemic protocol. Provided patient with 4 oz of apple juice per patient request. Rechecked blood sugar is 90. Will continue to monitor

## 2023-06-21 NOTE — TOC Transition Note (Signed)
Transition of Care [facility]) - CM/SW Discharge Note   Patient Details  Name: Charles Hickman MRN: [id number] Date of Birth: [birth year]  Transition of Care [facility]) CM/SW Contact:  Margarito Liner, LCSW Phone Number: 06/21/2023, 9:14 AM   Clinical Narrative:   Patient has orders to discharge home today. CSW called EMS and confirmed he is still on the list for transport. CSW called wife to notify. No further concerns. CSW signing off.  Final next level of care: Home w Home Health Services Barriers to Discharge: Barriers Resolved   Patient Goals and CMS Choice CMS Medicare.gov Compare Post Acute Care list provided to:: Patient Represenative (must comment) (Patient's wife) Choice offered to / list presented to : Spouse  Discharge Placement                  Patient to be transferred to facility by: EMS Name of family member notified: Jerry Caras Patient and family notified of of transfer: 06/21/23  Discharge Plan and Services Additional resources added to the After Visit Summary for   In-house Referral: Clinical Social Work, Hospice / Palliative Care   Post Acute Care Choice: Home Health          DME Arranged: Nebulizer machine, Hospital bed DME Agency: [employer] Date DME Agency Contacted: 06/20/23   Representative spoke with at DME Agency: Cecilio Asper HH Arranged: PT HH Agency: [facility] Date [facility] Agency Contacted: 06/20/23 Time HH Agency Contacted: 1600 Representative spoke with at [facility] Agency: Cindie Silmon  Social Determinants of Health (SDOH) Interventions SDOH Screenings   Food Insecurity: No Food Insecurity (06/14/2023)  Housing: Low Risk  (06/14/2023)  Transportation Needs: No Transportation Needs (06/14/2023)  Utilities: Not At Risk (06/14/2023)  Depression (PHQ2-9): Low Risk  (12/29/2020)  Financial Resource Strain: Low Risk  (04/06/2023)  Tobacco Use: Medium Risk (06/14/2023)     Readmission Risk Interventions     No data to display

## 2023-06-21 NOTE — Progress Notes (Signed)
I spoke with patient by phone.  He is discharging home from the hospital today.  He remains frail and weak with poor performance status.  EMS is transporting him home.  We discussed how his performance status might limit future treatment options.  He is pending home health PT and wants to see if he gets stronger to pursue more treatment.  Patient has been receiving IV morphine in the hospital for pain and dyspnea, which he has found effective.  He asked about rotating from oxycodone to MS IR.  However, I recommended that he first try liberalizing dosing of oxycodone, which she has on hand at home.  If ineffective, I would consider rotating to morphine.  Discussed that any oral medication will have delayed onset when compared to IV meds.  Case and plan discussed with Dr. Orlie Dakin.  Patient scheduled for clinic visit next week.

## 2023-06-21 NOTE — Progress Notes (Signed)
Pt RN--staff was waiting for wife to notify of DME at home. Did not get the phone call from her and by the time they found that DME was delivered it was late for EMS to transport. Pt will discharge today around 10 am Pt is agreeable and overall at baseline  NO charge

## 2023-06-22 ENCOUNTER — Ambulatory Visit: Payer: Managed Care, Other (non HMO)

## 2023-06-23 ENCOUNTER — Emergency Department: Payer: Managed Care, Other (non HMO)

## 2023-06-23 ENCOUNTER — Other Ambulatory Visit: Payer: Self-pay

## 2023-06-23 ENCOUNTER — Encounter: Payer: Self-pay | Admitting: Emergency Medicine

## 2023-06-23 ENCOUNTER — Inpatient Hospital Stay
Admission: EM | Admit: 2023-06-23 | Discharge: 2023-06-25 | DRG: 308 | Disposition: A | Discharge status: Hospice | Payer: Managed Care, Other (non HMO) | Attending: Student | Admitting: Student

## 2023-06-23 ENCOUNTER — Inpatient Hospital Stay: Payer: Managed Care, Other (non HMO)

## 2023-06-23 DIAGNOSIS — L89152 Pressure ulcer of sacral region, stage 2: Secondary | ICD-10-CM | POA: Diagnosis present

## 2023-06-23 DIAGNOSIS — I4891 Unspecified atrial fibrillation: Secondary | ICD-10-CM | POA: Diagnosis not present

## 2023-06-23 DIAGNOSIS — B1920 Unspecified viral hepatitis C without hepatic coma: Secondary | ICD-10-CM | POA: Diagnosis present

## 2023-06-23 DIAGNOSIS — Z9861 Coronary angioplasty status: Secondary | ICD-10-CM

## 2023-06-23 DIAGNOSIS — Z7984 Long term (current) use of oral hypoglycemic drugs: Secondary | ICD-10-CM

## 2023-06-23 DIAGNOSIS — I2489 Other forms of acute ischemic heart disease: Secondary | ICD-10-CM | POA: Diagnosis present

## 2023-06-23 DIAGNOSIS — Z7901 Long term (current) use of anticoagulants: Secondary | ICD-10-CM

## 2023-06-23 DIAGNOSIS — E876 Hypokalemia: Secondary | ICD-10-CM | POA: Diagnosis present

## 2023-06-23 DIAGNOSIS — J9811 Atelectasis: Secondary | ICD-10-CM | POA: Diagnosis present

## 2023-06-23 DIAGNOSIS — Z515 Encounter for palliative care: Secondary | ICD-10-CM

## 2023-06-23 DIAGNOSIS — A419 Sepsis, unspecified organism: Secondary | ICD-10-CM

## 2023-06-23 DIAGNOSIS — R6510 Systemic inflammatory response syndrome (SIRS) of non-infectious origin without acute organ dysfunction: Secondary | ICD-10-CM | POA: Diagnosis present

## 2023-06-23 DIAGNOSIS — R570 Cardiogenic shock: Secondary | ICD-10-CM | POA: Diagnosis present

## 2023-06-23 DIAGNOSIS — E1142 Type 2 diabetes mellitus with diabetic polyneuropathy: Secondary | ICD-10-CM | POA: Diagnosis present

## 2023-06-23 DIAGNOSIS — I700 Atherosclerosis of aorta: Secondary | ICD-10-CM | POA: Diagnosis present

## 2023-06-23 DIAGNOSIS — J9621 Acute and chronic respiratory failure with hypoxia: Secondary | ICD-10-CM | POA: Diagnosis present

## 2023-06-23 DIAGNOSIS — E43 Unspecified severe protein-calorie malnutrition: Secondary | ICD-10-CM | POA: Diagnosis present

## 2023-06-23 DIAGNOSIS — Z7189 Other specified counseling: Secondary | ICD-10-CM | POA: Diagnosis not present

## 2023-06-23 DIAGNOSIS — Z9981 Dependence on supplemental oxygen: Secondary | ICD-10-CM

## 2023-06-23 DIAGNOSIS — R54 Age-related physical debility: Secondary | ICD-10-CM | POA: Diagnosis present

## 2023-06-23 DIAGNOSIS — I5042 Chronic combined systolic (congestive) and diastolic (congestive) heart failure: Secondary | ICD-10-CM | POA: Diagnosis present

## 2023-06-23 DIAGNOSIS — D6481 Anemia due to antineoplastic chemotherapy: Secondary | ICD-10-CM | POA: Diagnosis present

## 2023-06-23 DIAGNOSIS — Z833 Family history of diabetes mellitus: Secondary | ICD-10-CM

## 2023-06-23 DIAGNOSIS — K5900 Constipation, unspecified: Secondary | ICD-10-CM | POA: Diagnosis present

## 2023-06-23 DIAGNOSIS — J189 Pneumonia, unspecified organism: Secondary | ICD-10-CM

## 2023-06-23 DIAGNOSIS — E871 Hypo-osmolality and hyponatremia: Secondary | ICD-10-CM | POA: Diagnosis present

## 2023-06-23 DIAGNOSIS — E785 Hyperlipidemia, unspecified: Secondary | ICD-10-CM | POA: Diagnosis present

## 2023-06-23 DIAGNOSIS — R0902 Hypoxemia: Secondary | ICD-10-CM

## 2023-06-23 DIAGNOSIS — C3491 Malignant neoplasm of unspecified part of right bronchus or lung: Secondary | ICD-10-CM | POA: Diagnosis present

## 2023-06-23 DIAGNOSIS — Z1152 Encounter for screening for COVID-19: Secondary | ICD-10-CM

## 2023-06-23 DIAGNOSIS — Z87891 Personal history of nicotine dependence: Secondary | ICD-10-CM

## 2023-06-23 DIAGNOSIS — Z66 Do not resuscitate: Secondary | ICD-10-CM | POA: Diagnosis not present

## 2023-06-23 DIAGNOSIS — R0602 Shortness of breath: Secondary | ICD-10-CM | POA: Diagnosis not present

## 2023-06-23 DIAGNOSIS — Z841 Family history of disorders of kidney and ureter: Secondary | ICD-10-CM

## 2023-06-23 DIAGNOSIS — I110 Hypertensive heart disease with heart failure: Secondary | ICD-10-CM | POA: Diagnosis present

## 2023-06-23 DIAGNOSIS — Z823 Family history of stroke: Secondary | ICD-10-CM

## 2023-06-23 DIAGNOSIS — R079 Chest pain, unspecified: Secondary | ICD-10-CM | POA: Diagnosis present

## 2023-06-23 DIAGNOSIS — I2510 Atherosclerotic heart disease of native coronary artery without angina pectoris: Secondary | ICD-10-CM | POA: Diagnosis present

## 2023-06-23 DIAGNOSIS — D6959 Other secondary thrombocytopenia: Secondary | ICD-10-CM | POA: Diagnosis present

## 2023-06-23 DIAGNOSIS — Z83438 Family history of other disorder of lipoprotein metabolism and other lipidemia: Secondary | ICD-10-CM

## 2023-06-23 DIAGNOSIS — Z79899 Other long term (current) drug therapy: Secondary | ICD-10-CM

## 2023-06-23 DIAGNOSIS — J449 Chronic obstructive pulmonary disease, unspecified: Secondary | ICD-10-CM | POA: Diagnosis present

## 2023-06-23 DIAGNOSIS — T451X5A Adverse effect of antineoplastic and immunosuppressive drugs, initial encounter: Secondary | ICD-10-CM | POA: Diagnosis present

## 2023-06-23 DIAGNOSIS — C7951 Secondary malignant neoplasm of bone: Secondary | ICD-10-CM | POA: Diagnosis present

## 2023-06-23 DIAGNOSIS — Z8572 Personal history of non-Hodgkin lymphomas: Secondary | ICD-10-CM

## 2023-06-23 DIAGNOSIS — R748 Abnormal levels of other serum enzymes: Secondary | ICD-10-CM | POA: Diagnosis present

## 2023-06-23 DIAGNOSIS — I252 Old myocardial infarction: Secondary | ICD-10-CM

## 2023-06-23 DIAGNOSIS — Z6821 Body mass index (BMI) 21.0-21.9, adult: Secondary | ICD-10-CM

## 2023-06-23 DIAGNOSIS — Z8673 Personal history of transient ischemic attack (TIA), and cerebral infarction without residual deficits: Secondary | ICD-10-CM

## 2023-06-23 DIAGNOSIS — C3490 Malignant neoplasm of unspecified part of unspecified bronchus or lung: Secondary | ICD-10-CM

## 2023-06-23 DIAGNOSIS — Z9049 Acquired absence of other specified parts of digestive tract: Secondary | ICD-10-CM

## 2023-06-23 DIAGNOSIS — Z8249 Family history of ischemic heart disease and other diseases of the circulatory system: Secondary | ICD-10-CM

## 2023-06-23 LAB — CBC WITH DIFFERENTIAL/PLATELET
Abs Immature Granulocytes: 1.31 10*3/uL — ABNORMAL HIGH (ref 0.00–0.07)
Basophils Absolute: 0.1 10*3/uL (ref 0.0–0.1)
Basophils Relative: 1 %
Eosinophils Absolute: 0 10*3/uL (ref 0.0–0.5)
Eosinophils Relative: 0 %
HCT: 32.8 % — ABNORMAL LOW (ref 39.0–52.0)
Hemoglobin: 10.2 g/dL — ABNORMAL LOW (ref 13.0–17.0)
Immature Granulocytes: 6 %
Lymphocytes Relative: 1 %
Lymphs Abs: 0.2 10*3/uL — ABNORMAL LOW (ref 0.7–4.0)
MCH: 28.4 pg (ref 26.0–34.0)
MCHC: 31.1 g/dL (ref 30.0–36.0)
MCV: 91.4 fL (ref 80.0–100.0)
Monocytes Absolute: 0.7 10*3/uL (ref 0.1–1.0)
Monocytes Relative: 3 %
Neutro Abs: 21.2 10*3/uL — ABNORMAL HIGH (ref 1.7–7.7)
Neutrophils Relative %: 89 %
Platelets: 142 10*3/uL — ABNORMAL LOW (ref 150–400)
RBC: 3.59 MIL/uL — ABNORMAL LOW (ref 4.22–5.81)
RDW: 21.1 % — ABNORMAL HIGH (ref 11.5–15.5)
Smear Review: NORMAL
WBC: 23.6 10*3/uL — ABNORMAL HIGH (ref 4.0–10.5)
nRBC: 0.1 % (ref 0.0–0.2)

## 2023-06-23 LAB — COMPREHENSIVE METABOLIC PANEL
ALT: 14 U/L (ref 0–44)
AST: 27 U/L (ref 15–41)
Albumin: 2.1 g/dL — ABNORMAL LOW (ref 3.5–5.0)
Alkaline Phosphatase: 199 U/L — ABNORMAL HIGH (ref 38–126)
Anion gap: 14 (ref 5–15)
BUN: 10 mg/dL (ref 8–23)
CO2: 26 mmol/L (ref 22–32)
Calcium: 7.6 mg/dL — ABNORMAL LOW (ref 8.9–10.3)
Chloride: 94 mmol/L — ABNORMAL LOW (ref 98–111)
Creatinine, Ser: 0.46 mg/dL — ABNORMAL LOW (ref 0.61–1.24)
GFR, Estimated: >60 mL/min (ref >60)
Glucose, Bld: 164 mg/dL — ABNORMAL HIGH (ref 70–99)
Potassium: 3.5 mmol/L (ref 3.5–5.1)
Sodium: 134 mmol/L — ABNORMAL LOW (ref 135–145)
Total Bilirubin: 0.9 mg/dL (ref 0.3–1.2)
Total Protein: 5.2 g/dL — ABNORMAL LOW (ref 6.5–8.1)

## 2023-06-23 LAB — LACTIC ACID, PLASMA
Lactic Acid, Venous: 3.4 mmol/L (ref 0.5–1.9)
Lactic Acid, Venous: 2.7 mmol/L (ref 0.5–1.9)
Lactic Acid, Venous: 1.6 mmol/L (ref 0.5–1.9)
Lactic Acid, Venous: 1.3 mmol/L (ref 0.5–1.9)

## 2023-06-23 LAB — RESP PANEL BY RT-PCR (RSV, FLU A&B, COVID)  RVPGX2
Influenza A by PCR: NEGATIVE
Influenza B by PCR: NEGATIVE
Resp Syncytial Virus by PCR: NEGATIVE
SARS Coronavirus 2 by RT PCR: NEGATIVE

## 2023-06-23 LAB — CULTURE, BLOOD (ROUTINE X 2)

## 2023-06-23 LAB — CBC
HCT: 38.7 % — ABNORMAL LOW (ref 39.0–52.0)
Hemoglobin: 12.1 g/dL — ABNORMAL LOW (ref 13.0–17.0)
MCH: 28.3 pg (ref 26.0–34.0)
MCHC: 31.3 g/dL (ref 30.0–36.0)
MCV: 90.6 fL (ref 80.0–100.0)
Platelets: 138 10*3/uL — ABNORMAL LOW (ref 150–400)
RBC: 4.27 MIL/uL (ref 4.22–5.81)
RDW: 20.9 % — ABNORMAL HIGH (ref 11.5–15.5)
WBC: 20.6 10*3/uL — ABNORMAL HIGH (ref 4.0–10.5)
nRBC: 0.1 % (ref 0.0–0.2)

## 2023-06-23 LAB — MRSA NEXT GEN BY PCR, NASAL: MRSA by PCR Next Gen: NOT DETECTED

## 2023-06-23 LAB — TROPONIN I (HIGH SENSITIVITY)
Troponin I (High Sensitivity): 27 ng/L — ABNORMAL HIGH (ref <18)
Troponin I (High Sensitivity): 27 ng/L — ABNORMAL HIGH (ref <18)

## 2023-06-23 LAB — URINALYSIS, COMPLETE (UACMP) WITH MICROSCOPIC
Bilirubin Urine: NEGATIVE
Glucose, UA: >=500 mg/dL — AB
Hgb urine dipstick: NEGATIVE
Ketones, ur: 20 mg/dL — AB
Leukocytes,Ua: NEGATIVE
Nitrite: NEGATIVE
Protein, ur: NEGATIVE mg/dL
Specific Gravity, Urine: 1.03 (ref 1.005–1.030)
Squamous Epithelial / HPF: NONE SEEN /HPF (ref 0–5)
pH: 5 (ref 5.0–8.0)

## 2023-06-23 LAB — LIPASE, BLOOD: Lipase: 20 U/L (ref 11–51)

## 2023-06-23 LAB — GLUCOSE, CAPILLARY: Glucose-Capillary: 149 mg/dL — ABNORMAL HIGH (ref 70–99)

## 2023-06-23 LAB — BRAIN NATRIURETIC PEPTIDE: B Natriuretic Peptide: 462.4 pg/mL — ABNORMAL HIGH (ref 0.0–100.0)

## 2023-06-23 LAB — PROCALCITONIN: Procalcitonin: 0.11 ng/mL

## 2023-06-23 MED ORDER — SODIUM CHLORIDE 0.9 % IV BOLUS
500.0000 mL | Freq: Once | INTRAVENOUS | Status: AC
  Administered 2023-06-23: 500 mL via INTRAVENOUS

## 2023-06-23 MED ORDER — LEVALBUTEROL HCL 1.25 MG/0.5ML IN NEBU
1.2500 mg | INHALATION_SOLUTION | Freq: Two times a day (BID) | RESPIRATORY_TRACT | Status: DC
  Administered 2023-06-23 – 2023-06-25 (×5): 1.25 mg via RESPIRATORY_TRACT
  Filled 2023-06-23 (×5): qty 0.5

## 2023-06-23 MED ORDER — ALPRAZOLAM 0.25 MG PO TABS
0.2500 mg | ORAL_TABLET | Freq: Two times a day (BID) | ORAL | Status: DC | PRN
  Administered 2023-06-23 – 2023-06-24 (×4): 0.25 mg via ORAL
  Filled 2023-06-23 (×4): qty 1

## 2023-06-23 MED ORDER — AMIODARONE HCL IN DEXTROSE 360-4.14 MG/200ML-% IV SOLN
30.0000 mg/h | INTRAVENOUS | Status: DC
  Administered 2023-06-23 – 2023-06-24 (×4): 30 mg/h via INTRAVENOUS
  Filled 2023-06-23 (×5): qty 200

## 2023-06-23 MED ORDER — ADULT MULTIVITAMIN W/MINERALS CH: 1.0000 | ORAL_TABLET | Freq: Every day | ORAL | Status: DC

## 2023-06-23 MED ORDER — OXYCODONE HCL 5 MG PO TABS
5.0000 mg | ORAL_TABLET | ORAL | Status: DC | PRN
  Administered 2023-06-23 – 2023-06-24 (×5): 10 mg via ORAL
  Filled 2023-06-23 (×5): qty 2

## 2023-06-23 MED ORDER — POLYETHYLENE GLYCOL 3350 17 G PO PACK
17.0000 g | PACK | Freq: Every day | ORAL | Status: DC | PRN
  Administered 2023-06-23: 17 g via ORAL
  Filled 2023-06-23: qty 1

## 2023-06-23 MED ORDER — ACETAMINOPHEN 325 MG PO TABS: 650.0000 mg | ORAL_TABLET | ORAL | Status: DC | PRN

## 2023-06-23 MED ORDER — MIDODRINE HCL 5 MG PO TABS
5.0000 mg | ORAL_TABLET | Freq: Three times a day (TID) | ORAL | Status: DC
  Administered 2023-06-23 (×3): 5 mg via ORAL
  Filled 2023-06-23 (×3): qty 1

## 2023-06-23 MED ORDER — IPRATROPIUM BROMIDE 0.02 % IN SOLN
0.5000 mg | Freq: Two times a day (BID) | RESPIRATORY_TRACT | Status: DC
  Administered 2023-06-23 – 2023-06-25 (×5): 0.5 mg via RESPIRATORY_TRACT
  Filled 2023-06-23 (×5): qty 2.5

## 2023-06-23 MED ORDER — VANCOMYCIN HCL IN DEXTROSE 1-5 GM/200ML-% IV SOLN
1000.0000 mg | Freq: Two times a day (BID) | INTRAVENOUS | Status: DC
  Administered 2023-06-23 – 2023-06-24 (×2): 1000 mg via INTRAVENOUS
  Filled 2023-06-23 (×3): qty 200

## 2023-06-23 MED ORDER — DOCUSATE SODIUM 100 MG PO CAPS: 100.0000 mg | ORAL_CAPSULE | Freq: Two times a day (BID) | ORAL | Status: DC | PRN

## 2023-06-23 MED ORDER — DILTIAZEM HCL-DEXTROSE 125-5 MG/125ML-% IV SOLN (PREMIX)
5.0000 mg/h | INTRAVENOUS | Status: DC
  Administered 2023-06-23: 5 mg/h via INTRAVENOUS
  Filled 2023-06-23: qty 125

## 2023-06-23 MED ORDER — ROSUVASTATIN CALCIUM 10 MG PO TABS
10.0000 mg | ORAL_TABLET | Freq: Every day | ORAL | Status: DC
  Administered 2023-06-23 – 2023-06-25 (×3): 10 mg via ORAL
  Filled 2023-06-23 (×4): qty 1

## 2023-06-23 MED ORDER — AMIODARONE IV BOLUS ONLY 150 MG/100ML
150.0000 mg | Freq: Once | INTRAVENOUS | Status: AC
  Administered 2023-06-23: 150 mg via INTRAVENOUS

## 2023-06-23 MED ORDER — CALCIUM GLUCONATE-NACL 1-0.675 GM/50ML-% IV SOLN
1.0000 g | Freq: Once | INTRAVENOUS | Status: AC
  Administered 2023-06-23: 1000 mg via INTRAVENOUS
  Filled 2023-06-23: qty 50

## 2023-06-23 MED ORDER — PANTOPRAZOLE SODIUM 40 MG PO TBEC
40.0000 mg | DELAYED_RELEASE_TABLET | Freq: Every day | ORAL | Status: DC
  Administered 2023-06-24 – 2023-06-25 (×2): 40 mg via ORAL
  Filled 2023-06-23 (×3): qty 1

## 2023-06-23 MED ORDER — MIRTAZAPINE 15 MG PO TABS
7.5000 mg | ORAL_TABLET | Freq: Every day | ORAL | Status: DC
  Administered 2023-06-23 – 2023-06-24 (×2): 7.5 mg via ORAL
  Filled 2023-06-23 (×2): qty 1

## 2023-06-23 MED ORDER — SODIUM CHLORIDE 0.9 % IV SOLN
2.0000 g | Freq: Three times a day (TID) | INTRAVENOUS | Status: DC
  Administered 2023-06-23 – 2023-06-25 (×6): 2 g via INTRAVENOUS
  Filled 2023-06-23 (×7): qty 12.5

## 2023-06-23 MED ORDER — APIXABAN 5 MG PO TABS
5.0000 mg | ORAL_TABLET | Freq: Two times a day (BID) | ORAL | Status: DC
  Administered 2023-06-23 – 2023-06-25 (×5): 5 mg via ORAL
  Filled 2023-06-23 (×5): qty 1

## 2023-06-23 MED ORDER — CHLORHEXIDINE GLUCONATE CLOTH 2 % EX PADS
6.0000 | MEDICATED_PAD | Freq: Every day | CUTANEOUS | Status: DC
  Administered 2023-06-23 – 2023-06-24 (×2): 6 via TOPICAL

## 2023-06-23 MED ORDER — SODIUM CHLORIDE 0.9 % IV SOLN
2.0000 g | Freq: Once | INTRAVENOUS | Status: AC
  Administered 2023-06-23: 2 g via INTRAVENOUS
  Filled 2023-06-23: qty 12.5

## 2023-06-23 MED ORDER — SODIUM CHLORIDE 0.9% FLUSH: 10.0000 mL | INTRAVENOUS | Status: DC | PRN

## 2023-06-23 MED ORDER — VITAMIN C 500 MG PO TABS
500.0000 mg | ORAL_TABLET | Freq: Two times a day (BID) | ORAL | Status: DC
  Administered 2023-06-23 – 2023-06-25 (×4): 500 mg via ORAL
  Filled 2023-06-23 (×4): qty 1

## 2023-06-23 MED ORDER — ORAL CARE MOUTH RINSE: 15.0000 mL | OROMUCOSAL | Status: DC | PRN

## 2023-06-23 MED ORDER — SODIUM CHLORIDE 0.9 % IV BOLUS (SEPSIS): 250.0000 mL | Freq: Once | INTRAVENOUS | Status: DC

## 2023-06-23 MED ORDER — ENSURE ENLIVE PO LIQD: 237.0000 mL | Freq: Two times a day (BID) | ORAL | Status: DC

## 2023-06-23 MED ORDER — AMIODARONE HCL IN DEXTROSE 360-4.14 MG/200ML-% IV SOLN
60.0000 mg/h | INTRAVENOUS | Status: AC
  Administered 2023-06-23 (×2): 60 mg/h via INTRAVENOUS
  Filled 2023-06-23: qty 200

## 2023-06-23 MED ORDER — VANCOMYCIN HCL IN DEXTROSE 1-5 GM/200ML-% IV SOLN: 1000.0000 mg | Freq: Once | INTRAVENOUS | Status: DC

## 2023-06-23 MED ORDER — SODIUM CHLORIDE 0.9 % IV BOLUS (SEPSIS)
1000.0000 mL | Freq: Once | INTRAVENOUS | Status: AC
  Administered 2023-06-23: 1000 mL via INTRAVENOUS

## 2023-06-23 MED ORDER — SODIUM CHLORIDE 0.9% FLUSH
10.0000 mL | Freq: Two times a day (BID) | INTRAVENOUS | Status: DC
  Administered 2023-06-23 – 2023-06-25 (×5): 10 mL

## 2023-06-23 MED ORDER — POLYETHYLENE GLYCOL 3350 17 G PO PACK
17.0000 g | PACK | Freq: Every day | ORAL | Status: DC
  Administered 2023-06-23: 17 g via ORAL
  Filled 2023-06-23 (×2): qty 1

## 2023-06-23 MED ORDER — ADULT MULTIVITAMIN LIQUID CH
15.0000 mL | Freq: Every day | ORAL | Status: DC
  Filled 2023-06-23 (×2): qty 15

## 2023-06-23 MED ORDER — VANCOMYCIN HCL 1750 MG/350ML IV SOLN
1750.0000 mg | Freq: Once | INTRAVENOUS | Status: AC
  Administered 2023-06-23: 1750 mg via INTRAVENOUS
  Filled 2023-06-23: qty 350

## 2023-06-23 MED ORDER — DOCUSATE SODIUM 100 MG PO CAPS
100.0000 mg | ORAL_CAPSULE | Freq: Two times a day (BID) | ORAL | Status: DC
  Administered 2023-06-23 – 2023-06-24 (×3): 100 mg via ORAL
  Filled 2023-06-23 (×4): qty 1

## 2023-06-23 MED ORDER — BOOST / RESOURCE BREEZE PO LIQD CUSTOM
1.0000 | Freq: Three times a day (TID) | ORAL | Status: DC
  Administered 2023-06-23: 1 via ORAL

## 2023-06-23 MED ORDER — DILTIAZEM HCL 25 MG/5ML IV SOLN
10.0000 mg | Freq: Once | INTRAVENOUS | Status: AC
  Administered 2023-06-23: 10 mg via INTRAVENOUS
  Filled 2023-06-23: qty 5

## 2023-06-23 NOTE — ED Notes (Signed)
Lab called at this time. Reported that first lactic was 3.4 and second lactic was 2.7.

## 2023-06-23 NOTE — ED Notes (Signed)
Patient cleaned up, had small smear of bowel movement.  Clean brief applied.

## 2023-06-23 NOTE — ED Notes (Signed)
Patient reports he feels like he has had a bowel movement, but does not want to be cleaned up right now, because "I will be in more distress if you attempt to roll me to clean me up."  Patient educated on risks of refusing to be cleaned up and risk of skin break down, and patient verbalized understanding and states "when my heart rate gets under control, you can clean me up."

## 2023-06-23 NOTE — Sepsis Progress Note (Signed)
Elink monitoring for the code sepsis protocol.  

## 2023-06-23 NOTE — H&P (Signed)
NAME:  Charles Hickman, MRN:  [id number], DOB:  [birth year], LOS: 0 ADMISSION DATE:  06/23/2023, CONSULTATION DATE:  06/23/2023 REFERRING MD:  Dr. Dolores Frame, CHIEF COMPLAINT:  Chest tightness, SOB   Brief Pt Description / Synopsis:  62 y.o. 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  Concern for sepsis of unknown etiology.  History of Present Illness:  Charles Hickman is a 62 y.o. 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  He reports that he has been constipated for a couple of days, and early this morning while on the bedpan he suddenly developed chest tightness, shortness of breath, and dizziness.  He also reports some mild dysuria, but no flank pain. On exam noted to have abdominal tenderness/pain upon palpation of the lower quadrant.  He denies palpitations, sputum production, fevers/chills, nausea, vomiting, diarrhea. He endorses that during his recent hospital admission, he was placed on Amiodarone and his Cardizem was discontinued and metoprolol decreased due to hypotension.  Upon EMS arrival he was noted to be in A.fib with RVR.  Of note, he was recently admitted to [facility] from 06/13/23 through 06/20/23 for treatment of Acute on Chronic Hypoxic Respiratory Failure due to chronic right lung atelectasis with recurrent pleural effusion and stage IV adenocarcinoma lung with skeletal metastasis  and presumed  pneumonia, along with Atrial fibrillation w/ RVR requiring Amiodarone.  He was followed by Cardiology during the admission, was discharged home on oral Amiodarone 200 mg BID with follow up planned with Cardiology.  ED Course: Initial Vital Signs: Temperature 98 F, Pulse 155, RR 19, BP 86/62, SpO2 94% Significant Labs: Sodium 134, glucose 164, BUN 10, Creatinine 0.46, Alkaline phosphatase 199, BNP 462, HS Troponin 27, lactic acid 3.4, WBC 20.6, Hgb 12.1, Hct 38.7, platelets 138 COVID PCR negative Imaging Chest X-ray>>IMPRESSION: 1. Unchanged white out of the right chest due to pulmonary collapse and pleural fluid. 2. Unchanged interstitial opacity in the left lung attributed to carcinomatosis and edema based on preceding chest CT. 3. No new abnormality. Medications Administered: 2.5 L of IV fluids, cefepime & Vancomycin, 10 mg Cardizem  Following Cardizem administration BP became very soft (92/63).  He was then given Amiodarone bolus and placed on infusion.  PCCM asked to admit for further workup and treatment.  Please see "significant hospital events" section below for full detailed hospital course.  Pertinent  Medical History   Past Medical History:  Diagnosis Date   Abnormal EKG 11/15/2017   Abnormal stress test    Atrial fibrillation (HCC)    CAD in native artery    a. 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 No EKG changes concerning for ischemia at peak stress or in recovery. Mod to high risk scan; b. LHC 12/15/17: pLAD 75% w/ FFR 0.74 s/p PCI/DES, mRCA 90%, CTO dRCA w/ L-R  colatts, EF 45-50%, inf HK    Diabetes mellitus (HCC)    HCV (hepatitis C virus)    HLD (hyperlipidemia)    Hypertension    Large cell lymphoma (HCC)    a. s/p 6 cycles of R-CHOP   Peripheral neuropathy    Stroke [facility])    Systolic dysfunction    a. TTE 8/16: EF > 55%,  aortic sclerosis, normal RV systolic function; b. LHC 12/15/17: EF 45-50%, inf HK    Micro Data:  7/4: SARS-CoV-2/RSV/Flu PCR>>negative 7/4: Blood culture x2>>  Antimicrobials:   Anti-infectives (From admission, onward)    Start     Dose/Rate Route Frequency Ordered Stop   06/23/23 0530  vancomycin (VANCOCIN) IVPB 1000 mg/200 mL premix  Status:  Discontinued        1,000 mg 200 mL/hr over 60 Minutes Intravenous  Once 06/23/23 0518 06/23/23 0528   06/23/23 0530  ceFEPIme (MAXIPIME) 2 g in sodium chloride 0.9 % 100 mL IVPB        2 g 200 mL/hr over 30 Minutes Intravenous  Once 06/23/23 0518 06/23/23 0558   06/23/23 0530  vancomycin (VANCOREADY) IVPB 1750 mg/350 mL        1,750 mg 175 mL/hr over 120 Minutes Intravenous  Once 06/23/23 0528 06/23/23 0846        Significant Hospital Events: Including procedures, antibiotic start and stop dates in addition to other pertinent events   7/4: Presented to ED with chest tightness and SOB. Found to be in A.fib w/ RVR, became hypotensive following cardizem, placed on Amiodarone.  PCCM asked to admit.  Following ICU arrival to ICU, has since converted back to NSR and BP stable not requiring vasopressors.  Interim History / Subjective:  -Pt reports his shortness of breath is much improved, chest tightness resolved, no palpitations -Does report nonproductive cough, but no change in sputum -Afebrile, has converted to NSR on Amiodarone, BP soft but stable NOT requiring vasopressors -Noted to have leukocytosis, no new infiltrate on CXR, but does complain of mild dysuria and lower quadrant abdominal pain/tenderness to palpation ~ check PCT, will obtain UA and continue empiric ABX for now  -Alkaline phos elevated ~ obtain RUQ US  Objective   Blood pressure 97/77, pulse 60, temperature 98 F (36.7 C), resp. rate 19, height 5\' 10"  (1.778 m), weight 70 kg, SpO2 91 %.        Intake/Output Summary (Last 24 hours) at 06/23/2023 0740 Last data filed at  06/23/2023 1610 Gross per 24 hour  Intake 756.67 ml  Output --  Net 756.67 ml   Filed Weights   06/23/23 0422  Weight: 70 kg    Examination: General: Acute on chronically ill-appearing frail male, laying in bed, on 3 L nasal cannula, no acute distress HENT: Atraumatic, normocephalic, neck supple, no JVD, dry mucous membranes with ulcerations in the roof of his mouth and medial aspect of tongue Lungs: Clear breath sounds to the left, diminished on the right side, even, nonlabored Cardiovascular: Regular rate and rhythm, s1s2, no murmurs, rubs, gallops Abdomen: Soft, tender to palpation in lower quadrants, nondistended, no guarding or rebound tenderness, bowel sounds positive x 4 Extremities: Generalized weakness, no deformities, no edema Neuro: Awake and alert, oriented x 4, moves all extremities to command, no focal deficits, speech clear, pupils PERRLA GU: External male catheter in place  Resolved Hospital Problem list     Assessment & Plan:   #Atrial Fibrillation w/ RVR ~ CONVERTED TO NSR #Transient hypotension: Cardiogenic shock  exacerbated by Cardizem administration ~ RESOLVED #Mildly elevated Troponin due to demand ischemia #Chronic HFpEF without acute exacerbation PMHx: A.fib on Eliquis, HFpEF, CAD, Hypertension  Echocardiogram 01/29/21: LVEF 55-60%, Grade I DD, RV systolic function normal  (Echo performed 06/13/23 was of poor quality and non diagnostic) -Continuous cardiac monitoring -Maintain MAP >65 -Cautious IV fluids -Vasopressors as needed to maintain MAP goal ~ NOT REQUIRING -Continue outpatient Midodrine -Trend lactic acid until normalized -HS Troponin peaked at 27 -TSH normal 0.8 on 06/14/23 -Continue outpatient Eliquis for anticoagulation -Consider repeat Echocardiogram & Cardiology consultation  #Concern for Sepsis of unknown etiology, currently no obvious source of infection (Meets SIRS Criteria on presentation: HR >90, WBC 20.6, lactic 3.4) Treated for  suspected Pneumonia on previous recent admission Does complain of mild dysuria (no flank pain) and lower quadrant abdominal tenderness -Monitor fever curve -Trend WBC's & Procalcitonin -Follow cultures as above -Continue empiric Cefepime & Vancomycin pending cultures & sensitivities -CXR without new infiltrate or abnormality, no reports of change in sputum -Obtain UA as he does complain of some mild dysuria  #Acute on Chronic Hypoxic Respiratory Failure due to Right Lung Atelectasis, recurrent right pleural effusion, Stage IV Adenocarcinoma #COPD without acute exacerbation PMHx: COPD on supplemental O2 at baseline, former smoker Has chronic Pleurx catheter in place for approximately 3 to 4 weeks -Supplemental O2 as needed to maintain O2 sats 88 to 92% -BiPAP if needed, wean as tolerated -Follow intermittent Chest X-ray & ABG as needed -Bronchodilators  -Diuresis as BP and renal function permits -Daily Drainage of pleural effusions via Pleurx catheter  -Pulmonary toilet as able  #Elevated Alkaline Phophatase -Check RUQ [place]  #Mild Hyponatremia  -Monitor I&O's / urinary output -Follow BMP -Ensure adequate renal perfusion -Avoid nephrotoxic agents as able -Replace electrolytes as indicated ~ Pharmacy following for assistance with electrolyte replacement -Received 2.5 L IV fluids in ED  #Anemia, suspect due to chemotherapy #Thrombocytopenia, suspect due to chemotherapy -Monitor for S/Sx of bleeding -Trend CBC -Eliquis for Anticoagulation/VTE Prophylaxis  -Transfuse for Hgb <7  #Diabetes Mellitus Type II -CBG's ac & hs; Target range of 140 to 180 -SSI -Follow ICU Hypo/Hyperglycemia protocol   #Stage IV Lung Adenocarcinoma with mets to bone -Follow up with Oncology outpatient      Best Practice (right click and "Reselect all SmartList Selections" daily)   Diet/type: Regular consistency (see orders) DVT prophylaxis: DOAC GI prophylaxis: PPI Lines: N/A Foley:  N/A Code  Status:  full code Last date of multidisciplinary goals of care discussion [7/4]  7/4: Pt updated at bedside, all questions answered.  Labs   CBC: Recent Labs  Lab 06/17/23 0628 06/18/23 0625 06/19/23 0700 06/23/23 0426  WBC 13.8* 16.3* 17.0* 20.6*  HGB 10.7* 12.7* 12.4* 12.1*  HCT 32.1* 39.9 37.9* 38.7*  MCV 87.0 89.1 87.1 90.6  PLT 106* 100* 122* 138*    Basic Metabolic Panel: Recent Labs  Lab 06/17/23 0628 06/18/23 0625 06/19/23 0700 06/23/23 0426  NA 134* 136 132* 134*  K 3.1* 5.0 3.4* 3.5  CL 98 97* 95* 94*  CO2 28 31 31 26   GLUCOSE 138* 121* 91 164*  BUN 8 7* 9 10  CREATININE 0.41* 0.37* <0.30* 0.46*  CALCIUM 8.3* 8.5* 7.9* 7.6*  PHOS 1.4* 1.8* 2.5  --    GFR: Estimated Creatinine Clearance: 92.4 mL/min (A) (by C-G formula based on SCr of 0.46 mg/dL (L)). Recent Labs  Lab 06/17/23 0628 06/18/23 0625 06/19/23 0700 06/23/23 0426 06/23/23 0502 06/23/23 0619  WBC 13.8* 16.3*  17.0* 20.6*  --   --   LATICACIDVEN  --   --   --   --  3.4* 2.7*    Liver Function Tests: Recent Labs  Lab 06/17/23 0628 06/18/23 0625 06/19/23 0700 06/23/23 0426  AST  --   --   --  27  ALT  --   --   --  14  ALKPHOS  --   --   --  199*  BILITOT  --   --   --  0.9  PROT  --   --   --  5.2*  ALBUMIN 2.5* 2.4* 2.2* 2.1*   Recent Labs  Lab 06/23/23 0426  LIPASE 20   No results for input(s): "AMMONIA" in the last 168 hours.  ABG No results found for: "PHART", "PCO2ART", "PO2ART", "HCO3", "TCO2", "ACIDBASEDEF", "O2SAT"   Coagulation Profile: No results for input(s): "INR", "PROTIME" in the last 168 hours.  Cardiac Enzymes: No results for input(s): "CKTOTAL", "CKMB", "CKMBINDEX", "TROPONINI" in the last 168 hours.  HbA1C: Hemoglobin A1C  Date/Time Value Ref Range Status  01/15/2021 10:29 AM 7.8 (A) 4.0 - 5.6 % Final   Hgb A1c MFr Bld  Date/Time Value Ref Range Status  05/09/2023 04:44 PM 7.3 (H) 4.8 - 5.6 % Final    Comment:    (NOTE) Pre diabetes:           5.7%-6.4%  Diabetes:              >6.4%  Glycemic control for   <7.0% adults with diabetes   09/26/2020 08:47 AM 8.2 (H) 4.6 - 6.5 % Final    Comment:    Glycemic Control Guidelines for People with Diabetes:Non Diabetic:  <6%Goal of Therapy: <7%Additional Action Suggested:  >8%     CBG: Recent Labs  Lab 06/20/23 1939 06/21/23 0001 06/21/23 0056 06/21/23 0343 06/21/23 0845  GLUCAP 153* 66* 90 93 124*    Review of Systems:   Positives in BOLD: Gen: Denies fever, chills, weight change, fatigue, night sweats HEENT: Denies blurred vision, double vision, hearing loss, tinnitus, sinus congestion, rhinorrhea, sore throat, neck stiffness, dysphagia PULM: Denies shortness of breath, cough, sputum production, hemoptysis, wheezing CV: Denies chest pain, edema, orthopnea, paroxysmal nocturnal dyspnea, palpitations GI: Denies abdominal pain, nausea, vomiting, diarrhea, hematochezia, melena, constipation, change in bowel habits GU: Denies dysuria, hematuria, polyuria, oliguria, urethral discharge Endocrine: Denies hot or cold intolerance, polyuria, polyphagia or appetite change Derm: Denies rash, dry skin, scaling or peeling skin change Heme: Denies easy bruising, bleeding, bleeding gums Neuro: Denies headache, numbness, weakness, slurred speech, loss of memory or consciousness   Past Medical History:  He,  has a past medical history of Abnormal EKG (11/15/2017), Abnormal stress test, Atrial fibrillation (HCC), CAD in native artery, Diabetes mellitus (HCC), HCV (hepatitis C virus), HLD (hyperlipidemia), Hypertension, Large cell lymphoma (HCC), Peripheral neuropathy, Stroke (HCC), and Systolic dysfunction.   Surgical History:   Past Surgical History:  Procedure Laterality Date   APPENDECTOMY     CORONARY PRESSURE/FFR STUDY N/A 12/15/2017   Procedure: INTRAVASCULAR PRESSURE WIRE/FFR STUDY;  Surgeon: Iran Ouch, MD;  Location: [facility] INVASIVE CV LAB;  Service: Cardiovascular;   Laterality: N/A;   IR GUIDED DRAIN W CATHETER PLACEMENT  05/02/2023   IR IMAGING GUIDED PORT INSERTION  05/02/2023   IR RADIOLOGIST EVAL & MGMT  05/10/2023   IR US GUIDE BX ASP/DRAIN  05/02/2023   LEFT HEART CATH AND CORONARY ANGIOGRAPHY N/A 12/15/2017   Procedure: LEFT HEART CATH AND  CORONARY ANGIOGRAPHY;  Surgeon: Iran Ouch, MD;  Location: [facility] INVASIVE CV LAB;  Service: Cardiovascular;  Laterality: N/A;   TONSILLECTOMY AND ADENOIDECTOMY       Social History:   reports that he quit smoking about 7 weeks ago. His smoking use included cigarettes. He smoked an average of .5 packs per day. He has never used smokeless tobacco. He reports current alcohol use. He reports that he does not use drugs.   Family History:  His family history includes Dementia in his maternal grandmother; Diabetes in his father and mother; Hyperlipidemia in his mother; Hypertension in his father and mother; Kidney disease in his father; Stroke in his mother.   Allergies No Known Allergies   Home Medications  Prior to Admission medications   Medication Sig Start Date End Date Taking? Authorizing Provider  acetaminophen (TYLENOL) 500 MG tablet Take 500 mg by mouth every 6 (six) hours as needed.    [provider]  amiodarone (PACERONE) 200 MG tablet Take 1 tablet (200 mg total) by mouth 2 (two) times daily. 06/20/23   Enedina Finner, MD  apixaban (ELIQUIS) 5 MG TABS tablet TAKE 1 TABLET BY MOUTH TWICE A DAY 04/12/23   Antonieta Iba, MD  calcium carbonate ([facility] CALCIUM) 600 MG tablet Take 2 tablets by mouth daily. 06/03/23   [provider]  cholecalciferol (VITAMIN D3) 25 MCG (1000 UNIT) tablet Take 1 tablet (1,000 Units total) by mouth daily. 06/03/23   Alinda Dooms, NP  fentaNYL (DURAGESIC) 25 MCG/HR Place 1 patch onto the skin every 3 (three) days. 05/18/23   Jeralyn Ruths, MD  ipratropium (ATROVENT) 0.02 % nebulizer solution Take 2.5 mLs (0.5 mg total) by nebulization 2 (two) times daily.  06/20/23   Enedina Finner, MD  JARDIANCE 25 MG TABS tablet TAKE 1 TABLET BY MOUTH EVERY DAY 02/22/22   Worthy Rancher B, FNP  levalbuterol (XOPENEX) 1.25 MG/0.5ML nebulizer solution Take 1.25 mg by nebulization 2 (two) times daily. 06/20/23   Enedina Finner, MD  lidocaine-prilocaine (EMLA) cream Apply to affected area once 04/28/23   Jeralyn Ruths, MD  metoprolol succinate (TOPROL-XL) 25 MG 24 hr tablet Take 1 tablet (25 mg total) by mouth daily. TAKE WITH OR IMMEDIATELY FOLLOWING A MEAL. 06/20/23   Enedina Finner, MD  midodrine (PROAMATINE) 5 MG tablet Take 1 tablet (5 mg total) by mouth 3 (three) times daily with meals. 06/20/23   Enedina Finner, MD  mirtazapine (REMERON) 7.5 MG tablet Take 1 tablet (7.5 mg total) by mouth at bedtime. For mood, appetite, and sleep 06/03/23   Alinda Dooms, NP  Multiple Vitamin (MULTI-VITAMIN) tablet Take 1 tablet by mouth daily.    [provider]  naloxone [facility]) nasal spray 4 mg/0.1 mL See admin instructions. 04/22/23   [provider]  nicotine (NICODERM CQ - DOSED IN MG/24 HOURS) 14 mg/24hr patch Place 1 patch (14 mg total) onto the skin daily. 06/21/23   Enedina Finner, MD  nitroGLYCERIN (NITROSTAT) 0.4 MG SL tablet Place 1 tablet (0.4 mg total) under the tongue every 5 (five) minutes as needed for chest pain. 05/28/22   Antonieta Iba, MD  nystatin (MYCOSTATIN) 100000 UNIT/ML suspension Take 5 mLs (500,000 Units total) by mouth 4 (four) times daily. Swish and spit. 05/12/23   Jeralyn Ruths, MD  ondansetron (ZOFRAN) 8 MG tablet Take 1 tablet (8 mg total) by mouth every 8 (eight) hours as needed for nausea or vomiting. Start on the third day after carboplatin.  04/28/23   Jeralyn Ruths, MD  oxyCODONE (OXY IR/ROXICODONE) 5 MG immediate release tablet Take 1-2 tablets (5-10 mg total) by mouth every 4 (four) hours as needed for severe pain. 06/20/23   Enedina Finner, MD  prochlorperazine (COMPAZINE) 10 MG tablet Take 1 tablet (10 mg total) by mouth every 6 (six)  hours as needed for nausea or vomiting. 04/28/23   Jeralyn Ruths, MD  rosuvastatin (CRESTOR) 10 MG tablet TAKE 1 TABLET BY MOUTH EVERY DAY 05/26/23   Antonieta Iba, MD     Care time: 55 minutes     Harlon Ditty, AGACNP-BC [facility] Prefer epic messenger for cross cover needs If after hours, please call E-link

## 2023-06-23 NOTE — Progress Notes (Signed)
PHARMACY -  BRIEF ANTIBIOTIC NOTE   Pharmacy has received consult(s) for Vanc, Cefepime from an ED provider.  The patient's profile has been reviewed for ht/wt/allergies/indication/available labs.    One time order(s) placed for Vancomycin 1750 mg IV X 1 and Cefepime 2 gm IV X 1.   Further antibiotics/pharmacy consults should be ordered by admitting physician if indicated.                       Thank you, Clemmie Marxen D 06/23/2023  5:29 AM

## 2023-06-23 NOTE — Consult Note (Signed)
WOC Nurse Consult Note: Reason for Consult:stage 2 pressure injury to medial buttocks. Last seen by my associate H. Bullins on 06/15/23 for this wound Wound type:pressure plus moisture and friction Pressure Injury POA: Yes Measurement:10cm x 7cm x 0.1cm Wound bed:red, moist Drainage (amount, consistency, odor) small serous Periwound:intact Dressing procedure/placement/frequency:Turning and repositioning is in place and while in ICU, patient is on a mattress replacement with low coefficient bed linen system in place. Time in the supine position is to be minimized, Heel floated. I will continue the POC implemented by Ms Bullins on 06/15/23, using xeroform to cover with affected area and topping with a silicone foam placed with the dressing "tip" oriented pointing away from the anus. Patient's wife was instructed during last admission that the application of Desitin ointment may be used at home post discharge.  A photograph of the affected area is requested of Dr Aundria Rud to be taken and uploaded to the EMR for future reference.  WOC nursing team will not follow, but will remain available to this patient, the nursing and medical teams.  Please re-consult if needed.  Thank you for inviting [place] to participate in this patient's Plan of Care.  Ladona Mow, MSN, RN, CNS, GNP, Leda Min, [employer], [employer] phone:  [phone]

## 2023-06-23 NOTE — Consult Note (Signed)
Pharmacy Antibiotic Note  Charles Hickman is a 62 y.o. male admitted on 06/23/2023 with sepsis.  Pharmacy has been consulted for vancomycin and cefepime dosing.  Vancomycin 1750 mg IV x 1 given 7/4 @ 0601  Plan: Start vancomycin 1 gram IV every 12 hours Estimated AUC 522, Cmin 14.2 Wt = 67.7 kg, Scr rounded to 0.8, Vd coefficient 0.72 Vancomycin levels at steady state or as clinically indicated Start cefepime 2 grams IV every 8 hours Follow renal function and cultures for adjustments  Height: 5\' 10"  (177.8 cm) Weight: 67.7 kg (149 lb 4 oz) IBW/kg (Calculated) : 73  Temp (24hrs), Avg:98 F (36.7 C), Min:97.9 F (36.6 C), Max:98 F (36.7 C)  Recent Labs  Lab 06/17/23 0628 06/18/23 0625 06/19/23 0700 06/23/23 0426 06/23/23 0502 06/23/23 0619 06/23/23 0856 06/23/23 1002  WBC 13.8* 16.3* 17.0* 20.6*  --   --  23.6*  --   CREATININE 0.41* 0.37* <0.30* 0.46*  --   --   --   --   LATICACIDVEN  --   --   --   --  3.4* 2.7*  --  1.6    Estimated Creatinine Clearance: 89.3 mL/min (A) (by C-G formula based on SCr of 0.46 mg/dL (L)).    No Known Allergies  Antimicrobials this admission: cefepime 7/4 >>  vancomycin 7/4 >>   Dose adjustments this admission: N/A  Microbiology results: 7/4 BCx: pending 7/4 MRSA PCR: ordered  Thank you for allowing pharmacy to be a part of this patient's care.  Barrie Folk 06/23/2023 12:00 PM

## 2023-06-23 NOTE — ED Triage Notes (Signed)
Patient BIB [facility] c/o generalized chest pain.  EMS reports patient in afib rvr upon their arrival.  Patient reports recent admission where he was taken off cardizem.    20 L FA 10 mg cardizem 110-170  102/64 93% chronic 3 L [place]

## 2023-06-23 NOTE — Progress Notes (Signed)
Initial Nutrition Assessment  DOCUMENTATION CODES:   Severe malnutrition in context of chronic illness  INTERVENTION:   Boost Breeze po TID, each supplement provides 250 kcal and 9 grams of protein  Magic cup TID with meals, each supplement provides 290 kcal and 9 grams of protein  Liquid MVI po daily (per pt request)  Vitamin C 500mg  po BID   Liberalize diet   Pt at high refeed risk; recommend monitor potassium, magnesium and phosphorus labs daily until stable  Daily weights   NUTRITION DIAGNOSIS:   Severe Malnutrition related to cancer and cancer related treatments as evidenced by severe fat depletion, severe muscle depletion, percent weight loss.  GOAL:   Patient will meet greater than or equal to 90% of their needs  MONITOR:   PO intake, Supplement acceptance, Labs, Weight trends, Skin, I & O's  REASON FOR ASSESSMENT:   Malnutrition Screening Tool    ASSESSMENT:   62 y/o male with metastatic stage IV lung cancer s/p chemotherapy, large cell lymphoma, mucositis, COPD, malignant pleural effusions requiring pleurx catheter, HLD, CAD s/p heart cath, HCV, HTN, s/p Left hip hemiarthroplasty (05/2023), Afib and DM who is admitted with Afib, hypotension and concerns for sepsis.  Met with pt in room today. Pt is well known to nutrition department from previous admissions. Pt with decreased appetite and oral intake pta r/t chemotherapy treatments and frequent admissions. Pt eating 25-50% of meals during his last admission; pt just discharged on 7/2. Pt reports that he has been drinking a protein shake at home but he does not recall the name. Pt drinking some Ensure in hospital but reports that he really does not like them. Pt reports that he likes the [employer] "ok". Pt is willing to take Boost Breeze and [employer]. Of note, pt prefers plastic silverware only and pt does not eat chocolate per his cardiologist's recommendation. Pt ate a few bites of fruit and a few bites of ice  cream for breakfast today. RD discussed with pt the importance of adequate nutrition needed to preserve lean muscle. RD will add supplements and vitamins to help pt meet his estimated needs. Pt is at refeed risk.   Per chart, pt is down 51lbs(25%) over the past year; this is severe weight loss.     Medications reviewed and include: colace, remeron, MVI, protonix, miralax, cefepime   Labs reviewed: Na 134(L), K 3.5 wnl, creat 0.46(L) BNP- 462.4(H) Wbc- 23.6(H), Hgb 10.2(L), Hct 32.8(L) AIC 7.3(H)- 5/20  NUTRITION - FOCUSED PHYSICAL EXAM:  Flowsheet Row Most Recent Value  Orbital Region Moderate depletion  Upper Arm Region Severe depletion  Thoracic and Lumbar Region Severe depletion  Buccal Region Moderate depletion  Temple Region Moderate depletion  Clavicle Bone Region Severe depletion  Clavicle and Acromion Bone Region Severe depletion  Scapular Bone Region Severe depletion  Dorsal Hand Severe depletion  Patellar Region Severe depletion  Anterior Thigh Region Severe depletion  Posterior Calf Region Severe depletion  Edema (RD Assessment) Mild  Hair Reviewed  Eyes Reviewed  Mouth Reviewed  Skin Reviewed  Nails Reviewed   Diet Order:   Diet Order             Diet Heart Room service appropriate? Yes; Fluid consistency: Thin  Diet effective now                  EDUCATION NEEDS:   Education needs have been addressed  Skin:  Skin Assessment: Reviewed RN Assessment (Stage II buttocks- 10cm x 7cm  x 0.1cm)  Last BM:  7/4  Height:   Ht Readings from Last 1 Encounters:  06/23/23 5\' 10"  (1.778 m)    Weight:   Wt Readings from Last 1 Encounters:  06/23/23 67.7 kg    Ideal Body Weight:  75.45 kg  BMI:  Body mass index is 21.42 kg/m.  Estimated Nutritional Needs:   Kcal:  2100-2400kcal/day  Protein:  105-120g/day  Fluid:  1.7-2.0L/day  Betsey Holiday MS, RD, LDN Please refer to [facility] for RD and/or RD on-call/weekend/after hours pager

## 2023-06-23 NOTE — ED Notes (Signed)
Advised nurse that patient has ready bed 

## 2023-06-23 NOTE — ED Provider Notes (Signed)
[facility] Provider Note    Event Date/Time   First MD Initiated Contact with Patient 06/23/23 0424     (approximate)   History   Chest Pain   HPI  Charles Hickman is a 62 y.o. male brought to the ED via EMS from home with a chief complaint of shortness of breath and chest tightness.  Patient with a history of atrial fibrillation on amiodarone, new metoprolol and Eliquis.  Reports shortness of breath and chest tightness symptoms x 2 hours.  Patient reports recent hospitalization where he was taken off Cardizem.  History of right lung adenocarcinoma, large cell lymphoma.  Denies fever/chills, abdominal pain, nausea/vomiting or dizziness.     Past Medical History   Past Medical History:  Diagnosis Date   Abnormal EKG 11/15/2017   Abnormal stress test    Atrial fibrillation (HCC)    CAD in native artery    a. 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 No EKG changes concerning for ischemia at peak stress or in recovery. Mod to high risk scan; b. LHC 12/15/17: pLAD 75% w/ FFR 0.74 s/p PCI/DES, mRCA 90%, CTO dRCA w/ L-R colatts, EF 45-50%, inf HK    Diabetes mellitus (HCC)    HCV (hepatitis C virus)    HLD (hyperlipidemia)    Hypertension    Large cell lymphoma (HCC)    a. s/p 6 cycles of R-CHOP   Peripheral neuropathy    Stroke [facility])    Systolic dysfunction    a. TTE 8/16: EF > 55%, aortic sclerosis, normal RV systolic function; b. LHC 12/15/17: EF 45-50%, inf HK     Active Problem List   Patient Active Problem List   Diagnosis Date Noted   Atrial fibrillation with RVR (HCC) 06/23/2023   Protein-calorie malnutrition, severe 06/15/2023   Palliative care encounter 06/14/2023   Acute on chronic respiratory failure with hypoxemia (HCC) 06/14/2023   Persistent atrial fibrillation (HCC) 06/14/2023   Pleural effusion on  right 06/14/2023   Acute and chronic respiratory failure with hypoxia (HCC) 06/13/2023   Cancer, metastatic to bone (HCC) 06/03/2023   Port-A-Cath in place 06/03/2023   Anxiety associated with cancer diagnosis (HCC) 06/03/2023   Pain due to malignant neoplasm metastatic to bone (HCC) 06/03/2023   Pathological fracture of left hip due to neoplastic disease (HCC) 05/19/2023   Atrial fibrillation with rapid ventricular response (HCC) 05/09/2023   Adenocarcinoma, lung, right (HCC) 04/28/2023   Paroxysmal atrial fibrillation (HCC) 02/16/2023   Palpitations 12/29/2020   Allergic rhinitis 02/16/2019   Fatty liver 03/06/2018   History of hepatitis C 03/06/2018   Hypocalcemia 01/06/2018   Chronic low back pain 01/06/2018   CAD (coronary artery disease), native coronary artery 12/29/2017   Systolic dysfunction 12/16/2017   Nicotine dependence, cigarettes, w unsp disorders 12/16/2017   HLD (hyperlipidemia) 12/16/2017   Essential hypertension 10/03/2017   Diabetes (HCC) 10/03/2017   History of lymphoma 10/03/2017   Diffuse large B cell lymphoma (HCC) 06/16/2015     Past Surgical History   Past Surgical History:  Procedure Laterality Date   APPENDECTOMY     CORONARY PRESSURE/FFR STUDY N/A 12/15/2017   Procedure: INTRAVASCULAR PRESSURE WIRE/FFR STUDY;  Surgeon: Iran Ouch, MD;  Location: [facility] INVASIVE CV LAB;  Service: Cardiovascular;  Laterality: N/A;   IR GUIDED DRAIN W CATHETER  PLACEMENT  05/02/2023   IR IMAGING GUIDED PORT INSERTION  05/02/2023   IR RADIOLOGIST EVAL & MGMT  05/10/2023   IR US GUIDE BX ASP/DRAIN  05/02/2023   LEFT HEART CATH AND CORONARY ANGIOGRAPHY N/A 12/15/2017   Procedure: LEFT HEART CATH AND CORONARY ANGIOGRAPHY;  Surgeon: Iran Ouch, MD;  Location: [facility] INVASIVE CV LAB;  Service: Cardiovascular;  Laterality: N/A;   TONSILLECTOMY AND ADENOIDECTOMY       Home Medications   Prior to Admission medications   Medication Sig Start Date End Date Taking?  Authorizing Provider  acetaminophen (TYLENOL) 500 MG tablet Take 500 mg by mouth every 6 (six) hours as needed.    [provider]  amiodarone (PACERONE) 200 MG tablet Take 1 tablet (200 mg total) by mouth 2 (two) times daily. 06/20/23   Enedina Finner, MD  apixaban (ELIQUIS) 5 MG TABS tablet TAKE 1 TABLET BY MOUTH TWICE A DAY 04/12/23   Antonieta Iba, MD  calcium carbonate ([employer] CALCIUM) 600 MG tablet Take 2 tablets by mouth daily. 06/03/23   [provider]  cholecalciferol (VITAMIN D3) 25 MCG (1000 UNIT) tablet Take 1 tablet (1,000 Units total) by mouth daily. 06/03/23   Alinda Dooms, NP  fentaNYL (DURAGESIC) 25 MCG/HR Place 1 patch onto the skin every 3 (three) days. 05/18/23   Jeralyn Ruths, MD  ipratropium (ATROVENT) 0.02 % nebulizer solution Take 2.5 mLs (0.5 mg total) by nebulization 2 (two) times daily. 06/20/23   Enedina Finner, MD  JARDIANCE 25 MG TABS tablet TAKE 1 TABLET BY MOUTH EVERY DAY 02/22/22   Worthy Rancher B, FNP  levalbuterol (XOPENEX) 1.25 MG/0.5ML nebulizer solution Take 1.25 mg by nebulization 2 (two) times daily. 06/20/23   Enedina Finner, MD  lidocaine-prilocaine (EMLA) cream Apply to affected area once 04/28/23   Jeralyn Ruths, MD  metoprolol succinate (TOPROL-XL) 25 MG 24 hr tablet Take 1 tablet (25 mg total) by mouth daily. TAKE WITH OR IMMEDIATELY FOLLOWING A MEAL. 06/20/23   Enedina Finner, MD  midodrine (PROAMATINE) 5 MG tablet Take 1 tablet (5 mg total) by mouth 3 (three) times daily with meals. 06/20/23   Enedina Finner, MD  mirtazapine (REMERON) 7.5 MG tablet Take 1 tablet (7.5 mg total) by mouth at bedtime. For mood, appetite, and sleep 06/03/23   Alinda Dooms, NP  Multiple Vitamin (MULTI-VITAMIN) tablet Take 1 tablet by mouth daily.    [provider]  naloxone [facility]) nasal spray 4 mg/0.1 mL See admin instructions. 04/22/23   [provider]  nicotine (NICODERM CQ - DOSED IN MG/24 HOURS) 14 mg/24hr patch Place 1 patch (14 mg total) onto the  skin daily. 06/21/23   Enedina Finner, MD  nitroGLYCERIN (NITROSTAT) 0.4 MG SL tablet Place 1 tablet (0.4 mg total) under the tongue every 5 (five) minutes as needed for chest pain. 05/28/22   Antonieta Iba, MD  nystatin (MYCOSTATIN) 100000 UNIT/ML suspension Take 5 mLs (500,000 Units total) by mouth 4 (four) times daily. Swish and spit. 05/12/23   Jeralyn Ruths, MD  ondansetron (ZOFRAN) 8 MG tablet Take 1 tablet (8 mg total) by mouth every 8 (eight) hours as needed for nausea or vomiting. Start on the third day after carboplatin. 04/28/23   Jeralyn Ruths, MD  oxyCODONE (OXY IR/ROXICODONE) 5 MG immediate release tablet Take 1-2 tablets (5-10 mg total) by mouth every 4 (four) hours as needed for severe pain. 06/20/23   Enedina Finner, MD  prochlorperazine (COMPAZINE) 10 MG  tablet Take 1 tablet (10 mg total) by mouth every 6 (six) hours as needed for nausea or vomiting. 04/28/23   Jeralyn Ruths, MD  rosuvastatin (CRESTOR) 10 MG tablet TAKE 1 TABLET BY MOUTH EVERY DAY 05/26/23   Antonieta Iba, MD     Allergies  Patient has no known allergies.   Family History   Family History  Problem Relation Age of Onset   Hypertension Mother    Hyperlipidemia Mother    Stroke Mother    Diabetes Mother    Kidney disease Father    Hypertension Father    Diabetes Father    Dementia Maternal Grandmother      Physical Exam  Triage Vital Signs: ED Triage Vitals [06/23/23 0422]  Enc Vitals Group     BP      Pulse      Resp      Temp      Temp src      SpO2      Weight 154 lb 5.2 oz (70 kg)     Height 5\' 10"  (1.778 m)     Head Circumference      Peak Flow      Pain Score 8     Pain Loc      Pain Edu?      Excl. in GC?     Updated Vital Signs: BP (!) 84/62   Pulse (!) 155   Temp 98 F (36.7 C)   Resp 19   Ht 5\' 10"  (1.778 m)   Wt 70 kg   SpO2 94%   BMI 22.14 kg/m    General: Awake, moderate distress.  CV:  Irregularly irregular tachycardic rhythm.  Good peripheral  perfusion.  Resp:  Increased effort.  Faint bibasilar rales.  Right chest drain noted. Abd:  Nontender.  No distention.  Other:  Bilateral calves are supple without tenderness.   ED Results / Procedures / Treatments  Labs (all labs ordered are listed, but only abnormal results are displayed) Labs Reviewed  CBC - Abnormal; Notable for the following components:      Result Value   WBC 20.6 (*)    Hemoglobin 12.1 (*)    HCT 38.7 (*)    RDW 20.9 (*)    Platelets 138 (*)    All other components within normal limits  COMPREHENSIVE METABOLIC PANEL - Abnormal; Notable for the following components:   Sodium 134 (*)    Chloride 94 (*)    Glucose, Bld 164 (*)    Creatinine, Ser 0.46 (*)    Calcium 7.6 (*)    Total Protein 5.2 (*)    Albumin 2.1 (*)    Alkaline Phosphatase 199 (*)    All other components within normal limits  BRAIN NATRIURETIC PEPTIDE - Abnormal; Notable for the following components:   B Natriuretic Peptide 462.4 (*)    All other components within normal limits  LACTIC ACID, PLASMA - Abnormal; Notable for the following components:   Lactic Acid, Venous 3.4 (*)    All other components within normal limits  TROPONIN I (HIGH SENSITIVITY) - Abnormal; Notable for the following components:   Troponin I (High Sensitivity) 27 (*)    All other components within normal limits  CULTURE, BLOOD (ROUTINE X 2)  CULTURE, BLOOD (ROUTINE X 2)  RESP PANEL BY RT-PCR (RSV, FLU A&B, COVID)  [id number]  LIPASE, BLOOD  LACTIC ACID, PLASMA  DIFFERENTIAL  TROPONIN I (HIGH SENSITIVITY)     EKG  ED ECG REPORT I, Gracelee Stemmler J, the attending physician, personally viewed and interpreted this ECG.   Date: 06/23/2023  EKG Time: 0424  Rate: 158  Rhythm: atrial fibrillation, rate 158  Axis: Normal  Intervals:none  ST&T Change: Nonspecific    RADIOLOGY I have independently visualized and interpreted patient's chest x-ray as well as noted the radiology interpretation:  Chest x-ray:  Unchanged whiteout of the right chest secondary to pulmonary collapse and pleural fluid  Official radiology report(s): DG Chest 1 View  Result Date: 06/23/2023 CLINICAL DATA:  Chest pain EXAM: CHEST  1 VIEW COMPARISON:  06/13/2023 chest CT FINDINGS: Cliffton Asters out of the right chest, volume loss is present. Reticulonodular opacity throughout the left lung where there is a small pleural effusion. No pneumothorax. Normal heart size. Right-sided porta catheter. IMPRESSION: 1. Unchanged white out of the right chest due to pulmonary collapse and pleural fluid. 2. Unchanged interstitial opacity in the left lung attributed to carcinomatosis and edema based on preceding chest CT. 3. No new abnormality. Electronically Signed   By: Tiburcio Pea M.D.   On: 06/23/2023 04:58     PROCEDURES:  Critical Care performed: Yes, see critical care procedure note(s)  CRITICAL CARE Performed by: Irean Hong   Total critical care time: 45 minutes  Critical care time was exclusive of separately billable procedures and treating other patients.  Critical care was necessary to treat or prevent imminent or life-threatening deterioration.  Critical care was time spent personally by me on the following activities: development of treatment plan with patient and/or surrogate as well as nursing, discussions with consultants, evaluation of patient's response to treatment, examination of patient, obtaining history from patient or surrogate, ordering and performing treatments and interventions, ordering and review of laboratory studies, ordering and review of radiographic studies, pulse oximetry and re-evaluation of patient's condition.   Marland Kitchen1-3 Lead EKG Interpretation  Performed by: Irean Hong, MD Authorized by: Irean Hong, MD     Interpretation: abnormal     ECG rate:  158   ECG rate assessment: tachycardic     Rhythm: atrial fibrillation     Ectopy: none     Conduction: normal   Comments:     Patient placed on  cardiac monitor to evaluate for arrhythmias    MEDICATIONS ORDERED IN ED: Medications  amiodarone (NEXTERONE PREMIX) 360-4.14 MG/200ML-% (1.8 mg/mL) IV infusion (60 mg/hr Intravenous New Bag/Given 06/23/23 0518)  amiodarone (NEXTERONE PREMIX) 360-4.14 MG/200ML-% (1.8 mg/mL) IV infusion (has no administration in time range)  vancomycin (VANCOREADY) IVPB 1750 mg/350 mL (1,750 mg Intravenous New Bag/Given 06/23/23 0601)  docusate sodium (COLACE) capsule 100 mg (has no administration in time range)  polyethylene glycol (MIRALAX / GLYCOLAX) packet 17 g (has no administration in time range)  acetaminophen (TYLENOL) tablet 650 mg (has no administration in time range)  pantoprazole (PROTONIX) EC tablet 40 mg (has no administration in time range)  sodium chloride 0.9 % bolus 500 mL (0 mLs Intravenous Stopped 06/23/23 0500)  diltiazem (CARDIZEM) injection 10 mg (10 mg Intravenous Given 06/23/23 0432)  calcium gluconate 1 g/ 50 mL sodium chloride IVPB (0 mg Intravenous Stopped 06/23/23 0522)  ceFEPIme (MAXIPIME) 2 g in sodium chloride 0.9 % 100 mL IVPB (0 g Intravenous Stopped 06/23/23 0558)  sodium chloride 0.9 % bolus 1,000 mL (1,000 mLs Intravenous New Bag/Given 06/23/23 0521)    And  sodium chloride 0.9 % bolus 1,000 mL (1,000 mLs Intravenous New Bag/Given 06/23/23 0527)  amiodarone (NEXTERONE) 1.5 mg/mL IV bolus only  150 mg (0 mg Intravenous Stopped 06/23/23 0538)     IMPRESSION / MDM / ASSESSMENT AND PLAN / ED COURSE  I reviewed the triage vital signs and the nursing notes.                             61 year old male presenting with chest pain, shortness of breath. Differential includes, but is not limited to, viral syndrome, bronchitis including COPD exacerbation, pneumonia, reactive airway disease including asthma, CHF including exacerbation with or without pulmonary/interstitial edema, pneumothorax, ACS, thoracic trauma, and pulmonary embolism.  Personally reviewed patient's records and note his last  hospitalization from 6/24 - 06/21/2023 for acute on chronic respiratory failure, persistent atrial fibrillation, pleural effusion.  It is noted that patient was placed on amiodarone drip and switch to oral amiodarone on discharge.  Patient's presentation is most consistent with acute presentation with potential threat to life or bodily function.  The patient is on the cardiac monitor to evaluate for evidence of arrhythmia and/or significant heart rate changes.  Will obtain cardiac panel, chest x-ray, administer diltiazem bolus.  Anticipate hospitalization.  Clinical Course as of 06/23/23 0651  Thu Jun 23, 2023  1610 No change with diltiazem bolus.  Currently on infusion.  Heart rate 90s to 140s.  I have just reviewed patient's recent hospitalization.  Will switch to amiodarone drip as last time.  Patient received 1 g calcium gluconate [JS]  0512  and receiving IV fluids. [JS]  [id number] Discussed case with CCU intensivist NP Richardean Chimera who knows the patient from his prior hospitalization.  Advise going ahead and giving the bolus of amiodarone in addition to the infusion.  If heart rate is controlled and blood pressure improves, patient may be admitted to the hospital service.  Otherwise, will be admitted to CCU. [JS]  0538 BP 102/69 HR 114 [JS]  0626 Patient states he is feeling better; heart rate remains in the 120s to 130s, blood pressure 92/63. Lactic acid elevated.  Discussed again with CCU NP who will evaluate patient in the emergency department. [JS]  [id number] Patient to be admitted to the CCU. [JS]    Clinical Course User Index [JS] Irean Hong, MD     FINAL CLINICAL IMPRESSION(S) / ED DIAGNOSES   Final diagnoses:  SOB (shortness of breath)  Atrial fibrillation with rapid ventricular response (HCC)  Hypoxia  Sepsis, due to unspecified organism, unspecified whether acute organ dysfunction present (HCC)  HCAP (healthcare-associated pneumonia)     Rx / DC Orders   ED Discharge Orders      None        Note:  This document was prepared using Dragon voice recognition software and may include unintentional dictation errors.   Irean Hong, MD 06/23/23 [phone]

## 2023-06-23 NOTE — Progress Notes (Signed)
CODE SEPSIS - PHARMACY COMMUNICATION  **Broad Spectrum Antibiotics should be administered within 1 hour of Sepsis diagnosis**  Time Code Sepsis Called/Page Received: 7/4 @ 1610  Antibiotics Ordered: Vanc, Cefepime   Time of 1st antibiotic administration: Cefepime 2 gm IV X 1 given on 7/4 @ 0526  Additional action taken by pharmacy:   If necessary, Name of Provider/Nurse Contacted:     Cecilie Heidel D ,PharmD Clinical Pharmacist  06/23/2023  5:28 AM

## 2023-06-23 NOTE — TOC Initial Note (Addendum)
Transition of Care [facility]) - Initial/Assessment Note    Patient Details  Name: Charles Hickman MRN: [id number] Date of Birth: [birth year]  Transition of Care [facility]) CM/SW Contact:    Kreg Shropshire, RN Phone Number: 06/23/2023, 11:59 AM  Clinical Narrative:                 Cm assessment completed. Readmission prevention screen completed. Pt was recently d/c on 06/21/23. [employer] managed has not made it out to work with [facility] for PT. Wife stated she is extremely tired managing his care at home. Cm stated unfortunately Rosann Auerbach does not have home health aid. She stated she has occasional help at home to help her husband with ADLs. Wife also stated she does not have time to call for different resources in the community. Cm inform wife to reach out to her Oncologist office nurse navigator to find more resources to help. Admited from: Home Caregiver support: Wife [name] DME: Wife confirmed hospital bed, walker, and, BSC PCP: Dorothey Baseman, MD Home Health: d/c on 7/2 and accepted by [facility]. Cm reached out to [facility] Rep to see if they can take him back. Response may be delayed due to 4th of July holiday.  1500- Received message from [facility] rep stating that they are still waiting for [facility] PT. They received his referral on Tuesday during his last admission.  Toc will follow for d/c needs   Barriers to Discharge: Continued Medical Work up   Patient Goals and CMS Choice            Expected Discharge Plan and Services     Post Acute Care Choice: Home Health                                        Prior Living Arrangements/Services   Lives with:: Self, Spouse Patient language and need for interpreter reviewed:: Yes            Current home services: DME    Activities of Daily Living Home Assistive Devices/Equipment: Walker (specify type), Oxygen ADL Screening (condition at time of admission) Patient's cognitive ability adequate to safely complete daily activities?: Yes Is  the patient deaf or have difficulty hearing?: No Does the patient have difficulty seeing, even when wearing glasses/contacts?: No Does the patient have difficulty concentrating, remembering, or making decisions?: No Patient able to express need for assistance with ADLs?: Yes Does the patient have difficulty dressing or bathing?: Yes Independently performs ADLs?: No Communication: Independent Dressing (OT): Needs assistance Is this a change from baseline?: Change from baseline, expected to last >3 days Grooming: Independent Feeding: Independent Bathing: Needs assistance Is this a change from baseline?: Change from baseline, expected to last >3 days Toileting: Needs assistance Is this a change from baseline?: Change from baseline, expected to last >3days In/Out Bed: Needs assistance Is this a change from baseline?: Change from baseline, expected to last >3 days Walks in Home: Needs assistance Is this a change from baseline?: Change from baseline, expected to last >3 days Does the patient have difficulty walking or climbing stairs?: Yes Weakness of Legs: Both Weakness of Arms/Hands: Both  Permission Sought/Granted      Share Information with NAME: Ami Kumagai Spouse [phone]           Emotional Assessment Appearance:: Appears stated age   Affect (typically observed): Calm Orientation: : Oriented to Self, Oriented to Place, Oriented  to  Time   Psych Involvement: No (comment)  Admission diagnosis:  SOB (shortness of breath) [R06.02] Hypoxia [R09.02] Atrial fibrillation with rapid ventricular response (HCC) [I48.91] Atrial fibrillation with RVR (HCC) [I48.91] HCAP (healthcare-associated pneumonia) [J18.9] Sepsis, due to unspecified organism, unspecified whether acute organ dysfunction present [facility]) [A41.9] Patient Active Problem List   Diagnosis Date Noted   Atrial fibrillation with RVR (HCC) 06/23/2023   SOB (shortness of breath) 06/23/2023   Sepsis (HCC) 06/23/2023    Primary malignant neoplasm of lung metastatic to other site (HCC) 06/23/2023   Protein-calorie malnutrition, severe 06/15/2023   Palliative care encounter 06/14/2023   Acute on chronic respiratory failure with hypoxemia (HCC) 06/14/2023   Persistent atrial fibrillation (HCC) 06/14/2023   Pleural effusion on right 06/14/2023   Acute and chronic respiratory failure with hypoxia (HCC) 06/13/2023   Cancer, metastatic to bone (HCC) 06/03/2023   Port-A-Cath in place 06/03/2023   Anxiety associated with cancer diagnosis (HCC) 06/03/2023   Pain due to malignant neoplasm metastatic to bone (HCC) 06/03/2023   Pathological fracture of left hip due to neoplastic disease (HCC) 05/19/2023   Atrial fibrillation with rapid ventricular response (HCC) 05/09/2023   Adenocarcinoma, lung, right (HCC) 04/28/2023   Paroxysmal atrial fibrillation (HCC) 02/16/2023   Palpitations 12/29/2020   Allergic rhinitis 02/16/2019   Fatty liver 03/06/2018   History of hepatitis C 03/06/2018   Hypocalcemia 01/06/2018   Chronic low back pain 01/06/2018   CAD (coronary artery disease), native coronary artery 12/29/2017   Systolic dysfunction 12/16/2017   Nicotine dependence, cigarettes, w unsp disorders 12/16/2017   HLD (hyperlipidemia) 12/16/2017   Essential hypertension 10/03/2017   Diabetes (HCC) 10/03/2017   History of lymphoma 10/03/2017   Diffuse large B cell lymphoma (HCC) 06/16/2015   PCP:  Dorothey Baseman, MD Pharmacy:   [facility] [phone] [place], [place] - [address] [place] [address] Phone: [phone] Fax: [phone]  [facility] [address] [place] - [address] [place] [address] Phone: [phone] Fax: [phone]  [facility] - [place], [place] - [address] [place] [address] Phone: [phone] Fax: [phone]     Social Determinants of Health (SDOH) Social History: SDOH Screenings    Food Insecurity: No Food Insecurity (06/23/2023)  Housing: Low Risk  (06/23/2023)  Transportation Needs: No Transportation Needs (06/23/2023)  Utilities: Not At Risk (06/23/2023)  Depression (PHQ2-9): Low Risk  (12/29/2020)  Financial Resource Strain: Low Risk  (04/06/2023)  Tobacco Use: Medium Risk (06/23/2023)   SDOH Interventions:     Readmission Risk Interventions    06/23/2023   11:50 AM  Readmission Risk Prevention Plan  Transportation Screening Complete  PCP or Specialist Appt within 3-5 Days Complete  HRI or Home Care Consult Complete  Social Work Consult for Recovery Care Planning/Counseling Complete  Palliative Care Screening Complete  Medication Review Oceanographer) Referral to Pharmacy

## 2023-06-24 ENCOUNTER — Inpatient Hospital Stay: Payer: Managed Care, Other (non HMO)

## 2023-06-24 ENCOUNTER — Ambulatory Visit: Payer: Managed Care, Other (non HMO)

## 2023-06-24 ENCOUNTER — Inpatient Hospital Stay: Payer: Managed Care, Other (non HMO) | Admitting: Nurse Practitioner

## 2023-06-24 DIAGNOSIS — I4891 Unspecified atrial fibrillation: Secondary | ICD-10-CM

## 2023-06-24 LAB — HEPATIC FUNCTION PANEL
ALT: 13 U/L (ref 0–44)
AST: 17 U/L (ref 15–41)
Albumin: 2.3 g/dL — ABNORMAL LOW (ref 3.5–5.0)
Alkaline Phosphatase: 183 U/L — ABNORMAL HIGH (ref 38–126)
Bilirubin, Direct: 0.1 mg/dL (ref 0.0–0.2)
Indirect Bilirubin: 0.7 mg/dL (ref 0.3–0.9)
Total Bilirubin: 0.8 mg/dL (ref 0.3–1.2)
Total Protein: 5.6 g/dL — ABNORMAL LOW (ref 6.5–8.1)

## 2023-06-24 LAB — MAGNESIUM: Magnesium: 1.8 mg/dL (ref 1.7–2.4)

## 2023-06-24 LAB — BASIC METABOLIC PANEL
Anion gap: 7 (ref 5–15)
BUN: 8 mg/dL (ref 8–23)
CO2: 29 mmol/L (ref 22–32)
Calcium: 7.7 mg/dL — ABNORMAL LOW (ref 8.9–10.3)
Chloride: 100 mmol/L (ref 98–111)
Creatinine, Ser: 0.36 mg/dL — ABNORMAL LOW (ref 0.61–1.24)
GFR, Estimated: >60 mL/min (ref >60)
Glucose, Bld: 181 mg/dL — ABNORMAL HIGH (ref 70–99)
Potassium: 3.2 mmol/L — ABNORMAL LOW (ref 3.5–5.1)
Sodium: 136 mmol/L (ref 135–145)

## 2023-06-24 LAB — CBC
HCT: 33.9 % — ABNORMAL LOW (ref 39.0–52.0)
Hemoglobin: 10.7 g/dL — ABNORMAL LOW (ref 13.0–17.0)
MCH: 28.6 pg (ref 26.0–34.0)
MCHC: 31.6 g/dL (ref 30.0–36.0)
MCV: 90.6 fL (ref 80.0–100.0)
Platelets: 151 10*3/uL (ref 150–400)
RBC: 3.74 MIL/uL — ABNORMAL LOW (ref 4.22–5.81)
RDW: 21.2 % — ABNORMAL HIGH (ref 11.5–15.5)
WBC: 26.3 10*3/uL — ABNORMAL HIGH (ref 4.0–10.5)
nRBC: 0 % (ref 0.0–0.2)

## 2023-06-24 LAB — PHOSPHORUS: Phosphorus: 3.1 mg/dL (ref 2.5–4.6)

## 2023-06-24 LAB — CULTURE, BLOOD (ROUTINE X 2): Special Requests: ADEQUATE

## 2023-06-24 LAB — GLUCOSE, CAPILLARY
Glucose-Capillary: 169 mg/dL — ABNORMAL HIGH (ref 70–99)
Glucose-Capillary: 161 mg/dL — ABNORMAL HIGH (ref 70–99)
Glucose-Capillary: 177 mg/dL — ABNORMAL HIGH (ref 70–99)

## 2023-06-24 LAB — PROCALCITONIN: Procalcitonin: 0.15 ng/mL

## 2023-06-24 MED ORDER — ACETYLCYSTEINE 20 % IN SOLN
3.0000 mL | Freq: Two times a day (BID) | RESPIRATORY_TRACT | Status: DC
  Administered 2023-06-24 – 2023-06-25 (×2): 3 mL via RESPIRATORY_TRACT
  Filled 2023-06-24 (×2): qty 4

## 2023-06-24 MED ORDER — ACETYLCYSTEINE 20 % IN SOLN: 3.0000 mL | Freq: Three times a day (TID) | RESPIRATORY_TRACT | Status: DC

## 2023-06-24 MED ORDER — VITAMIN D (ERGOCALCIFEROL) 1.25 MG (50000 UNIT) PO CAPS
50000.0000 [IU] | ORAL_CAPSULE | ORAL | Status: DC
  Administered 2023-06-24: 50000 [IU] via ORAL
  Filled 2023-06-24: qty 1

## 2023-06-24 MED ORDER — POTASSIUM CHLORIDE 10 MEQ/50ML IV SOLN
10.0000 meq | INTRAVENOUS | Status: AC
  Administered 2023-06-24 (×4): 10 meq via INTRAVENOUS
  Filled 2023-06-24 (×4): qty 50

## 2023-06-24 MED ORDER — GUAIFENESIN ER 600 MG PO TB12
600.0000 mg | ORAL_TABLET | Freq: Two times a day (BID) | ORAL | Status: DC
  Administered 2023-06-24 – 2023-06-25 (×2): 600 mg via ORAL
  Filled 2023-06-24 (×2): qty 1

## 2023-06-24 MED ORDER — POTASSIUM CHLORIDE CRYS ER 20 MEQ PO TBCR
40.0000 meq | EXTENDED_RELEASE_TABLET | Freq: Once | ORAL | Status: AC
  Administered 2023-06-24: 40 meq via ORAL
  Filled 2023-06-24: qty 2

## 2023-06-24 MED ORDER — MIDODRINE HCL 5 MG PO TABS: 5.0000 mg | ORAL_TABLET | Freq: Three times a day (TID) | ORAL | Status: DC | PRN

## 2023-06-24 MED ORDER — INSULIN ASPART 100 UNIT/ML IJ SOLN
0.0000 [IU] | Freq: Three times a day (TID) | INTRAMUSCULAR | Status: DC
  Administered 2023-06-25: 3 [IU] via SUBCUTANEOUS
  Filled 2023-06-24: qty 1

## 2023-06-24 MED ORDER — MORPHINE SULFATE (PF) 2 MG/ML IV SOLN
2.0000 mg | Freq: Four times a day (QID) | INTRAVENOUS | Status: DC | PRN
  Administered 2023-06-24 – 2023-06-25 (×2): 2 mg via INTRAVENOUS
  Filled 2023-06-24 (×2): qty 1

## 2023-06-24 MED ORDER — ONDANSETRON HCL 4 MG/2ML IJ SOLN
4.0000 mg | Freq: Four times a day (QID) | INTRAMUSCULAR | Status: DC | PRN
  Administered 2023-06-24: 4 mg via INTRAVENOUS
  Filled 2023-06-24: qty 2

## 2023-06-24 MED ORDER — FENTANYL 25 MCG/HR TD PT72: 1.0000 | MEDICATED_PATCH | TRANSDERMAL | Status: DC

## 2023-06-24 MED ORDER — FENTANYL 25 MCG/HR TD PT72
1.0000 | MEDICATED_PATCH | TRANSDERMAL | Status: DC
  Administered 2023-06-24: 1 via TRANSDERMAL
  Filled 2023-06-24: qty 1

## 2023-06-24 MED ORDER — GUAIFENESIN-DM 100-10 MG/5ML PO SYRP
10.0000 mL | ORAL_SOLUTION | Freq: Four times a day (QID) | ORAL | Status: DC | PRN
  Administered 2023-06-24: 10 mL via ORAL
  Filled 2023-06-24: qty 10

## 2023-06-24 MED ORDER — MAGNESIUM SULFATE 2 GM/50ML IV SOLN
2.0000 g | Freq: Once | INTRAVENOUS | Status: AC
  Administered 2023-06-24: 2 g via INTRAVENOUS
  Filled 2023-06-24: qty 50

## 2023-06-24 NOTE — Progress Notes (Signed)
[facility] Progress Note  Patient: Charles Hickman    ZOX:[id number]  DOA: 06/23/2023     Date of Service: the patient was seen and examined on 06/24/2023  Chief Complaint  Patient presents with   Chest Pain   Brief hospital course: 62 y.o. 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 Pt also had constipation for few days and dysuria.  No flank pain.   Of note, he was recently admitted to [facility] from 06/13/23 through 06/20/23 for treatment of Acute on Chronic Hypoxic Respiratory Failure due to chronic right lung atelectasis with recurrent pleural effusion and stage IV adenocarcinoma lung with skeletal metastasis  and presumed pneumonia, along with Atrial fibrillation w/ RVR requiring Amiodarone.  He was followed by Cardiology during the admission, was discharged home on oral Amiodarone 200 mg BID with follow up planned with Cardiology.    ED Course: Initial Vital Signs: Temperature 98 F, Pulse 155, RR 19, BP 86/62, SpO2 94% Significant Labs: Sodium 134, glucose 164, BUN 10, Creatinine 0.46, Alkaline phosphatase 199, BNP 462, HS Troponin 27, lactic acid 3.4, WBC 20.6, Hgb 12.1, Hct 38.7, platelets 138 COVID PCR negative Imaging Chest X-ray>>IMPRESSION: 1. Unchanged white out of the right chest due to pulmonary collapse and pleural fluid. 2. Unchanged interstitial opacity in the left lung attributed to carcinomatosis and edema based on preceding chest CT. 3. No new abnormality. Medications Administered: 2.5 L of IV fluids, cefepime & Vancomycin, 10 mg Cardizem   Following Cardizem administration BP became very soft (92/63).  He  was then given Amiodarone bolus and placed on infusion.  PCCM asked to admit for further workup and treatment.  Please see "significant hospital events" section below for full detailed hospital course.  7/4: Presented to ED with chest tightness and SOB. Found to be in A.fib w/ RVR, became hypotensive following cardizem, placed on Amiodarone.  PCCM asked to admit.  Following ICU arrival to ICU, has since converted back to NSR and BP stable not requiring vasopressors.   Assessment and Plan: #Atrial Fibrillation w/ RVR ~ CONVERTED TO NSR #Transient hypotension: Cardiogenic shock exacerbated by Cardizem administration ~ RESOLVED #Mildly elevated Troponin due to demand ischemia #Chronic HFpEF without acute exacerbation PMHx: A.fib on Eliquis, HFpEF, CAD, Hypertension  Echocardiogram 01/29/21: LVEF 55-60%, Grade I DD, RV systolic function normal  (Echo performed 06/13/23 was of poor quality and non diagnostic) -Continuous cardiac monitoring -Maintain MAP >65 -Cautious IV fluids -Continue outpatient Midodrine prn -Trended lactic acid 1.3 wnl -HS Troponin peaked at 27 -TSH normal 0.8 on 06/14/23 -Continue outpatient Eliquis for anticoagulation -Consider repeat Echocardiogram & Cardiology consultation   #Concern for Sepsis of unknown etiology, currently no obvious source of infection (Meets SIRS Criteria on presentation: HR >90, WBC 20.6, lactic 3.4) Treated for suspected Pneumonia on previous recent admission Does complain of mild dysuria (no flank pain) and lower quadrant abdominal tenderness -Monitor fever curve -Trend WBC's & Procalcitonin -Follow cultures as above -Continue empiric Cefepime  S/p Vancomycin, MRSA prc negative, Vanc d/c'd  F/u cultures & sensitivities -CXR without new infiltrate or abnormality, no reports of change in sputum -Obtain UA as he does complain of some mild dysuria   #Acute  on Chronic Hypoxic Respiratory Failure due to Right Lung Atelectasis, recurrent right  pleural effusion, Stage IV Adenocarcinoma #COPD without acute exacerbation PMHx: COPD on supplemental O2 at baseline, former smoker Has chronic Pleurx catheter in place for approximately 3 to 4 weeks -Supplemental O2 as needed to maintain O2 sats 88 to 92% -BiPAP if needed, wean as tolerated -Follow intermittent Chest X-ray & ABG as needed -Bronchodilators  -Diuresis as BP and renal function permits -Daily Drainage of pleural effusions via Pleurx catheter  -Pulmonary toilet as able Started Mucinex extremity gram p.o. twice daily, Robitussin DM as needed Started Mucomyst twice daily nebulizer treatment, chest PT due to right lung collapse.   #Elevated Alkaline Phophatase -RUQ [place] Negative for gallstones or biliary dilatation.    #Mild Hyponatremia  -Monitor I&O's / urinary output -Follow BMP -Ensure adequate renal perfusion -Avoid nephrotoxic agents as able -Replace electrolytes as indicated ~ Pharmacy following for assistance with electrolyte replacement -Received 2.5 L IV fluids in ED   #Anemia, suspect due to chemotherapy #Thrombocytopenia, suspect due to chemotherapy -Monitor for S/Sx of bleeding -Trend CBC -Eliquis for Anticoagulation/VTE Prophylaxis  -Transfuse for Hgb <7   #Diabetes Mellitus Type II -CBG's ac & hs; Target range of 140 to 180 -SSI -Follow ICU Hypo/Hyperglycemia protocol   #Stage IV Lung Adenocarcinoma with mets to bone -Follow up with Oncology outpatient   Hypokalemia, potassium repleted.   Body mass index is 21.73 kg/m.  Nutrition Problem: Severe Malnutrition Etiology: cancer and cancer related treatments Interventions:  Pressure Injury 06/14/23 Coccyx Right;Left;Medial Stage 2 -  Partial thickness loss of dermis presenting as a shallow open injury with a red, pink wound bed without slough. Red open area with skin slogging (Active)  06/14/23 1745  Location: Coccyx  Location Orientation: Right;Left;Medial  Staging: Stage 2 -  Partial  thickness loss of dermis presenting as a shallow open injury with a red, pink wound bed without slough.  Wound Description (Comments): Red open area with skin slogging  Present on Admission: Yes  Dressing Type Foam - Lift dressing to assess site every shift 06/23/23 0800     Diet: Carb modified diet DVT Prophylaxis: Therapeutic Anticoagulation with Eliquis    Advance goals of care discussion: Full code  Family Communication: family was not present at bedside, at the time of interview.  The pt provided permission to discuss medical plan with the family. Opportunity was given to ask question and all questions were answered satisfactorily.   Disposition:  Pt is from Home, admitted with A-fib with RVR, hypotension, respiratory failure, found to have right lung collapse, still has respiratory failure, which precludes a safe discharge. Discharge to SNF TBD after improvement, may need few more days to stabilize..  Subjective: Patient was admitted to the ICU for shortness of breath and A-fib RVR.  Stabilized and downgraded under TRH service.  Patient still has significant shortness of breath, denies any chest pain or palpitations. No abdominal pain. Overall prognosis is poor due to right lung collapse, CODE STATUS discussed but patient would like to be full code for now until he discussed with his wife.   Physical Exam: General: NAD, lying comfortably Appear in no distress, affect appropriate Eyes: PERRLA ENT: Oral Mucosa Clear, moist  Neck: no JVD,  Cardiovascular: S1 and S2 Present, no Murmur,  Respiratory: Decreased air entry on the right side due to lung collapse, mild crackles on the left, no wheezing appreciated Abdomen: Bowel Sound present, Soft and no tenderness,  Skin: no rashes Extremities: mild Pedal  edema, no calf tenderness, venous stasis pigmentation Neurologic: without any new focal findings Gait not checked due to patient safety concerns  Vitals:   06/24/23 1300 06/24/23  1400 06/24/23 1500 06/24/23 1600  BP: 118/73 117/72 (!) 165/100 126/81  Pulse: 89 85 92 91  Resp: 15 14 (!) 30 18  Temp:    98 F (36.7 C)  TempSrc:    Oral  SpO2: 96% 96% 90% 91%  Weight:      Height:        Intake/Output Summary (Last 24 hours) at 06/24/2023 1724 Last data filed at 06/24/2023 1600 Gross per 24 hour  Intake 1340.83 ml  Output 2850 ml  Net -1509.17 ml   Filed Weights   06/23/23 0422 06/23/23 0800 06/24/23 0500  Weight: 70 kg 67.7 kg 68.7 kg    Data Reviewed: I have personally reviewed and interpreted daily labs, tele strips, imagings as discussed above. I reviewed all nursing notes, pharmacy notes, vitals, pertinent old records I have discussed plan of care as described above with RN and patient/family.  CBC: Recent Labs  Lab 06/18/23 0625 06/19/23 0700 06/23/23 0426 06/23/23 0856 06/24/23 0453  WBC 16.3* 17.0* 20.6* 23.6* 26.3*  NEUTROABS  --   --   --  21.2*  --   HGB 12.7* 12.4* 12.1* 10.2* 10.7*  HCT 39.9 37.9* 38.7* 32.8* 33.9*  MCV 89.1 87.1 90.6 91.4 90.6  PLT 100* 122* 138* 142* 151   Basic Metabolic Panel: Recent Labs  Lab 06/18/23 0625 06/19/23 0700 06/23/23 0426 06/24/23 0453  NA 136 132* 134* 136  K 5.0 3.4* 3.5 3.2*  CL 97* 95* 94* 100  CO2 31 31 26 29   GLUCOSE 121* 91 164* 181*  BUN 7* 9 10 8   CREATININE 0.37* <0.30* 0.46* 0.36*  CALCIUM 8.5* 7.9* 7.6* 7.7*  MG  --   --   --  1.8  PHOS 1.8* 2.5  --  3.1    Studies: No results found.  Scheduled Meds:  acetylcysteine  3 mL Nebulization BID   apixaban  5 mg Oral BID   vitamin C  500 mg Oral BID   Chlorhexidine Gluconate Cloth  6 each Topical Daily   docusate sodium  100 mg Oral BID   feeding supplement  1 Container Oral TID BM   fentaNYL  1 patch Transdermal Q72H   guaiFENesin  600 mg Oral BID   insulin aspart  0-15 Units Subcutaneous TID WC   ipratropium  0.5 mg Nebulization BID   levalbuterol  1.25 mg Nebulization BID   mirtazapine  7.5 mg Oral QHS   multivitamin   15 mL Oral Daily   pantoprazole  40 mg Oral Daily   polyethylene glycol  17 g Oral Daily   rosuvastatin  10 mg Oral Daily   sodium chloride flush  10-40 mL Intracatheter Q12H   Vitamin D (Ergocalciferol)  50,000 Units Oral Q7 days   Continuous Infusions:  amiodarone 30 mg/hr (06/24/23 1600)   ceFEPime (MAXIPIME) IV 200 mL/hr at 06/24/23 1600   PRN Meds: acetaminophen, ALPRAZolam, docusate sodium, guaiFENesin-dextromethorphan, midodrine, morphine injection, ondansetron (ZOFRAN) IV, mouth rinse, oxyCODONE, polyethylene glycol, sodium chloride flush  Time spent: 55 minutes  Author: Gillis Santa. MD [facility] 06/24/2023 5:24 PM  To reach On-call, see care teams to locate the attending and reach out to them via [email or web address]. If 7PM-7AM, please contact night-coverage If you still have difficulty reaching the attending provider, please page the [facility] (Director on Call) for  [facility] on amion for assistance.

## 2023-06-24 NOTE — Plan of Care (Signed)
Continuing with plan of care. 

## 2023-06-24 NOTE — Progress Notes (Signed)
PHARMACY CONSULT NOTE  Pharmacy Consult for Electrolyte Monitoring and Replacement   Recent Labs: Potassium (mmol/L)  Date Value  06/24/2023 3.2 (L)  10/25/2013 4.1   Magnesium (mg/dL)  Date Value  16/09/9603 1.8   Calcium (mg/dL)  Date Value  54/08/8118 7.7 (L)   Calcium, Total (mg/dL)  Date Value  14/78/2956 9.2   Albumin (g/dL)  Date Value  21/30/8657 2.3 (L)  12/07/2017 4.5  10/25/2013 4.2   Phosphorus (mg/dL)  Date Value  84/69/6295 3.1   Sodium (mmol/L)  Date Value  06/24/2023 136  12/11/2020 140  10/25/2013 138     Assessment: 62 y/o male with metastatic stage IV lung cancer s/p chemotherapy, large cell lymphoma, mucositis, COPD, malignant pleural effusions requiring pleurx catheter, HLD, CAD s/p heart cath, HCV, HTN, s/p Left hip hemiarthroplasty (05/2023), Afib and DM who is admitted with Afib, hypotension and concerns for sepsis.   Goal of Therapy:  Potassium 4.0 - 5.1 mmol/L Magnesium 2.0 - 2.4 mg/dL All Other Electrolytes WNL  Plan:  ---2 grams IV magnesium sulfate x 1 ---40 mEq po kcl x 1 per NP ---10 meq IV Kcl x 4 per NP ---recheck electrolytes in am  Lowella Bandy ,PharmD Clinical Pharmacist 06/24/2023 7:10 AM

## 2023-06-25 ENCOUNTER — Inpatient Hospital Stay: Payer: Managed Care, Other (non HMO)

## 2023-06-25 DIAGNOSIS — Z7189 Other specified counseling: Secondary | ICD-10-CM

## 2023-06-25 DIAGNOSIS — Z515 Encounter for palliative care: Secondary | ICD-10-CM

## 2023-06-25 DIAGNOSIS — R0902 Hypoxemia: Secondary | ICD-10-CM

## 2023-06-25 DIAGNOSIS — I4891 Unspecified atrial fibrillation: Secondary | ICD-10-CM | POA: Diagnosis not present

## 2023-06-25 DIAGNOSIS — J189 Pneumonia, unspecified organism: Secondary | ICD-10-CM

## 2023-06-25 LAB — BASIC METABOLIC PANEL
Anion gap: 8 (ref 5–15)
BUN: 9 mg/dL (ref 8–23)
CO2: 29 mmol/L (ref 22–32)
Calcium: 7.9 mg/dL — ABNORMAL LOW (ref 8.9–10.3)
Chloride: 99 mmol/L (ref 98–111)
Creatinine, Ser: 0.37 mg/dL — ABNORMAL LOW (ref 0.61–1.24)
GFR, Estimated: >60 mL/min (ref >60)
Glucose, Bld: 172 mg/dL — ABNORMAL HIGH (ref 70–99)
Potassium: 4.5 mmol/L (ref 3.5–5.1)
Sodium: 136 mmol/L (ref 135–145)

## 2023-06-25 LAB — PHOSPHORUS: Phosphorus: 2.8 mg/dL (ref 2.5–4.6)

## 2023-06-25 LAB — CBC
HCT: 33.1 % — ABNORMAL LOW (ref 39.0–52.0)
Hemoglobin: 10.1 g/dL — ABNORMAL LOW (ref 13.0–17.0)
MCH: 28.5 pg (ref 26.0–34.0)
MCHC: 30.5 g/dL (ref 30.0–36.0)
MCV: 93.5 fL (ref 80.0–100.0)
Platelets: 127 10*3/uL — ABNORMAL LOW (ref 150–400)
RBC: 3.54 MIL/uL — ABNORMAL LOW (ref 4.22–5.81)
RDW: 21.2 % — ABNORMAL HIGH (ref 11.5–15.5)
WBC: 24.1 10*3/uL — ABNORMAL HIGH (ref 4.0–10.5)
nRBC: 0 % (ref 0.0–0.2)

## 2023-06-25 LAB — HEPATIC FUNCTION PANEL
ALT: 12 U/L (ref 0–44)
AST: 16 U/L (ref 15–41)
Albumin: 2.2 g/dL — ABNORMAL LOW (ref 3.5–5.0)
Alkaline Phosphatase: 181 U/L — ABNORMAL HIGH (ref 38–126)
Bilirubin, Direct: 0.1 mg/dL (ref 0.0–0.2)
Indirect Bilirubin: 0.8 mg/dL (ref 0.3–0.9)
Total Bilirubin: 0.9 mg/dL (ref 0.3–1.2)
Total Protein: 5.5 g/dL — ABNORMAL LOW (ref 6.5–8.1)

## 2023-06-25 LAB — GLUCOSE, CAPILLARY
Glucose-Capillary: 170 mg/dL — ABNORMAL HIGH (ref 70–99)
Glucose-Capillary: 175 mg/dL — ABNORMAL HIGH (ref 70–99)

## 2023-06-25 LAB — MAGNESIUM: Magnesium: 2.1 mg/dL (ref 1.7–2.4)

## 2023-06-25 MED ORDER — BIOTENE DRY MOUTH MT LIQD: 15.0000 mL | OROMUCOSAL | Status: DC | PRN

## 2023-06-25 MED ORDER — MORPHINE SULFATE (PF) 2 MG/ML IV SOLN
2.0000 mg | INTRAVENOUS | Status: DC | PRN
  Administered 2023-06-25 (×3): 2 mg via INTRAVENOUS
  Filled 2023-06-25 (×3): qty 1

## 2023-06-25 MED ORDER — LORAZEPAM 2 MG/ML IJ SOLN
2.0000 mg | INTRAMUSCULAR | Status: DC | PRN
  Administered 2023-06-25: 2 mg via INTRAVENOUS
  Filled 2023-06-25: qty 1

## 2023-06-25 MED ORDER — GLYCOPYRROLATE 1 MG PO TABS: 1.0000 mg | ORAL_TABLET | ORAL | Status: DC | PRN

## 2023-06-25 MED ORDER — ACETAMINOPHEN 650 MG RE SUPP: 650.0000 mg | Freq: Four times a day (QID) | RECTAL | Status: DC | PRN

## 2023-06-25 MED ORDER — ONDANSETRON 4 MG PO TBDP: 4.0000 mg | ORAL_TABLET | Freq: Four times a day (QID) | ORAL | Status: DC | PRN

## 2023-06-25 MED ORDER — HALOPERIDOL LACTATE 2 MG/ML PO CONC: 0.5000 mg | ORAL | Status: DC | PRN

## 2023-06-25 MED ORDER — GLYCOPYRROLATE 0.2 MG/ML IJ SOLN
0.2000 mg | INTRAMUSCULAR | Status: DC | PRN
  Administered 2023-06-25: 0.2 mg via INTRAVENOUS
  Filled 2023-06-25: qty 1

## 2023-06-25 MED ORDER — GLYCOPYRROLATE 0.2 MG/ML IJ SOLN: 0.2000 mg | INTRAMUSCULAR | Status: DC | PRN

## 2023-06-25 MED ORDER — POLYVINYL ALCOHOL 1.4 % OP SOLN: 1.0000 [drp] | Freq: Four times a day (QID) | OPHTHALMIC | Status: DC | PRN

## 2023-06-25 MED ORDER — ONDANSETRON HCL 4 MG/2ML IJ SOLN: 4.0000 mg | Freq: Four times a day (QID) | INTRAMUSCULAR | Status: DC | PRN

## 2023-06-25 MED ORDER — HALOPERIDOL LACTATE 5 MG/ML IJ SOLN
0.5000 mg | INTRAMUSCULAR | Status: DC | PRN
  Filled 2023-06-25: qty 1

## 2023-06-25 MED ORDER — ACETAMINOPHEN 325 MG PO TABS: 650.0000 mg | ORAL_TABLET | Freq: Four times a day (QID) | ORAL | Status: DC | PRN

## 2023-06-25 MED ORDER — HALOPERIDOL 0.5 MG PO TABS: 0.5000 mg | ORAL_TABLET | ORAL | Status: DC | PRN

## 2023-06-25 MED ORDER — LORAZEPAM 2 MG/ML IJ SOLN
INTRAMUSCULAR | Status: AC
  Administered 2023-06-25: 2 mg via INTRAVENOUS
  Filled 2023-06-25: qty 1

## 2023-06-26 LAB — CULTURE, BLOOD (ROUTINE X 2)

## 2023-06-27 ENCOUNTER — Ambulatory Visit: Payer: Managed Care, Other (non HMO)

## 2023-06-28 ENCOUNTER — Ambulatory Visit: Payer: Managed Care, Other (non HMO)

## 2023-06-28 LAB — CULTURE, BLOOD (ROUTINE X 2)
Culture: NO GROWTH
Culture: NO GROWTH

## 2023-06-29 ENCOUNTER — Ambulatory Visit: Payer: Managed Care, Other (non HMO)

## 2023-06-29 ENCOUNTER — Ambulatory Visit: Payer: Managed Care, Other (non HMO) | Admitting: Medical

## 2023-06-30 ENCOUNTER — Inpatient Hospital Stay: Payer: Managed Care, Other (non HMO) | Admitting: Hospice and Palliative Medicine

## 2023-06-30 ENCOUNTER — Inpatient Hospital Stay: Payer: Managed Care, Other (non HMO) | Admitting: Oncology

## 2023-06-30 ENCOUNTER — Ambulatory Visit: Payer: Managed Care, Other (non HMO)

## 2023-07-01 ENCOUNTER — Ambulatory Visit: Payer: Managed Care, Other (non HMO)

## 2023-07-11 ENCOUNTER — Ambulatory Visit: Payer: Managed Care, Other (non HMO) | Admitting: Radiation Oncology

## 2023-07-15 ENCOUNTER — Other Ambulatory Visit: Payer: Managed Care, Other (non HMO)

## 2023-07-15 ENCOUNTER — Ambulatory Visit: Payer: Managed Care, Other (non HMO) | Admitting: Oncology

## 2023-07-15 ENCOUNTER — Ambulatory Visit: Payer: Managed Care, Other (non HMO)

## 2023-07-18 ENCOUNTER — Ambulatory Visit: Payer: Managed Care, Other (non HMO)

## 2023-07-21 NOTE — Progress Notes (Signed)
[facility] Progress Note  Patient: Charles Hickman    ZOX:[id number]  DOA: 06/23/2023     Date of Service: the patient was seen and examined on   Chief Complaint  Patient presents with   Chest Pain   Brief hospital course: 62 y.o. 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 Pt also had constipation for few days and dysuria.  No flank pain.   Of note, he was recently admitted to [facility] from 06/13/23 through 06/20/23 for treatment of Acute on Chronic Hypoxic Respiratory Failure due to chronic right lung atelectasis with recurrent pleural effusion and stage IV adenocarcinoma lung with skeletal metastasis  and presumed pneumonia, along with Atrial fibrillation w/ RVR requiring Amiodarone.  He was followed by Cardiology during the admission, was discharged home on oral Amiodarone 200 mg BID with follow up planned with Cardiology.    ED Course: Initial Vital Signs: Temperature 98 F, Pulse 155, RR 19, BP 86/62, SpO2 94% Significant Labs: Sodium 134, glucose 164, BUN 10, Creatinine 0.46, Alkaline phosphatase 199, BNP 462, HS Troponin 27, lactic acid 3.4, WBC 20.6, Hgb 12.1, Hct 38.7, platelets 138 COVID PCR negative Imaging Chest X-ray>>IMPRESSION: 1. Unchanged white out of the right chest due to pulmonary collapse and pleural fluid. 2. Unchanged interstitial opacity in the left lung attributed to carcinomatosis and edema based on preceding chest CT. 3. No new abnormality. Medications Administered: 2.5 L of IV fluids, cefepime & Vancomycin, 10 mg Cardizem   Following Cardizem administration BP became very soft (92/63).  He  was then given Amiodarone bolus and placed on infusion.  PCCM asked to admit for further workup and treatment.  Please see "significant hospital events" section below for full detailed hospital course.  7/4: Presented to ED with chest tightness and SOB. Found to be in A.fib w/ RVR, became hypotensive following cardizem, placed on Amiodarone.  PCCM asked to admit.  Following ICU arrival to ICU, has since converted back to NSR and BP stable not requiring vasopressors.  7/5 patient was downgraded under TRH  On 7/6 patient's condition deteriorated, hypoxic oxygen saturation was in 70s and 80s, patient was started on HFNC.  Discussed with patient's son at bedside that his father's condition is deteriorating and may need to be intubated, and it will be difficult to wean him off due to underlying illness.  Patient's son agreed to change CODE STATUS to DNR/DNI. Palliative care followed up and discussed with family.  Due to poor prognosis family decided for comfort measures only.  Comfort care orders were placed.  RN may pronounce death We will keep patient comfortable and await to pass away during hospital stay, may not be stable to transfer to hospice.   Assessment and Plan:  # Comfort measures only, changed status on 7/6 Continue comfort measures and follow-up palliative care  #Atrial Fibrillation w/ RVR ~ CONVERTED TO NSR #Transient hypotension: Cardiogenic shock exacerbated by Cardizem administration ~ RESOLVED #Mildly elevated Troponin due to demand ischemia #Chronic HFpEF without acute exacerbation PMHx: A.fib on Eliquis, HFpEF, CAD, Hypertension  Echocardiogram 01/29/21: LVEF 55-60%, Grade I DD, RV systolic function normal  (Echo performed 06/13/23 was of poor quality and non diagnostic), s/p  Cardiac monitoring, IVF and midodrine. lactic acid 1.3 wnl, HS Troponin peaked at 27, TSH normal 0.8 on 06/14/23. S/p Eliquis  # Concern for Sepsis of unknown etiology, currently no obvious source of  infection (Meets SIRS Criteria on presentation: HR >90, WBC 20.6, lactic 3.4) Treated for suspected Pneumonia on previous recent admission Did complain of mild dysuria (no flank pain) and lower quadrant abdominal tenderness cultures negative.s/p empiric Cefepime. S/p Vancomycin, MRSA prc negative, Vanc d/c'd. CXR without new infiltrate or abnormality, no reports of change in sputum.  UA negative #Acute on Chronic Hypoxic Respiratory Failure due to Right Lung Atelectasis, recurrent right pleural effusion, Stage IV Adenocarcinoma #COPD without acute exacerbation PMHx: COPD on supplemental O2 at baseline, former smoker Has chronic Pleurx catheter in place for approximately 3 to 4 weeks S/p Mucomyst nebulizers, Mucinex twice daily and Robitussin DM as needed S/p BiPAP as needed, continue supplemental O2 admission for comfort Continue suction as needed and continue comfort measures # Elevated Alkaline Phophatase. RUQ [place] Negative for gallstones or biliary dilatation.  # Mild Hyponatremia, resolved s/p IVF   # Anemia, suspect due to chemotherapy # Thrombocytopenia, suspect due to chemotherapy.  Remained stable #Diabetes Mellitus Type II, s/p NovoLog sliding scale and CBG monitoring #Stage IV Lung Adenocarcinoma with mets to bone. Pt was following with Oncology outpatient # Hypokalemia, potassium repleted. Resolved    Body mass index is 21.26 kg/m.  Nutrition Problem: Severe Malnutrition Etiology: cancer and cancer related treatments Interventions:  Pressure Injury 06/14/23 Coccyx Right;Left;Medial Stage 2 -  Partial thickness loss of dermis presenting as a shallow open injury with a red, pink wound bed without slough. Red open area with skin slogging (Active)  06/14/23 1745  Location: Coccyx  Location Orientation: Right;Left;Medial  Staging: Stage 2 -  Partial thickness loss of dermis presenting as a shallow open injury with a red, pink wound bed without slough.  Wound Description (Comments):  Red open area with skin slogging  Present on Admission: Yes  Dressing Type Foam - Lift dressing to assess site every shift  0800     Diet: Carb modified diet DVT Prophylaxis: Therapeutic Anticoagulation with Eliquis    Advance goals of care discussion: Full code  Family Communication: family was not present at bedside, at the time of interview.  The pt provided permission to discuss medical plan with the family. Opportunity was given to ask question and all questions were answered satisfactorily.   Disposition:  Pt is from Home, admitted with A-fib with RVR, hypotension, respiratory failure, found to have right lung collapse, still has respiratory failure, which precludes a safe discharge. Discharge to SNF TBD after improvement, may need few more days to stabilize..  Subjective: Patient was admitted to the ICU for shortness of breath and A-fib RVR.  Stabilized and downgraded under TRH service.  Patient still has significant shortness of breath, denies any chest pain or palpitations. No abdominal pain. Overall prognosis is poor due to right lung collapse, CODE STATUS discussed but patient would like to be full code for now until he discussed with his wife.   Physical Exam: General: NAD, lying comfortably Appear in no distress, affect appropriate Eyes: PERRLA ENT: Oral Mucosa Clear, moist  Neck: no JVD,  Cardiovascular: S1 and S2 Present, no Murmur,  Respiratory: Decreased air entry on the right side due to lung collapse, mild crackles on the left, no wheezing appreciated Abdomen: Bowel Sound present, Soft and no tenderness,  Skin: no rashes Extremities: mild Pedal edema, no calf tenderness,  venous stasis pigmentation Neurologic: without any new focal findings Gait not checked due to patient safety concerns  Vitals:    1100  1200  1230  1300  BP: 114/79     Pulse: 90 100 96 95  Resp: 11 (!) 25 19 18   Temp:      TempSrc:      SpO2: 98%  98% 90% 91%  Weight:      Height:        Intake/Output Summary (Last 24 hours) at  1405 Last data filed at  1300 Gross per 24 hour  Intake 705.8 ml  Output 1500 ml  Net -794.2 ml   Filed Weights   06/23/23 0800 06/24/23 0500  0500  Weight: 67.7 kg 68.7 kg 67.2 kg    Data Reviewed: I have personally reviewed and interpreted daily labs, tele strips, imagings as discussed above. I reviewed all nursing notes, pharmacy notes, vitals, pertinent old records I have discussed plan of care as described above with RN and patient/family.  CBC: Recent Labs  Lab 06/19/23 0700 06/23/23 0426 06/23/23 0856 06/24/23 0453  0527  WBC 17.0* 20.6* 23.6* 26.3* 24.1*  NEUTROABS  --   --  21.2*  --   --   HGB 12.4* 12.1* 10.2* 10.7* 10.1*  HCT 37.9* 38.7* 32.8* 33.9* 33.1*  MCV 87.1 90.6 91.4 90.6 93.5  PLT 122* 138* 142* 151 127*   Basic Metabolic Panel: Recent Labs  Lab 06/19/23 0700 06/23/23 0426 06/24/23 0453  0527  NA 132* 134* 136 136  K 3.4* 3.5 3.2* 4.5  CL 95* 94* 100 99  CO2 31 26 29 29   GLUCOSE 91 164* 181* 172*  BUN 9 10 8 9   CREATININE <0.30* 0.46* 0.36* 0.37*  CALCIUM 7.9* 7.6* 7.7* 7.9*  MG  --   --  1.8 2.1  PHOS 2.5  --  3.1 2.8    Studies: DG Chest Port 1 View  Result Date:  CLINICAL DATA:  Acute hypoxic respiratory failure. EXAM: PORTABLE CHEST 1 VIEW COMPARISON:  06/23/2023 FINDINGS: There is a right chest wall port a catheter with tip in the superior cavoatrial junction. A tunneled right-sided PleurX catheter is identified with tip overlying the apical portion of the right upper lobe. Unchanged complete opacification of the right hemithorax which as noted on the CT from 06/13/2023 reflects a combination of pleural effusion and complete consolidation of the right lung. Diffuse reticular and nodular interstitial opacities noted throughout the left lung which appear similar to the previous exam. IMPRESSION: 1.  Unchanged complete opacification of the right hemithorax which as noted on the CT from 06/13/2023 reflects a combination of pleural effusion and complete consolidation of the right lung. 2. 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 Electronically Signed   By: Signa Kell M.D.   On:  10:59    Scheduled Meds:  fentaNYL  1 patch Transdermal Q72H   LORazepam       sodium chloride flush  10-40 mL Intracatheter Q12H   Continuous Infusions:  amiodarone 30 mg/hr ( 1300)   PRN Meds: acetaminophen **OR** acetaminophen, antiseptic oral rinse, glycopyrrolate **OR** glycopyrrolate **OR** glycopyrrolate, guaiFENesin-dextromethorphan, haloperidol **OR** haloperidol **OR** haloperidol lactate, LORazepam, LORazepam, morphine injection, ondansetron **OR** ondansetron (ZOFRAN) IV, mouth rinse, polyethylene glycol, polyvinyl alcohol, sodium chloride flush  Time spent: 55 minutes  Author: Gillis Santa. MD [facility]  2:05 PM  To reach On-call, see care  teams to locate the attending and reach out to them via [email or web address]. If 7PM-7AM, please contact night-coverage If you still have difficulty reaching the attending provider, please page the [facility] (Director on Call) for [facility] on amion for assistance.

## 2023-07-21 NOTE — Discharge Summary (Signed)
[facility] Discharge Summary   Patient: Charles Hickman:[id number]  PCP: Dorothey Baseman, MD  Date of admission: 06/23/2023   Date of discharge:       Discharge Diagnoses:  Principal Problem:   Atrial fibrillation with RVR (HCC) Active Problems:   SOB (shortness of breath)   Sepsis (HCC)   Primary malignant neoplasm of lung metastatic to other site [facility])   Admitted From: Home Disposition:  Hospice/medical facility   Recommendations for Outpatient Follow-up:   Hospice Care Follow up LABS/TEST:  none   Diet recommendation: Regular diet  Activity: The patient is advised to gradually reintroduce usual activities, as tolerated  Discharge Condition: stable  Code Status: DNR   History of present illness: As per the H and P dictated on admission Hospital Course:  62 y.o. 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 Pt also had constipation for few days and dysuria.  No flank pain. Of note, he was recently admitted to [facility] from 06/13/23 through 06/20/23 for treatment of Acute on Chronic Hypoxic Respiratory Failure due to chronic right lung atelectasis with recurrent pleural effusion and stage IV adenocarcinoma lung with skeletal metastasis  and presumed pneumonia, along with Atrial fibrillation w/ RVR requiring Amiodarone.  He was followed by Cardiology during the admission, was discharged home on oral Amiodarone 200 mg BID with follow up planned with Cardiology.    ED Course: Initial Vital Signs: Temperature 98 F, Pulse 155, RR 19, BP 86/62, SpO2 94% Significant Labs: Sodium 134, glucose 164, BUN 10,  Creatinine 0.46, Alkaline phosphatase 199, BNP 462, HS Troponin 27, lactic acid 3.4, WBC 20.6, Hgb 12.1, Hct 38.7, platelets 138 COVID PCR negative Imaging Chest X-ray>>IMPRESSION: 1. Unchanged white out of the right chest due to pulmonary collapse and pleural fluid. 2. Unchanged interstitial opacity in the left lung attributed to carcinomatosis and edema based on preceding chest CT. 3. No new abnormality. Medications Administered: 2.5 L of IV fluids, cefepime & Vancomycin, 10 mg Cardizem   Following Cardizem administration BP became very soft (92/63).  He was then given Amiodarone bolus and placed on infusion.  PCCM asked to admit for further workup and treatment.  Please see "significant hospital events" section below for full detailed hospital course.   7/4: Presented to ED with chest tightness and SOB. Found to be in A.fib w/ RVR, became hypotensive following cardizem, placed on Amiodarone.  PCCM asked to admit.  Following ICU arrival to ICU, has since converted back to NSR and BP stable not requiring vasopressors.   7/5 patient was downgraded under TRH   On 7/6 patient's condition deteriorated, hypoxic oxygen saturation was in 70s and 80s, patient was started on HFNC.  Discussed with patient's son at bedside that his father's condition is deteriorating and may need to be intubated, and it will be difficult to wean him off due to underlying illness.  Patient's son agreed to change CODE STATUS to DNR/DNI. Palliative care followed up and discussed with family.  Due to poor prognosis family decided for comfort measures only.  Comfort care orders were placed.  RN may pronounce death Hospice team was consulted and pt got accepted. Discharge today to hospice facility.  Assessment and Plan:   # Comfort measures only, changed status on 7/6 Continue comfort measures and follow-up palliative care   #Atrial Fibrillation w/ RVR ~ CONVERTED TO NSR #Transient hypotension: Cardiogenic shock  exacerbated by Cardizem administration ~ RESOLVED #Mildly elevated Troponin due to demand ischemia #Chronic HFpEF without acute exacerbation PMHx: A.fib on Eliquis, HFpEF, CAD, Hypertension  Echocardiogram 01/29/21: LVEF 55-60%, Grade I DD, RV systolic function normal  (Echo performed 06/13/23 was of poor quality and non diagnostic), s/p Cardiac monitoring, IVF and midodrine. lactic acid 1.3 wnl, HS Troponin peaked at 27, TSH normal 0.8 on 06/14/23. S/p Eliquis  # Concern for Sepsis of unknown etiology, currently no obvious source of infection (Meets SIRS Criteria on presentation: HR >90, WBC 20.6, lactic 3.4) Treated for suspected Pneumonia on previous recent admission Did complain of mild dysuria (no flank pain) and lower quadrant abdominal tenderness cultures negative.s/p empiric Cefepime. S/p Vancomycin, MRSA prc negative, Vanc d/c'd. CXR without new infiltrate or abnormality, no reports of change in sputum.  UA negative #Acute on Chronic Hypoxic Respiratory Failure due to Right Lung Atelectasis, recurrent right pleural effusion, Stage IV Adenocarcinoma #COPD without acute exacerbation PMHx: COPD on supplemental O2 at baseline, former smoker Has chronic Pleurx catheter in place for approximately 3 to 4 weeks S/p Mucomyst nebulizers, Mucinex twice daily and Robitussin DM as needed S/p BiPAP as needed, continue supplemental O2 admission for comfort Continue suction as needed and continue comfort measures # Elevated Alkaline Phophatase. RUQ [place] Negative for gallstones or biliary dilatation.  # Mild Hyponatremia, resolved s/p IVF   # Anemia, suspect due to chemotherapy # Thrombocytopenia, suspect due to chemotherapy.  Remained stable #Diabetes Mellitus Type II, s/p NovoLog sliding scale and CBG monitoring #Stage IV Lung Adenocarcinoma with mets to bone. Pt was following with Oncology outpatient # Hypokalemia, potassium repleted. Resolved   Body mass index is 21.26 kg/m.  Nutrition Problem:  Severe Malnutrition Etiology: cancer and cancer related treatments Nutrition Interventions:  Pressure Injury 06/14/23 Coccyx Right;Left;Medial Stage 2 -  Partial thickness loss of dermis presenting as a shallow open injury with a red, pink wound bed without slough. Red open area with skin slogging (Active)  06/14/23 1745  Location: Coccyx  Location Orientation: Right;Left;Medial  Staging: Stage 2 -  Partial thickness loss of dermis presenting as a shallow open injury with a red, pink wound bed without slough.  Wound Description (Comments): Red open area with skin slogging  Present on Admission: Yes  Dressing Type Foam - Lift dressing to assess site every shift  0800     Consultants: PCCM  Procedures: None  Discharge Exam: General: Mild Resp distress, Oral Mucosa Clear, dry. Cardiovascular: S1 and S2 Present, no Murmur, Respiratory: SOB, decreased Breath sounds on Right, Crackles on left and mild wheezes Abdomen: Bowel Sound present, Soft and no tenderness, no hernia Extremities: no Pedal edema, no calf tenderness Neurology: very lethargic   Filed Weights   06/23/23 0800 06/24/23 0500  0500  Weight: 67.7 kg 68.7 kg 67.2 kg   Vitals:    1230  1300  BP:    Pulse: 96 95  Resp: 19 18  Temp:    SpO2: 90% 91%    DISCHARGE MEDICATION: Allergies as of    No Known Allergies      Medication List     STOP taking these medications    acetaminophen 500 MG tablet Commonly known as: TYLENOL   amiodarone 200 MG tablet Commonly known as: PACERONE   cholecalciferol 25  MCG (1000 UNIT) tablet Commonly known as: VITAMIN D3   [employer] Calcium 600 MG tablet Generic drug: calcium carbonate   Eliquis 5 MG Tabs tablet Generic drug: apixaban   ipratropium 0.02 % nebulizer solution Commonly known as: ATROVENT   Jardiance 25 MG Tabs tablet Generic drug: empagliflozin   levalbuterol 1.25 MG/0.5ML nebulizer solution Commonly known as:  XOPENEX   lidocaine-prilocaine cream Commonly known as: EMLA   metoprolol succinate 25 MG 24 hr tablet Commonly known as: TOPROL-XL   midodrine 5 MG tablet Commonly known as: PROAMATINE   mirtazapine 7.5 MG tablet Commonly known as: REMERON   Multi-Vitamin tablet   naloxone 4 MG/0.1ML Liqd nasal spray kit Commonly known as: NARCAN   nicotine 14 mg/24hr patch Commonly known as: NICODERM CQ - dosed in mg/24 hours   nitroGLYCERIN 0.4 MG SL tablet Commonly known as: NITROSTAT   nystatin 100000 UNIT/ML suspension Commonly known as: MYCOSTATIN   ondansetron 8 MG tablet Commonly known as: Zofran   oxyCODONE 5 MG immediate release tablet Commonly known as: Oxy IR/ROXICODONE   prochlorperazine 10 MG tablet Commonly known as: COMPAZINE   rosuvastatin 10 MG tablet Commonly known as: CRESTOR       TAKE these medications    fentaNYL 25 MCG/HR Commonly known as: DURAGESIC Place 1 patch onto the skin every 3 (three) days.               Discharge Care Instructions  (From admission, onward)           Start     Ordered    0000  Discharge wound care:       Comments: As Above    1654           No Known Allergies Discharge Instructions     Discharge instructions   Complete by: As directed    F/u Hospice Care   Discharge wound care:   Complete by: As directed    As Above       The results of significant diagnostics from this hospitalization (including imaging, microbiology, ancillary and laboratory) are listed below for reference.    Significant Diagnostic Studies: DG Chest Port 1 View  Result Date:  CLINICAL DATA:  Acute hypoxic respiratory failure. EXAM: PORTABLE CHEST 1 VIEW COMPARISON:  06/23/2023 FINDINGS: There is a right chest wall port a catheter with tip in the superior cavoatrial junction. A tunneled right-sided PleurX catheter is identified with tip overlying the apical portion of the right upper lobe. Unchanged  complete opacification of the right hemithorax which as noted on the CT from 06/13/2023 reflects a combination of pleural effusion and complete consolidation of the right lung. Diffuse reticular and nodular interstitial opacities noted throughout the left lung which appear similar to the previous exam. IMPRESSION: 1. Unchanged complete opacification of the right hemithorax which as noted on the CT from 06/13/2023 reflects a combination of pleural effusion and complete consolidation of the right lung. 2. 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 Electronically Signed   By: Signa Kell M.D.   On:  10:59   US Abdomen Limited RUQ (LIVER/GB)  Result Date: 06/23/2023 CLINICAL DATA:  221910 Elevated LFTs 221910 EXAM: ULTRASOUND ABDOMEN LIMITED RIGHT UPPER QUADRANT COMPARISON:  PET-CT 04/19/2023 FINDINGS: Gallbladder: No gallstones or wall thickening visualized. No sonographic Murphy sign noted by sonographer. Common bile duct: Diameter: 2.3 mm.  No intrahepatic biliary ductal dilatation. Liver: No focal lesion  identified. Within normal limits in parenchymal echogenicity. Portal vein is patent on color Doppler imaging with normal direction of blood flow towards the liver. Other: Right pleural effusion. IMPRESSION: 1. Negative for gallstones or biliary dilatation. 2. Right pleural effusion. Electronically Signed   By: Corlis Leak M.D.   On: 06/23/2023 14:13   DG Chest 1 View  Result Date: 06/23/2023 CLINICAL DATA:  Chest pain EXAM: CHEST  1 VIEW COMPARISON:  06/13/2023 chest CT FINDINGS: Cliffton Asters out of the right chest, volume loss is present. Reticulonodular opacity throughout the left lung where there is a small pleural effusion. No pneumothorax. Normal heart size. Right-sided porta catheter. IMPRESSION: 1. Unchanged white out of the right chest due to pulmonary collapse and  pleural fluid. 2. Unchanged interstitial opacity in the left lung attributed to carcinomatosis and edema based on preceding chest CT. 3. No new abnormality. Electronically Signed   By: Tiburcio Pea M.D.   On: 06/23/2023 04:58   ECHOCARDIOGRAM COMPLETE  Result Date: 06/15/2023    ECHOCARDIOGRAM REPORT   Patient Name:   Charles Hickman Date of Exam: 06/15/2023 Medical Rec #:  [id number]     Height:       70.0 in Accession #:    [id number]    Weight:       150.6 lb Date of Birth:  1961/10/10     BSA:          1.850 m Patient Age:    62 years      BP:           101/68 mmHg Patient Gender: M             HR:           76 bpm. Exam Location:  [facility] Procedure: 2D Echo, Cardiac Doppler and Color Doppler Indications:     Atrial Fibrillation  History:         Patient has prior history of Echocardiogram examinations, most                  recent 01/29/2021. CAD, Arrythmias:Atrial Fibrillation; Risk                  Factors:Hypertension, Diabetes, Dyslipidemia, Current Smoker                  and Sleep Apnea. Lung CA, Pleural effusion.  Sonographer:     Mikki Harbor Referring Phys:  [id number] Judithe Modest Diagnosing Phys: Debbe Odea MD  Sonographer Comments: Technically challenging study due to limited acoustic windows, no parasternal window, no apical window and suboptimal subcostal window. Image acquisition challenging due to patient body habitus. IMPRESSIONS  1. Consider repeat study, could not visualize any cardiac chamber.. Left ventricular ejection fraction, by estimation, is unable to assess%. The left ventricle has cannot be obtained function. Left ventricular endocardial border not optimally defined to  evaluate regional wall motion. The left ventricular internal cavity size was not visualized. Left ventricular diastolic function could not be evaluated.  2. Right ventricular systolic function grossly normal. The right ventricular size is not well visualized.  3. The mitral valve was not well visualized. No  evidence of mitral valve regurgitation.  4. The aortic valve was not well visualized. Aortic valve regurgitation is not visualized. FINDINGS  Left Ventricle: Consider repeat study, could not visualize any cardiac chamber. Left ventricular ejection fraction, by estimation, is unable to assess%. The left ventricle has cannot be obtained function. Left ventricular endocardial border not optimally defined  to evaluate regional wall motion. The left ventricular internal cavity size was not visualized. Suboptimal image quality limits for assessment of left ventricular hypertrophy. Left ventricular diastolic function could not be evaluated. Right Ventricle: The right ventricular size is not well visualized. Right vetricular wall thickness was not assessed. Right ventricular systolic function grossly normal. Left Atrium: Left atrial size was not well visualized. Right Atrium: Right atrial size was not well visualized. Pericardium: The pericardium was not well visualized. Mitral Valve: The mitral valve was not well visualized. No evidence of mitral valve regurgitation. Tricuspid Valve: The tricuspid valve is grossly normal. Tricuspid valve regurgitation is mild. Aortic Valve: The aortic valve was not well visualized. Aortic valve regurgitation is not visualized. Pulmonic Valve: The pulmonic valve was not well visualized. Pulmonic valve regurgitation is not visualized. Aorta: The aortic root was not well visualized. IAS/Shunts: The interatrial septum was not well visualized.  TRICUSPID VALVE TR Peak grad:   34.8 mmHg TR Vmax:        295.00 cm/s Debbe Odea MD Electronically signed by Debbe Odea MD Signature Date/Time: 06/15/2023/5:17:59 PM    Final    CT Angio Chest Pulmonary Embolism (PE) W or WO Contrast  Result Date: 06/13/2023 CLINICAL DATA:  Past medical history of stage IV adenocarcinoma of the lung with metastases to the bone admitted on 06/13/2023 with pneumonia. Pleural effusion, known or suspected. PE  suspected EXAM: CT ANGIOGRAPHY CHEST WITH CONTRAST TECHNIQUE: Multidetector CT imaging of the chest was performed using the standard protocol during bolus administration of intravenous contrast. Multiplanar CT image reconstructions and MIPs were obtained to evaluate the vascular anatomy. RADIATION DOSE REDUCTION: This exam was performed according to the departmental dose-optimization program which includes automated exposure control, adjustment of the mA and/or kV according to patient size and/or use of iterative reconstruction technique. CONTRAST:  75mL OMNIPAQUE IOHEXOL 350 MG/ML SOLN COMPARISON:  Chest radiographs 06/13/2023 and PET/CT 04/19/2023 FINDINGS: Cardiovascular: No acute pulmonary embolism. There is abrupt tapering of the right middle lobe pulmonary artery similar to 03/31/2023. Normal heart size. No pericardial effusion. Coronary artery and aortic atherosclerotic calcification. Right chest wall Port-A-Cath. Mediastinum/Nodes: Unremarkable esophagus. Right paratracheal lymph nodes measuring 12 (4/35) and 13 mm (4/44), respectively. Prevascular node on 4/39 measures 8 mm. Left supraclavicular lymphadenopathy measuring up 10 mm on 04/10. These are similar to 04/19/2023. Lungs/Pleura: Layering fluid in the esophagus extending into the right mainstem bronchus which is completely opacified. Complete atelectasis of the right lung. Large right pleural effusion. There is some ill-defined pleural nodularity similar to PET/CT 04/19/2023. Right chest tube. Moderate left pleural effusion associated atelectasis. Diffuse nodular interlobular septal thickening and bronchial wall thickening in the left lung compatible with lymphangitic carcinomatosis. Upper Abdomen: No acute abnormality. 2.2 cm mass in the left adrenal gland similar to prior and compatible with metastasis. Musculoskeletal: Multiple vertebral body osseous metastases. Left fifth rib osseous metastasis anteriorly. Review of the MIP images confirms the  above findings. IMPRESSION: 1. No acute pulmonary embolism. Abrupt tapering of the right middle lobe pulmonary artery similar to 03/31/2023. 2. Complete atelectasis of the right lung. Large right pleural effusion with ill-defined pleural nodularity similar to PET/CT 04/19/2023. 3. Moderate left pleural effusion with associated atelectasis. 4. Diffuse nodular interlobular septal thickening and bronchial wall thickening in the left lung compatible with lymphangitic carcinomatosis. 5. Layering fluid in the lower trachea incompletely occluding the right mainstem bronchus. 6. Osseous, mediastinal lymph node, and left adrenal gland metastases Aortic Atherosclerosis (ICD10-I70.0). Electronically Signed   By: Joselyn Glassman  Stutzman M.D.   On: 06/13/2023 22:39   DG Chest Port 1 View  Result Date: 06/13/2023 CLINICAL DATA:  Shortness of breath and increasing weakness EXAM: PORTABLE CHEST 1 VIEW COMPARISON:  Chest radiograph dated 05/18/2023 FINDINGS: Right chest wall port tip projects over the superior cavoatrial junction. Right pleural catheter tip projects over the right apex. Similar asymmetric opacification of the right lung with volume loss resulting in rightward mediastinal shift. Similar interstitial and nodular opacities of the left lung. Presumed large left pleural effusion. Heart borders are obscured. No acute osseous abnormality. IMPRESSION: 1. Similar asymmetric opacification of the right lung with volume loss resulting in rightward mediastinal shift. 2. Similar interstitial and nodular opacities of the left lung. 3. Presumed large left pleural effusion. Electronically Signed   By: Agustin Cree M.D.   On: 06/13/2023 17:30   US Venous Img Upper Uni Left  Result Date: 06/07/2023 CLINICAL DATA:  Left upper extremity edema. EXAM: LEFT UPPER EXTREMITY VENOUS DOPPLER ULTRASOUND TECHNIQUE: Gray-scale sonography with graded compression, as well as color Doppler and duplex ultrasound were performed to evaluate the upper  extremity deep venous system from the level of the subclavian vein and including the jugular, axillary, basilic, radial, ulnar and upper cephalic vein. Spectral Doppler was utilized to evaluate flow at rest and with distal augmentation maneuvers. COMPARISON:  None Available. FINDINGS: Contralateral Subclavian Vein: Respiratory phasicity is normal and symmetric with the symptomatic side. No evidence of thrombus. Normal compressibility. Internal Jugular Vein: No evidence of thrombus. Normal compressibility, respiratory phasicity and response to augmentation. Subclavian Vein: No evidence of thrombus. Normal compressibility, respiratory phasicity and response to augmentation. Axillary Vein: No evidence of thrombus. Normal compressibility, respiratory phasicity and response to augmentation. Cephalic Vein: No evidence of thrombus. Normal compressibility, respiratory phasicity and response to augmentation. Basilic Vein: No evidence of thrombus. Normal compressibility, respiratory phasicity and response to augmentation. Brachial Veins: No evidence of thrombus. Normal compressibility, respiratory phasicity and response to augmentation. Radial Veins: No evidence of thrombus. Normal compressibility, respiratory phasicity and response to augmentation. Ulnar Veins: No evidence of thrombus. Normal compressibility, respiratory phasicity and response to augmentation. Venous Reflux:  None visualized. Other Findings:  None visualized. IMPRESSION: No evidence of DVT within the LEFT upper extremity. Electronically Signed   By: Aram Candela M.D.   On: 06/07/2023 18:32    Microbiology: Recent Results (from the past 240 hour(s))  Respiratory (~20 pathogens) panel by PCR     Status: None   Collection Time: 06/16/23  4:32 AM   Specimen: Nasopharyngeal Swab; Respiratory  Result Value Ref Range Status   Adenovirus NOT DETECTED NOT DETECTED Final   Coronavirus 229E NOT DETECTED NOT DETECTED Final    Comment: (NOTE) The  Coronavirus on the Respiratory Panel, DOES NOT test for the novel  Coronavirus (2019 nCoV)    Coronavirus HKU1 NOT DETECTED NOT DETECTED Final   Coronavirus NL63 NOT DETECTED NOT DETECTED Final   Coronavirus OC43 NOT DETECTED NOT DETECTED Final   Metapneumovirus NOT DETECTED NOT DETECTED Final   Rhinovirus / Enterovirus NOT DETECTED NOT DETECTED Final   Influenza A NOT DETECTED NOT DETECTED Final   Influenza B NOT DETECTED NOT DETECTED Final   Parainfluenza Virus 1 NOT DETECTED NOT DETECTED Final   Parainfluenza Virus 2 NOT DETECTED NOT DETECTED Final   Parainfluenza Virus 3 NOT DETECTED NOT DETECTED Final   Parainfluenza Virus 4 NOT DETECTED NOT DETECTED Final   Respiratory Syncytial Virus NOT DETECTED NOT DETECTED Final   Bordetella pertussis NOT  DETECTED NOT DETECTED Final   Bordetella Parapertussis NOT DETECTED NOT DETECTED Final   Chlamydophila pneumoniae NOT DETECTED NOT DETECTED Final   Mycoplasma pneumoniae NOT DETECTED NOT DETECTED Final    Comment: Performed at [facility], [address], [place], [place] [address]  Culture, blood (routine x 2)     Status: None (Preliminary result)   Collection Time: 06/23/23  5:02 AM   Specimen: BLOOD  Result Value Ref Range Status   Specimen Description BLOOD  RAC  Final   Special Requests   Final    BOTTLES DRAWN AEROBIC AND ANAEROBIC Blood Culture results may not be optimal due to an excessive volume of blood received in culture bottles   Culture   Final    NO GROWTH 2 DAYS Performed at [facility], [address], [place], [place] [address]    Report Status PENDING  Incomplete  Culture, blood (routine x 2)     Status: None (Preliminary result)   Collection Time: 06/23/23  5:02 AM   Specimen: BLOOD  Result Value Ref Range Status   Specimen Description BLOOD  RFA  Final   Special Requests BLOOD Blood Culture adequate volume  Final   Culture   Final    NO GROWTH 2 DAYS Performed at [facility], [address], [place], [place] [address]    Report Status PENDING  Incomplete  Resp panel by RT-PCR (RSV, Flu A&B, Covid) Anterior Nasal Swab     Status: None   Collection Time: 06/23/23  6:19 AM   Specimen: Anterior Nasal Swab  Result Value Ref Range Status   SARS Coronavirus 2 by RT PCR NEGATIVE NEGATIVE Final    Comment: (NOTE) SARS-CoV-2 target nucleic acids are NOT DETECTED.  The SARS-CoV-2 RNA is generally detectable in upper respiratory specimens during the acute phase of infection. The lowest concentration of SARS-CoV-2 viral copies this assay can detect is 138 copies/mL. A negative result does not preclude SARS-Cov-2 infection and should not be used as the sole basis for treatment or other patient management decisions. A negative result may occur with  improper specimen collection/handling, submission of specimen other than nasopharyngeal swab, presence of viral mutation(s) within the areas targeted by this assay, and inadequate number of viral copies(<138 copies/mL). A negative result must be combined with clinical observations, patient history, and epidemiological information. The expected result is Negative.  Fact Sheet for Patients:  BloggerCourse.com  Fact Sheet for Healthcare Providers:  SeriousBroker.it  This test is no t yet approved or cleared by the [place] FDA and  has been authorized for detection and/or diagnosis of SARS-CoV-2 by FDA under an Emergency Use Authorization (EUA). This EUA will remain  in effect (meaning this test can be used) for the duration of the COVID-19 declaration under Section 564(b)(1) of the Act, 21 U.S.C.section 360bbb-3(b)(1), unless the authorization is terminated  or revoked sooner.       Influenza A by PCR NEGATIVE NEGATIVE Final   Influenza B by PCR NEGATIVE NEGATIVE Final    Comment: (NOTE) The Xpert Xpress SARS-CoV-2/FLU/RSV plus assay is intended as an aid in the  diagnosis of influenza from Nasopharyngeal swab specimens and should not be used as a sole basis for treatment. Nasal washings and aspirates are unacceptable for Xpert Xpress SARS-CoV-2/FLU/RSV testing.  Fact Sheet for Patients: BloggerCourse.com  Fact Sheet for Healthcare Providers: SeriousBroker.it  This test is not yet approved or cleared by the [place] and has been authorized for  detection and/or diagnosis of SARS-CoV-2 by FDA under an Emergency Use Authorization (EUA). This EUA will remain in effect (meaning this test can be used) for the duration of the COVID-19 declaration under Section 564(b)(1) of the Act, 21 U.S.C. section 360bbb-3(b)(1), unless the authorization is terminated or revoked.     Resp Syncytial Virus by PCR NEGATIVE NEGATIVE Final    Comment: (NOTE) Fact Sheet for Patients: BloggerCourse.com  Fact Sheet for Healthcare Providers: SeriousBroker.it  This test is not yet approved or cleared by the [place] FDA and has been authorized for detection and/or diagnosis of SARS-CoV-2 by FDA under an Emergency Use Authorization (EUA). This EUA will remain in effect (meaning this test can be used) for the duration of the COVID-19 declaration under Section 564(b)(1) of the Act, 21 U.S.C. section 360bbb-3(b)(1), unless the authorization is terminated or revoked.  Performed at [facility], [address] [place], [place] [address]   MRSA Next Gen by PCR, Nasal     Status: None   Collection Time: 06/23/23 10:17 AM   Specimen: Nasal Mucosa; Nasal Swab  Result Value Ref Range Status   MRSA by PCR Next Gen NOT DETECTED NOT DETECTED Final    Comment: (NOTE) The GeneXpert MRSA Assay (FDA approved for NASAL specimens only), is one component of a comprehensive MRSA colonization surveillance program. It is not intended to diagnose MRSA infection  nor to guide or monitor treatment for MRSA infections. Test performance is not FDA approved in patients less than 71 years old. Performed at [facility], [address] [place], [place] [address]      Labs: CBC: Recent Labs  Lab 06/19/23 0700 06/23/23 0426 06/23/23 0856 06/24/23 0453  0527  WBC 17.0* 20.6* 23.6* 26.3* 24.1*  NEUTROABS  --   --  21.2*  --   --   HGB 12.4* 12.1* 10.2* 10.7* 10.1*  HCT 37.9* 38.7* 32.8* 33.9* 33.1*  MCV 87.1 90.6 91.4 90.6 93.5  PLT 122* 138* 142* 151 127*   Basic Metabolic Panel: Recent Labs  Lab 06/19/23 0700 06/23/23 0426 06/24/23 0453  0527  NA 132* 134* 136 136  K 3.4* 3.5 3.2* 4.5  CL 95* 94* 100 99  CO2 31 26 29 29   GLUCOSE 91 164* 181* 172*  BUN 9 10 8 9   CREATININE <0.30* 0.46* 0.36* 0.37*  CALCIUM 7.9* 7.6* 7.7* 7.9*  MG  --   --  1.8 2.1  PHOS 2.5  --  3.1 2.8   Liver Function Tests: Recent Labs  Lab 06/19/23 0700 06/23/23 0426 06/24/23 0453  0527  AST  --  27 17 16   ALT  --  14 13 12   ALKPHOS  --  199* 183* 181*  BILITOT  --  0.9 0.8 0.9  PROT  --  5.2* 5.6* 5.5*  ALBUMIN 2.2* 2.1* 2.3* 2.2*   Recent Labs  Lab 06/23/23 0426  LIPASE 20   No results for input(s): "AMMONIA" in the last 168 hours. Cardiac Enzymes: No results for input(s): "CKTOTAL", "CKMB", "CKMBINDEX", "TROPONINI" in the last 168 hours. BNP (last 3 results) Recent Labs    06/13/23 1756 06/23/23 0426  BNP 586.1* 462.4*   CBG: Recent Labs  Lab 06/24/23 1129 06/24/23 1619 06/24/23 2133  0745  1139  GLUCAP 169* 161* 177* 170* 175*    Time spent: 35 minutes  Signed:  Gillis Santa  [facility]   4:55 PM

## 2023-07-21 NOTE — TOC Transition Note (Signed)
Transition of Care [facility]) - CM/SW Discharge Note   Patient Details  Name: Charles Hickman MRN: [id number] Date of Birth: [birth year]  [facility]) CM/SW Contact:  Liliana Cline, LCSW Phone Number: , 4:35 PM   Clinical Narrative:    Patient to discharge to [facility] in [place]. EMS paperwork has been completed. Diannia Ruder with [facility] to call EMS when ready for transport.    Final next level of care: Hospice Medical Facility Barriers to Discharge: Barriers Resolved   Patient Goals and CMS Choice      Discharge Placement                         Discharge Plan and Services Additional resources added to the After Visit Summary for       Post Acute Care Choice: Home Health                               Social Determinants of Health (SDOH) Interventions SDOH Screenings   Food Insecurity: No Food Insecurity (06/23/2023)  Housing: Low Risk  (06/23/2023)  Transportation Needs: No Transportation Needs (06/23/2023)  Utilities: Not At Risk (06/23/2023)  Depression (PHQ2-9): Low Risk  (12/29/2020)  Financial Resource Strain: Low Risk  (04/06/2023)  Tobacco Use: Medium Risk (06/23/2023)     Readmission Risk Interventions    06/23/2023   11:50 AM  Readmission Risk Prevention Plan  Transportation Screening Complete  PCP or Specialist Appt within 3-5 Days Complete  HRI or Home Care Consult Complete  Social Work Consult for Recovery Care Planning/Counseling Complete  Palliative Care Screening Complete  Medication Review Oceanographer) Referral to Pharmacy

## 2023-07-21 NOTE — Progress Notes (Signed)
Received request from Alfonso Ramus, LCSW, TOC,for hospice consult to discuss hospice options. Spoke with [name], wife and Durene Cal, patient's son- to initiate education related to hospice philosophy, services, and team approach to care. Patient/family verbalized understanding of information given.  They are leaning towards Hospice Home (IPU) vs. Home with hospice.   Please send signed and completed DNR with patient at discharge.  [facility] information and contact numbers given to Name person who received.  Continued collaboration with patient/family and [facility] staff is ongoing through final disposition.  Please call with any hospice related questions or concerns.  Thank you for the opportunity to participate in this patient's care.    Norris Cross, RN Nurse Liaison [phone]

## 2023-07-21 NOTE — Progress Notes (Signed)
At approximately 1030 patient noted to have increased work of breathing with oxygen saturations in the mid 70s to mid 80s.  Dr. Lucianne Muss notified and RT at bedside.  Patient changed over to HFNC at 10L with no improvement and at this time patient's respirations were fluctuating in the mid 70s to low 80s.  Dr. Lucianne Muss at bedside and patient placed on bipap by RT.  This nurse reached out to patient's wife to come in and speak with doctor.  Son at bedside and patient's wife joined shortly after, family met with palliative care and decided to move forward with comfort care.

## 2023-07-21 NOTE — Progress Notes (Signed)
EMS to patient's room, patient transported to hospice home on stretcher, family obtained all belongings.

## 2023-07-21 NOTE — Consult Note (Signed)
Consultation Note Date:    Patient Name: Charles Hickman  DOB: [birth year]  MRN: [id number]  Age / Sex: 62 y.o., male  PCP: Charles Baseman, MD Referring Physician: Gillis Santa, MD  Reason for Consultation: Establishing goals of care   HPI/Brief Hospital Course: 62 y.o. 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 Fib on Eliquis, HFpEF, T2DM and COPD admitted from home on 06/23/2023 with shortness of breath, found to be in A. Fib with RVR with transient hypotension.  Admitted to ICU, started on Amiodarone gtt, blood pressure stabilized on arrival to ICU-did not require vasopressor support  Noted 3 IP admits within last 6 months  Last admission 06/13/2023-06/20/2023 for acute on chronic hypoxic respiratory failure   Palliative medicine was consulted for assisting with goals of care conversations  Subjective:  Extensive chart review has been completed prior to meeting patient including labs, vital signs, imaging, progress notes, orders, and available advanced directive documents from current and previous encounters.  Rounded in ICU, per nursing staff, no family at bedside and wife typically visits later in the morning-plan to return to bedside for consult once wife arrived.  Called to bedside by CCU staff due to acute respiratory decompensation. Primary team and nursing staff speaking with son-Charles Hickman at bedside, transitioned to DNR/DNI. Wife-Charles Hickman in route to hospital. Required bipap support, repeat CXR with worsening fluid collection per CCU team as a result of advanced cancer.  Met with wife-Charles Hickman, son-Charles Hickman and other family members outside of ICU in conference room.  Introduced myself as a Publishing rights manager as a member of the palliative care team. Explained palliative medicine is specialized medical care for  people living with serious illness. It focuses on providing relief from the symptoms and stress of a serious illness. The goal is to improve quality of life for both the patient and the family.   Charles Hickman and Charles Hickman acknowledged being familiar with Palliative Medicine. We reviewed events that occurred earlier resulting in Charles Hickman being placed on bipap. Charles Hickman confirms DNR/DNI status.  Both Charles Hickman and Gamaliel share they no longer wish to see Charles Hickman suffer. Charles Hickman shares she and Charles Hickman had a conversation yesterday, she shares it was brief but Charles Hickman expressed to her he was also tired of suffering and wished to be free from pain. Charles Hickman shares she did not realize things would progress this quickly but understands the severity of his disease.  We discussed transition to comfort measures. Charles Hickman would no longer receive aggressive medical interventions such as continuous vital signs, lab work, radiology testing, or medications not focused on comfort. All care would focus on how the patient is looking and feeling. This would include management of any symptoms that may cause discomfort, pain, shortness of breath, cough, nausea, agitation, anxiety, and/or secretions etc. Symptoms would be managed with medications and other non-pharmacological interventions such as spiritual support if requested, repositioning, music therapy, or therapeutic listening. Family verbalized understanding and appreciation.   Both Charles Hickman and [place] agree and wish to proceed with transition to comfort measures. They request time to keep bipap on to allow a few more friends and family to visit. They agree to initiating all other comfort measures including comfort medications to provide symptom relief. On return from room, Charles Hickman adamantly requesting and attempting to remove bipap, at that per family request, bipap removed.  Charles Hickman again adamantly requesting to leave the hospital  and return home. Spoke with family,  emphasis placed on pending stability, returning home with hospice versus hospice IPU MAY be appropriate. They agreed to meeting with hospice liaison to discuss eligibility and services offered.  Family met with hospice liaison-have opted for IPU placement. At this time, HR and oxygen saturations stable, no signs of distress. Family aware and accepting of risk of Charles Hickman passing during transport.  All questions/concerns addressed. Emotional support provided to patient/family/support persons. PMT will continue to follow and support patient as needed.  Objective: Primary Diagnoses: Present on Admission:  Atrial fibrillation with RVR (HCC)   Vital Signs: BP 114/79   Pulse 95   Temp 97.6 F (36.4 C) (Oral)   Resp 18   Ht 5\' 10"  (1.778 m)   Wt 67.2 kg   SpO2 91%   BMI 21.26 kg/m  Pain Scale: 0-10   Pain Score: Asleep   IO: Intake/output summary:  Intake/Output Summary (Last 24 hours) at  1618 Last data filed at  1300 Gross per 24 hour  Intake 549.92 ml  Output 500 ml  Net 49.92 ml    LBM: Last BM Date :  Baseline Weight: Weight: 70 kg Most recent weight: Weight: 67.2 kg      Assessment and Plan  SUMMARY OF RECOMMENDATIONS   DNR/DNI Comfort measures: IV Morphine, Ativan and Haldol as needed to maintain comfort Pending transfer to hospice IPU per family request  Discussed With: Nursing staff, primary team and [facility] liaison.   Thank you for this consult and allowing Palliative Medicine to participate in the care of Charles Hickman. Palliative medicine will continue to follow and assist as needed.   Time Total: 90 minutes  Time spent includes: Detailed review of medical records (labs, imaging, vital signs), medically appropriate exam (mental status, respiratory, cardiac, skin), discussed with treatment team, counseling and educating patient, family and staff, documenting clinical information, medication management and coordination of  care.   Signed by: Charles Deed, DNP, AGNP-C Palliative Medicine    Please contact Palliative Medicine Team phone at [phone] for questions and concerns.  For individual provider: See Loretha Stapler

## 2023-07-21 NOTE — Progress Notes (Signed)
At approximately 1630 this nurse notified that patient was approved for a bed at hospice house and family aware.  Report given to receiving nurse, Sarah, awaiting EMS transport at this time.  Son continues at bedside.

## 2023-07-21 NOTE — Plan of Care (Signed)
Continuing with plan of care. 

## 2023-07-21 NOTE — TOC Progression Note (Addendum)
Transition of Care [facility]) - Progression Note    Patient Details  Name: Charles Hickman MRN: [id number] Date of Birth: 09-29-1961  [facility]) CM/SW Contact  Liliana Cline, LCSW Phone Number: , 3:51 PM  Clinical Narrative:    Notified by Diannia Ruder with [facility] that they are working patient up for hospice options. Per Diannia Ruder and TOC notes from last month, patient was referred to [facility] for Palliative Care prior to this hospital stay.  TOC will continue to follow.      Barriers to Discharge: Continued Medical Work up  Expected Discharge Plan and Services     Post Acute Care Choice: Home Health                                         Social Determinants of Health (SDOH) Interventions SDOH Screenings   Food Insecurity: No Food Insecurity (06/23/2023)  Housing: Low Risk  (06/23/2023)  Transportation Needs: No Transportation Needs (06/23/2023)  Utilities: Not At Risk (06/23/2023)  Depression (PHQ2-9): Low Risk  (12/29/2020)  Financial Resource Strain: Low Risk  (04/06/2023)  Tobacco Use: Medium Risk (06/23/2023)    Readmission Risk Interventions    06/23/2023   11:50 AM  Readmission Risk Prevention Plan  Transportation Screening Complete  PCP or Specialist Appt within 3-5 Days Complete  HRI or Home Care Consult Complete  Social Work Consult for Recovery Care Planning/Counseling Complete  Palliative Care Screening Complete  Medication Review Oceanographer) Referral to Pharmacy

## 2023-07-21 DEATH — deceased

## 2023-08-15 ENCOUNTER — Telehealth: Payer: Managed Care, Other (non HMO) | Admitting: Hospice and Palliative Medicine
